# Patient Record
Sex: Female | Born: 1950 | Race: Black or African American | Hispanic: No | Marital: Married | Smoking: Never smoker
Health system: Southern US, Community
[De-identification: ages and names within clinical notes are randomized; demographics above are authoritative.]

## PROBLEM LIST (undated history)

## (undated) DIAGNOSIS — Z87442 Personal history of urinary calculi: Secondary | ICD-10-CM

## (undated) DIAGNOSIS — Z972 Presence of dental prosthetic device (complete) (partial): Secondary | ICD-10-CM

## (undated) DIAGNOSIS — K219 Gastro-esophageal reflux disease without esophagitis: Secondary | ICD-10-CM

## (undated) DIAGNOSIS — J189 Pneumonia, unspecified organism: Secondary | ICD-10-CM

## (undated) DIAGNOSIS — N2889 Other specified disorders of kidney and ureter: Secondary | ICD-10-CM

## (undated) DIAGNOSIS — E669 Obesity, unspecified: Secondary | ICD-10-CM

## (undated) DIAGNOSIS — E119 Type 2 diabetes mellitus without complications: Secondary | ICD-10-CM

## (undated) DIAGNOSIS — I10 Essential (primary) hypertension: Secondary | ICD-10-CM

## (undated) DIAGNOSIS — Z889 Allergy status to unspecified drugs, medicaments and biological substances status: Secondary | ICD-10-CM

## (undated) DIAGNOSIS — Z973 Presence of spectacles and contact lenses: Secondary | ICD-10-CM

## (undated) DIAGNOSIS — N201 Calculus of ureter: Secondary | ICD-10-CM

## (undated) DIAGNOSIS — C642 Malignant neoplasm of left kidney, except renal pelvis: Secondary | ICD-10-CM

## (undated) DIAGNOSIS — C801 Malignant (primary) neoplasm, unspecified: Secondary | ICD-10-CM

## (undated) DIAGNOSIS — M1990 Unspecified osteoarthritis, unspecified site: Secondary | ICD-10-CM

## (undated) DIAGNOSIS — D649 Anemia, unspecified: Secondary | ICD-10-CM

---

## 1979-06-24 HISTORY — PX: TOTAL ABDOMINAL HYSTERECTOMY W/ BILATERAL SALPINGOOPHORECTOMY: SHX83

## 1998-11-08 ENCOUNTER — Emergency Department (HOSPITAL_COMMUNITY): Admission: EM | Admit: 1998-11-08 | Discharge: 1998-11-08 | Payer: Self-pay | Admitting: Emergency Medicine

## 2001-12-04 ENCOUNTER — Encounter: Payer: Self-pay | Admitting: Internal Medicine

## 2001-12-04 ENCOUNTER — Encounter: Admission: RE | Admit: 2001-12-04 | Discharge: 2001-12-04 | Payer: Self-pay | Admitting: Internal Medicine

## 2001-12-25 ENCOUNTER — Other Ambulatory Visit: Admission: RE | Admit: 2001-12-25 | Discharge: 2001-12-25 | Payer: Self-pay | Admitting: Internal Medicine

## 2004-01-26 ENCOUNTER — Emergency Department (HOSPITAL_COMMUNITY): Admission: EM | Admit: 2004-01-26 | Discharge: 2004-01-26 | Payer: Self-pay | Admitting: Emergency Medicine

## 2004-04-15 ENCOUNTER — Observation Stay (HOSPITAL_COMMUNITY): Admission: AC | Admit: 2004-04-15 | Discharge: 2004-04-16 | Payer: Self-pay

## 2004-05-03 ENCOUNTER — Emergency Department (HOSPITAL_COMMUNITY): Admission: EM | Admit: 2004-05-03 | Discharge: 2004-05-04 | Payer: Self-pay | Admitting: Emergency Medicine

## 2005-11-06 ENCOUNTER — Emergency Department (HOSPITAL_COMMUNITY): Admission: EM | Admit: 2005-11-06 | Discharge: 2005-11-07 | Payer: Self-pay | Admitting: Emergency Medicine

## 2008-01-27 ENCOUNTER — Emergency Department (HOSPITAL_COMMUNITY): Admission: EM | Admit: 2008-01-27 | Discharge: 2008-01-27 | Payer: Self-pay | Admitting: Family Medicine

## 2008-11-22 ENCOUNTER — Emergency Department (HOSPITAL_COMMUNITY): Admission: EM | Admit: 2008-11-22 | Discharge: 2008-11-22 | Payer: Self-pay | Admitting: Emergency Medicine

## 2008-12-02 ENCOUNTER — Emergency Department (HOSPITAL_COMMUNITY): Admission: EM | Admit: 2008-12-02 | Discharge: 2008-12-02 | Payer: Self-pay | Admitting: Emergency Medicine

## 2009-02-08 ENCOUNTER — Encounter: Admission: RE | Admit: 2009-02-08 | Discharge: 2009-02-08 | Payer: Self-pay | Admitting: Internal Medicine

## 2009-10-20 ENCOUNTER — Encounter: Admission: RE | Admit: 2009-10-20 | Discharge: 2009-10-20 | Payer: Self-pay | Admitting: Internal Medicine

## 2010-02-28 ENCOUNTER — Emergency Department (HOSPITAL_COMMUNITY): Admission: EM | Admit: 2010-02-28 | Discharge: 2010-02-28 | Payer: Self-pay | Admitting: Emergency Medicine

## 2010-03-08 ENCOUNTER — Emergency Department (HOSPITAL_COMMUNITY): Admission: EM | Admit: 2010-03-08 | Discharge: 2010-03-08 | Payer: Self-pay | Admitting: Family Medicine

## 2010-11-11 ENCOUNTER — Emergency Department (HOSPITAL_COMMUNITY)
Admission: EM | Admit: 2010-11-11 | Discharge: 2010-11-11 | Payer: Self-pay | Source: Home / Self Care | Admitting: Emergency Medicine

## 2011-02-07 LAB — POCT I-STAT, CHEM 8
BUN: 23 mg/dL (ref 6–23)
Creatinine, Ser: 1 mg/dL (ref 0.4–1.2)
Glucose, Bld: 155 mg/dL — ABNORMAL HIGH (ref 70–99)
Potassium: 4.1 mEq/L (ref 3.5–5.1)
Sodium: 134 mEq/L — ABNORMAL LOW (ref 135–145)

## 2013-07-12 ENCOUNTER — Emergency Department (HOSPITAL_COMMUNITY): Payer: Self-pay

## 2013-07-12 ENCOUNTER — Emergency Department (HOSPITAL_COMMUNITY)
Admission: EM | Admit: 2013-07-12 | Discharge: 2013-07-12 | Disposition: A | Discharge status: Home / Self Care | Payer: Self-pay | Attending: Emergency Medicine | Admitting: Emergency Medicine

## 2013-07-12 ENCOUNTER — Encounter (HOSPITAL_COMMUNITY): Payer: Self-pay | Admitting: Emergency Medicine

## 2013-07-12 DIAGNOSIS — R112 Nausea with vomiting, unspecified: Secondary | ICD-10-CM | POA: Insufficient documentation

## 2013-07-12 DIAGNOSIS — N200 Calculus of kidney: Secondary | ICD-10-CM | POA: Insufficient documentation

## 2013-07-12 LAB — COMPREHENSIVE METABOLIC PANEL
Alkaline Phosphatase: 80 U/L (ref 39–117)
BUN: 23 mg/dL (ref 6–23)
Calcium: 9.4 mg/dL (ref 8.4–10.5)
Creatinine, Ser: 0.96 mg/dL (ref 0.50–1.10)
GFR calc Af Amer: 71 mL/min — ABNORMAL LOW (ref >90)
Glucose, Bld: 200 mg/dL — ABNORMAL HIGH (ref 70–99)
Total Protein: 8 g/dL (ref 6.0–8.3)

## 2013-07-12 LAB — CBC WITH DIFFERENTIAL/PLATELET
Basophils Relative: 0 % (ref 0–1)
Eosinophils Absolute: 0.1 10*3/uL (ref 0.0–0.7)
Eosinophils Relative: 0 % (ref 0–5)
Hemoglobin: 11.5 g/dL — ABNORMAL LOW (ref 12.0–15.0)
Lymphs Abs: 2 10*3/uL (ref 0.7–4.0)
MCH: 28.1 pg (ref 26.0–34.0)
MCHC: 32.6 g/dL (ref 30.0–36.0)
MCV: 86.3 fL (ref 78.0–100.0)
Monocytes Absolute: 0.4 10*3/uL (ref 0.1–1.0)
Monocytes Relative: 2 % — ABNORMAL LOW (ref 3–12)
RBC: 4.09 MIL/uL (ref 3.87–5.11)

## 2013-07-12 LAB — URINALYSIS, ROUTINE W REFLEX MICROSCOPIC
Bilirubin Urine: NEGATIVE
Leukocytes, UA: NEGATIVE
Nitrite: NEGATIVE
Specific Gravity, Urine: 1.028 (ref 1.005–1.030)
Urobilinogen, UA: 0.2 mg/dL (ref 0.0–1.0)

## 2013-07-12 LAB — LIPASE, BLOOD: Lipase: 49 U/L (ref 11–59)

## 2013-07-12 LAB — URINE MICROSCOPIC-ADD ON

## 2013-07-12 MED ORDER — NAPROXEN 500 MG PO TABS: 500.0000 mg | ORAL_TABLET | Freq: Two times a day (BID) | ORAL | Status: DC

## 2013-07-12 MED ORDER — KETOROLAC TROMETHAMINE 30 MG/ML IJ SOLN
INTRAMUSCULAR | Status: AC
  Filled 2013-07-12: qty 1

## 2013-07-12 MED ORDER — HYDROMORPHONE HCL PF 1 MG/ML IJ SOLN
1.0000 mg | Freq: Once | INTRAMUSCULAR | Status: AC
  Administered 2013-07-12: 1 mg via INTRAVENOUS
  Filled 2013-07-12: qty 1

## 2013-07-12 MED ORDER — MORPHINE SULFATE 4 MG/ML IJ SOLN: 4.0000 mg | Freq: Once | INTRAMUSCULAR | Status: DC

## 2013-07-12 MED ORDER — KETOROLAC TROMETHAMINE 30 MG/ML IJ SOLN
30.0000 mg | Freq: Once | INTRAMUSCULAR | Status: AC
  Administered 2013-07-12: 30 mg via INTRAVENOUS

## 2013-07-12 MED ORDER — ONDANSETRON 8 MG PO TBDP
8.0000 mg | ORAL_TABLET | Freq: Once | ORAL | Status: AC
  Administered 2013-07-12: 8 mg via ORAL
  Filled 2013-07-12: qty 1

## 2013-07-12 MED ORDER — ONDANSETRON HCL 4 MG/2ML IJ SOLN
4.0000 mg | Freq: Once | INTRAMUSCULAR | Status: AC
  Administered 2013-07-12: 4 mg via INTRAVENOUS

## 2013-07-12 MED ORDER — PROMETHAZINE HCL 25 MG PO TABS: 25.0000 mg | ORAL_TABLET | Freq: Four times a day (QID) | ORAL | Status: DC | PRN

## 2013-07-12 MED ORDER — MORPHINE SULFATE 4 MG/ML IJ SOLN
INTRAMUSCULAR | Status: AC
  Filled 2013-07-12: qty 1

## 2013-07-12 MED ORDER — SODIUM CHLORIDE 0.9 % IV BOLUS (SEPSIS)
1000.0000 mL | Freq: Once | INTRAVENOUS | Status: AC
  Administered 2013-07-12: 1000 mL via INTRAVENOUS

## 2013-07-12 MED ORDER — SIMETHICONE 40 MG/0.6ML PO SUSP (UNIT DOSE)
40.0000 mg | Freq: Once | ORAL | Status: AC
  Administered 2013-07-12: 40 mg via ORAL
  Filled 2013-07-12: qty 0.6

## 2013-07-12 MED ORDER — HYDROCODONE-ACETAMINOPHEN 5-325 MG PO TABS: 2.0000 | ORAL_TABLET | ORAL | Status: DC | PRN

## 2013-07-12 MED ORDER — ONDANSETRON HCL 4 MG/2ML IJ SOLN
INTRAMUSCULAR | Status: AC
  Filled 2013-07-12: qty 2

## 2013-07-12 MED ORDER — MORPHINE SULFATE 4 MG/ML IJ SOLN
4.0000 mg | Freq: Once | INTRAMUSCULAR | Status: AC
  Administered 2013-07-12: 4 mg via INTRAVENOUS
  Filled 2013-07-12: qty 1

## 2013-07-12 NOTE — ED Provider Notes (Signed)
Medical screening examination/treatment/procedure(s) were performed by non-physician practitioner and as supervising physician I was immediately available for consultation/collaboration.  Olivia Mackie, MD 07/12/13 [phone]

## 2013-07-12 NOTE — ED Notes (Signed)
Pt placed on 3L O2 and sat upright.  Pt saturation level at 100%

## 2013-07-12 NOTE — ED Notes (Signed)
Pt states that at 2200 she began vomiting and having bilateral LQ ABD pain. Pt believes it is gas. Pt actively vomiting during triage.

## 2013-07-12 NOTE — ED Provider Notes (Signed)
CSN: [id number]     Arrival date & time 07/12/13  0121 History   First MD Initiated Contact with Patient 07/12/13 0135     Chief Complaint  Patient presents with  . Emesis  . Abdominal Pain   (Consider location/radiation/quality/duration/timing/severity/associated sxs/prior Treatment) HPI Comments: Patient is a 62 year old female with a past medical history of diabetes and hypertension who presents with abdominal pain for the past few hours. The pain is located in her right flank and does not radiate. The pain is described as aching and severe. Patient states this feels like her gas pain but much worse. The pain started gradually and progressively worsened since the onset. No alleviating/aggravating factors. The patient has tried nothing for symptoms without relief. Associated symptoms include nausea and vomiting. Patient denies fever, headache, diarrhea, chest pain, SOB, dysuria, constipation, abnormal vaginal bleeding/discharge.      History reviewed. No pertinent past medical history. Past Surgical History  Procedure Laterality Date  . Abdominal hysterectomy    . Cesarean section     No family history on file. History  Substance Use Topics  . Smoking status: Never Smoker   . Smokeless tobacco: Never Used  . Alcohol Use: No   OB History   Grav Para Term Preterm Abortions TAB SAB Ect Mult Living                 Review of Systems  Gastrointestinal: Positive for nausea, vomiting and abdominal pain.  All other systems reviewed and are negative.    Allergies  Review of patient's allergies indicates no known allergies.  Home Medications  No current outpatient prescriptions on file. BP 196/74  Pulse 69  Temp(Src) 97.7 F (36.5 C) (Oral)  Resp 20  SpO2 95% Physical Exam  Nursing note and vitals reviewed. Constitutional: She is oriented to person, place, and time. She appears well-developed and well-nourished. No distress.  HENT:  Head: Normocephalic and atraumatic.   Eyes: Conjunctivae are normal. Pupils are equal, round, and reactive to light.  Neck: Normal range of motion.  Cardiovascular: Normal rate and regular rhythm.  Exam reveals no gallop and no friction rub.   No murmur heard. Pulmonary/Chest: Effort normal and breath sounds normal. She has no wheezes. She has no rales. She exhibits no tenderness.  Abdominal: Soft. She exhibits no distension. There is tenderness. There is no rebound and no guarding.  Right side abdominal tenderness to palpation. No focal tenderness to palpation or peritoneal signs.   Musculoskeletal: Normal range of motion.  Neurological: She is alert and oriented to person, place, and time. Coordination normal.  Speech is goal-oriented. Moves limbs without ataxia.   Skin: Skin is warm and dry.  Psychiatric: She has a normal mood and affect. Her behavior is normal.    ED Course  Procedures (including critical care time) Labs Review Labs Reviewed  CBC WITH DIFFERENTIAL - Abnormal; Notable for the following:    WBC 16.2 (*)    Hemoglobin 11.5 (*)    HCT 35.3 (*)    Neutrophils Relative % 85 (*)    Neutro Abs 13.8 (*)    Monocytes Relative 2 (*)    All other components within normal limits  COMPREHENSIVE METABOLIC PANEL - Abnormal; Notable for the following:    Sodium 132 (*)    Glucose, Bld 200 (*)    Total Bilirubin 0.2 (*)    GFR calc non Af Amer 62 (*)    GFR calc Af Amer 71 (*)  All other components within normal limits  URINALYSIS, ROUTINE W REFLEX MICROSCOPIC - Abnormal; Notable for the following:    APPearance CLOUDY (*)    Hgb urine dipstick LARGE (*)    All other components within normal limits  URINE MICROSCOPIC-ADD ON - Abnormal; Notable for the following:    Crystals URIC ACID CRYSTALS (*)    All other components within normal limits  URINE CULTURE  LIPASE, BLOOD   Imaging Review Ct Abdomen Pelvis Wo Contrast  07/12/2013   CLINICAL DATA:  Vomiting, lower abdominal pain.  EXAM: CT ABDOMEN AND  PELVIS WITHOUT CONTRAST  TECHNIQUE: Multidetector CT imaging of the abdomen and pelvis was performed following the standard protocol without intravenous contrast.  COMPARISON:  11/06/2005  FINDINGS: Coarse atelectasis or scarring posteriorly in the visualized lung bases. Unremarkable noncontrast assessment of liver, gallbladder, spleen, bilateral adrenal glands, pancreas, left kidney.  There is right hydronephrosis and ureterectasis down to the level of the ureteral orifice with no radiodense calculus or mass evident. The urinary bladder is incompletely distended without calculus. There are mild streaky inflammatory/edematous changes around the right kidney and renal collecting system and proximal ureter.  Stomach, small bowel, and colon are nondilated. No ascites. No free air. No adenopathy. Lumbar spine intact. Small umbilical hernia containing only mesenteric fat.  IMPRESSION: Right hydronephrosis and ureterectasis without calculus suggesting recent stone passage versus radiolucent obstructing calculus near the ureteral orifice. .   Electronically Signed   By: Oley Balm M.D.   On: 07/12/2013 05:19   Dg Abd Acute W/chest  07/12/2013   *RADIOLOGY REPORT*  Clinical Data: Evaluate for obstruction  ACUTE ABDOMEN SERIES (ABDOMEN 2 VIEW & CHEST 1 VIEW)  Comparison: Prior CT from 11/06/2005.  Findings: Cardiac and mediastinal silhouettes within normal limits.  Lungs are normally inflated.  Minimal patchy opacity overlying the left lung base may represent atelectasis or infiltrate.  No pulmonary edema or pleural effusion.  No pneumothorax.  Gas and stool are seen scattered within a few nondilated loops of bowel.  There is no bowel obstruction or ileus. No free intraperitoneal air.  No soft tissue mass or calcifications.  No acute osseous abnormality.  IMPRESSION: 1.  No radiographic evidence of bowel obstruction. 2.  Patchy left basilar opacity.  Atelectasis is favored.   Original Report Authenticated By:  Rise Mu, M.D.    MDM   1. Kidney stone on right side     1:36 AM Labs and urinalysis pending. Vitals stable and patient afebrile. Patient will have fluids, morphine and zofran. Acute abdominal series pending.   4:49 AM Labs show elevated WBC. Urinalysis shows large hemoglobin. I am suspicious for kidney stone at this point. Patient will have CT without contrast to rule out kidney stone. Patient continuing to have pain medication for pain control.   5:25 AM Patient has a kidney stone based on CT findings. Patient will have toradol for pain control. Patient will be discharged with pain and nausea medication with instructions to return with worsening or concerning symptoms. Vitals stable and patient afebrile.     Debbie Velasquez, [place] 07/12/13 [phone]

## 2013-07-13 LAB — URINE CULTURE: Colony Count: NO GROWTH

## 2014-01-11 ENCOUNTER — Emergency Department (HOSPITAL_COMMUNITY)
Admission: EM | Admit: 2014-01-11 | Discharge: 2014-01-12 | Disposition: A | Discharge status: Home / Self Care | Payer: Self-pay | Attending: Emergency Medicine | Admitting: Emergency Medicine

## 2014-01-11 ENCOUNTER — Encounter (HOSPITAL_COMMUNITY): Payer: Self-pay | Admitting: Emergency Medicine

## 2014-01-11 DIAGNOSIS — R209 Unspecified disturbances of skin sensation: Secondary | ICD-10-CM | POA: Insufficient documentation

## 2014-01-11 DIAGNOSIS — R609 Edema, unspecified: Secondary | ICD-10-CM | POA: Insufficient documentation

## 2014-01-11 DIAGNOSIS — Z79899 Other long term (current) drug therapy: Secondary | ICD-10-CM | POA: Insufficient documentation

## 2014-01-11 DIAGNOSIS — E119 Type 2 diabetes mellitus without complications: Secondary | ICD-10-CM | POA: Insufficient documentation

## 2014-01-11 DIAGNOSIS — I10 Essential (primary) hypertension: Secondary | ICD-10-CM | POA: Insufficient documentation

## 2014-01-11 HISTORY — DX: Essential (primary) hypertension: I10

## 2014-01-11 LAB — BASIC METABOLIC PANEL
BUN: 20 mg/dL (ref 6–23)
CHLORIDE: 102 meq/L (ref 96–112)
CO2: 29 meq/L (ref 19–32)
CREATININE: 1.11 mg/dL — AB (ref 0.50–1.10)
Calcium: 9.7 mg/dL (ref 8.4–10.5)
GFR calc Af Amer: 59 mL/min — ABNORMAL LOW (ref >90)
GFR calc non Af Amer: 51 mL/min — ABNORMAL LOW (ref >90)
Glucose, Bld: 145 mg/dL — ABNORMAL HIGH (ref 70–99)
Potassium: 3.9 mEq/L (ref 3.7–5.3)
Sodium: 143 mEq/L (ref 137–147)

## 2014-01-11 LAB — CBG MONITORING, ED: Glucose-Capillary: 138 mg/dL — ABNORMAL HIGH (ref 70–99)

## 2014-01-11 LAB — D-DIMER, QUANTITATIVE: D-Dimer, Quant: 0.36 ug/mL-FEU (ref 0.00–0.48)

## 2014-01-11 LAB — CBC
HEMATOCRIT: 33.8 % — AB (ref 36.0–46.0)
Hemoglobin: 10.7 g/dL — ABNORMAL LOW (ref 12.0–15.0)
MCH: 28 pg (ref 26.0–34.0)
MCHC: 31.7 g/dL (ref 30.0–36.0)
MCV: 88.5 fL (ref 78.0–100.0)
Platelets: 364 10*3/uL (ref 150–400)
RBC: 3.82 MIL/uL — ABNORMAL LOW (ref 3.87–5.11)
RDW: 13.1 % (ref 11.5–15.5)
WBC: 10.8 10*3/uL — ABNORMAL HIGH (ref 4.0–10.5)

## 2014-01-11 NOTE — ED Notes (Signed)
Pt ambulatory to exam room with steady gait.  

## 2014-01-11 NOTE — ED Notes (Signed)
Patient states she noticed slight swelling to her R leg and foot yesterday, states today it became more noticeable.

## 2014-01-11 NOTE — ED Notes (Signed)
MD at bedside. 

## 2014-01-11 NOTE — ED Notes (Signed)
Pharmacy at bedside

## 2014-01-12 NOTE — Discharge Instructions (Signed)

## 2014-01-12 NOTE — ED Provider Notes (Signed)
CSN: [id number]     Arrival date & time 01/11/14  2047 History   First MD Initiated Contact with Patient 01/11/14 2306     Chief Complaint  Patient presents with  . Leg Swelling    Right     (Consider location/radiation/quality/duration/timing/severity/associated sxs/prior Treatment) HPI Patient presents 1 day right lower lid and foot swelling. She states the extremity feels tight. She's had intermittent pins and needles sensation for the past few weeks. She denies any recent immobilization specifically recent surgeries or extended travel. She has been standing on her feet more than normal lately. She denies any chest pain or shortness of breath. She's had no fever or chills. She has no skin changes Past Medical History  Diagnosis Date  . Diabetes mellitus without complication   . Hypertension    Past Surgical History  Procedure Laterality Date  . Abdominal hysterectomy    . Cesarean section     No family history on file. History  Substance Use Topics  . Smoking status: Never Smoker   . Smokeless tobacco: Never Used  . Alcohol Use: No   OB History   Grav Para Term Preterm Abortions TAB SAB Ect Mult Living                 Review of Systems  Constitutional: Negative for fever and chills.  Respiratory: Negative for cough and shortness of breath.   Cardiovascular: Positive for leg swelling. Negative for chest pain.  Gastrointestinal: Negative for nausea, vomiting and abdominal pain.  Musculoskeletal: Positive for myalgias. Negative for back pain, neck pain and neck stiffness.  Skin: Negative for rash and wound.  Neurological: Negative for dizziness, weakness, light-headedness and numbness.  All other systems reviewed and are negative.      Allergies  Review of patient's allergies indicates no known allergies.  Home Medications   Current Outpatient Rx  Name  Route  Sig  Dispense  Refill  . acetaminophen (TYLENOL) 500 MG tablet   Oral   Take 1,000 mg by mouth every  6 (six) hours as needed for mild pain or headache.         . losartan (COZAAR) 25 MG tablet   Oral   Take 25 mg by mouth daily.         . metFORMIN (GLUCOPHAGE) 500 MG tablet   Oral   Take 500 mg by mouth 2 (two) times daily with a meal.          BP 160/71  Pulse 81  Temp(Src) 98.3 F (36.8 C) (Oral)  Resp 19  Ht 5\' 4"  (1.626 m)  Wt 240 lb (108.863 kg)  BMI 41.18 kg/m2  SpO2 97% Physical Exam  Nursing note and vitals reviewed. Constitutional: She is oriented to person, place, and time. She appears well-developed and well-nourished. No distress.  HENT:  Head: Normocephalic and atraumatic.  Mouth/Throat: Oropharynx is clear and moist.  Eyes: EOM are normal. Pupils are equal, round, and reactive to light.  Neck: Normal range of motion. Neck supple.  Cardiovascular: Normal rate and regular rhythm.   Pulmonary/Chest: Effort normal and breath sounds normal. No respiratory distress. She has no wheezes. She has no rales. She exhibits no tenderness.  Abdominal: Soft. Bowel sounds are normal. She exhibits no distension and no mass. There is no tenderness. There is no rebound and no guarding.  Musculoskeletal: Normal range of motion. She exhibits tenderness. She exhibits no edema.  Mild tenderness to palpation over the lateral tibialis muscles on the right.  Patient has no calf tenderness. 2+ dorsalis pedis pulses bilaterally. Patient has mild pitting edema to bilateral lower extremities.  Neurological: She is alert and oriented to person, place, and time.  Moves all extremities without deficit. Sensation is intact.  Skin: Skin is warm and dry. No rash noted. No erythema.  Psychiatric: She has a normal mood and affect. Her behavior is normal.    ED Course  Procedures (including critical care time) Labs Review Labs Reviewed  BASIC METABOLIC PANEL - Abnormal; Notable for the following:    Glucose, Bld 145 (*)    Creatinine, Ser 1.11 (*)    GFR calc non Af Amer 51 (*)    GFR  calc Af Amer 59 (*)    All other components within normal limits  CBC - Abnormal; Notable for the following:    WBC 10.8 (*)    RBC 3.82 (*)    Hemoglobin 10.7 (*)    HCT 33.8 (*)    All other components within normal limits  CBG MONITORING, ED - Abnormal; Notable for the following:    Glucose-Capillary 138 (*)    All other components within normal limits  D-DIMER, QUANTITATIVE   Imaging Review No results found.   EKG Interpretation None      MDM   Final diagnoses:  None    Patient has a normal d-dimer without significant respiratory for DVT. I suspect her symptoms are due to peripheral edema from increased standing. I have offered Doppler ultrasound all of the extremity but the patient is comfortable going home and treating with compression hose and elevation. Patient has been given thorough return cautions including instructions to return for increased pain or swelling, chest pain or shortness of breath or for any concerns    Julianne Rice, MD 01/12/14 [phone]

## 2014-05-12 ENCOUNTER — Encounter (HOSPITAL_BASED_OUTPATIENT_CLINIC_OR_DEPARTMENT_OTHER): Payer: Self-pay | Admitting: Emergency Medicine

## 2014-05-12 ENCOUNTER — Emergency Department (HOSPITAL_BASED_OUTPATIENT_CLINIC_OR_DEPARTMENT_OTHER)
Admission: EM | Admit: 2014-05-12 | Discharge: 2014-05-12 | Disposition: A | Discharge status: Home / Self Care | Payer: Self-pay | Attending: Emergency Medicine | Admitting: Emergency Medicine

## 2014-05-12 DIAGNOSIS — L509 Urticaria, unspecified: Secondary | ICD-10-CM | POA: Insufficient documentation

## 2014-05-12 DIAGNOSIS — Z79899 Other long term (current) drug therapy: Secondary | ICD-10-CM | POA: Insufficient documentation

## 2014-05-12 DIAGNOSIS — I10 Essential (primary) hypertension: Secondary | ICD-10-CM | POA: Insufficient documentation

## 2014-05-12 DIAGNOSIS — E119 Type 2 diabetes mellitus without complications: Secondary | ICD-10-CM | POA: Insufficient documentation

## 2014-05-12 MED ORDER — HYDROXYZINE HCL 25 MG PO TABS
25.0000 mg | ORAL_TABLET | Freq: Once | ORAL | Status: AC
  Administered 2014-05-12: 25 mg via ORAL
  Filled 2014-05-12: qty 1

## 2014-05-12 MED ORDER — HYDROXYZINE HCL 25 MG PO TABS: 25.0000 mg | ORAL_TABLET | Freq: Four times a day (QID) | ORAL | Status: DC | PRN

## 2014-05-12 MED ORDER — FAMOTIDINE 20 MG PO TABS
20.0000 mg | ORAL_TABLET | Freq: Once | ORAL | Status: AC
  Administered 2014-05-12: 20 mg via ORAL
  Filled 2014-05-12: qty 1

## 2014-05-12 NOTE — Discharge Instructions (Signed)
Hives Hives are itchy, red, swollen areas of the skin. They can vary in size and location on your body. Hives can come and go for hours or several days (acute hives) or for several weeks (chronic hives). Hives do not spread from person to person (noncontagious). They may get worse with scratching, exercise, and emotional stress. CAUSES   Allergic reaction to food, additives, or drugs.  Infections, including the common cold.  Illness, such as vasculitis, lupus, or thyroid disease.  Exposure to sunlight, heat, or cold.  Exercise.  Stress.  Contact with chemicals. SYMPTOMS   Red or white swollen patches on the skin. The patches may change size, shape, and location quickly and repeatedly.  Itching.  Swelling of the hands, feet, and face. This may occur if hives develop deeper in the skin. DIAGNOSIS  Your caregiver can usually tell what is wrong by performing a physical exam. Skin or blood tests may also be done to determine the cause of your hives. In some cases, the cause cannot be determined. TREATMENT  Mild cases usually get better with medicines such as antihistamines. Severe cases may require an emergency epinephrine injection. If the cause of your hives is known, treatment includes avoiding that trigger.  HOME CARE INSTRUCTIONS   Avoid causes that trigger your hives.  Take antihistamines as directed by your caregiver to reduce the severity of your hives. Non-sedating or low-sedating antihistamines are usually recommended. Do not drive while taking an antihistamine.  Take any other medicines prescribed for itching as directed by your caregiver.  Wear loose-fitting clothing.  Keep all follow-up appointments as directed by your caregiver. SEEK MEDICAL CARE IF:   You have persistent or severe itching that is not relieved with medicine.  You have painful or swollen joints. SEEK IMMEDIATE MEDICAL CARE IF:   You have a fever.  Your tongue or lips are swollen.  You have  trouble breathing or swallowing.  You feel tightness in the throat or chest.  You have abdominal pain. These problems may be the first sign of a life-threatening allergic reaction. Call your local emergency services (911 in [place] MAKE SURE YOU:   Understand these instructions.  Will watch your condition.  Will get help right away if you are not doing well or get worse. Document Released: 10/09/2005 Document Revised: 10/14/2013 Document Reviewed: 01/02/2012 Exit[employer] Patient Information 2015 [employer]. This information is not intended to replace advice given to you by your health care provider. Make sure you discuss any questions you have with your health care provider.  

## 2014-05-12 NOTE — ED Notes (Signed)
Hives since yesterday. No respiratory distress. Steroid injection and Benadryl injection yesterday at [place].

## 2014-05-12 NOTE — ED Provider Notes (Signed)
CSN: [id number]     Arrival date & time 05/12/14  2224 History  This chart was scribed for Wynetta Fines, MD by Delphia Grates, ED Scribe. This patient was seen in room MH12/MH12 and the patient's care was started at 11:34 PM.    Chief Complaint  Patient presents with  . Urticaria     The history is provided by the patient. No language interpreter was used.    HPI Comments: Debbie Velasquez is a 63 y.o. female who presents to the Emergency Department complaining of generalized urticaria onset yesterday. Patient states that she was out eating when she started itching. She states she did not eat anything unfamiliar. Patient was seen at [facility] and was given a steroid injection, a Benadryl injection and was advised to take Claritin BID. She worsened overnight and was seen again at [facility] today. She was prescribed triamcinolone 0.1% BID. She has used this without improvement. She denies fever, SOB, throat/tongue swelling, nausea, vomiting or diarrhea. She describes her itching as moderate to sever   Past Medical History  Diagnosis Date  . Diabetes mellitus without complication   . Hypertension    Past Surgical History  Procedure Laterality Date  . Abdominal hysterectomy    . Cesarean section     No family history on file. History  Substance Use Topics  . Smoking status: Never Smoker   . Smokeless tobacco: Never Used  . Alcohol Use: No   OB History   Grav Para Term Preterm Abortions TAB SAB Ect Mult Living                 Review of Systems A complete 10 system review of systems was obtained and all systems are negative except as noted in the HPI and PMH.     Allergies  Review of patient's allergies indicates no known allergies.  Home Medications   Prior to Admission medications   Medication Sig Start Date End Date Taking? Authorizing Provider  acetaminophen (TYLENOL) 500 MG tablet Take 1,000 mg by mouth every 6 (six) hours as needed for mild pain or headache.     Historical Provider, MD  hydrOXYzine (ATARAX/VISTARIL) 25 MG tablet Take 1-2 tablets (25-50 mg total) by mouth every 6 (six) hours as needed for itching (may cause drowsiness). 05/12/14   Karen Chafe Genevia Bouldin, MD  losartan (COZAAR) 25 MG tablet Take 25 mg by mouth daily.    Historical Provider, MD  metFORMIN (GLUCOPHAGE) 500 MG tablet Take 500 mg by mouth 2 (two) times daily with a meal.    Historical Provider, MD   BP 155/93  Pulse 98  Temp(Src) 98.2 F (36.8 C) (Oral)  Resp 20  Ht 5\' 4"  (1.626 m)  Wt 246 lb (111.585 kg)  BMI 42.21 kg/m2  SpO2 98%  Physical Exam  Nursing note and vitals reviewed.  General: Well-developed, well-nourished female in no acute distress; appearance consistent with age of record HENT: normocephalic; atraumatic. No oral edema. No dysphonia. Eyes: pupils equal, round and reactive to light; extraocular muscles intact. Arcus senilis bilaterally. Neck: supple Heart: regular rate and rhythm Lungs: clear to auscultation bilaterally Abdomen: soft; nondistended; nontender; bowel sounds present Extremities: No deformity; full range of motion; pulses normal; no edema Neurologic: Awake, alert and oriented; motor function intact in all extremities and symmetric; no facial droop Skin: Warm and dry. Generalized urticarial rash. Psychiatric: Normal mood and affect   ED Course  Procedures (including critical care time)  DIAGNOSTIC STUDIES: Oxygen Saturation is 98%  on room air, normal by my interpretation.    COORDINATION OF CARE: 11:40 PM- Pt advised of plan for treatment and pt agrees.  Patient advised that hydroxyzine can cause significant drowsiness and should not be taken if driving or performing other potentially hazardous tasks.   MDM   Final diagnoses:  Urticaria of entire body   I personally performed the services described in this documentation, which was scribed in my presence. The recorded information has been reviewed and is accurate.    Wynetta Fines, MD 05/12/14 2351

## 2014-12-01 DIAGNOSIS — Z1239 Encounter for other screening for malignant neoplasm of breast: Secondary | ICD-10-CM | POA: Diagnosis not present

## 2014-12-01 DIAGNOSIS — I10 Essential (primary) hypertension: Secondary | ICD-10-CM | POA: Diagnosis not present

## 2014-12-01 DIAGNOSIS — Z1211 Encounter for screening for malignant neoplasm of colon: Secondary | ICD-10-CM | POA: Diagnosis not present

## 2014-12-01 DIAGNOSIS — E119 Type 2 diabetes mellitus without complications: Secondary | ICD-10-CM | POA: Diagnosis not present

## 2014-12-01 DIAGNOSIS — Z13220 Encounter for screening for lipoid disorders: Secondary | ICD-10-CM | POA: Diagnosis not present

## 2014-12-02 ENCOUNTER — Other Ambulatory Visit: Payer: Self-pay | Admitting: Internal Medicine

## 2014-12-02 DIAGNOSIS — E280 Estrogen excess: Secondary | ICD-10-CM

## 2014-12-03 ENCOUNTER — Other Ambulatory Visit: Payer: Self-pay | Admitting: Internal Medicine

## 2014-12-03 ENCOUNTER — Other Ambulatory Visit: Payer: Self-pay

## 2014-12-03 DIAGNOSIS — E2839 Other primary ovarian failure: Secondary | ICD-10-CM

## 2014-12-03 DIAGNOSIS — Z1231 Encounter for screening mammogram for malignant neoplasm of breast: Secondary | ICD-10-CM

## 2014-12-10 ENCOUNTER — Ambulatory Visit
Admission: RE | Admit: 2014-12-10 | Discharge: 2014-12-10 | Disposition: A | Discharge status: Home / Self Care | Payer: Medicare Other | Source: Ambulatory Visit | Attending: Internal Medicine | Admitting: Internal Medicine

## 2014-12-10 ENCOUNTER — Ambulatory Visit
Admission: RE | Admit: 2014-12-10 | Discharge: 2014-12-10 | Disposition: A | Discharge status: Home / Self Care | Payer: Medicare Other | Source: Ambulatory Visit

## 2014-12-10 DIAGNOSIS — Z780 Asymptomatic menopausal state: Secondary | ICD-10-CM | POA: Diagnosis not present

## 2014-12-10 DIAGNOSIS — E2839 Other primary ovarian failure: Secondary | ICD-10-CM

## 2014-12-10 DIAGNOSIS — Z1231 Encounter for screening mammogram for malignant neoplasm of breast: Secondary | ICD-10-CM | POA: Diagnosis not present

## 2014-12-15 DIAGNOSIS — H40013 Open angle with borderline findings, low risk, bilateral: Secondary | ICD-10-CM | POA: Diagnosis not present

## 2014-12-15 DIAGNOSIS — H2513 Age-related nuclear cataract, bilateral: Secondary | ICD-10-CM | POA: Diagnosis not present

## 2014-12-15 DIAGNOSIS — E119 Type 2 diabetes mellitus without complications: Secondary | ICD-10-CM | POA: Diagnosis not present

## 2015-02-02 ENCOUNTER — Ambulatory Visit (AMBULATORY_SURGERY_CENTER): Payer: Self-pay | Admitting: *Deleted

## 2015-02-02 VITALS — Ht 64.0 in | Wt 254.2 lb

## 2015-02-02 DIAGNOSIS — Z1211 Encounter for screening for malignant neoplasm of colon: Secondary | ICD-10-CM

## 2015-02-02 NOTE — Progress Notes (Signed)
No egg or soy allergy  No anesthesia or intubation problems per pt  No diet medications taken  Registered in EMMI   

## 2015-02-16 ENCOUNTER — Encounter: Payer: Medicare Other | Admitting: Internal Medicine

## 2015-02-16 ENCOUNTER — Telehealth: Payer: Self-pay | Admitting: Internal Medicine

## 2015-02-16 NOTE — Telephone Encounter (Signed)
No charge. 

## 2015-02-22 ENCOUNTER — Telehealth: Payer: Self-pay | Admitting: Internal Medicine

## 2015-02-22 NOTE — Telephone Encounter (Signed)
Spoke with patient. She states ... Had to cancel colonoscopy on 02/16/15 due to dehydration, states unable to finish the 2nd half of Miralax/Gaterade this am because felt so sick, felt like I was "floating", called 911, EMS state BP low and she was dehydrated. She did not go to ER, she started drinking water and felt better. She states she did follow prep instructions and thought she was drinking enough liquids during the day to stay hydrated. Patient is rescheduled for colonoscopy on 03/10/15 at 9:30 am. Should patient do the Miralax/Dulcolax prep for this colonoscopy? Please advise, thank you. Pearlee Arvizu PV

## 2015-02-22 NOTE — Telephone Encounter (Signed)
She should see me in office given that hx. So cancel colon and schedule an OV please re: colon cancer screening and problems with prep Thanks

## 2015-02-23 NOTE — Telephone Encounter (Signed)
Called pt on her cell number and left message to call our office.

## 2015-02-24 ENCOUNTER — Encounter: Payer: Self-pay | Admitting: Internal Medicine

## 2015-03-02 DIAGNOSIS — M545 Low back pain: Secondary | ICD-10-CM | POA: Diagnosis not present

## 2015-03-02 DIAGNOSIS — E669 Obesity, unspecified: Secondary | ICD-10-CM | POA: Diagnosis not present

## 2015-03-02 DIAGNOSIS — E119 Type 2 diabetes mellitus without complications: Secondary | ICD-10-CM | POA: Diagnosis not present

## 2015-03-02 DIAGNOSIS — M25569 Pain in unspecified knee: Secondary | ICD-10-CM | POA: Diagnosis not present

## 2015-03-02 DIAGNOSIS — I10 Essential (primary) hypertension: Secondary | ICD-10-CM | POA: Diagnosis not present

## 2015-03-10 ENCOUNTER — Encounter: Payer: Medicare Other | Admitting: Internal Medicine

## 2015-04-28 ENCOUNTER — Ambulatory Visit (INDEPENDENT_AMBULATORY_CARE_PROVIDER_SITE_OTHER): Payer: Medicare Other | Admitting: Internal Medicine

## 2015-04-28 ENCOUNTER — Encounter: Payer: Self-pay | Admitting: Internal Medicine

## 2015-04-28 VITALS — BP 136/86 | HR 72 | Ht 64.0 in | Wt 250.0 lb

## 2015-04-28 DIAGNOSIS — Z1211 Encounter for screening for malignant neoplasm of colon: Secondary | ICD-10-CM | POA: Diagnosis not present

## 2015-04-28 DIAGNOSIS — E860 Dehydration: Secondary | ICD-10-CM | POA: Diagnosis not present

## 2015-04-28 NOTE — Progress Notes (Signed)
   Cc: colon cancer screening, prior problems with prep  HPI:  Had weakness and palpitations after first part of colonoscopy prep when prepping last month. Called EMS but did not go to ED. Case cancelled. Said drank prep ok but did not hydrate well before. Ready to rescehdule.  Medications, allergies, past medical history, past surgical history, family history and social history are reviewed and updated in the EMR.  PE: BP 146/90 mmHg  Pulse 72  Ht 5\' 4"  (1.626 m)  Wt 250 lb (113.399 kg)  BMI 42.89 kg/m2 NAD  A/P: Dehydration  Colon cancer screening   Reschedule colonoscopy, MiraLAx prep. Educated on hydrating well before the prep.  The risks and benefits as well as alternatives of endoscopic procedure(s) have been discussed and reviewed. All questions answered. The patient agrees to proceed.

## 2015-04-28 NOTE — Patient Instructions (Signed)
You have been scheduled for a colonoscopy. Please follow written instructions given to you at your visit today.  Please pick up your prep supplies at the pharmacy within the next 1-3 days. If you use inhalers (even only as needed), please bring them with you on the day of your procedure.   I appreciate the opportunity to care for you. Carl Gessner, MD, FACG 

## 2015-05-07 ENCOUNTER — Encounter (HOSPITAL_BASED_OUTPATIENT_CLINIC_OR_DEPARTMENT_OTHER): Payer: Self-pay | Admitting: *Deleted

## 2015-05-07 ENCOUNTER — Emergency Department (HOSPITAL_BASED_OUTPATIENT_CLINIC_OR_DEPARTMENT_OTHER): Payer: Medicare Other

## 2015-05-07 ENCOUNTER — Emergency Department (HOSPITAL_BASED_OUTPATIENT_CLINIC_OR_DEPARTMENT_OTHER)
Admission: EM | Admit: 2015-05-07 | Discharge: 2015-05-08 | Disposition: A | Discharge status: Home / Self Care | Payer: Medicare Other | Attending: Emergency Medicine | Admitting: Emergency Medicine

## 2015-05-07 DIAGNOSIS — I10 Essential (primary) hypertension: Secondary | ICD-10-CM | POA: Diagnosis not present

## 2015-05-07 DIAGNOSIS — E119 Type 2 diabetes mellitus without complications: Secondary | ICD-10-CM | POA: Insufficient documentation

## 2015-05-07 DIAGNOSIS — M25562 Pain in left knee: Secondary | ICD-10-CM | POA: Insufficient documentation

## 2015-05-07 DIAGNOSIS — Z79899 Other long term (current) drug therapy: Secondary | ICD-10-CM | POA: Diagnosis not present

## 2015-05-07 DIAGNOSIS — M1712 Unilateral primary osteoarthritis, left knee: Secondary | ICD-10-CM | POA: Diagnosis not present

## 2015-05-07 DIAGNOSIS — Z791 Long term (current) use of non-steroidal anti-inflammatories (NSAID): Secondary | ICD-10-CM | POA: Diagnosis not present

## 2015-05-07 DIAGNOSIS — Z87442 Personal history of urinary calculi: Secondary | ICD-10-CM | POA: Insufficient documentation

## 2015-05-07 DIAGNOSIS — M7122 Synovial cyst of popliteal space [Baker], left knee: Secondary | ICD-10-CM | POA: Diagnosis not present

## 2015-05-07 NOTE — ED Notes (Signed)
Pain behind her left knee. 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

## 2015-05-07 NOTE — ED Provider Notes (Signed)
CSN: [id number]     Arrival date & time 05/07/15  2148 History  This chart was scribed for Debbie Fuel, MD by Stephania Fragmin, ED Scribe. This patient was seen in room MH06/MH06 and the patient's care was started at 11:39 PM.    Chief Complaint  Patient presents with  . Knee Pain   The history is provided by the patient. No language interpreter was used.   HPI Comments: Debbie Velasquez is a 64 y.o. female who presents to the Emergency Department complaining of posterior left knee soreness that began 2 days ago and worsened today. She rates the soreness as 8/10 with weightbearing, and as 0/10 when lying down. Patient notes she has been doing a lot of walking recently while at summer camp. She denies a history of any prior problems with her left knee. She denies a history of smoking or drinking and states she is typically in good health. She denies any known swelling, chest pain, or SOB.  PCP: Philis Fendt, MD   Past Medical History  Diagnosis Date  . Diabetes mellitus without complication   . Hypertension   . Kidney stones     hx kidney stone 2015   Past Surgical History  Procedure Laterality Date  . Abdominal hysterectomy    . Cesarean section     Family History  Problem Relation Age of Onset  . Esophageal cancer Maternal Aunt   . Colon cancer Neg Hx   . Stomach cancer Neg Hx   . Rectal cancer Neg Hx    History  Substance Use Topics  . Smoking status: Never Smoker   . Smokeless tobacco: Never Used  . Alcohol Use: No   OB History    No data available     Review of Systems  Respiratory: Negative for shortness of breath.   Cardiovascular: Negative for chest pain and leg swelling.  Musculoskeletal: Positive for myalgias (posterior left leg pain).  All other systems reviewed and are negative.  Allergies  Review of patient's allergies indicates no known allergies.  Home Medications   Prior to Admission medications   Medication Sig Start Date End Date Taking?  Authorizing Provider  acetaminophen (TYLENOL) 500 MG tablet Take 1,000 mg by mouth every 6 (six) hours as needed for mild pain or headache.    Historical Provider, MD  losartan (COZAAR) 25 MG tablet Take 25 mg by mouth daily.    Historical Provider, MD  meloxicam (MOBIC) 7.5 MG tablet Take 7.5 mg by mouth daily.    Historical Provider, MD  metFORMIN (GLUCOPHAGE) 500 MG tablet Take 500 mg by mouth 2 (two) times daily with a meal.    Historical Provider, MD   BP 157/72 mmHg  Pulse 83  Temp(Src) 98.2 F (36.8 C) (Oral)  Resp 18  Ht 5\' 4"  (1.626 m)  Wt 250 lb (113.399 kg)  BMI 42.89 kg/m2  SpO2 100% Physical Exam  Constitutional: She is oriented to person, place, and time. She appears well-developed and well-nourished. No distress.  HENT:  Head: Normocephalic and atraumatic.  Eyes: EOM are normal. Pupils are equal, round, and reactive to light.  Neck: Normal range of motion. Neck supple. No JVD present.  Cardiovascular: Normal rate, regular rhythm and normal heart sounds.   No murmur heard. Pulmonary/Chest: Effort normal and breath sounds normal. She has no wheezes. She has no rales. She exhibits no tenderness.  Abdominal: Soft. Bowel sounds are normal. She exhibits no distension and no mass. There is no tenderness.  Musculoskeletal: Normal range of motion. She exhibits no edema.  Moderate tenderness to left popliteal area. No effusion. No instability. Lachman and McMurray tests negative. Left calf circumference 0.5 cm greater than right calf circumference. No cords palpable.  Lymphadenopathy:    She has no cervical adenopathy.  Neurological: She is alert and oriented to person, place, and time. No cranial nerve deficit. She exhibits normal muscle tone. Coordination normal.  Skin: Skin is warm and dry. No rash noted.  Psychiatric: She has a normal mood and affect. Her behavior is normal. Judgment and thought content normal.  Nursing note and vitals reviewed.   ED Course  Procedures  (including critical care time)  DIAGNOSTIC STUDIES: Oxygen Saturation is 100% on RA, normal by my interpretation.    COORDINATION OF CARE: 11:41 PM - Suspect Baker's cyst. Discussed treatment plan with pt at bedside which includes left knee XR and U/S to r/o DVT, knee compression, anti-inflammatory medication, and possible crutches. Pt verbalized understanding and agreed to plan.   Imaging Review US Venous Img Lower Unilateral Left  05/08/2015   CLINICAL DATA:  Posterior pain and swelling in the left knee for 3 days, worse with movement. Spider veins.  EXAM: Left LOWER EXTREMITY VENOUS DOPPLER ULTRASOUND  TECHNIQUE: Gray-scale sonography with graded compression, as well as color Doppler and duplex ultrasound were performed to evaluate the lower extremity deep venous systems from the level of the common femoral vein and including the common femoral, femoral, profunda femoral, popliteal and calf veins including the posterior tibial, peroneal and gastrocnemius veins when visible. The superficial great saphenous vein was also interrogated. Spectral Doppler was utilized to evaluate flow at rest and with distal augmentation maneuvers in the common femoral, femoral and popliteal veins.  COMPARISON:  None.  FINDINGS: Contralateral Common Femoral Vein: Respiratory phasicity is normal and symmetric with the symptomatic side. No evidence of thrombus. Normal compressibility.  Common Femoral Vein: No evidence of thrombus. Normal compressibility, respiratory phasicity and response to augmentation.  Saphenofemoral Junction: No evidence of thrombus. Normal compressibility and flow on color Doppler imaging.  Profunda Femoral Vein: No evidence of thrombus. Normal compressibility and flow on color Doppler imaging.  Femoral Vein: No evidence of thrombus. Normal compressibility, respiratory phasicity and response to augmentation.  Popliteal Vein: No evidence of thrombus. Normal compressibility, respiratory phasicity and  response to augmentation.  Calf Veins: No evidence of thrombus. Normal compressibility and flow on color Doppler imaging.  Superficial Great Saphenous Vein: No evidence of thrombus. Normal compressibility and flow on color Doppler imaging.  Venous Reflux:  None.  Other Findings:  Small popliteal cyst measuring 5.3 x 0.9 x 1.6 cm.  IMPRESSION: No evidence of deep venous thrombosis.  Small popliteal cyst.   Electronically Signed   By: Lucienne Capers M.D.   On: 05/08/2015 00:23   Dg Knee Complete 4 Views Left  05/08/2015   CLINICAL DATA:  64 year old female with pain in the left knee  EXAM: LEFT KNEE - COMPLETE 4+ VIEW  COMPARISON:  None.  FINDINGS: No acute fracture or dislocation. 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 Juxta-articular bone spurring noted.  IMPRESSION: No acute fracture or dislocation. Degenerative changes with narrowing of the medial compartment.   Electronically Signed   By: Anner Crete M.D.   On: 05/08/2015 00:44     MDM   Final diagnoses:  Pain in left knee    Left knee pain suspicious for Baker's cyst. She is sent for ultrasound to rule  out DVT which is negative. X-rays show degenerative changes. She is placed in a knee immobilizer for comfort and advised to use a cane or crutch as needed. She has both of those at home. Prescription is given for naproxen and tramadol and she is referred to orthopedics for follow-up.   I personally performed the services described in this documentation, which was scribed in my presence. The recorded information has been reviewed and is accurate.       Debbie Fuel, MD 25/85/27 [id number]

## 2015-05-07 NOTE — ED Notes (Signed)
C/o pain behind left knee x 4 days  Denies inj, no deformity noted,  Has been doing a lot of walking

## 2015-05-08 DIAGNOSIS — M1712 Unilateral primary osteoarthritis, left knee: Secondary | ICD-10-CM | POA: Diagnosis not present

## 2015-05-08 DIAGNOSIS — M7122 Synovial cyst of popliteal space [Baker], left knee: Secondary | ICD-10-CM | POA: Diagnosis not present

## 2015-05-08 MED ORDER — NAPROXEN 500 MG PO TABS: 500.0000 mg | ORAL_TABLET | Freq: Two times a day (BID) | ORAL | Status: DC

## 2015-05-08 MED ORDER — TRAMADOL HCL 50 MG PO TABS: 50.0000 mg | ORAL_TABLET | Freq: Four times a day (QID) | ORAL | Status: DC | PRN

## 2015-05-08 NOTE — Discharge Instructions (Signed)
Wear the knee immobilizer as needed. Use a cane or crutch as needed.  Arthritis, Nonspecific Arthritis is inflammation of a joint. This usually means pain, redness, warmth or swelling are present. One or more joints may be involved. There are a number of types of arthritis. Your caregiver may not be able to tell what type of arthritis you have right away. CAUSES  The most common cause of arthritis is the wear and tear on the joint (osteoarthritis). This causes damage to the cartilage, which can break down over time. The knees, hips, back and neck are most often affected by this type of arthritis. Other types of arthritis and common causes of joint pain include:  Sprains and other injuries near the joint. Sometimes minor sprains and injuries cause pain and swelling that develop hours later.  Rheumatoid arthritis. This affects hands, feet and knees. It usually affects both sides of your body at the same time. It is often associated with chronic ailments, fever, weight loss and general weakness.  Crystal arthritis. Gout and pseudo gout can cause occasional acute severe pain, redness and swelling in the foot, ankle, or knee.  Infectious arthritis. Bacteria can get into a joint through a break in overlying skin. This can cause infection of the joint. Bacteria and viruses can also spread through the blood and affect your joints.  Drug, infectious and allergy reactions. Sometimes joints can become mildly painful and slightly swollen with these types of illnesses. SYMPTOMS   Pain is the main symptom.  Your joint or joints can also be red, swollen and warm or hot to the touch.  You may have a fever with certain types of arthritis, or even feel overall ill.  The joint with arthritis will hurt with movement. Stiffness is present with some types of arthritis. DIAGNOSIS  Your caregiver will suspect arthritis based on your description of your symptoms and on your exam. Testing may be needed to find the  type of arthritis:  Blood and sometimes urine tests.  X-ray tests and sometimes CT or MRI scans.  Removal of fluid from the joint (arthrocentesis) is done to check for bacteria, crystals or other causes. Your caregiver (or a specialist) will numb the area over the joint with a local anesthetic, and use a needle to remove joint fluid for examination. This procedure is only minimally uncomfortable.  Even with these tests, your caregiver may not be able to tell what kind of arthritis you have. Consultation with a specialist (rheumatologist) may be helpful. TREATMENT  Your caregiver will discuss with you treatment specific to your type of arthritis. If the specific type cannot be determined, then the following general recommendations may apply. Treatment of severe joint pain includes:  Rest.  Elevation.  Anti-inflammatory medication (for example, ibuprofen) may be prescribed. Avoiding activities that cause increased pain.  Only take over-the-counter or prescription medicines for pain and discomfort as recommended by your caregiver.  Cold packs over an inflamed joint may be used for 10 to 15 minutes every hour. Hot packs sometimes feel better, but do not use overnight. Do not use hot packs if you are diabetic without your caregiver's permission.  A cortisone shot into arthritic joints may help reduce pain and swelling.  Any acute arthritis that gets worse over the next 1 to 2 days needs to be looked at to be sure there is no joint infection. Long-term arthritis treatment involves modifying activities and lifestyle to reduce joint stress jarring. This can include weight loss. Also, exercise is  needed to nourish the joint cartilage and remove waste. This helps keep the muscles around the joint strong. HOME CARE INSTRUCTIONS   Do not take aspirin to relieve pain if gout is suspected. This elevates uric acid levels.  Only take over-the-counter or prescription medicines for pain, discomfort or  fever as directed by your caregiver.  Rest the joint as much as possible.  If your joint is swollen, keep it elevated.  Use crutches if the painful joint is in your leg.  Drinking plenty of fluids may help for certain types of arthritis.  Follow your caregiver's dietary instructions.  Try low-impact exercise such as:  Swimming.  Water aerobics.  Biking.  Walking.  Morning stiffness is often relieved by a warm shower.  Put your joints through regular range-of-motion. SEEK MEDICAL CARE IF:   You do not feel better in 24 hours or are getting worse.  You have side effects to medications, or are not getting better with treatment. SEEK IMMEDIATE MEDICAL CARE IF:   You have a fever.  You develop severe joint pain, swelling or redness.  Many joints are involved and become painful and swollen.  There is severe back pain and/or leg weakness.  You have loss of bowel or bladder control. Document Released: 11/16/2004 Document Revised: 01/01/2012 Document Reviewed: 12/02/2008 [facility] Patient Information 2015 [place], [place]. This information is not intended to replace advice given to you by your health care provider. Make sure you discuss any questions you have with your health care provider.  Baker Cyst A Baker cyst is a sac-like structure that forms in the back of the knee. It is filled with the same fluid that is located in your knee. This fluid lubricates the bones and cartilage of the knee and allows them to move over each other more easily. CAUSES  When the knee becomes injured or inflamed, increased fluid forms in the knee. When this happens, the joint lining is pushed out behind the knee and forms the Baker cyst. This cyst may also be caused by inflammation from arthritic conditions and infections. SIGNS AND SYMPTOMS  A Baker cyst usually has no symptoms. When the cyst is substantially enlarged:  You may feel pressure behind the knee, stiffness in the knee, or a mass in  the area behind the knee.  You may develop pain, redness, and swelling in the calf. This can suggest a blood clot and requires evaluation by your health care provider. DIAGNOSIS  A Baker cyst is most often found during an ultrasound exam. This exam may have been performed for other reasons, and the cyst was found incidentally. Sometimes an MRI is used. This picks up other problems within a joint that an ultrasound exam may not. If the Baker cyst developed immediately after an injury, X-ray exams may be used to diagnose the cyst. TREATMENT  The treatment depends on the cause of the cyst. Anti-inflammatory medicines and rest often will be prescribed. If the cyst is caused by a bacterial infection, antibiotic medicines may be prescribed.  HOME CARE INSTRUCTIONS   If the cyst was caused by an injury, for the first 24 hours, keep the injured leg elevated on 2 pillows while lying down.  For the first 24 hours while you are awake, apply ice to the injured area:  Put ice in a plastic bag.  Place a towel between your skin and the bag.  Leave the ice on for 20 minutes, 2-3 times a day.  Only take over-the-counter or prescription medicines for  pain, discomfort, or fever as directed by your health care provider.  Only take antibiotic medicine as directed. Make sure to finish it even if you start to feel better. MAKE SURE YOU:   Understand these instructions.  Will watch your condition.  Will get help right away if you are not doing well or get worse. Document Released: 10/09/2005 Document Revised: 07/30/2013 Document Reviewed: 05/21/2013 [facility] Patient Information 2015 [place], [place]. This information is not intended to replace advice given to you by your health care provider. Make sure you discuss any questions you have with your health care provider.  Naproxen and naproxen sodium oral immediate-release tablets What is this medicine? NAPROXEN (na PROX en) is a non-steroidal  anti-inflammatory drug (NSAID). It is used to reduce swelling and to treat pain. This medicine may be used for dental pain, headache, or painful monthly periods. It is also used for painful joint and muscular problems such as arthritis, tendinitis, bursitis, and gout. This medicine may be used for other purposes; ask your health care provider or pharmacist if you have questions. COMMON BRAND NAME(S): Aflaxen, Aleve, Aleve Arthritis, All Day Relief, Anaprox, Anaprox DS, Naprosyn What should I tell my health care provider before I take this medicine? They need to know if you have any of these conditions: -asthma -cigarette smoker -drink more than 3 alcohol containing drinks a day -heart disease or circulation problems such as heart failure or leg edema (fluid retention) -high blood pressure -kidney disease -liver disease -stomach bleeding or ulcers -an unusual or allergic reaction to naproxen, aspirin, other NSAIDs, other medicines, foods, dyes, or preservatives -pregnant or trying to get pregnant -breast-feeding How should I use this medicine? Take this medicine by mouth with a glass of water. Follow the directions on the prescription label. Take it with food if your stomach gets upset. Try to not lie down for at least 10 minutes after you take it. Take your medicine at regular intervals. Do not take your medicine more often than directed. Long-term, continuous use may increase the risk of heart attack or stroke. A special MedGuide will be given to you by the pharmacist with each prescription and refill. Be sure to read this information carefully each time. Talk to your pediatrician regarding the use of this medicine in children. Special care may be needed. Overdosage: If you think you have taken too much of this medicine contact a poison control center or emergency room at once. NOTE: This medicine is only for you. Do not share this medicine with others. What if I miss a dose? If you miss a  dose, take it as soon as you can. If it is almost time for your next dose, take only that dose. Do not take double or extra doses. What may interact with this medicine? -alcohol -aspirin -cidofovir -diuretics -lithium -methotrexate -other drugs for inflammation like ketorolac or prednisone -pemetrexed -probenecid -warfarin This list may not describe all possible interactions. Give your health care provider a list of all the medicines, herbs, non-prescription drugs, or dietary supplements you use. Also tell them if you smoke, drink alcohol, or use illegal drugs. Some items may interact with your medicine. What should I watch for while using this medicine? Tell your doctor or health care professional if your pain does not get better. Talk to your doctor before taking another medicine for pain. Do not treat yourself. This medicine does not prevent heart attack or stroke. In fact, this medicine may increase the chance of a heart attack or  stroke. The chance may increase with longer use of this medicine and in people who have heart disease. If you take aspirin to prevent heart attack or stroke, talk with your doctor or health care professional. Do not take other medicines that contain aspirin, ibuprofen, or naproxen with this medicine. Side effects such as stomach upset, nausea, or ulcers may be more likely to occur. Many medicines available without a prescription should not be taken with this medicine. This medicine can cause ulcers and bleeding in the stomach and intestines at any time during treatment. Do not smoke cigarettes or drink alcohol. These increase irritation to your stomach and can make it more susceptible to damage from this medicine. Ulcers and bleeding can happen without warning symptoms and can cause death. You may get drowsy or dizzy. Do not drive, use machinery, or do anything that needs mental alertness until you know how this medicine affects you. Do not stand or sit up quickly,  especially if you are an older patient. This reduces the risk of dizzy or fainting spells. This medicine can cause you to bleed more easily. Try to avoid damage to your teeth and gums when you brush or floss your teeth. What side effects may I notice from receiving this medicine? Side effects that you should report to your doctor or health care professional as soon as possible: -black or bloody stools, blood in the urine or vomit -blurred vision -chest pain -difficulty breathing or wheezing -nausea or vomiting -severe stomach pain -skin rash, skin redness, blistering or peeling skin, hives, or itching -slurred speech or weakness on one side of the body -swelling of eyelids, throat, lips -unexplained weight gain or swelling -unusually weak or tired -yellowing of eyes or skin Side effects that usually do not require medical attention (report to your doctor or health care professional if they continue or are bothersome): -constipation -headache -heartburn This list may not describe all possible side effects. Call your doctor for medical advice about side effects. You may report side effects to FDA at 1-800-FDA-1088. Where should I keep my medicine? Keep out of the reach of children. Store at room temperature between 15 and 30 degrees C (59 and 86 degrees F). Keep container tightly closed. Throw away any unused medicine after the expiration date. NOTE: This sheet is a summary. It may not cover all possible information. If you have questions about this medicine, talk to your doctor, pharmacist, or health care provider.  2015, [employer]/Gold Standard. (2009-10-11 20:10:16)  Tramadol tablets What is this medicine? TRAMADOL (TRA ma dole) is a pain reliever. It is used to treat moderate to severe pain in adults. This medicine may be used for other purposes; ask your health care provider or pharmacist if you have questions. COMMON BRAND NAME(S): Ultram What should I tell my health care provider  before I take this medicine? They need to know if you have any of these conditions: -brain tumor -depression -drug abuse or addiction -head injury -if you frequently drink alcohol containing drinks -kidney disease or trouble passing urine -liver disease -lung disease, asthma, or breathing problems -seizures or epilepsy -suicidal thoughts, plans, or attempt; a previous suicide attempt by you or a family member -an unusual or allergic reaction to tramadol, codeine, other medicines, foods, dyes, or preservatives -pregnant or trying to get pregnant -breast-feeding How should I use this medicine? Take this medicine by mouth with a full glass of water. Follow the directions on the prescription label. If the medicine upsets your stomach, take  it with food or milk. Do not take more medicine than you are told to take. Talk to your pediatrician regarding the use of this medicine in children. Special care may be needed. Overdosage: If you think you have taken too much of this medicine contact a poison control center or emergency room at once. NOTE: This medicine is only for you. Do not share this medicine with others. What if I miss a dose? If you miss a dose, take it as soon as you can. If it is almost time for your next dose, take only that dose. Do not take double or extra doses. What may interact with this medicine? Do not take this medicine with any of the following medications: -MAOIs like Carbex, Eldepryl, Marplan, Nardil, and Parnate This medicine may also interact with the following medications: -alcohol or medicines that contain alcohol -antihistamines -benzodiazepines -bupropion -carbamazepine or oxcarbazepine -clozapine -cyclobenzaprine -digoxin -furazolidone -linezolid -medicines for depression, anxiety, or psychotic disturbances -medicines for migraine headache like almotriptan, eletriptan, frovatriptan, naratriptan, rizatriptan, sumatriptan, zolmitriptan -medicines for pain  like pentazocine, buprenorphine, butorphanol, meperidine, nalbuphine, and propoxyphene -medicines for sleep -muscle relaxants -naltrexone -phenobarbital -phenothiazines like perphenazine, thioridazine, chlorpromazine, mesoridazine, fluphenazine, prochlorperazine, promazine, and trifluoperazine -procarbazine -warfarin This list may not describe all possible interactions. Give your health care provider a list of all the medicines, herbs, non-prescription drugs, or dietary supplements you use. Also tell them if you smoke, drink alcohol, or use illegal drugs. Some items may interact with your medicine. What should I watch for while using this medicine? Tell your doctor or health care professional if your pain does not go away, if it gets worse, or if you have new or a different type of pain. You may develop tolerance to the medicine. Tolerance means that you will need a higher dose of the medicine for pain relief. Tolerance is normal and is expected if you take this medicine for a long time. Do not suddenly stop taking your medicine because you may develop a severe reaction. Your body becomes used to the medicine. This does NOT mean you are addicted. Addiction is a behavior related to getting and using a drug for a non-medical reason. If you have pain, you have a medical reason to take pain medicine. Your doctor will tell you how much medicine to take. If your doctor wants you to stop the medicine, the dose will be slowly lowered over time to avoid any side effects. You may get drowsy or dizzy. Do not drive, use machinery, or do anything that needs mental alertness until you know how this medicine affects you. Do not stand or sit up quickly, especially if you are an older patient. This reduces the risk of dizzy or fainting spells. Alcohol can increase or decrease the effects of this medicine. Avoid alcoholic drinks. You may have constipation. Try to have a bowel movement at least every 2 to 3 days. If you  do not have a bowel movement for 3 days, call your doctor or health care professional. Your mouth may get dry. Chewing sugarless gum or sucking hard candy, and drinking plenty of water may help. Contact your doctor if the problem does not go away or is severe. What side effects may I notice from receiving this medicine? Side effects that you should report to your doctor or health care professional as soon as possible: -allergic reactions like skin rash, itching or hives, swelling of the face, lips, or tongue -breathing difficulties, wheezing -confusion -itching -light headedness or fainting spells -  redness, blistering, peeling or loosening of the skin, including inside the mouth -seizures Side effects that usually do not require medical attention (report to your doctor or health care professional if they continue or are bothersome): -constipation -dizziness -drowsiness -headache -nausea, vomiting This list may not describe all possible side effects. Call your doctor for medical advice about side effects. You may report side effects to FDA at 1-800-FDA-1088. Where should I keep my medicine? Keep out of the reach of children. Store at room temperature between 15 and 30 degrees C (59 and 86 degrees F). Keep container tightly closed. Throw away any unused medicine after the expiration date. NOTE: This sheet is a summary. It may not cover all possible information. If you have questions about this medicine, talk to your doctor, pharmacist, or health care provider.  2015, [employer]vier/Gold Standard. (2010-06-22 11:55:44)

## 2015-05-10 ENCOUNTER — Telehealth: Payer: Self-pay | Admitting: Internal Medicine

## 2015-05-10 NOTE — Telephone Encounter (Signed)
No charge. 

## 2015-05-12 ENCOUNTER — Encounter: Payer: Medicare Other | Admitting: Internal Medicine

## 2015-06-01 DIAGNOSIS — I10 Essential (primary) hypertension: Secondary | ICD-10-CM | POA: Diagnosis not present

## 2015-06-01 DIAGNOSIS — E119 Type 2 diabetes mellitus without complications: Secondary | ICD-10-CM | POA: Diagnosis not present

## 2015-06-01 DIAGNOSIS — Z6841 Body Mass Index (BMI) 40.0 and over, adult: Secondary | ICD-10-CM | POA: Diagnosis not present

## 2015-06-01 DIAGNOSIS — M179 Osteoarthritis of knee, unspecified: Secondary | ICD-10-CM | POA: Diagnosis not present

## 2015-06-15 DIAGNOSIS — M1712 Unilateral primary osteoarthritis, left knee: Secondary | ICD-10-CM | POA: Diagnosis not present

## 2015-06-15 DIAGNOSIS — M25562 Pain in left knee: Secondary | ICD-10-CM | POA: Diagnosis not present

## 2015-06-15 DIAGNOSIS — M7540 Impingement syndrome of unspecified shoulder: Secondary | ICD-10-CM | POA: Diagnosis not present

## 2015-07-27 DIAGNOSIS — M7540 Impingement syndrome of unspecified shoulder: Secondary | ICD-10-CM | POA: Diagnosis not present

## 2015-07-27 DIAGNOSIS — M25562 Pain in left knee: Secondary | ICD-10-CM | POA: Diagnosis not present

## 2015-08-12 ENCOUNTER — Encounter (HOSPITAL_COMMUNITY): Payer: Self-pay | Admitting: *Deleted

## 2015-08-12 ENCOUNTER — Emergency Department (HOSPITAL_COMMUNITY)
Admission: EM | Admit: 2015-08-12 | Discharge: 2015-08-13 | Disposition: A | Discharge status: Home / Self Care | Payer: Medicare Other | Attending: Emergency Medicine | Admitting: Emergency Medicine

## 2015-08-12 DIAGNOSIS — N23 Unspecified renal colic: Secondary | ICD-10-CM | POA: Insufficient documentation

## 2015-08-12 DIAGNOSIS — N132 Hydronephrosis with renal and ureteral calculous obstruction: Secondary | ICD-10-CM | POA: Diagnosis not present

## 2015-08-12 DIAGNOSIS — Z791 Long term (current) use of non-steroidal anti-inflammatories (NSAID): Secondary | ICD-10-CM | POA: Insufficient documentation

## 2015-08-12 DIAGNOSIS — N3090 Cystitis, unspecified without hematuria: Secondary | ICD-10-CM | POA: Diagnosis not present

## 2015-08-12 DIAGNOSIS — Z79899 Other long term (current) drug therapy: Secondary | ICD-10-CM | POA: Diagnosis not present

## 2015-08-12 DIAGNOSIS — R109 Unspecified abdominal pain: Secondary | ICD-10-CM | POA: Diagnosis not present

## 2015-08-12 DIAGNOSIS — Z87442 Personal history of urinary calculi: Secondary | ICD-10-CM | POA: Insufficient documentation

## 2015-08-12 DIAGNOSIS — R350 Frequency of micturition: Secondary | ICD-10-CM | POA: Diagnosis not present

## 2015-08-12 DIAGNOSIS — E119 Type 2 diabetes mellitus without complications: Secondary | ICD-10-CM | POA: Diagnosis not present

## 2015-08-12 DIAGNOSIS — Z792 Long term (current) use of antibiotics: Secondary | ICD-10-CM | POA: Diagnosis not present

## 2015-08-12 DIAGNOSIS — I10 Essential (primary) hypertension: Secondary | ICD-10-CM | POA: Insufficient documentation

## 2015-08-12 DIAGNOSIS — Z7982 Long term (current) use of aspirin: Secondary | ICD-10-CM | POA: Diagnosis not present

## 2015-08-12 NOTE — ED Notes (Signed)
Pt states that last night she began to feel bloated and had achiness in her back; pt states that she thought it was gas pain and took some medicine for that but that it didn't improve; pt states that she was going to urinate frequently and only small amounts earlier today but that has cleared up some; pt states that she began to have more pressure than pain to her flank areas and lower abd pressure; pt states that she went to [place] and they told her that she might be passing a stone; pt states that she was told that she has blood in urine but no signs of infection; pt states that she received a prescription for Keflex; pt states that she is going out of town and wants to make sure she is OK; pt states that she is still only urinating small amounts and feels like she needs to push or strain to urinate

## 2015-08-13 ENCOUNTER — Emergency Department (HOSPITAL_COMMUNITY): Payer: Medicare Other

## 2015-08-13 DIAGNOSIS — N132 Hydronephrosis with renal and ureteral calculous obstruction: Secondary | ICD-10-CM | POA: Diagnosis not present

## 2015-08-13 LAB — COMPREHENSIVE METABOLIC PANEL
ALBUMIN: 3.6 g/dL (ref 3.5–5.0)
ALT: 17 U/L (ref 14–54)
ANION GAP: 7 (ref 5–15)
AST: 20 U/L (ref 15–41)
Alkaline Phosphatase: 63 U/L (ref 38–126)
BUN: 21 mg/dL — ABNORMAL HIGH (ref 6–20)
CALCIUM: 9 mg/dL (ref 8.9–10.3)
CHLORIDE: 105 mmol/L (ref 101–111)
CO2: 27 mmol/L (ref 22–32)
CREATININE: 0.99 mg/dL (ref 0.44–1.00)
GFR calc Af Amer: >60 mL/min (ref >60)
GFR calc non Af Amer: 59 mL/min — ABNORMAL LOW (ref >60)
Glucose, Bld: 114 mg/dL — ABNORMAL HIGH (ref 65–99)
Potassium: 3.6 mmol/L (ref 3.5–5.1)
SODIUM: 139 mmol/L (ref 135–145)
TOTAL PROTEIN: 7.3 g/dL (ref 6.5–8.1)
Total Bilirubin: 0.5 mg/dL (ref 0.3–1.2)

## 2015-08-13 LAB — CBC WITH DIFFERENTIAL/PLATELET
BASOS ABS: 0 10*3/uL (ref 0.0–0.1)
BASOS PCT: 0 %
EOS ABS: 0.3 10*3/uL (ref 0.0–0.7)
Eosinophils Relative: 3 %
HCT: 33.7 % — ABNORMAL LOW (ref 36.0–46.0)
Hemoglobin: 10.9 g/dL — ABNORMAL LOW (ref 12.0–15.0)
Lymphocytes Relative: 22 %
Lymphs Abs: 2.6 10*3/uL (ref 0.7–4.0)
MCH: 28.8 pg (ref 26.0–34.0)
MCHC: 32.3 g/dL (ref 30.0–36.0)
MCV: 89.2 fL (ref 78.0–100.0)
Monocytes Absolute: 0.5 10*3/uL (ref 0.1–1.0)
Monocytes Relative: 5 %
NEUTROS PCT: 70 %
Neutro Abs: 8.3 10*3/uL — ABNORMAL HIGH (ref 1.7–7.7)
PLATELETS: 363 10*3/uL (ref 150–400)
RBC: 3.78 MIL/uL — AB (ref 3.87–5.11)
RDW: 13.3 % (ref 11.5–15.5)
WBC: 11.8 10*3/uL — ABNORMAL HIGH (ref 4.0–10.5)

## 2015-08-13 LAB — URINE MICROSCOPIC-ADD ON

## 2015-08-13 LAB — URINALYSIS, ROUTINE W REFLEX MICROSCOPIC
Bilirubin Urine: NEGATIVE
Glucose, UA: NEGATIVE mg/dL
KETONES UR: NEGATIVE mg/dL
Nitrite: NEGATIVE
PROTEIN: NEGATIVE mg/dL
Specific Gravity, Urine: 1.015 (ref 1.005–1.030)
UROBILINOGEN UA: 0.2 mg/dL (ref 0.0–1.0)
pH: 5.5 (ref 5.0–8.0)

## 2015-08-13 MED ORDER — HYDROCODONE-ACETAMINOPHEN 5-325 MG PO TABS: 1.0000 | ORAL_TABLET | ORAL | Status: DC | PRN

## 2015-08-13 MED ORDER — MORPHINE SULFATE (PF) 4 MG/ML IV SOLN: 4.0000 mg | Freq: Once | INTRAVENOUS | Status: DC

## 2015-08-13 MED ORDER — ONDANSETRON HCL 4 MG/2ML IJ SOLN: 4.0000 mg | Freq: Once | INTRAMUSCULAR | Status: DC

## 2015-08-13 MED ORDER — ONDANSETRON HCL 4 MG PO TABS: 4.0000 mg | ORAL_TABLET | Freq: Three times a day (TID) | ORAL | Status: DC | PRN

## 2015-08-13 MED ORDER — TAMSULOSIN HCL 0.4 MG PO CAPS: 0.4000 mg | ORAL_CAPSULE | Freq: Every day | ORAL | Status: DC

## 2015-08-13 NOTE — ED Notes (Signed)
Per pt she was seen today at [facility] for abdominal "pressure" but not pain. Pt states she has had urinary frequency and they noted blood in her urine and prescribed her antibiotics. Pt states she still is experiencing pressure in her bladder and stated that her urine was concentrated and she has not been able to pass much today.

## 2015-08-13 NOTE — Discharge Instructions (Signed)
Kidney Stones °Kidney stones (urolithiasis) are deposits that form inside your kidneys. The intense pain is caused by the stone moving through the urinary tract. When the stone moves, the ureter goes into spasm around the stone. The stone is usually passed in the urine.  °CAUSES  °· A disorder that makes certain neck glands produce too much parathyroid hormone (primary hyperparathyroidism). °· A buildup of uric acid crystals, similar to gout in your joints. °· Narrowing (stricture) of the ureter. °· A kidney obstruction present at birth (congenital obstruction). °· Previous surgery on the kidney or ureters. °· Numerous kidney infections. °SYMPTOMS  °· Feeling sick to your stomach (nauseous). °· Throwing up (vomiting). °· Blood in the urine (hematuria). °· Pain that usually spreads (radiates) to the groin. °· Frequency or urgency of urination. °DIAGNOSIS  °· Taking a history and physical exam. °· Blood or urine tests. °· CT scan. °· Occasionally, an examination of the inside of the urinary bladder (cystoscopy) is performed. °TREATMENT  °· Observation. °· Increasing your fluid intake. °· Extracorporeal shock wave lithotripsy--This is a noninvasive procedure that uses shock waves to break up kidney stones. °· Surgery may be needed if you have severe pain or persistent obstruction. There are various surgical procedures. Most of the procedures are performed with the use of small instruments. Only small incisions are needed to accommodate these instruments, so recovery time is minimized. °The size, location, and chemical composition are all important variables that will determine the proper choice of action for you. Talk to your health care provider to better understand your situation so that you will minimize the risk of injury to yourself and your kidney.  °HOME CARE INSTRUCTIONS  °· Drink enough water and fluids to keep your urine clear or pale yellow. This will help you to pass the stone or stone fragments. °· Strain  all urine through the provided strainer. Keep all particulate matter and stones for your health care provider to see. The stone causing the pain may be as small as a grain of salt. It is very important to use the strainer each and every time you pass your urine. The collection of your stone will allow your health care provider to analyze it and verify that a stone has actually passed. The stone analysis will often identify what you can do to reduce the incidence of recurrences. °· Only take over-the-counter or prescription medicines for pain, discomfort, or fever as directed by your health care provider. °· Keep all follow-up visits as told by your health care provider. This is important. °· Get follow-up X-rays if required. The absence of pain does not always mean that the stone has passed. It may have only stopped moving. If the urine remains completely obstructed, it can cause loss of kidney function or even complete destruction of the kidney. It is your responsibility to make sure X-rays and follow-ups are completed. Ultrasounds of the kidney can show blockages and the status of the kidney. Ultrasounds are not associated with any radiation and can be performed easily in a matter of minutes. °· Make changes to your daily diet as told by your health care provider. You may be told to: °¨ Limit the amount of salt that you eat. °¨ Eat 5 or more servings of fruits and vegetables each day. °¨ Limit the amount of meat, poultry, fish, and eggs that you eat. °· Collect a 24-hour urine sample as told by your health care provider. You may need to collect another urine sample every 6-12   months. °SEEK MEDICAL CARE IF: °· You experience pain that is progressive and unresponsive to any pain medicine you have been prescribed. °SEEK IMMEDIATE MEDICAL CARE IF:  °· Pain cannot be controlled with the prescribed medicine. °· You have a fever or shaking chills. °· The severity or intensity of pain increases over 18 hours and is not  relieved by pain medicine. °· You develop a new onset of abdominal pain. °· You feel faint or pass out. °· You are unable to urinate. °  °This information is not intended to replace advice given to you by your health care provider. Make sure you discuss any questions you have with your health care provider. °  °Document Released: 10/09/2005 Document Revised: 06/30/2015 Document Reviewed: 03/12/2013 °Elsevier Interactive Patient Education ©2016 [employer] ° °

## 2015-08-13 NOTE — ED Provider Notes (Signed)
CSN: [id number]   Arrival date & time 08/12/15 2323  History  By signing my name below, I, Altamease Oiler, attest that this documentation has been prepared under the direction and in the presence of Julianne Rice, MD. Electronically Signed: Altamease Oiler, ED Scribe. 08/13/2015. 1:54 AM.  Chief Complaint  Patient presents with  . Flank Pain    HPI The history is provided by the patient. No language interpreter was used.   Debbie Velasquez is a 64 y.o. female with history of kidney stones who presents to the Emergency Department complaining of lower abdominal pain with sudden onset this morning at 5:30 upon waking. She describes the pain as bloating/pressure that radiates to the lower back. Initially pt thought that the pain was related to gas but had no relief from OTC gas medication. This pain is similar in quality but less severe than the pain she had last year with a kidney stone. Associated symptoms include mild nausea, increased frequency, and dark urine. She was seen at [facility] today where she had a small amount of blood in her urine but no definite infection. At that time she was started on Keflex and has had 2 doses so far. Pt denies fever, chills, and vomiting. Her last bowel movement was today and normal with no straining.   Past Medical History  Diagnosis Date  . Diabetes mellitus without complication ([place])   . Hypertension   . Kidney stones     hx kidney stone 2015    Past Surgical History  Procedure Laterality Date  . Abdominal hysterectomy    . Cesarean section      Family History  Problem Relation Age of Onset  . Esophageal cancer Maternal Aunt   . Colon cancer Neg Hx   . Stomach cancer Neg Hx   . Rectal cancer Neg Hx     Social History  Substance Use Topics  . Smoking status: Never Smoker   . Smokeless tobacco: Never Used  . Alcohol Use: No     Review of Systems  Constitutional: Negative for fever and chills.  Respiratory: Negative for shortness of  breath.   Cardiovascular: Negative for chest pain.  Gastrointestinal: Positive for nausea and abdominal pain. Negative for vomiting and diarrhea.  Genitourinary: Positive for frequency and hematuria. Negative for dysuria and pelvic pain.  Musculoskeletal: Positive for back pain. Negative for neck pain and neck stiffness.  Skin: Negative for rash and wound.  Neurological: Negative for dizziness, weakness, light-headedness, numbness and headaches.  All other systems reviewed and are negative.  Home Medications   Prior to Admission medications   Medication Sig Start Date End Date Taking? Authorizing Provider  acetaminophen (TYLENOL) 500 MG tablet Take 1,000 mg by mouth every 6 (six) hours as needed for mild pain or headache.   Yes Historical Provider, MD  alum hydroxide-mag trisilicate (GAVISCON) 34-74 MG CHEW chewable tablet Chew 2 tablets by mouth 2 (two) times daily as needed for indigestion or heartburn.   Yes Historical Provider, MD  aspirin EC 81 MG tablet Take 81 mg by mouth daily.   Yes Historical Provider, MD  cephALEXin (KEFLEX) 500 MG capsule Take 500 mg by mouth 4 (four) times daily.   Yes Historical Provider, MD  losartan (COZAAR) 25 MG tablet Take 25 mg by mouth daily.   Yes Historical Provider, MD  meloxicam (MOBIC) 7.5 MG tablet Take 7.5 mg by mouth daily.   Yes Historical Provider, MD  metFORMIN (GLUCOPHAGE) 500 MG tablet Take 500 mg by  mouth 2 (two) times daily with a meal.   Yes Historical Provider, MD  HYDROcodone-acetaminophen (NORCO/VICODIN) 5-325 MG tablet Take 1 tablet by mouth every 4 (four) hours as needed for severe pain. 08/13/15   Julianne Rice, MD  naproxen (NAPROSYN) 500 MG tablet Take 1 tablet (500 mg total) by mouth 2 (two) times daily. Patient not taking: Reported on 08/13/2015 9/62/83   Delora Fuel, MD  ondansetron (ZOFRAN) 4 MG tablet Take 1 tablet (4 mg total) by mouth every 8 (eight) hours as needed for nausea or vomiting. 08/13/15   Julianne Rice, MD   tamsulosin (FLOMAX) 0.4 MG CAPS capsule Take 1 capsule (0.4 mg total) by mouth daily. 08/13/15   Julianne Rice, MD  traMADol (ULTRAM) 50 MG tablet Take 1 tablet (50 mg total) by mouth every 6 (six) hours as needed. Patient not taking: Reported on 08/13/2015 6/62/94   Delora Fuel, MD    Allergies  Review of patient's allergies indicates no known allergies.  Triage Vitals: BP 165/80 mmHg  Pulse 77  Temp(Src) 98.1 F (36.7 C) (Oral)  Resp 20  SpO2 98%  Physical Exam  Constitutional: She is oriented to person, place, and time. She appears well-developed and well-nourished. No distress.  HENT:  Head: Normocephalic and atraumatic.  Mouth/Throat: Oropharynx is clear and moist.  Eyes: EOM are normal. Pupils are equal, round, and reactive to light.  Neck: Normal range of motion. Neck supple.  Cardiovascular: Normal rate and regular rhythm.   Pulmonary/Chest: Effort normal and breath sounds normal. No respiratory distress. She has no wheezes. She has no rales. She exhibits no tenderness.  Abdominal: Soft. Bowel sounds are normal. She exhibits no distension and no mass. There is no tenderness. There is no rebound and no guarding.  Musculoskeletal: Normal range of motion. She exhibits tenderness (mild left lower lumbar region tenderness to palpation. No definite CVA tenderness.). She exhibits no edema.  Neurological: She is alert and oriented to person, place, and time.  Moving all extremities without deficit. Sensation is grossly intact.  Skin: Skin is warm and dry. No rash noted. No erythema.  Psychiatric: She has a normal mood and affect. Her behavior is normal.  Nursing note and vitals reviewed.   ED Course  Procedures   DIAGNOSTIC STUDIES: Oxygen Saturation is 98% on RA, normal by my interpretation.    COORDINATION OF CARE: 1:51 AM Discussed treatment plan which includes lab work and CT A/P with pt at bedside and pt agreed to plan.  Labs Reviewed  COMPREHENSIVE METABOLIC  PANEL - Abnormal; Notable for the following:    Glucose, Bld 114 (*)    BUN 21 (*)    GFR calc non Af Amer 59 (*)    All other components within normal limits  CBC WITH DIFFERENTIAL/PLATELET - Abnormal; Notable for the following:    WBC 11.8 (*)    RBC 3.78 (*)    Hemoglobin 10.9 (*)    HCT 33.7 (*)    Neutro Abs 8.3 (*)    All other components within normal limits  URINALYSIS, ROUTINE W REFLEX MICROSCOPIC (NOT AT [facility]) - Abnormal; Notable for the following:    Hgb urine dipstick LARGE (*)    Leukocytes, UA TRACE (*)    All other components within normal limits  URINE MICROSCOPIC-ADD ON    Imaging Review Ct Abdomen Pelvis Wo Contrast  08/13/2015  CLINICAL DATA:  Acute onset of lower abdominal pain and lower back pain. Microhematuria. Initial encounter. EXAM: CT ABDOMEN AND PELVIS WITHOUT  CONTRAST TECHNIQUE: Multidetector CT imaging of the abdomen and pelvis was performed following the standard protocol without IV contrast. COMPARISON:  CT of the abdomen and pelvis from 07/12/2013 FINDINGS: The visualized lung bases are clear. The liver and spleen are unremarkable in appearance. The gallbladder is within normal limits. The pancreas and adrenal glands are unremarkable. Moderate right-sided hydronephrosis is noted, with right-sided perinephric stranding and fluid. There is diffuse prominence of the right ureter, to the level of an obstructing 4 x 3 mm stone at the right vesicoureteral junction. No nonobstructing renal stones are identified. Note is made of a poorly characterized 2.7 cm isodense mass arising at the posterior aspect of the left kidney, apparently new from 2014. Renal ultrasound or contrast-enhanced CT would be helpful for further evaluation. No free fluid is identified. The small bowel is unremarkable in appearance. The stomach is within normal limits. No acute vascular abnormalities are seen. The appendix is normal in caliber, without evidence of appendicitis. Minimal diverticulosis  is noted along the mid sigmoid colon, without evidence of diverticulitis. The bladder is mildly distended and grossly unremarkable. The patient is status post hysterectomy. No suspicious adnexal masses are seen. No inguinal lymphadenopathy is seen. No acute osseous abnormalities are identified. Facet disease is noted along the lumbar spine. IMPRESSION: 1. Moderate right-sided hydronephrosis, with diffuse prominence of the right ureter, to the level of an obstructing 4 x 3 mm stone distally at the right vesicoureteral junction. 2. Poorly characterized 2.7 cm isodense mass arising at the posterior aspect of the left kidney, apparently new from 2014. Renal ultrasound or contrast-enhanced CT would be helpful for further evaluation, when and as deemed clinically appropriate. 3. Minimal diverticulosis along the mid sigmoid colon, without evidence of diverticulitis. Electronically Signed   By: Garald Balding M.D.   On: 08/13/2015 03:11    I personally reviewed and evaluated these images and lab results as a part of my medical decision-making.    MDM   Final diagnoses:  Renal colic on right side     I, Rashun Grattan, personally performed the services described in this documentation. All medical record entries made by the scribe were at my direction and in my presence.  I have reviewed the chart and discharge instructions and agree that the record reflects my personal performance and is accurate and complete. Fernado Brigante.  08/14/2015. 5:46 AM.    Patient's symptoms are now resolved. Advised to follow-up with urology. Return precautions given.  Julianne Rice, MD 08/14/15 [phone]

## 2015-09-09 DIAGNOSIS — I10 Essential (primary) hypertension: Secondary | ICD-10-CM | POA: Diagnosis not present

## 2015-09-09 DIAGNOSIS — Z6841 Body Mass Index (BMI) 40.0 and over, adult: Secondary | ICD-10-CM | POA: Diagnosis not present

## 2015-09-09 DIAGNOSIS — E119 Type 2 diabetes mellitus without complications: Secondary | ICD-10-CM | POA: Diagnosis not present

## 2015-09-09 DIAGNOSIS — M179 Osteoarthritis of knee, unspecified: Secondary | ICD-10-CM | POA: Diagnosis not present

## 2015-09-26 ENCOUNTER — Emergency Department (HOSPITAL_COMMUNITY)
Admission: EM | Admit: 2015-09-26 | Discharge: 2015-09-26 | Disposition: A | Discharge status: Home / Self Care | Payer: Medicare Other | Attending: Emergency Medicine | Admitting: Emergency Medicine

## 2015-09-26 ENCOUNTER — Encounter (HOSPITAL_COMMUNITY): Payer: Self-pay | Admitting: Emergency Medicine

## 2015-09-26 DIAGNOSIS — Z7982 Long term (current) use of aspirin: Secondary | ICD-10-CM | POA: Insufficient documentation

## 2015-09-26 DIAGNOSIS — E119 Type 2 diabetes mellitus without complications: Secondary | ICD-10-CM | POA: Diagnosis not present

## 2015-09-26 DIAGNOSIS — Z7984 Long term (current) use of oral hypoglycemic drugs: Secondary | ICD-10-CM | POA: Diagnosis not present

## 2015-09-26 DIAGNOSIS — N23 Unspecified renal colic: Secondary | ICD-10-CM | POA: Diagnosis not present

## 2015-09-26 DIAGNOSIS — Z792 Long term (current) use of antibiotics: Secondary | ICD-10-CM | POA: Diagnosis not present

## 2015-09-26 DIAGNOSIS — Z79899 Other long term (current) drug therapy: Secondary | ICD-10-CM | POA: Insufficient documentation

## 2015-09-26 DIAGNOSIS — I10 Essential (primary) hypertension: Secondary | ICD-10-CM | POA: Insufficient documentation

## 2015-09-26 DIAGNOSIS — N2889 Other specified disorders of kidney and ureter: Secondary | ICD-10-CM

## 2015-09-26 DIAGNOSIS — R109 Unspecified abdominal pain: Secondary | ICD-10-CM | POA: Diagnosis present

## 2015-09-26 LAB — BASIC METABOLIC PANEL
ANION GAP: 8 (ref 5–15)
BUN: 21 mg/dL — AB (ref 6–20)
CHLORIDE: 100 mmol/L — AB (ref 101–111)
CO2: 27 mmol/L (ref 22–32)
Calcium: 8.9 mg/dL (ref 8.9–10.3)
Creatinine, Ser: 0.98 mg/dL (ref 0.44–1.00)
GFR calc Af Amer: >60 mL/min (ref >60)
GFR calc non Af Amer: 59 mL/min — ABNORMAL LOW (ref >60)
GLUCOSE: 135 mg/dL — AB (ref 65–99)
Potassium: 3.8 mmol/L (ref 3.5–5.1)
Sodium: 135 mmol/L (ref 135–145)

## 2015-09-26 LAB — URINALYSIS, ROUTINE W REFLEX MICROSCOPIC
Bilirubin Urine: NEGATIVE
GLUCOSE, UA: NEGATIVE mg/dL
Ketones, ur: NEGATIVE mg/dL
Nitrite: NEGATIVE
Protein, ur: NEGATIVE mg/dL
SPECIFIC GRAVITY, URINE: 1.018 (ref 1.005–1.030)
pH: 5 (ref 5.0–8.0)

## 2015-09-26 LAB — CBC WITH DIFFERENTIAL/PLATELET
Basophils Absolute: 0 10*3/uL (ref 0.0–0.1)
Basophils Relative: 0 %
EOS PCT: 2 %
Eosinophils Absolute: 0.2 10*3/uL (ref 0.0–0.7)
HCT: 36 % (ref 36.0–46.0)
Hemoglobin: 11.1 g/dL — ABNORMAL LOW (ref 12.0–15.0)
LYMPHS ABS: 2 10*3/uL (ref 0.7–4.0)
LYMPHS PCT: 19 %
MCH: 27.7 pg (ref 26.0–34.0)
MCHC: 30.8 g/dL (ref 30.0–36.0)
MCV: 89.8 fL (ref 78.0–100.0)
Monocytes Absolute: 0.5 10*3/uL (ref 0.1–1.0)
Monocytes Relative: 5 %
NEUTROS ABS: 7.8 10*3/uL — AB (ref 1.7–7.7)
Neutrophils Relative %: 74 %
Platelets: 364 10*3/uL (ref 150–400)
RBC: 4.01 MIL/uL (ref 3.87–5.11)
RDW: 13.7 % (ref 11.5–15.5)
WBC: 10.5 10*3/uL (ref 4.0–10.5)

## 2015-09-26 LAB — URINE MICROSCOPIC-ADD ON

## 2015-09-26 MED ORDER — HYDROCODONE-ACETAMINOPHEN 5-325 MG PO TABS: 1.0000 | ORAL_TABLET | Freq: Four times a day (QID) | ORAL | Status: DC | PRN

## 2015-09-26 MED ORDER — IBUPROFEN 800 MG PO TABS: 800.0000 mg | ORAL_TABLET | Freq: Three times a day (TID) | ORAL | Status: DC | PRN

## 2015-09-26 MED ORDER — TAMSULOSIN HCL 0.4 MG PO CAPS: 0.4000 mg | ORAL_CAPSULE | Freq: Every day | ORAL | Status: DC

## 2015-09-26 MED ORDER — ONDANSETRON HCL 4 MG/2ML IJ SOLN
4.0000 mg | Freq: Once | INTRAMUSCULAR | Status: AC
  Administered 2015-09-26: 4 mg via INTRAVENOUS
  Filled 2015-09-26: qty 2

## 2015-09-26 MED ORDER — MORPHINE SULFATE (PF) 4 MG/ML IV SOLN
4.0000 mg | Freq: Once | INTRAVENOUS | Status: AC
  Administered 2015-09-26: 4 mg via INTRAVENOUS
  Filled 2015-09-26: qty 1

## 2015-09-26 MED ORDER — SODIUM CHLORIDE 0.9 % IV BOLUS (SEPSIS)
1000.0000 mL | Freq: Once | INTRAVENOUS | Status: AC
  Administered 2015-09-26: 1000 mL via INTRAVENOUS

## 2015-09-26 MED ORDER — KETOROLAC TROMETHAMINE 30 MG/ML IJ SOLN
30.0000 mg | Freq: Once | INTRAMUSCULAR | Status: AC
  Administered 2015-09-26: 30 mg via INTRAVENOUS
  Filled 2015-09-26: qty 1

## 2015-09-26 NOTE — ED Notes (Addendum)
Pt from home c/o right flank pain. She reports feeling gassy. Denies dysuria or hematuria.  HX of kidney stones

## 2015-09-26 NOTE — ED Provider Notes (Signed)
CSN: [id number]     Arrival date & time 09/26/15  0530 History   First MD Initiated Contact with Patient 09/26/15 0557     Chief Complaint  Patient presents with  . Flank Pain    HPI   Debbie Velasquez is an 64 y.o. female with history of DM, HTN, and kidney stones who presents to the ED for evaluation of R flank pain. She states she woke up from sleep around 3AM this morning with right flank pain that radiates to her RUQ. She states that it feels like gas and she feels bloated. She took OTC gas medicine with no relief. She states that she has had two kidney stones before, once in 07/2015 and once last year, which both presented similarly. She currently denies any dysuria, urinary frequency/urgency, or gross hematuria. Endorses mild nausea but denies emesis. Endorses one episode of watery diarrhea this morning. Denies fever, chills, chest pain, SOB. She states she does not follow with urology and her pcp has monitored her kidney stones in the past.   Past Medical History  Diagnosis Date  . Diabetes mellitus without complication ([place])   . Hypertension   . Kidney stones     hx kidney stone 2015   Past Surgical History  Procedure Laterality Date  . Abdominal hysterectomy    . Cesarean section     Family History  Problem Relation Age of Onset  . Esophageal cancer Maternal Aunt   . Colon cancer Neg Hx   . Stomach cancer Neg Hx   . Rectal cancer Neg Hx    Social History  Substance Use Topics  . Smoking status: Never Smoker   . Smokeless tobacco: Never Used  . Alcohol Use: No   OB History    No data available     Review of Systems  All other systems reviewed and are negative.     Allergies  Review of patient's allergies indicates no known allergies.  Home Medications   Prior to Admission medications   Medication Sig Start Date End Date Taking? Authorizing Provider  acetaminophen (TYLENOL) 500 MG tablet Take 1,000 mg by mouth every 6 (six) hours as needed for mild pain or  headache.    Historical Provider, MD  alum hydroxide-mag trisilicate (GAVISCON) [id number] MG CHEW chewable tablet Chew 2 tablets by mouth 2 (two) times daily as needed for indigestion or heartburn.    Historical Provider, MD  aspirin EC 81 MG tablet Take 81 mg by mouth daily.    Historical Provider, MD  cephALEXin (KEFLEX) 500 MG capsule Take 500 mg by mouth 4 (four) times daily.    Historical Provider, MD  HYDROcodone-acetaminophen (NORCO/VICODIN) 5-325 MG tablet Take 1 tablet by mouth every 4 (four) hours as needed for severe pain. 08/13/15   Julianne Rice, MD  losartan (COZAAR) 25 MG tablet Take 25 mg by mouth daily.    Historical Provider, MD  meloxicam (MOBIC) 7.5 MG tablet Take 7.5 mg by mouth daily.    Historical Provider, MD  metFORMIN (GLUCOPHAGE) 500 MG tablet Take 500 mg by mouth 2 (two) times daily with a meal.    Historical Provider, MD  naproxen (NAPROSYN) 500 MG tablet Take 1 tablet (500 mg total) by mouth 2 (two) times daily. Patient not taking: Reported on 08/13/2015 [id number]   Delora Fuel, MD  ondansetron (ZOFRAN) 4 MG tablet Take 1 tablet (4 mg total) by mouth every 8 (eight) hours as needed for nausea or vomiting. 08/13/15   Julianne Rice, MD  tamsulosin (FLOMAX) 0.4 MG CAPS capsule Take 1 capsule (0.4 mg total) by mouth daily. 08/13/15   Julianne Rice, MD  traMADol (ULTRAM) 50 MG tablet Take 1 tablet (50 mg total) by mouth every 6 (six) hours as needed. Patient not taking: Reported on 08/13/2015 [id number]   Delora Fuel, MD   BP XX123456 mmHg  Pulse 69  Temp(Src) 97.7 F (36.5 C) (Oral)  Resp 20  Ht 5\' 4"  (1.626 m)  Wt 113.399 kg  BMI 42.89 kg/m2  SpO2 100% Physical Exam  Constitutional: She is oriented to person, place, and time. No distress.  HENT:  Right Ear: External ear normal.  Left Ear: External ear normal.  Nose: Nose normal.  Mouth/Throat: Oropharynx is clear and moist. No oropharyngeal exudate.  Eyes: Conjunctivae and EOM are normal. Pupils are equal, round,  and reactive to light.  Neck: Normal range of motion. Neck supple.  Cardiovascular: Normal rate, regular rhythm, normal heart sounds and intact distal pulses.   Pulmonary/Chest: Effort normal and breath sounds normal. No respiratory distress. She has no wheezes. She exhibits no tenderness.  Abdominal: Soft. Bowel sounds are normal. She exhibits no distension. There is no rigidity, no rebound and no guarding.  Tenderness along R lateral upper abdomen, but not really in RUQ. +R CVA tenderness.   Musculoskeletal: Normal range of motion. She exhibits no edema.  Neurological: She is alert and oriented to person, place, and time. She has normal strength. No cranial nerve deficit or sensory deficit.  Skin: Skin is warm and dry. She is not diaphoretic. There is pallor.  Psychiatric: She has a normal mood and affect.  Nursing note and vitals reviewed.   ED Course  Procedures (including critical care time) Labs Review Labs Reviewed  URINALYSIS, ROUTINE W REFLEX MICROSCOPIC (NOT AT [facility]) - Abnormal; Notable for the following:    APPearance CLOUDY (*)    Hgb urine dipstick LARGE (*)    Leukocytes, UA MODERATE (*)    All other components within normal limits  CBC WITH DIFFERENTIAL/PLATELET - Abnormal; Notable for the following:    Hemoglobin 11.1 (*)    Neutro Abs 7.8 (*)    All other components within normal limits  BASIC METABOLIC PANEL - Abnormal; Notable for the following:    Chloride 100 (*)    Glucose, Bld 135 (*)    BUN 21 (*)    GFR calc non Af Amer 59 (*)    All other components within normal limits  URINE MICROSCOPIC-ADD ON - Abnormal; Notable for the following:    Squamous Epithelial / LPF 0-5 (*)    Bacteria, UA MANY (*)    All other components within normal limits  URINE CULTURE    Imaging Review No results found. I have personally reviewed and evaluated these images and lab results as part of my medical decision-making.   EKG Interpretation None      MDM   Final  diagnoses:  Renal colic  Left kidney mass   UA with large hgb and moderate leuks. Bedside ultrasound shows evidence of R hydronephrosis suggesting stone as well as a possible mass on L kidney. Review CT from 07/2015 reveals left sided kidney mass as well. Pt apparently was not informed of this. PT instructed to follow up with urology as soon as possible for further evaluation. In the meantime will give rx for flomax, nsaids, and pain meds. ER return precautions given.  Anne Ng, PA-C 09/27/15 1531  Orpah Greek, MD 10/03/15 [phone]

## 2015-09-26 NOTE — ED Notes (Signed)
She is in no distress and tells [place] she is comfortable.  Dr. Darl Householder has performed bedside u/s.

## 2015-09-26 NOTE — Discharge Instructions (Signed)
You were evaluated in the ER today for right flank pain. Your bedside ultrasound shows evidence of kidney inflammation which usually is indicative of a kidney stone. I will give you a prescription for pain medicine as well as for flomax. Drink plenty of fluids. As we discussed, your CT scan from October also showed a small mass on your left kidney. Please follow-up with [facility] as soon as possible for further evaluation of this mass as well as to follow up on your kidney stone. Return to the ER for new or worsening symptoms.    Please obtain all of your results from medical records or have your doctors office obtain the results - share them with your doctor - you should be seen at your doctors office in the next 2 days. Call today to arrange your follow up. Take the medications as prescribed. Please review all of the medicines and only take them if you do not have an allergy to them. Please be aware that if you are taking birth control pills, taking other prescriptions, ESPECIALLY ANTIBIOTICS may make the birth control ineffective - if this is the case, either do not engage in sexual activity or use alternative methods of birth control such as condoms until you have finished the medicine and your family doctor says it is OK to restart them. If you are on a blood thinner such as COUMADIN, be aware that any other medicine that you take may cause the coumadin to either work too much, or not enough - you should have your coumadin level rechecked in next 7 days if this is the case.  ?  It is also a possibility that you have an allergic reaction to any of the medicines that you have been prescribed - Everybody reacts differently to medications and while MOST people have no trouble with most medicines, you may have a reaction such as nausea, vomiting, rash, swelling, shortness of breath. If this is the case, please stop taking the medicine immediately and contact your physician.  ?  You should return to the  ER if you develop severe or worsening symptoms.

## 2015-09-28 LAB — URINE CULTURE

## 2015-09-29 DIAGNOSIS — N201 Calculus of ureter: Secondary | ICD-10-CM | POA: Diagnosis not present

## 2015-09-29 DIAGNOSIS — D49512 Neoplasm of unspecified behavior of left kidney: Secondary | ICD-10-CM | POA: Diagnosis not present

## 2015-09-29 DIAGNOSIS — R3129 Other microscopic hematuria: Secondary | ICD-10-CM | POA: Diagnosis not present

## 2015-09-30 ENCOUNTER — Other Ambulatory Visit (HOSPITAL_COMMUNITY): Payer: Self-pay | Admitting: Urology

## 2015-09-30 DIAGNOSIS — Q639 Congenital malformation of kidney, unspecified: Secondary | ICD-10-CM

## 2015-10-06 DIAGNOSIS — M7541 Impingement syndrome of right shoulder: Secondary | ICD-10-CM | POA: Diagnosis not present

## 2015-10-06 DIAGNOSIS — M25562 Pain in left knee: Secondary | ICD-10-CM | POA: Diagnosis not present

## 2015-10-08 ENCOUNTER — Other Ambulatory Visit: Payer: Self-pay | Admitting: Physician Assistant

## 2015-10-08 DIAGNOSIS — I10 Essential (primary) hypertension: Secondary | ICD-10-CM | POA: Diagnosis not present

## 2015-10-08 DIAGNOSIS — N760 Acute vaginitis: Secondary | ICD-10-CM | POA: Diagnosis not present

## 2015-10-08 DIAGNOSIS — Z124 Encounter for screening for malignant neoplasm of cervix: Secondary | ICD-10-CM | POA: Diagnosis not present

## 2015-10-08 DIAGNOSIS — E119 Type 2 diabetes mellitus without complications: Secondary | ICD-10-CM | POA: Diagnosis not present

## 2015-10-08 DIAGNOSIS — M25511 Pain in right shoulder: Secondary | ICD-10-CM

## 2015-10-15 ENCOUNTER — Ambulatory Visit (HOSPITAL_COMMUNITY): Payer: Medicare Other

## 2015-10-15 ENCOUNTER — Ambulatory Visit (HOSPITAL_COMMUNITY)
Admission: RE | Admit: 2015-10-15 | Discharge: 2015-10-15 | Disposition: A | Discharge status: Home / Self Care | Payer: Medicare Other | Source: Ambulatory Visit | Attending: Urology | Admitting: Urology

## 2015-10-15 DIAGNOSIS — R935 Abnormal findings on diagnostic imaging of other abdominal regions, including retroperitoneum: Secondary | ICD-10-CM | POA: Diagnosis present

## 2015-10-15 DIAGNOSIS — Q639 Congenital malformation of kidney, unspecified: Secondary | ICD-10-CM

## 2015-10-15 DIAGNOSIS — D1803 Hemangioma of intra-abdominal structures: Secondary | ICD-10-CM | POA: Insufficient documentation

## 2015-10-15 DIAGNOSIS — K760 Fatty (change of) liver, not elsewhere classified: Secondary | ICD-10-CM | POA: Insufficient documentation

## 2015-10-15 DIAGNOSIS — N281 Cyst of kidney, acquired: Secondary | ICD-10-CM | POA: Diagnosis not present

## 2015-10-15 DIAGNOSIS — N2889 Other specified disorders of kidney and ureter: Secondary | ICD-10-CM | POA: Insufficient documentation

## 2015-10-15 MED ORDER — GADOBENATE DIMEGLUMINE 529 MG/ML IV SOLN
20.0000 mL | Freq: Once | INTRAVENOUS | Status: AC | PRN
  Administered 2015-10-15: 20 mL via INTRAVENOUS

## 2015-10-15 NOTE — ED Provider Notes (Signed)
Medical screening examination/treatment/procedure(s) were conducted as a shared visit with non-physician practitioner(s) and myself.  I personally evaluated the patient during the encounter.   EKG Interpretation None      Debbie Velasquez is a 64 y.o. female here with R flank pain. Was seen here several months ago for similar symptoms and had CT done that showed L renal mass. Here recurrent flank pain today. Slightly uncomfortable on exam. + R CVAT. Bedside US showed R hydro and L kidney solitary mass. Pain controlled with meds in the ED. UA + leuks. Afebrile, well appearing. I doubt infected stone. Will dc home with flomax, NSAIDs, pain meds. I emphasize to the patient that she needs follow up for the renal mass with urology.   EMERGENCY DEPARTMENT US RENAL EXAM  "Study: Limited Retroperitoneal Ultrasound of Kidneys"  INDICATIONS: Flank pain  Long and short axis of both kidneys were obtained.   PERFORMED BY: Myself  IMAGES ARCHIVED?: Yes  LIMITATIONS: Body habitus  VIEWS USED: Long axis and Short axis   INTERPRETATION: Right Hydronephrosis mild also L renal mass    CPT Code: [id number] (limited retroperitoneal)     Wandra Arthurs, MD 10/15/15 1301

## 2015-10-20 ENCOUNTER — Ambulatory Visit
Admission: RE | Admit: 2015-10-20 | Discharge: 2015-10-20 | Disposition: A | Discharge status: Home / Self Care | Payer: Medicare Other | Source: Ambulatory Visit | Attending: Physician Assistant | Admitting: Physician Assistant

## 2015-10-20 DIAGNOSIS — S46011A Strain of muscle(s) and tendon(s) of the rotator cuff of right shoulder, initial encounter: Secondary | ICD-10-CM | POA: Diagnosis not present

## 2015-10-20 DIAGNOSIS — M25511 Pain in right shoulder: Secondary | ICD-10-CM

## 2015-10-21 DIAGNOSIS — M7541 Impingement syndrome of right shoulder: Secondary | ICD-10-CM | POA: Diagnosis not present

## 2015-10-28 ENCOUNTER — Other Ambulatory Visit: Payer: Self-pay | Admitting: Urology

## 2015-11-16 ENCOUNTER — Encounter (HOSPITAL_BASED_OUTPATIENT_CLINIC_OR_DEPARTMENT_OTHER): Payer: Self-pay | Admitting: *Deleted

## 2015-11-16 NOTE — Progress Notes (Signed)
NPO AFTER MN. ARRIVE AT NO:[id number].  NEEDS ISTAT 8  AND EKG.

## 2015-11-19 ENCOUNTER — Other Ambulatory Visit: Payer: Self-pay | Admitting: Urology

## 2015-11-25 ENCOUNTER — Ambulatory Visit (HOSPITAL_BASED_OUTPATIENT_CLINIC_OR_DEPARTMENT_OTHER): Admission: RE | Admit: 2015-11-25 | Payer: Medicare Other | Source: Ambulatory Visit | Admitting: Urology

## 2015-11-25 HISTORY — DX: Presence of dental prosthetic device (complete) (partial): Z97.2

## 2015-11-25 HISTORY — DX: Calculus of ureter: N20.1

## 2015-11-25 HISTORY — DX: Presence of spectacles and contact lenses: Z97.3

## 2015-11-25 HISTORY — DX: Type 2 diabetes mellitus without complications: E11.9

## 2015-11-25 HISTORY — DX: Personal history of urinary calculi: Z87.442

## 2015-11-25 HISTORY — DX: Other specified disorders of kidney and ureter: N28.89

## 2015-11-25 HISTORY — DX: Unspecified osteoarthritis, unspecified site: M19.90

## 2015-11-25 SURGERY — CYSTOSCOPY, WITH CALCULUS REMOVAL USING BASKET
Anesthesia: General | Laterality: Bilateral

## 2015-12-14 ENCOUNTER — Encounter (HOSPITAL_COMMUNITY): Payer: Self-pay

## 2015-12-14 ENCOUNTER — Encounter (HOSPITAL_COMMUNITY)
Admission: RE | Admit: 2015-12-14 | Discharge: 2015-12-14 | Disposition: A | Discharge status: Home / Self Care | Payer: Medicare Other | Source: Ambulatory Visit | Attending: Urology | Admitting: Urology

## 2015-12-14 DIAGNOSIS — R9431 Abnormal electrocardiogram [ECG] [EKG]: Secondary | ICD-10-CM | POA: Insufficient documentation

## 2015-12-14 DIAGNOSIS — N289 Disorder of kidney and ureter, unspecified: Secondary | ICD-10-CM | POA: Diagnosis not present

## 2015-12-14 DIAGNOSIS — Z01818 Encounter for other preprocedural examination: Secondary | ICD-10-CM | POA: Insufficient documentation

## 2015-12-14 DIAGNOSIS — Z0183 Encounter for blood typing: Secondary | ICD-10-CM | POA: Diagnosis not present

## 2015-12-14 DIAGNOSIS — E119 Type 2 diabetes mellitus without complications: Secondary | ICD-10-CM | POA: Insufficient documentation

## 2015-12-14 DIAGNOSIS — Z01812 Encounter for preprocedural laboratory examination: Secondary | ICD-10-CM | POA: Diagnosis not present

## 2015-12-14 HISTORY — DX: Allergy status to unspecified drugs, medicaments and biological substances: Z88.9

## 2015-12-14 LAB — BASIC METABOLIC PANEL
Anion gap: 8 (ref 5–15)
BUN: 23 mg/dL — AB (ref 6–20)
CO2: 28 mmol/L (ref 22–32)
CREATININE: 1.11 mg/dL — AB (ref 0.44–1.00)
Calcium: 10 mg/dL (ref 8.9–10.3)
Chloride: 103 mmol/L (ref 101–111)
GFR calc Af Amer: 59 mL/min — ABNORMAL LOW (ref >60)
GFR, EST NON AFRICAN AMERICAN: 51 mL/min — AB (ref >60)
Glucose, Bld: 82 mg/dL (ref 65–99)
POTASSIUM: 4.7 mmol/L (ref 3.5–5.1)
SODIUM: 139 mmol/L (ref 135–145)

## 2015-12-14 LAB — CBC
HCT: 37.2 % (ref 36.0–46.0)
Hemoglobin: 11.4 g/dL — ABNORMAL LOW (ref 12.0–15.0)
MCH: 27.6 pg (ref 26.0–34.0)
MCHC: 30.6 g/dL (ref 30.0–36.0)
MCV: 90.1 fL (ref 78.0–100.0)
PLATELETS: 402 10*3/uL — AB (ref 150–400)
RBC: 4.13 MIL/uL (ref 3.87–5.11)
RDW: 13.3 % (ref 11.5–15.5)
WBC: 9.4 10*3/uL (ref 4.0–10.5)

## 2015-12-14 LAB — ABO/RH: ABO/RH(D): B POS

## 2015-12-14 NOTE — Patient Instructions (Signed)
Debbie Velasquez  12/14/2015   Your procedure is scheduled on: 12-22-15  Report to [facility] Main  Entrance take [facility]  elevators to 3rd floor to  [place] at  1030 AM.  Call this number if you have problems the morning of surgery [phone]   Remember: ONLY 1 PERSON MAY GO WITH YOU TO SHORT STAY TO GET  READY MORNING OF [place].  Do not eat food or drink liquids :After Midnight.     Take these medicines the morning of surgery with A SIP OF WATER: None. DO NOT TAKE ANY DIABETIC MEDICATIONS DAY OF YOUR SURGERY                               You may not have any metal on your body including hair pins and              piercings  Do not wear jewelry, make-up, lotions, powders or perfumes, deodorant             Do not wear nail polish.  Do not shave  48 hours prior to surgery.              Men may shave face and neck.   Do not bring valuables to the hospital. [place].  Contacts, dentures or bridgework may not be worn into surgery.  Leave suitcase in the car. After surgery it may be brought to your room.     Patients discharged the day of surgery will not be allowed to drive home.  Name and phone number of your driver: Debbie Velasquez -daughter [phone] cell  Special Instructions: N/A              Please read over the following fact sheets you were given: _____________________________________________________________________             [facility] - Preparing for Surgery Before surgery, you can play an important role.  Because skin is not sterile, your skin needs to be as free of germs as possible.  You can reduce the number of germs on your skin by washing with CHG (chlorahexidine gluconate) soap before surgery.  CHG is an antiseptic cleaner which kills germs and bonds with the skin to continue killing germs even after washing. Please DO NOT use if you have an allergy to CHG or antibacterial  soaps.  If your skin becomes reddened/irritated stop using the CHG and inform your nurse when you arrive at Short Stay. Do not shave (including legs and underarms) for at least 48 hours prior to the first CHG shower.  You may shave your face/neck. Please follow these instructions carefully:  1.  Shower with CHG Soap the night before surgery and the  morning of Surgery.  2.  If you choose to wash your hair, wash your hair first as usual with your  normal  shampoo.  3.  After you shampoo, rinse your hair and body thoroughly to remove the  shampoo.                           4.  Use CHG as you would any other liquid soap.  You can apply chg directly  to  the skin and wash                       Gently with a scrungie or clean washcloth.  5.  Apply the CHG Soap to your body ONLY FROM THE NECK DOWN.   Do not use on face/ open                           Wound or open sores. Avoid contact with eyes, ears mouth and genitals (private parts).                       Wash face,  Genitals (private parts) with your normal soap.             6.  Wash thoroughly, paying special attention to the area where your surgery  will be performed.  7.  Thoroughly rinse your body with warm water from the neck down.  8.  DO NOT shower/wash with your normal soap after using and rinsing off  the CHG Soap.                9.  Pat yourself dry with a clean towel.            10.  Wear clean pajamas.            11.  Place clean sheets on your bed the night of your first shower and do not  sleep with pets. Day of Surgery : Do not apply any lotions/deodorants the morning of surgery.  Please wear clean clothes to the hospital/surgery center.  FAILURE TO FOLLOW THESE INSTRUCTIONS MAY RESULT IN THE CANCELLATION OF YOUR SURGERY PATIENT SIGNATURE_________________________________  NURSE SIGNATURE__________________________________  ________________________________________________________________________

## 2015-12-15 LAB — HEMOGLOBIN A1C
HEMOGLOBIN A1C: 6 % — AB (ref 4.8–5.6)
Mean Plasma Glucose: 126 mg/dL

## 2015-12-16 NOTE — Pre-Procedure Instructions (Addendum)
12-16-15 1600 Dr. Delma Post reviewed EKG reading-"okay". 12-17-15 Received per fax EKG 12-01-14(Dr. Jeanie Cooks) placed with chart.

## 2015-12-18 NOTE — Anesthesia Preprocedure Evaluation (Addendum)
Anesthesia Evaluation  Patient identified by MRN, date of birth, ID band Patient awake    Reviewed: Allergy & Precautions, NPO status , Patient's Chart, lab work & pertinent test results  Airway Mallampati: II  TM Distance: >3 FB Neck ROM: Full    Dental  (+) Dental Advisory Given, Teeth Intact   Pulmonary neg pulmonary ROS,    Pulmonary exam normal        Cardiovascular hypertension, Pt. on medications Normal cardiovascular exam  EKG mild LVH, possible ant. Septal MI   Neuro/Psych negative neurological ROS  negative psych ROS   GI/Hepatic negative GI ROS, Neg liver ROS,   Endo/Other  diabetes, Type 2, Oral Hypoglycemic AgentsMorbid obesityBMI 42  Renal/GU negative Renal ROS  negative genitourinary   Musculoskeletal negative musculoskeletal ROS (+)   Abdominal (+) + obese,   Peds negative pediatric ROS (+)  Hematology negative hematology ROS (+) 11/37   Anesthesia Other Findings   Reproductive/Obstetrics negative OB ROS                           Anesthesia Physical Anesthesia Plan  ASA: III  Anesthesia Plan: General   Post-op Pain Management:    Induction: Intravenous  Airway Management Planned: Oral ETT and Video Laryngoscope Planned  Additional Equipment:   Intra-op Plan:   Post-operative Plan: Extubation in OR  Informed Consent: I have reviewed the patients History and Physical, chart, labs and discussed the procedure including the risks, benefits and alternatives for the proposed anesthesia with the patient or authorized representative who has indicated his/her understanding and acceptance.     Plan Discussed with:   Anesthesia Plan Comments: (Glide available, check that T & S valid)        Anesthesia Quick Evaluation

## 2015-12-22 ENCOUNTER — Encounter (HOSPITAL_COMMUNITY)
Admission: RE | Disposition: A | Discharge status: Home / Self Care | Payer: Self-pay | Source: Ambulatory Visit | Attending: Urology

## 2015-12-22 ENCOUNTER — Inpatient Hospital Stay (HOSPITAL_COMMUNITY): Payer: Medicare Other | Admitting: Anesthesiology

## 2015-12-22 ENCOUNTER — Encounter (HOSPITAL_COMMUNITY): Payer: Self-pay | Admitting: *Deleted

## 2015-12-22 ENCOUNTER — Inpatient Hospital Stay (HOSPITAL_COMMUNITY)
Admission: RE | Admit: 2015-12-22 | Discharge: 2015-12-24 | DRG: 657 | Disposition: A | Discharge status: Home / Self Care | Payer: Medicare Other | Source: Ambulatory Visit | Attending: Urology | Admitting: Urology

## 2015-12-22 DIAGNOSIS — Z01812 Encounter for preprocedural laboratory examination: Secondary | ICD-10-CM | POA: Diagnosis not present

## 2015-12-22 DIAGNOSIS — Z6841 Body Mass Index (BMI) 40.0 and over, adult: Secondary | ICD-10-CM | POA: Diagnosis not present

## 2015-12-22 DIAGNOSIS — I10 Essential (primary) hypertension: Secondary | ICD-10-CM | POA: Diagnosis present

## 2015-12-22 DIAGNOSIS — N2889 Other specified disorders of kidney and ureter: Secondary | ICD-10-CM | POA: Diagnosis present

## 2015-12-22 DIAGNOSIS — E119 Type 2 diabetes mellitus without complications: Secondary | ICD-10-CM | POA: Diagnosis present

## 2015-12-22 DIAGNOSIS — Z90710 Acquired absence of both cervix and uterus: Secondary | ICD-10-CM | POA: Diagnosis not present

## 2015-12-22 DIAGNOSIS — Z79899 Other long term (current) drug therapy: Secondary | ICD-10-CM

## 2015-12-22 DIAGNOSIS — C642 Malignant neoplasm of left kidney, except renal pelvis: Secondary | ICD-10-CM | POA: Diagnosis present

## 2015-12-22 HISTORY — PX: ROBOTIC ASSITED PARTIAL NEPHRECTOMY: SHX6087

## 2015-12-22 LAB — GLUCOSE, CAPILLARY
GLUCOSE-CAPILLARY: 114 mg/dL — AB (ref 65–99)
GLUCOSE-CAPILLARY: 156 mg/dL — AB (ref 65–99)
Glucose-Capillary: 86 mg/dL (ref 65–99)
Glucose-Capillary: 110 mg/dL — ABNORMAL HIGH (ref 65–99)

## 2015-12-22 LAB — HEMOGLOBIN AND HEMATOCRIT, BLOOD
HEMATOCRIT: 33.3 % — AB (ref 36.0–46.0)
HEMOGLOBIN: 10.2 g/dL — AB (ref 12.0–15.0)

## 2015-12-22 LAB — TYPE AND SCREEN
ABO/RH(D): B POS
Antibody Screen: NEGATIVE

## 2015-12-22 SURGERY — NEPHRECTOMY, PARTIAL, ROBOT-ASSISTED
Anesthesia: General | Site: Abdomen | Laterality: Left

## 2015-12-22 MED ORDER — PROPOFOL 10 MG/ML IV BOLUS
INTRAVENOUS | Status: DC | PRN
  Administered 2015-12-22: 150 mg via INTRAVENOUS

## 2015-12-22 MED ORDER — FENTANYL CITRATE (PF) 100 MCG/2ML IJ SOLN
INTRAMUSCULAR | Status: DC | PRN
  Administered 2015-12-22: 50 ug via INTRAVENOUS
  Administered 2015-12-22: 100 ug via INTRAVENOUS
  Administered 2015-12-22 (×2): 50 ug via INTRAVENOUS

## 2015-12-22 MED ORDER — PROMETHAZINE HCL 25 MG/ML IJ SOLN: 6.2500 mg | INTRAMUSCULAR | Status: DC | PRN

## 2015-12-22 MED ORDER — HYDROMORPHONE HCL 1 MG/ML IJ SOLN
INTRAMUSCULAR | Status: AC
  Filled 2015-12-22: qty 1

## 2015-12-22 MED ORDER — ONDANSETRON HCL 4 MG/2ML IJ SOLN: 4.0000 mg | INTRAMUSCULAR | Status: DC | PRN

## 2015-12-22 MED ORDER — LACTATED RINGERS IV SOLN
INTRAVENOUS | Status: DC | PRN
  Administered 2015-12-22 (×3): via INTRAVENOUS

## 2015-12-22 MED ORDER — MANNITOL 25 % IV SOLN
25.0000 g | Freq: Once | INTRAVENOUS | Status: AC
  Administered 2015-12-22 (×2): 12.5 g via INTRAVENOUS
  Filled 2015-12-22: qty 100

## 2015-12-22 MED ORDER — HYDROMORPHONE HCL 1 MG/ML IJ SOLN
0.2500 mg | INTRAMUSCULAR | Status: DC | PRN
  Administered 2015-12-22 (×2): 0.25 mg via INTRAVENOUS
  Administered 2015-12-22 (×2): 0.5 mg via INTRAVENOUS
  Administered 2015-12-22: 0.25 mg via INTRAVENOUS

## 2015-12-22 MED ORDER — ONDANSETRON HCL 4 MG/2ML IJ SOLN
INTRAMUSCULAR | Status: DC | PRN
  Administered 2015-12-22: 4 mg via INTRAVENOUS

## 2015-12-22 MED ORDER — LIDOCAINE HCL (CARDIAC) 20 MG/ML IV SOLN
INTRAVENOUS | Status: AC
  Filled 2015-12-22: qty 5

## 2015-12-22 MED ORDER — MEPERIDINE HCL 50 MG/ML IJ SOLN: 6.2500 mg | INTRAMUSCULAR | Status: DC | PRN

## 2015-12-22 MED ORDER — INSULIN ASPART 100 UNIT/ML ~~LOC~~ SOLN: 0.0000 [IU] | Freq: Three times a day (TID) | SUBCUTANEOUS | Status: DC

## 2015-12-22 MED ORDER — SUGAMMADEX SODIUM 500 MG/5ML IV SOLN
INTRAVENOUS | Status: AC
  Filled 2015-12-22: qty 5

## 2015-12-22 MED ORDER — HYDROMORPHONE HCL 1 MG/ML IJ SOLN: 0.5000 mg | INTRAMUSCULAR | Status: DC | PRN

## 2015-12-22 MED ORDER — MIDAZOLAM HCL 5 MG/5ML IJ SOLN
INTRAMUSCULAR | Status: DC | PRN
  Administered 2015-12-22: 2 mg via INTRAVENOUS

## 2015-12-22 MED ORDER — DIPHENHYDRAMINE HCL 12.5 MG/5ML PO ELIX: 12.5000 mg | ORAL_SOLUTION | Freq: Four times a day (QID) | ORAL | Status: DC | PRN

## 2015-12-22 MED ORDER — DEXAMETHASONE SODIUM PHOSPHATE 10 MG/ML IJ SOLN
INTRAMUSCULAR | Status: DC | PRN
  Administered 2015-12-22 (×2): 10 mg via INTRAVENOUS

## 2015-12-22 MED ORDER — LACTATED RINGERS IR SOLN
Status: DC | PRN
  Administered 2015-12-22: 1000 mL

## 2015-12-22 MED ORDER — ONDANSETRON HCL 4 MG/2ML IJ SOLN
INTRAMUSCULAR | Status: AC
  Filled 2015-12-22: qty 2

## 2015-12-22 MED ORDER — LOSARTAN POTASSIUM 50 MG PO TABS
100.0000 mg | ORAL_TABLET | Freq: Every day | ORAL | Status: DC
  Administered 2015-12-23 – 2015-12-24 (×2): 100 mg via ORAL
  Filled 2015-12-22 (×2): qty 2

## 2015-12-22 MED ORDER — SUCCINYLCHOLINE CHLORIDE 20 MG/ML IJ SOLN
INTRAMUSCULAR | Status: DC | PRN
  Administered 2015-12-22: 180 mg via INTRAVENOUS

## 2015-12-22 MED ORDER — CEFAZOLIN SODIUM-DEXTROSE 2-3 GM-% IV SOLR: 2.0000 g | INTRAVENOUS | Status: DC

## 2015-12-22 MED ORDER — LACTATED RINGERS IV SOLN: INTRAVENOUS | Status: DC

## 2015-12-22 MED ORDER — ROCURONIUM BROMIDE 100 MG/10ML IV SOLN
INTRAVENOUS | Status: DC | PRN
  Administered 2015-12-22: 10 mg via INTRAVENOUS
  Administered 2015-12-22: 50 mg via INTRAVENOUS

## 2015-12-22 MED ORDER — SUGAMMADEX SODIUM 500 MG/5ML IV SOLN
INTRAVENOUS | Status: DC | PRN
  Administered 2015-12-22: 250 mg via INTRAVENOUS

## 2015-12-22 MED ORDER — ROCURONIUM BROMIDE 100 MG/10ML IV SOLN
INTRAVENOUS | Status: AC
  Filled 2015-12-22: qty 1

## 2015-12-22 MED ORDER — SODIUM CHLORIDE 0.45 % IV SOLN
INTRAVENOUS | Status: DC
  Administered 2015-12-22: 1000 mL via INTRAVENOUS
  Administered 2015-12-23: 01:00:00 via INTRAVENOUS

## 2015-12-22 MED ORDER — FENTANYL CITRATE (PF) 250 MCG/5ML IJ SOLN
INTRAMUSCULAR | Status: AC
  Filled 2015-12-22: qty 5

## 2015-12-22 MED ORDER — OXYCODONE HCL 5 MG PO TABS
5.0000 mg | ORAL_TABLET | ORAL | Status: DC | PRN
  Administered 2015-12-22 – 2015-12-23 (×5): 5 mg via ORAL
  Filled 2015-12-22 (×6): qty 1

## 2015-12-22 MED ORDER — STERILE WATER FOR IRRIGATION IR SOLN
Status: DC | PRN
  Administered 2015-12-22: 1000 mL

## 2015-12-22 MED ORDER — MIDAZOLAM HCL 2 MG/2ML IJ SOLN
INTRAMUSCULAR | Status: AC
  Filled 2015-12-22: qty 2

## 2015-12-22 MED ORDER — LOSARTAN POTASSIUM-HCTZ 100-12.5 MG PO TABS: 1.0000 | ORAL_TABLET | Freq: Every morning | ORAL | Status: DC

## 2015-12-22 MED ORDER — DIPHENHYDRAMINE HCL 50 MG/ML IJ SOLN: 12.5000 mg | Freq: Four times a day (QID) | INTRAMUSCULAR | Status: DC | PRN

## 2015-12-22 MED ORDER — PROPOFOL 10 MG/ML IV BOLUS
INTRAVENOUS | Status: AC
  Filled 2015-12-22: qty 20

## 2015-12-22 MED ORDER — HYDROCHLOROTHIAZIDE 12.5 MG PO CAPS
12.5000 mg | ORAL_CAPSULE | Freq: Every day | ORAL | Status: DC
Start: 2015-12-23 — End: 2015-12-24
  Administered 2015-12-23 – 2015-12-24 (×2): 12.5 mg via ORAL
  Filled 2015-12-22 (×2): qty 1

## 2015-12-22 MED ORDER — FENTANYL CITRATE (PF) 100 MCG/2ML IJ SOLN: 25.0000 ug | INTRAMUSCULAR | Status: DC | PRN

## 2015-12-22 MED ORDER — CEFAZOLIN SODIUM-DEXTROSE 2-3 GM-% IV SOLR
INTRAVENOUS | Status: AC
  Filled 2015-12-22: qty 50

## 2015-12-22 MED ORDER — METFORMIN HCL 500 MG PO TABS
500.0000 mg | ORAL_TABLET | Freq: Two times a day (BID) | ORAL | Status: DC
  Administered 2015-12-23 – 2015-12-24 (×3): 500 mg via ORAL
  Filled 2015-12-22 (×3): qty 1

## 2015-12-22 MED ORDER — BUPIVACAINE LIPOSOME 1.3 % IJ SUSP
20.0000 mL | Freq: Once | INTRAMUSCULAR | Status: AC
  Administered 2015-12-22: 20 mL
  Filled 2015-12-22: qty 20

## 2015-12-22 MED ORDER — LIDOCAINE HCL (CARDIAC) 20 MG/ML IV SOLN
INTRAVENOUS | Status: DC | PRN
  Administered 2015-12-22: 50 mg via INTRAVENOUS

## 2015-12-22 MED ORDER — CEFAZOLIN SODIUM-DEXTROSE 2-3 GM-% IV SOLR
2.0000 g | INTRAVENOUS | Status: AC
  Administered 2015-12-22: 2 g via INTRAVENOUS

## 2015-12-22 MED ORDER — ACETAMINOPHEN 500 MG PO TABS
1000.0000 mg | ORAL_TABLET | Freq: Four times a day (QID) | ORAL | Status: AC
  Administered 2015-12-22 – 2015-12-23 (×3): 1000 mg via ORAL
  Filled 2015-12-22 (×4): qty 2

## 2015-12-22 MED ORDER — HYDROCODONE-ACETAMINOPHEN 5-325 MG PO TABS: 1.0000 | ORAL_TABLET | Freq: Four times a day (QID) | ORAL | Status: DC | PRN

## 2015-12-22 MED ORDER — ACETAMINOPHEN 325 MG PO TABS
650.0000 mg | ORAL_TABLET | ORAL | Status: DC | PRN
  Administered 2015-12-24: 650 mg via ORAL
  Filled 2015-12-22: qty 2

## 2015-12-22 MED ORDER — SODIUM CHLORIDE 0.9 % IJ SOLN
INTRAMUSCULAR | Status: AC
  Filled 2015-12-22: qty 20

## 2015-12-22 SURGICAL SUPPLY — 64 items
APL ESCP 34 STRL LF DISP (HEMOSTASIS) ×1
APPLICATOR SURGIFLO ENDO (HEMOSTASIS) ×3 IMPLANT
BAG SPEC RTRVL LRG 6X4 10 (ENDOMECHANICALS) ×1
CHLORAPREP W/TINT 26ML (MISCELLANEOUS) ×3 IMPLANT
CLIP LIGATING HEM O LOK PURPLE (MISCELLANEOUS) ×7 IMPLANT
CLIP LIGATING HEMO LOK XL GOLD (MISCELLANEOUS) IMPLANT
CLIP LIGATING HEMO O LOK GREEN (MISCELLANEOUS) ×6 IMPLANT
CLIP SUT LAPRA TY ABSORB (SUTURE) ×3 IMPLANT
COVER TIP SHEARS 8 DVNC (MISCELLANEOUS) ×1 IMPLANT
COVER TIP SHEARS 8MM DA VINCI (MISCELLANEOUS) ×2
DECANTER SPIKE VIAL GLASS SM (MISCELLANEOUS) ×3 IMPLANT
DRAIN CHANNEL 15F RND FF 3/16 (WOUND CARE) ×3 IMPLANT
DRAPE COLUMN DVNC XI (DISPOSABLE) ×1 IMPLANT
DRAPE DA VINCI XI COLUMN (DISPOSABLE) ×2
DRAPE INCISE IOBAN 66X45 STRL (DRAPES) ×3 IMPLANT
DRAPE LAPAROSCOPIC ABDOMINAL (DRAPES) ×3 IMPLANT
DRAPE SHEET LG 3/4 BI-LAMINATE (DRAPES) ×3 IMPLANT
ELECT PENCIL ROCKER SW 15FT (MISCELLANEOUS) ×3 IMPLANT
ELECT REM PT RETURN 9FT ADLT (ELECTROSURGICAL) ×3
ELECTRODE REM PT RTRN 9FT ADLT (ELECTROSURGICAL) ×1 IMPLANT
EVACUATOR SILICONE 100CC (DRAIN) ×3 IMPLANT
GLOVE BIO SURGEON STRL SZ 6.5 (GLOVE) ×2 IMPLANT
GLOVE BIO SURGEONS STRL SZ 6.5 (GLOVE) ×1
GLOVE BIOGEL M STRL SZ7.5 (GLOVE) ×6 IMPLANT
GOWN STRL REUS W/TWL LRG LVL3 (GOWN DISPOSABLE) ×6 IMPLANT
HEMOSTAT SURGICEL 4X8 (HEMOSTASIS) ×3 IMPLANT
KIT BASIN OR (CUSTOM PROCEDURE TRAY) ×3 IMPLANT
LIQUID BAND (GAUZE/BANDAGES/DRESSINGS) ×3 IMPLANT
LOOP VESSEL MAXI BLUE (MISCELLANEOUS) ×3 IMPLANT
MARKER SKIN DUAL TIP RULER LAB (MISCELLANEOUS) ×3 IMPLANT
NDL INSUFFLATION 14GA 120MM (NEEDLE) ×1 IMPLANT
NEEDLE INSUFFLATION 14GA 120MM (NEEDLE) ×3 IMPLANT
NS IRRIG 1000ML POUR BTL (IV SOLUTION) ×3 IMPLANT
POSITIONER SURGICAL ARM (MISCELLANEOUS) ×6 IMPLANT
POUCH SPECIMEN RETRIEVAL 10MM (ENDOMECHANICALS) ×3 IMPLANT
RELOAD STAPLE 60 2.6 WHT THN (STAPLE) IMPLANT
RELOAD STAPLER WHITE 60MM (STAPLE) IMPLANT
SET TRI-LUMEN FLTR TB AIRSEAL (TUBING) ×2 IMPLANT
SET TUBE IRRIG SUCTION NO TIP (IRRIGATION / IRRIGATOR) ×2 IMPLANT
SOLUTION ELECTROLUBE (MISCELLANEOUS) ×3 IMPLANT
SPONGE LAP 4X18 X RAY DECT (DISPOSABLE) ×1 IMPLANT
STAPLE ECHEON FLEX 60 POW ENDO (STAPLE) IMPLANT
STAPLER RELOAD WHITE 60MM (STAPLE)
SURGIFLO W/THROMBIN 8M KIT (HEMOSTASIS) ×3 IMPLANT
SUT ETHILON 3 0 PS 1 (SUTURE) ×3 IMPLANT
SUT MNCRL AB 4-0 PS2 18 (SUTURE) ×6 IMPLANT
SUT PDS AB 1 CT1 27 (SUTURE) ×6 IMPLANT
SUT V-LOC BARB 180 2/0GR6 GS22 (SUTURE)
SUT VIC AB 0 CT1 27 (SUTURE) ×12
SUT VIC AB 0 CT1 27XBRD ANTBC (SUTURE) ×4 IMPLANT
SUT VIC AB 2-0 SH 27 (SUTURE) ×6
SUT VIC AB 2-0 SH 27X BRD (SUTURE) ×2 IMPLANT
SUT VLOC BARB 180 ABS3/0GR12 (SUTURE) ×3
SUTURE V-LC BRB 180 2/0GR6GS22 (SUTURE) IMPLANT
SUTURE VLOC BRB 180 ABS3/0GR12 (SUTURE) ×1 IMPLANT
TAPE STRIPS DRAPE STRL (GAUZE/BANDAGES/DRESSINGS) ×3 IMPLANT
TOWEL OR 17X26 10 PK STRL BLUE (TOWEL DISPOSABLE) ×6 IMPLANT
TOWEL OR NON WOVEN STRL DISP B (DISPOSABLE) ×3 IMPLANT
TRAY FOLEY W/METER SILVER 14FR (SET/KITS/TRAYS/PACK) ×3 IMPLANT
TRAY FOLEY W/METER SILVER 16FR (SET/KITS/TRAYS/PACK) ×3 IMPLANT
TRAY LAPAROSCOPIC (CUSTOM PROCEDURE TRAY) ×3 IMPLANT
TROCAR BLADELESS OPT 5 100 (ENDOMECHANICALS) IMPLANT
TROCAR XCEL 12X100 BLDLESS (ENDOMECHANICALS) ×3 IMPLANT
WATER STERILE IRR 1500ML POUR (IV SOLUTION) ×6 IMPLANT

## 2015-12-22 NOTE — Discharge Instructions (Signed)
1.  Activity:  You are encouraged to ambulate frequently (about every hour during waking hours) to help prevent blood clots from forming in your legs or lungs.  However, you should not engage in any heavy lifting (> 10-15 lbs), strenuous activity, or straining. °2. Diet: You should advance your diet as instructed by your physician.  It will be normal to have some bloating, nausea, and abdominal discomfort intermittently. °3. Prescriptions:  You will be provided a prescription for pain medication to take as needed.  If your pain is not severe enough to require the prescription pain medication, you may take extra strength Tylenol instead which will have less side effects.  You should also take a prescribed stool softener to avoid straining with bowel movements as the prescription pain medication may constipate you. °4. Incisions: You may remove your dressing bandages 48 hours after surgery if not removed in the hospital.  You will either have some small staples or special tissue glue at each of the incision sites. Once the bandages are removed (if present), the incisions may stay open to air.  You may start showering (but not soaking or bathing in water) the 2nd day after surgery and the incisions simply need to be patted dry after the shower.  No additional care is needed. °5. What to call us about: You should call the office ([phone]) if you develop fever > 101 or develop persistent vomiting. ° ° °You may resume aspirin, advil, aleve, vitamins, supplements, and mobic 7 days after surgery. °

## 2015-12-22 NOTE — H&P (Signed)
Debbie Velasquez is an 65 y.o. female.    Chief Complaint: Pre-OP Left partial nephrectomy  HPI:   1 - Left renal mass - 3cm left mid posterior mass incidental on  ER CT 07/2015 on eval right ureteral stone. Dedicated renal MRI 09/2015 confirms enhancing solid mass with restricted difusion c/w renal cancer, clinically localized.   PMH sig for morbid obesity, DM2 (no neuropathy), benign hyst. Her PCP is Vira Agar MD  Today "[name] " is seen to proceed with left robotic partial nephrectomy  Past Medical History  Diagnosis Date  . Hypertension   . Type 2 diabetes mellitus ([place])   . Right ureteral stone   . Left renal mass   . Wears glasses   . Wears partial dentures     upper  . Arthritis   . Hx of seasonal allergies     rare flare ups  . History of kidney stones     x3 -remains with one on right.     Past Surgical History  Procedure Laterality Date  . Cesarean section  yrs ago  . Total abdominal hysterectomy w/ bilateral salpingoophorectomy  1980's    Family History  Problem Relation Age of Onset  . Esophageal cancer Maternal Aunt   . Colon cancer Neg Hx   . Stomach cancer Neg Hx   . Rectal cancer Neg Hx    Social History:  reports that she has never smoked. She has never used smokeless tobacco. She reports that she does not drink alcohol or use illicit drugs.  Allergies: No Known Allergies  No prescriptions prior to admission    No results found for this or any previous visit (from the past 48 hour(s)). No results found.  Review of Systems  Constitutional: Negative.   HENT: Negative.   Eyes: Negative.   Respiratory: Negative.   Cardiovascular: Negative.   Gastrointestinal: Negative.   Genitourinary: Negative.   Musculoskeletal: Negative.   Skin: Negative.   Neurological: Negative.   Endo/Heme/Allergies: Negative.   Psychiatric/Behavioral: Negative.     There were no vitals taken for this visit. Physical Exam  Constitutional: She appears  well-developed.  HENT:  Head: Normocephalic.  Eyes: Pupils are equal, round, and reactive to light.  Neck: Normal range of motion.  Cardiovascular: Normal rate.   Respiratory: Effort normal.  GI:  Significant truncal obesity with panus  Genitourinary:  NO CVAT  Musculoskeletal: Normal range of motion.  Neurological: She is alert.  Skin: Skin is warm.  Psychiatric: She has a normal mood and affect. Her behavior is normal. Judgment and thought content normal.     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 Size near 3cm would make surveillance potentially risky. She wants to proceed as planned with partial nephrectomy.  We rediscussed the role of partial nephrectomy with the overall goals being a balance of trying to achieve complete surgical excision (negative margins) while minimizing loss of normally functioning kidney. We then rediscussed surgical approaches including robotic and open techniques with robotic associated with a shorter convalescence. I showed the patient on their abdomen the approximately 4-6 incision (trocar) sites as well as presumed extraction sites with robotic approach as well as possible open incision sites. We specifically readdressed that there may be need to alter operative plans according to intraopertive findings including conversion to open procedure or conversion to radical nephrectomy as well as need for adjunctive procedures such as ureteral stenting to promote correct renal healing. We rediscussed  specific peri-operative risks including bleeding, infection, deep vein thrombosis, pulmonary embolism, compartment syndrome, neuropathy / neuropraxia, heart attack, stroke, death, as well as long-term risks such as non-cure / need for additional therapy and need for imaging and lab based post-op surveillance protocols. We rediscussed typical hospital course of approximately 2 day hospitalization, need for peri-operative drains /  catheters, and typical post-hospital course with return to most non-strenuous activities by 2 weeks and ability to return to most jobs and more strenuous activity such as exercise by 6 weeks.   Also reiterated that her significant obesity and diabetes place her at at least 2-5X increase risk of ALL perioperative complications.   Alexis Frock, MD 12/22/2015, 7:10 AM

## 2015-12-22 NOTE — Progress Notes (Signed)
Floor notified pt will be in 1435 in 15 minutes

## 2015-12-22 NOTE — Transfer of Care (Signed)
Immediate Anesthesia Transfer of Care Note  Patient: Debbie Velasquez  Procedure(s) Performed: Procedure(s): XI ROBOTIC ASSITED PARTIAL NEPHRECTOMY (Left)  Patient Location: PACU  Anesthesia Type:General  Level of Consciousness: awake, alert  and oriented  Airway & Oxygen Therapy: Patient Spontanous Breathing and Patient connected to face mask oxygen  Post-op Assessment: Report given to RN and Post -op Vital signs reviewed and stable  Post vital signs: Reviewed and stable  Last Vitals:  Filed Vitals:   12/22/15 1118  BP: 156/83  Pulse: 88  Temp: 36.8 C  Resp: 18    Complications: No apparent anesthesia complications

## 2015-12-22 NOTE — Anesthesia Postprocedure Evaluation (Signed)
Anesthesia Post Note  Patient: Debbie Velasquez  Procedure(s) Performed: Procedure(s) (LRB): XI ROBOTIC ASSITED PARTIAL NEPHRECTOMY (Left)  Patient location during evaluation: PACU Anesthesia Type: General Level of consciousness: awake and alert, oriented and patient cooperative Pain management: pain level controlled Vital Signs Assessment: post-procedure vital signs reviewed and stable Respiratory status: spontaneous breathing, nonlabored ventilation, respiratory function stable and patient connected to nasal cannula oxygen Cardiovascular status: blood pressure returned to baseline and stable Postop Assessment: no signs of nausea or vomiting Anesthetic complications: no    Last Vitals:  Filed Vitals:   12/22/15 1745 12/22/15 1812  BP: 139/78 148/66  Pulse: 61 63  Temp: 36.3 C 36.3 C  Resp: 11 17    Last Pain:  Filed Vitals:   12/22/15 1838  PainSc: 2                  Pius Byrom,E. Eudelia Hiltunen

## 2015-12-22 NOTE — Brief Op Note (Signed)
12/22/2015  4:21 PM  PATIENT:  Debbie Velasquez  65 y.o. female  PRE-OPERATIVE DIAGNOSIS:  LEFT RENAL MASS  POST-OPERATIVE DIAGNOSIS:  left renal mass  PROCEDURE:  Procedure(s): XI ROBOTIC ASSITED PARTIAL NEPHRECTOMY (Left)  SURGEON:  Surgeon(s) and Role:    * Alexis Frock, MD - Primary  PHYSICIAN ASSISTANT:   ASSISTANTS: Debbrah Alar, PA   ANESTHESIA:   local and general  EBL:  Total I/O In: 2000 [I.V.:2000] Out: 350 [Urine:200; Blood:150]  BLOOD ADMINISTERED:none  DRAINS: foley to gravity, JP to bulb   LOCAL MEDICATIONS USED:  MARCAINE     SPECIMEN:  Source of Specimen:  Left renal mass, deep margin left renal mass  DISPOSITION OF SPECIMEN:  PATHOLOGY  COUNTS:  YES  TOURNIQUET:  * No tourniquets in log *  DICTATION: .Other Dictation: Dictation Number [id number]  PLAN OF CARE: Admit to inpatient   PATIENT DISPOSITION:  PACU - hemodynamically stable.   Delay start of Pharmacological VTE agent (>24hrs) due to surgical blood loss or risk of bleeding: yes

## 2015-12-22 NOTE — Anesthesia Procedure Notes (Signed)

## 2015-12-23 ENCOUNTER — Encounter (HOSPITAL_COMMUNITY): Payer: Self-pay | Admitting: Urology

## 2015-12-23 LAB — BASIC METABOLIC PANEL
Anion gap: 9 (ref 5–15)
BUN: 19 mg/dL (ref 6–20)
CHLORIDE: 102 mmol/L (ref 101–111)
CO2: 23 mmol/L (ref 22–32)
CREATININE: 0.96 mg/dL (ref 0.44–1.00)
Calcium: 8.4 mg/dL — ABNORMAL LOW (ref 8.9–10.3)
GFR calc Af Amer: >60 mL/min (ref >60)
Glucose, Bld: 154 mg/dL — ABNORMAL HIGH (ref 65–99)
Potassium: 4 mmol/L (ref 3.5–5.1)
SODIUM: 134 mmol/L — AB (ref 135–145)

## 2015-12-23 LAB — HEMOGLOBIN AND HEMATOCRIT, BLOOD
HCT: 31.8 % — ABNORMAL LOW (ref 36.0–46.0)
HEMOGLOBIN: 9.8 g/dL — AB (ref 12.0–15.0)

## 2015-12-23 LAB — GLUCOSE, CAPILLARY
GLUCOSE-CAPILLARY: 91 mg/dL (ref 65–99)
Glucose-Capillary: 124 mg/dL — ABNORMAL HIGH (ref 65–99)
Glucose-Capillary: 102 mg/dL — ABNORMAL HIGH (ref 65–99)
Glucose-Capillary: 93 mg/dL (ref 65–99)

## 2015-12-23 NOTE — Progress Notes (Signed)
1 Day Post-Op  Subjective:  1 - Left renal mass - s/p left robotic partial nephrectomy 12/22/15 for 3cm left mid posterior mass. Path pending. JP and foley removed 3/2 as JP output scant.   Today "Debbie Velasquez " is progressing. Hgb and Cr acceptable. Tollerating PO intake, pain controlled.   Objective: Vital signs in last 24 hours: Temp:  [97.3 F (36.3 C)-97.8 F (36.6 C)] 97.5 F (36.4 C) (03/02 0557) Pulse Rate:  [58-82] 71 (03/02 0849) Resp:  [11-18] 18 (03/02 0557) BP: (114-160)/(54-83) 114/58 mmHg (03/02 0849) SpO2:  [96 %-100 %] 99 % (03/02 0557) Last BM Date: 12/22/15  Intake/Output from previous day: 03/01 0701 - 03/02 0700 In: 4277.1 [I.V.:4277.1] Out: 1205 [Urine:1025; Drains:30; Blood:150] Intake/Output this shift: Total I/O In: -  Out: 350 [Urine:350]  General appearance: alert, cooperative and appears stated age Eyes: negative Nose: Nares normal. Septum midline. Mucosa normal. No drainage or sinus tenderness. Throat: lips, mucosa, and tongue normal; teeth and gums normal Neck: supple, symmetrical, trachea midline Back: symmetric, no curvature. ROM normal. No CVA tenderness. Resp: non-labored on minimal NCO2 Cardio: Nl rate GI: soft, non-tender; bowel sounds normal; no masses,  no organomegaly Female genitalia: normal Extremities: extremities normal, atraumatic, no cyanosis or edema Pulses: 2+ and symmetric Skin: Skin color, texture, turgor normal. No rashes or lesions Lymph nodes: Cervical, supraclavicular, and axillary nodes normal. Neurologic: Grossly normal Incision/Wound: Recent port sites c/d/i. JP with scant serosanguinous output, removed and dry dressing applied. Inspected and intact.   Lab Results:   Recent Labs  12/22/15 1654 12/23/15 0454  HGB 10.2* 9.8*  HCT 33.3* 31.8*   BMET  Recent Labs  12/23/15 0454  NA 134*  K 4.0  CL 102  CO2 23  GLUCOSE 154*  BUN 19  CREATININE 0.96  CALCIUM 8.4*   PT/INR No results for input(s): LABPROT, INR in  the last 72 hours. ABG No results for input(s): PHART, HCO3 in the last 72 hours.  Invalid input(s): PCO2, PO2  Studies/Results: No results found.  Anti-infectives: Anti-infectives    Start     Dose/Rate Route Frequency Ordered Stop   12/22/15 1052  ceFAZolin (ANCEF) IVPB 2 g/50 mL premix  Status:  Discontinued     2 g 100 mL/hr over 30 Minutes Intravenous 30 min pre-op 12/22/15 1052 12/22/15 1055   12/22/15 1052  ceFAZolin (ANCEF) IVPB 2 g/50 mL premix     2 g 100 mL/hr over 30 Minutes Intravenous 30 min pre-op 12/22/15 1052 12/22/15 1400      Assessment/Plan:  1 - Left renal mass - doing well POD 1. Discussed DC criteria and will plan for DC tomorrow AM as long as progressing. Encouraged ambulation.   [facility] [place], Carlyann Placide 12/23/2015

## 2015-12-23 NOTE — Op Note (Signed)
NAMEMAVIE, HAFLER NO.:  [id number]  MEDICAL RECORD NO.:  [id number]  LOCATION:  25                         FACILITY:  [facility]  PHYSICIAN:  Alexis Frock, MD     DATE OF BIRTH:  30-May-1951  DATE OF PROCEDURE: 12/22/2015                               OPERATIVE REPORT  PREOPERATIVE DIAGNOSIS:  Solid enhancing left renal mass.  PROCEDURE:  Robotic-assisted laparoscopic left partial nephrectomy.  ESTIMATED BLOOD LOSS:  150 mL.  COMPLICATIONS:  None.  SPECIMEN: 1. Left partial nephrectomy. 2. Base of left deep margin.  ASSISTANT:  Debbrah Alar, PA.  DRAINS: 1. Jackson-Pratt drain bulb suction. 2. Foley catheter to straight drain.  FINDINGS: 1. Single artery, single vein, left renovascular anatomy as     anticipated. 2. Approximately 70% exophytic left solid renal mass.  INDICATIONS:  Ms. Kaman is a pleasant 65 year old lady who was found to have a left solid enhancing renal mass.  Options were discussed for management including ablative therapies versus surveillance protocol versus surgical extirpation with and without nephron sparing and with and without minimally invasive assistance.  She adamantly wished to proceed with a nephron sparing surgery and was referred for this. Reviewed the imaging, clinical history, and agreed this was reasonable means of management and she wished to proceed.  Informed consent was obtained and placed in medical record.  PROCEDURE IN DETAIL:  The patient was being Lollie Sails, verified.  Procedure being left partial nephrectomy was confirmed. Procedure carried out.  Time-out was performed.  Intravenous antibiotics were administered.  General endotracheal anesthesia introduced.  The patient placed into a left side up, full flank position, applying 15 degrees of table flexion, superior arm elevator, axillary roll, sequential compression devices, bottom leg bent, top leg straight.  She was further fashioned  on the operative table using 3-inch tape over foam padding across her supraxiphoid chest and her pelvis.  Sterile field was then created by prepping and draping the patient's entire left flank and abdomen using chlorhexidine gluconate.  Next, a high-flow low-pressure pneumoperitoneum was obtained using Veress technique in the left lower quadrant having passed the aspiration and drop test.  Next, an 8-mm robotic camera port was placed in position approximately 1-1/2 handbreadth superolateral to the umbilicus.  Laparoscopic examination of the peritoneal cavity revealed copious intra-abdominal fat but no significant adhesions and no visceral injury.  Additional ports were then placed as follows:  A left subcostal 8-mm robotic port and left far lateral 11-mm robotic port approximately 4 fingerbreadths superomedial to the anterosuperior iliac spine and a left paramedian inferior robotic port approximately 1-1/2 handbreadth superior to the edge of pubic ramus and two 12 mm assistant ports placed in paramedian location, one 3 fingerbreadths superior to the camera port and one 3 fingerbreadths inferior to the camera port.  The inferior one was an AirSeal type port. Robot was docked and passed through electronic checks.  Initial attention was directed at the development of left retroperitoneum. Incision was made lateral to the descending colon from the splenic flexure towards the area of the internal ring.  The colon was carefully swept medially.  The lateral splenic attachments were taken down allowing the spleen to rotate medially and superiorly away from the  anterior surface of Gerota's.  The tail of the pancreas was identified and also carefully swept away from the anterior surface of Gerota's fascia.  Lower pole of kidney was identified and placed in gentle lateral traction.  Dissection proceeded medial to this.  The gonadal vein and girdle were encountered, placed on gentle lateral  traction. Dissection proceeded within this triangle towards the renal hilum. Renal hilum consisted of single artery, single vein, left renovascular anatomy as anticipated.  Both were circumferentially mobilized and the artery was marked with a vessel loop.  Dissection then proceeded directly onto the anterolateral surface of the kidney.  The mass in question was easily identified with robotic vision as it was approximately 70% exophytic and solid.  Fat around this was carefully swept away allowing at least 1-2 cm rim of fat free parenchyma around the lateral aspect of the mass.  The area of presumed partial nephrectomy was scored.  A purposeful small bucket-handle of fat was left on top of the specimen and warm ischemia was started after mannitol was given ablating 2 laparoscopic bulldog clamps on the artery.  Partial nephrectomy was then performed using cold scissors keeping what appeared to be a rim of normal parenchyma with the partial nephrectomy specimen. There was no obvious collecting system entrance, and the renal sinus fat was not seen.  There were no obvious grossly positive margins.  A separate base specimen was set aside for permanent pathology, labeled as deep margin of left renal mass.  First layer renorrhaphy was then performed using a single layer running V-Loc suture Over-sewing several small venous sinuses.  Bulldog clamps were then released for total warm ischemia time of 11 minutes.  Next, a second running V-Loc suture was used 3-0 in type, over-sewing 2 of the small venous sinuses that were unmasked by unclamping, resulted in excellent hemostatic control of the partial nephrectomy bed.  A bolster was applied to this as were 3 parenchymal apposition sutures of the 0 Vicryl sandwiched between Hemolocks and Lapra-Ty's which resulted in excellent parenchymal apposition against the bolster.  Following these maneuvers, all sponge and needle counts were correct. Hemostasis  appeared excellent.  The retroperitoneum was re-established by a short segment of the running Vicryl bringing in Gerota's fascia back into lateral apposition over the parenchyma in partial nephrectomy area.  The partial nephrectomy specimen was placed in EndoCatch bag.  A closed suction drain was brought through the previous left lateral most robotic port site near the peritoneal cavity.  Robot was then undocked and the superior most 12-mm assistant port site was closed with fascia using laparoscopic vision and a Carter-Thomason suture passer.  Specimen was retrieved by extending the inferior most assistant port site and removing the partial nephrectomy specimen and setting it aside for pathology.  This site was closed with fascia using figure-of-eight PDS followed by reapproximation of Scarpa's with running Vicryl.  All incision sites were infiltrated with dilute Lipolized Marcaine and closed at the level of the skin using subcuticular Monocryl followed by Dermabond.  Procedure was then terminated.  The patient tolerated the procedure well.  There were no immediate periprocedural complications.  The patient was taken to postanesthesia care unit in stable condition.          ______________________________ Alexis Frock, MD     TM/MEDQ  D:  12/22/2015  T:  12/22/2015  Job:  [id number]

## 2015-12-24 MED ORDER — SENNOSIDES-DOCUSATE SODIUM 8.6-50 MG PO TABS: 1.0000 | ORAL_TABLET | Freq: Two times a day (BID) | ORAL | Status: DC

## 2015-12-24 NOTE — Discharge Summary (Signed)
Physician Discharge Summary  Patient ID: Debbie Velasquez MRN: [id number] DOB/AGE: 1951-07-31 65 y.o.  Admit date: 12/22/2015 Discharge date: 12/24/2015  Admission Diagnoses: Left Renal Mass  Discharge Diagnoses:  Active Problems:   Renal mass   Discharged Condition: good  Hospital Course:   1 - Left renal mass - s/p left robotic partial nephrectomy 12/22/15 for 3cm left mid posterior mass. Path pending. JP and foley removed 3/2 as JP output scant. By 3/3, the day of discharge, she is ambulatory, tollerating regualr diet, pain controlled on PO meds, and felt to be adequate for discharge.    Consults: None  Significant Diagnostic Studies: labs: Hgb >10, Cr <1.5, surgical pathology pending at discarge  Treatments: surgery: as per above  Discharge Exam: Blood pressure 133/63, pulse 66, temperature 98 F (36.7 C), temperature source Oral, resp. rate 20, height 5\' 4"  (1.626 m), weight 113.116 kg (249 lb 6 oz), SpO2 98 %. General appearance: alert, cooperative, appears stated age and at baseline Head: Normocephalic, without obvious abnormality, atraumatic Eyes: negative Nose: Nares normal. Septum midline. Mucosa normal. No drainage or sinus tenderness. Throat: lips, mucosa, and tongue normal; teeth and gums normal Neck: supple, symmetrical, trachea midline Back: symmetric, no curvature. ROM normal. No CVA tenderness. Resp: non-labored on room air Cardio: Nl rate GI: soft, non-tender; bowel sounds normal; no masses,  no organomegaly Extremities: extremities normal, atraumatic, no cyanosis or edema Pulses: 2+ and symmetric Skin: Skin color, texture, turgor normal. No rashes or lesions Lymph nodes: Cervical, supraclavicular, and axillary nodes normal. Neurologic: Grossly normal Incision/Wound: recent port sites / extraction sites c/d/i. Prior JP site c/d/i with dry dressing.   Disposition: 01-Home or Self Care     Medication List    STOP taking these medications        aspirin EC 81 MG tablet     ibuprofen 800 MG tablet  Commonly known as:  ADVIL,MOTRIN     meloxicam 7.5 MG tablet  Commonly known as:  MOBIC     tamsulosin 0.4 MG Caps capsule  Commonly known as:  FLOMAX      TAKE these medications        acetaminophen 500 MG tablet  Commonly known as:  TYLENOL  Take 1,000 mg by mouth every 6 (six) hours as needed for mild pain or headache.     alum hydroxide-mag trisilicate [id number] MG Chew chewable tablet  Commonly known as:  GAVISCON  Chew 2 tablets by mouth 2 (two) times daily as needed for indigestion or heartburn.     HYDROcodone-acetaminophen 5-325 MG tablet  Commonly known as:  NORCO/VICODIN  Take 1-2 tablets by mouth every 6 (six) hours as needed.     losartan-hydrochlorothiazide 100-12.5 MG tablet  Commonly known as:  HYZAAR  Take 1 tablet by mouth every morning.     metFORMIN 500 MG tablet  Commonly known as:  GLUCOPHAGE  Take 500 mg by mouth 2 (two) times daily with a meal.     senna-docusate 8.6-50 MG tablet  Commonly known as:  Senokot-S  Take 1 tablet by mouth 2 (two) times daily. While taking pain meds to prevent constipation           Follow-up Information    Follow up with Alexis Frock, MD On 01/06/2016.   Specialty:  Urology   Why:  at 11:30 AM for MD visit   Contact information:   [place] [address] [phone]       Signed: Alexis Frock 12/24/2015,  7:25 AM

## 2016-03-17 ENCOUNTER — Emergency Department (HOSPITAL_COMMUNITY): Payer: Medicare Other

## 2016-03-17 ENCOUNTER — Emergency Department (HOSPITAL_COMMUNITY)
Admission: EM | Admit: 2016-03-17 | Discharge: 2016-03-17 | Disposition: A | Discharge status: Home / Self Care | Payer: Medicare Other | Attending: Emergency Medicine | Admitting: Emergency Medicine

## 2016-03-17 ENCOUNTER — Encounter (HOSPITAL_COMMUNITY): Payer: Self-pay | Admitting: Emergency Medicine

## 2016-03-17 DIAGNOSIS — R109 Unspecified abdominal pain: Secondary | ICD-10-CM | POA: Diagnosis present

## 2016-03-17 DIAGNOSIS — I10 Essential (primary) hypertension: Secondary | ICD-10-CM | POA: Diagnosis not present

## 2016-03-17 DIAGNOSIS — Z7982 Long term (current) use of aspirin: Secondary | ICD-10-CM | POA: Diagnosis not present

## 2016-03-17 DIAGNOSIS — Z7984 Long term (current) use of oral hypoglycemic drugs: Secondary | ICD-10-CM | POA: Insufficient documentation

## 2016-03-17 DIAGNOSIS — E119 Type 2 diabetes mellitus without complications: Secondary | ICD-10-CM | POA: Diagnosis not present

## 2016-03-17 DIAGNOSIS — Z79899 Other long term (current) drug therapy: Secondary | ICD-10-CM | POA: Insufficient documentation

## 2016-03-17 DIAGNOSIS — N200 Calculus of kidney: Secondary | ICD-10-CM | POA: Insufficient documentation

## 2016-03-17 LAB — COMPREHENSIVE METABOLIC PANEL
ALBUMIN: 4.2 g/dL (ref 3.5–5.0)
ALT: 10 U/L — ABNORMAL LOW (ref 14–54)
ANION GAP: 6 (ref 5–15)
AST: 16 U/L (ref 15–41)
Alkaline Phosphatase: 65 U/L (ref 38–126)
BUN: 23 mg/dL — ABNORMAL HIGH (ref 6–20)
CHLORIDE: 108 mmol/L (ref 101–111)
CO2: 26 mmol/L (ref 22–32)
Calcium: 9 mg/dL (ref 8.9–10.3)
Creatinine, Ser: 1.05 mg/dL — ABNORMAL HIGH (ref 0.44–1.00)
GFR calc non Af Amer: 54 mL/min — ABNORMAL LOW (ref >60)
GLUCOSE: 107 mg/dL — AB (ref 65–99)
POTASSIUM: 3.5 mmol/L (ref 3.5–5.1)
SODIUM: 140 mmol/L (ref 135–145)
Total Bilirubin: 0.6 mg/dL (ref 0.3–1.2)
Total Protein: 7.7 g/dL (ref 6.5–8.1)

## 2016-03-17 LAB — LIPASE, BLOOD: LIPASE: 48 U/L (ref 11–51)

## 2016-03-17 LAB — URINALYSIS, ROUTINE W REFLEX MICROSCOPIC
Glucose, UA: NEGATIVE mg/dL
Ketones, ur: 15 mg/dL — AB
NITRITE: NEGATIVE
PROTEIN: 100 mg/dL — AB
SPECIFIC GRAVITY, URINE: 1.025 (ref 1.005–1.030)
pH: 5 (ref 5.0–8.0)

## 2016-03-17 LAB — CBC
HEMATOCRIT: 33.6 % — AB (ref 36.0–46.0)
HEMOGLOBIN: 10.5 g/dL — AB (ref 12.0–15.0)
MCH: 27.3 pg (ref 26.0–34.0)
MCHC: 31.3 g/dL (ref 30.0–36.0)
MCV: 87.5 fL (ref 78.0–100.0)
Platelets: 389 10*3/uL (ref 150–400)
RBC: 3.84 MIL/uL — AB (ref 3.87–5.11)
RDW: 13.7 % (ref 11.5–15.5)
WBC: 11.6 10*3/uL — ABNORMAL HIGH (ref 4.0–10.5)

## 2016-03-17 LAB — URINE MICROSCOPIC-ADD ON

## 2016-03-17 MED ORDER — KETOROLAC TROMETHAMINE 15 MG/ML IJ SOLN
7.5000 mg | Freq: Once | INTRAMUSCULAR | Status: AC
  Administered 2016-03-17: 7.5 mg via INTRAVENOUS
  Filled 2016-03-17: qty 1

## 2016-03-17 MED ORDER — DEXTROSE 5 % IV SOLN
1.0000 g | Freq: Once | INTRAVENOUS | Status: AC
  Administered 2016-03-17: 1 g via INTRAVENOUS
  Filled 2016-03-17: qty 10

## 2016-03-17 MED ORDER — HYDROCODONE-ACETAMINOPHEN 5-325 MG PO TABS: 1.0000 | ORAL_TABLET | Freq: Four times a day (QID) | ORAL | Status: DC | PRN

## 2016-03-17 NOTE — Discharge Instructions (Signed)
Kidney Stones °Kidney stones (urolithiasis) are deposits that form inside your kidneys. The intense pain is caused by the stone moving through the urinary tract. When the stone moves, the ureter goes into spasm around the stone. The stone is usually passed in the urine.  °CAUSES  °· A disorder that makes certain neck glands produce too much parathyroid hormone (primary hyperparathyroidism). °· A buildup of uric acid crystals, similar to gout in your joints. °· Narrowing (stricture) of the ureter. °· A kidney obstruction present at birth (congenital obstruction). °· Previous surgery on the kidney or ureters. °· Numerous kidney infections. °SYMPTOMS  °· Feeling sick to your stomach (nauseous). °· Throwing up (vomiting). °· Blood in the urine (hematuria). °· Pain that usually spreads (radiates) to the groin. °· Frequency or urgency of urination. °DIAGNOSIS  °· Taking a history and physical exam. °· Blood or urine tests. °· CT scan. °· Occasionally, an examination of the inside of the urinary bladder (cystoscopy) is performed. °TREATMENT  °· Observation. °· Increasing your fluid intake. °· Extracorporeal shock wave lithotripsy--This is a noninvasive procedure that uses shock waves to break up kidney stones. °· Surgery may be needed if you have severe pain or persistent obstruction. There are various surgical procedures. Most of the procedures are performed with the use of small instruments. Only small incisions are needed to accommodate these instruments, so recovery time is minimized. °The size, location, and chemical composition are all important variables that will determine the proper choice of action for you. Talk to your health care provider to better understand your situation so that you will minimize the risk of injury to yourself and your kidney.  °HOME CARE INSTRUCTIONS  °· Drink enough water and fluids to keep your urine clear or pale yellow. This will help you to pass the stone or stone fragments. °· Strain  all urine through the provided strainer. Keep all particulate matter and stones for your health care provider to see. The stone causing the pain may be as small as a grain of salt. It is very important to use the strainer each and every time you pass your urine. The collection of your stone will allow your health care provider to analyze it and verify that a stone has actually passed. The stone analysis will often identify what you can do to reduce the incidence of recurrences. °· Only take over-the-counter or prescription medicines for pain, discomfort, or fever as directed by your health care provider. °· Keep all follow-up visits as told by your health care provider. This is important. °· Get follow-up X-rays if required. The absence of pain does not always mean that the stone has passed. It may have only stopped moving. If the urine remains completely obstructed, it can cause loss of kidney function or even complete destruction of the kidney. It is your responsibility to make sure X-rays and follow-ups are completed. Ultrasounds of the kidney can show blockages and the status of the kidney. Ultrasounds are not associated with any radiation and can be performed easily in a matter of minutes. °· Make changes to your daily diet as told by your health care provider. You may be told to: °¨ Limit the amount of salt that you eat. °¨ Eat 5 or more servings of fruits and vegetables each day. °¨ Limit the amount of meat, poultry, fish, and eggs that you eat. °· Collect a 24-hour urine sample as told by your health care provider. You may need to collect another urine sample every 6-12   months. °SEEK MEDICAL CARE IF: °· You experience pain that is progressive and unresponsive to any pain medicine you have been prescribed. °SEEK IMMEDIATE MEDICAL CARE IF:  °· Pain cannot be controlled with the prescribed medicine. °· You have a fever or shaking chills. °· The severity or intensity of pain increases over 18 hours and is not  relieved by pain medicine. °· You develop a new onset of abdominal pain. °· You feel faint or pass out. °· You are unable to urinate. °  °This information is not intended to replace advice given to you by your health care provider. Make sure you discuss any questions you have with your health care provider. °  °Document Released: 10/09/2005 Document Revised: 06/30/2015 Document Reviewed: 03/12/2013 °Elsevier Interactive Patient Education ©2016 [employer] ° °

## 2016-03-17 NOTE — ED Provider Notes (Signed)
CSN: [id number]     Arrival date & time 03/17/16  1344 History   First MD Initiated Contact with Patient 03/17/16 1535     Chief Complaint  Patient presents with  . Flank Pain      HPI Patient presents with right-sided flank pain and nausea with one episode of vomiting.  Feels better after the vomiting.  Has had some dysuria and urgency.  No fever or chills.  Has history of kidney stones in the past. Past Medical History  Diagnosis Date  . Hypertension   . Type 2 diabetes mellitus ([place])   . Right ureteral stone   . Left renal mass   . Wears glasses   . Wears partial dentures     upper  . Arthritis   . Hx of seasonal allergies     rare flare ups  . History of kidney stones     x3 -remains with one on right.    Past Surgical History  Procedure Laterality Date  . Cesarean section  yrs ago  . Total abdominal hysterectomy w/ bilateral salpingoophorectomy  1980's  . Robotic assited partial nephrectomy Left 12/22/2015    Procedure: XI ROBOTIC ASSITED PARTIAL NEPHRECTOMY;  Surgeon: Alexis Frock, MD;  Location: [facility];  Service: Urology;  Laterality: Left;   Family History  Problem Relation Age of Onset  . Esophageal cancer Maternal Aunt   . Colon cancer Neg Hx   . Stomach cancer Neg Hx   . Rectal cancer Neg Hx    Social History  Substance Use Topics  . Smoking status: Never Smoker   . Smokeless tobacco: Never Used  . Alcohol Use: No   OB History    No data available     Review of Systems  Constitutional: Negative for fever and chills.  All other systems reviewed and are negative.     Allergies  Review of patient's allergies indicates no known allergies.  Home Medications   Prior to Admission medications   Medication Sig Start Date End Date Taking? Authorizing Provider  acetaminophen (TYLENOL) 500 MG tablet Take 1,000 mg by mouth every 6 (six) hours as needed for mild pain or headache.   Yes Historical Provider, MD  alum hydroxide-mag trisilicate (GAVISCON)  [id number] MG CHEW chewable tablet Chew 2 tablets by mouth 2 (two) times daily as needed for indigestion or heartburn.   Yes Historical Provider, MD  aspirin 81 MG chewable tablet Chew 81 mg by mouth daily.   Yes Historical Provider, MD  losartan-hydrochlorothiazide (HYZAAR) 100-12.5 MG tablet Take 1 tablet by mouth every morning.  08/23/15  Yes Historical Provider, MD  meloxicam (MOBIC) 15 MG tablet Take 15 mg by mouth daily. 02/24/16  Yes Historical Provider, MD  metFORMIN (GLUCOPHAGE) 500 MG tablet Take 500 mg by mouth 2 (two) times daily with a meal.   Yes Historical Provider, MD  HYDROcodone-acetaminophen (NORCO/VICODIN) 5-325 MG tablet Take 1-2 tablets by mouth every 6 (six) hours as needed. 03/17/16   Leonard Schwartz, MD  senna-docusate (SENOKOT-S) 8.6-50 MG tablet Take 1 tablet by mouth 2 (two) times daily. While taking pain meds to prevent constipation Patient not taking: Reported on 03/17/2016 12/24/15   Alexis Frock, MD   BP 159/75 mmHg  Pulse 76  Temp(Src) 98.1 F (36.7 C) (Oral)  Resp 16  SpO2 98% Physical Exam  Constitutional: She is oriented to person, place, and time. She appears well-developed and well-nourished. No distress.  HENT:  Head: Normocephalic and atraumatic.  Eyes: Pupils are equal,  round, and reactive to light.  Neck: Normal range of motion.  Cardiovascular: Normal rate and intact distal pulses.   Pulmonary/Chest: No respiratory distress.  Abdominal: Soft. Normal appearance. She exhibits no distension. There is no tenderness. There is no rebound.  Musculoskeletal: Normal range of motion.  Neurological: She is alert and oriented to person, place, and time. No cranial nerve deficit.  Skin: Skin is warm and dry. No rash noted.  Psychiatric: She has a normal mood and affect. Her behavior is normal.  Nursing note and vitals reviewed.   ED Course  Procedures (including critical care time) Medications  ketorolac (TORADOL) 15 MG/ML injection 7.5 mg (not administered)   cefTRIAXone (ROCEPHIN) 1 g in dextrose 5 % 50 mL IVPB (not administered)     Labs Review Labs Reviewed  COMPREHENSIVE METABOLIC PANEL - Abnormal; Notable for the following:    Glucose, Bld 107 (*)    BUN 23 (*)    Creatinine, Ser 1.05 (*)    ALT 10 (*)    GFR calc non Af Amer 54 (*)    All other components within normal limits  CBC - Abnormal; Notable for the following:    WBC 11.6 (*)    RBC 3.84 (*)    Hemoglobin 10.5 (*)    HCT 33.6 (*)    All other components within normal limits  URINALYSIS, ROUTINE W REFLEX MICROSCOPIC (NOT AT [facility]) - Abnormal; Notable for the following:    Color, Urine RED (*)    APPearance TURBID (*)    Hgb urine dipstick LARGE (*)    Bilirubin Urine SMALL (*)    Ketones, ur 15 (*)    Protein, ur 100 (*)    Leukocytes, UA SMALL (*)    All other components within normal limits  URINE MICROSCOPIC-ADD ON - Abnormal; Notable for the following:    Squamous Epithelial / LPF 0-5 (*)    Bacteria, UA RARE (*)    Crystals URIC ACID CRYSTALS (*)    All other components within normal limits  URINE CULTURE  LIPASE, BLOOD    Imaging Review Ct Renal Stone Study  03/17/2016  CLINICAL DATA:  Diagnosed with right-sided kidney stone. Woke this morning with right flank pain, nausea and feeling of abdominal bloating. EXAM: CT ABDOMEN AND PELVIS WITHOUT CONTRAST TECHNIQUE: Multidetector CT imaging of the abdomen and pelvis was performed following the standard protocol without IV contrast. COMPARISON:  None. FINDINGS: Lower chest: Bibasilar bronchiectasis, right greater than left. Mild atelectasis at the left lung base. Hepatobiliary: No mass visualized within the liver on this un-enhanced exam. Gallbladder is mildly distended but otherwise unremarkable. Pancreas: No mass or inflammatory process identified on this un-enhanced exam. Spleen: Within normal limits in size. Adrenals/Urinary Tract: 4 mm stone at the right UVJ causing moderate right-sided hydronephrosis.  Additional punctate stone within the lower pole of the right renal pelvis. Interval development of a mixed density irregular focus along the posterior cortical margin of the left kidney, difficult to definitively characterize without IV contrast, but concordant with patient's additional surgical history of recent partial nephrectomy in March of 2017. No left-sided renal stone or hydronephrosis. No left-sided ureteral stone. Bladder is decompressed. Stomach/Bowel: Bowel is normal in caliber. Scattered mild diverticulosis within the sigmoid colon but no inflammatory change to suggest acute diverticulitis. Appendix is not seen but there are no inflammatory changes about the cecum to suggest acute appendicitis. Stomach appears normal. Vascular/Lymphatic: No pathologically enlarged lymph nodes. No evidence of abdominal aortic aneurysm. Reproductive: Status  posthysterectomy. No mass or abnormal fluid seen within the midline pelvis or adjacent adnexal regions. Other: No free fluid or abscess collection. No free intraperitoneal air. 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 Additional milder degenerative changes noted within the thoracolumbar spine. No acute or suspicious osseous lesion. There is central diastases recti and a small periumbilical abdominal wall hernia which contains mesenteric fat only. Superficial soft tissues are otherwise unremarkable. IMPRESSION: 1. Obstructing 4 mm stone at the right UVJ causing moderate right-sided hydronephrosis. 2. Expected postsurgical changes along the posterior cortex of the left kidney, concordant with the surgical history of partial nephrectomy on 12/22/2015 3. Punctate right renal stone. 4. Mild colonic diverticulosis without evidence of acute diverticulitis. 5. 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 If this is not known, would consider nonemergent orthopedic consultation for further workup considerations. Electronically Signed   By: Franki Cabot M.D.   On: 03/17/2016 17:13   I have personally reviewed and evaluated these images and lab results as part of my medical decision-making.  Patient informed of CT findings.  She will follow-up with her primary care doctor concerning her hips.  She will follow with your urologist concerning the kidney stone.  MDM   Final diagnoses:  Kidney stone        Leonard Schwartz, MD 03/17/16 1730

## 2016-03-17 NOTE — ED Notes (Addendum)
Pt reports she has been diagnosed with a R side kidney stone. Woke up this am with R flank pain, nausea and feeling of abd bloating.

## 2016-03-19 LAB — URINE CULTURE
CULTURE: NO GROWTH
SPECIAL REQUESTS: NORMAL

## 2016-06-13 ENCOUNTER — Emergency Department (HOSPITAL_COMMUNITY)
Admission: EM | Admit: 2016-06-13 | Discharge: 2016-06-13 | Disposition: A | Discharge status: Home / Self Care | Payer: Medicare Other | Attending: Emergency Medicine | Admitting: Emergency Medicine

## 2016-06-13 ENCOUNTER — Encounter (HOSPITAL_COMMUNITY): Payer: Self-pay | Admitting: Emergency Medicine

## 2016-06-13 DIAGNOSIS — E119 Type 2 diabetes mellitus without complications: Secondary | ICD-10-CM | POA: Insufficient documentation

## 2016-06-13 DIAGNOSIS — R42 Dizziness and giddiness: Secondary | ICD-10-CM | POA: Diagnosis not present

## 2016-06-13 DIAGNOSIS — Z7984 Long term (current) use of oral hypoglycemic drugs: Secondary | ICD-10-CM | POA: Diagnosis not present

## 2016-06-13 DIAGNOSIS — Z7982 Long term (current) use of aspirin: Secondary | ICD-10-CM | POA: Diagnosis not present

## 2016-06-13 DIAGNOSIS — I10 Essential (primary) hypertension: Secondary | ICD-10-CM | POA: Diagnosis not present

## 2016-06-13 HISTORY — DX: Obesity, unspecified: E66.9

## 2016-06-13 LAB — CBC WITH DIFFERENTIAL/PLATELET
BASOS ABS: 0 10*3/uL (ref 0.0–0.1)
Basophils Relative: 0 %
EOS PCT: 4 %
Eosinophils Absolute: 0.3 10*3/uL (ref 0.0–0.7)
HEMATOCRIT: 34.7 % — AB (ref 36.0–46.0)
Hemoglobin: 10.5 g/dL — ABNORMAL LOW (ref 12.0–15.0)
LYMPHS ABS: 2.4 10*3/uL (ref 0.7–4.0)
LYMPHS PCT: 30 %
MCH: 27.2 pg (ref 26.0–34.0)
MCHC: 30.3 g/dL (ref 30.0–36.0)
MCV: 89.9 fL (ref 78.0–100.0)
MONO ABS: 0.3 10*3/uL (ref 0.1–1.0)
MONOS PCT: 4 %
NEUTROS ABS: 5.1 10*3/uL (ref 1.7–7.7)
Neutrophils Relative %: 62 %
PLATELETS: 362 10*3/uL (ref 150–400)
RBC: 3.86 MIL/uL — ABNORMAL LOW (ref 3.87–5.11)
RDW: 13.8 % (ref 11.5–15.5)
WBC: 8.2 10*3/uL (ref 4.0–10.5)

## 2016-06-13 LAB — BASIC METABOLIC PANEL
ANION GAP: 8 (ref 5–15)
BUN: 18 mg/dL (ref 6–20)
CHLORIDE: 104 mmol/L (ref 101–111)
CO2: 25 mmol/L (ref 22–32)
Calcium: 9.3 mg/dL (ref 8.9–10.3)
Creatinine, Ser: 0.98 mg/dL (ref 0.44–1.00)
GFR calc Af Amer: >60 mL/min (ref >60)
GFR, EST NON AFRICAN AMERICAN: 59 mL/min — AB (ref >60)
GLUCOSE: 134 mg/dL — AB (ref 65–99)
POTASSIUM: 4.1 mmol/L (ref 3.5–5.1)
Sodium: 137 mmol/L (ref 135–145)

## 2016-06-13 LAB — CBG MONITORING, ED: GLUCOSE-CAPILLARY: 146 mg/dL — AB (ref 65–99)

## 2016-06-13 NOTE — ED Notes (Signed)
Pt was sitting in the waiting room with her daughter when she felt dizzy and lightheaded. Pt had a snack and reports feeling better. While pt was on the way home, she felt another episode of dizziness, which has since resolved. Has hx of a panic attack many years ago and reports this feels similar. Pt denies hx of stroke, no neuro symptoms on assessment.

## 2016-06-13 NOTE — ED Provider Notes (Signed)
[facility] DEPT Provider Note   CSN: [id number] Arrival date & time: 06/13/16  0430     History   Chief Complaint Chief Complaint  Patient presents with  . Dizziness    HPI Debbie Velasquez is a 65 y.o. female.  HPI Patient was accompanying her daughter to the emergency department for her daughter to be seen. While in the waiting room the patient had an episode of feeling "jittery". In conjunction with that she felt a little lightheaded. She thought maybe her sugar had gotten low so  her daughter got her something to drink. Her blood sugar was checked after that and found to be within normal limits. Symptoms seem to resolve. She later was going to her car to leave and was standing her car she got the same feeling again. It resolved again. Each one lasted less than a minute. There were no other associated symptoms. Past Medical History:  Diagnosis Date  . Arthritis   . History of kidney stones    x3 -remains with one on right.   Marland Kitchen Hx of seasonal allergies    rare flare ups  . Hypertension   . Left renal mass   . Obesity   . Right ureteral stone   . Type 2 diabetes mellitus ([place])   . Wears glasses   . Wears partial dentures    upper    Patient Active Problem List   Diagnosis Date Noted  . Renal mass 12/22/2015    Past Surgical History:  Procedure Laterality Date  . CESAREAN SECTION  yrs ago  . ROBOTIC ASSITED PARTIAL NEPHRECTOMY Left 12/22/2015   Procedure: XI ROBOTIC ASSITED PARTIAL NEPHRECTOMY;  Surgeon: Alexis Frock, MD;  Location: [facility];  Service: Urology;  Laterality: Left;  . TOTAL ABDOMINAL HYSTERECTOMY W/ BILATERAL SALPINGOOPHORECTOMY  1980's    OB History    No data available       Home Medications    Prior to Admission medications   Medication Sig Start Date End Date Taking? Authorizing Provider  acetaminophen (TYLENOL) 500 MG tablet Take 1,000 mg by mouth every 6 (six) hours as needed for mild pain or headache.   Yes Historical Provider,  MD  alum hydroxide-mag trisilicate (GAVISCON) [id number] MG CHEW chewable tablet Chew 2 tablets by mouth 2 (two) times daily as needed for indigestion or heartburn.   Yes Historical Provider, MD  aspirin 81 MG chewable tablet Chew 81 mg by mouth daily.   Yes Historical Provider, MD  losartan-hydrochlorothiazide (HYZAAR) 100-12.5 MG tablet Take 1 tablet by mouth every morning.  08/23/15  Yes Historical Provider, MD  meloxicam (MOBIC) 15 MG tablet Take 15 mg by mouth daily. 02/24/16  Yes Historical Provider, MD  metFORMIN (GLUCOPHAGE) 500 MG tablet Take 500 mg by mouth 2 (two) times daily with a meal.   Yes Historical Provider, MD    Family History Family History  Problem Relation Age of Onset  . Esophageal cancer Maternal Aunt   . Colon cancer Neg Hx   . Stomach cancer Neg Hx   . Rectal cancer Neg Hx     Social History Social History  Substance Use Topics  . Smoking status: Never Smoker  . Smokeless tobacco: Never Used  . Alcohol use No     Allergies   Review of patient's allergies indicates no known allergies.   Review of Systems Review of Systems 10 Systems reviewed and are negative for acute change except as noted in the HPI.   Physical Exam Updated Vital  Signs BP 154/77 (BP Location: Right Arm)   Pulse 64   Temp 98.5 F (36.9 C) (Oral)   Resp 15   Ht 5\' 4"  (1.626 m)   Wt 248 lb (112.5 kg)   SpO2 100%   BMI 42.57 kg/m   Physical Exam  Constitutional: She is oriented to person, place, and time. She appears well-developed and well-nourished. No distress.  HENT:  Head: Normocephalic and atraumatic.  Nose: Nose normal.  Mouth/Throat: Oropharynx is clear and moist.  Eyes: Conjunctivae and EOM are normal. Pupils are equal, round, and reactive to light.  Neck: Neck supple.  Cardiovascular: Normal rate and regular rhythm.   No murmur heard. Pulmonary/Chest: Effort normal and breath sounds normal. No respiratory distress.  Abdominal: Soft. There is no tenderness.    Musculoskeletal: She exhibits no edema.  Neurological: She is alert and oriented to person, place, and time. No cranial nerve deficit. She exhibits normal muscle tone. Coordination normal.  Skin: Skin is warm and dry.  Psychiatric: She has a normal mood and affect.  Nursing note and vitals reviewed.    ED Treatments / Results  Labs (all labs ordered are listed, but only abnormal results are displayed) Labs Reviewed  BASIC METABOLIC PANEL - Abnormal; Notable for the following:       Result Value   Glucose, Bld 134 (*)    GFR calc non Af Amer 59 (*)    All other components within normal limits  CBC WITH DIFFERENTIAL/PLATELET - Abnormal; Notable for the following:    RBC 3.86 (*)    Hemoglobin 10.5 (*)    HCT 34.7 (*)    All other components within normal limits  CBG MONITORING, ED - Abnormal; Notable for the following:    Glucose-Capillary 146 (*)    All other components within normal limits  URINALYSIS, ROUTINE W REFLEX MICROSCOPIC (NOT AT [facility])    EKG  EKG Interpretation  Date/Time:  Tuesday June 13 2016 04:50:00 EDT Ventricular Rate:  78 PR Interval:  186 QRS Duration: 80 QT Interval:  366 QTC Calculation: 417 R Axis:   35 Text Interpretation:  Normal sinus rhythm Normal ECG No significant change since last tracing Confirmed by WARD,  DO, KRISTEN [id number]) on 06/13/2016 6:27:06 AM       Radiology No results found.  Procedures Procedures (including critical care time)  Medications Ordered in ED Medications - No data to display   Initial Impression / Assessment and Plan / ED Course  I have reviewed the triage vital signs and the nursing notes.  Pertinent labs & imaging results that were available during my care of the patient were reviewed by me and considered in my medical decision making (see chart for details).  Clinical Course     Final Clinical Impressions(s) / ED Diagnoses   Final diagnoses:  Dizziness  Vital signs stable. Labs within normal  limits from baseline. Patient has no positive findings of physical exam. At this time, brief lightheadedness with jitteriness does not appear to be secondary to acute emergent condition. Patient's counseled on signs and symptoms for a return and follow-up with her PCP.  New Prescriptions New Prescriptions   No medications on file     Charlesetta Shanks, MD 06/13/16 [phone]

## 2016-06-13 NOTE — ED Notes (Signed)
Pt denied any feelings of weakness, dizziness, and lightheadedness while standing

## 2016-06-13 NOTE — ED Triage Notes (Signed)
Pt. reports dizziness / lightheaded while sitting at waiting area this morning , alert and oriented , speech clear / no facial asymmetry , equal grips / ambulatory .

## 2016-06-26 ENCOUNTER — Encounter (HOSPITAL_COMMUNITY): Payer: Self-pay | Admitting: Emergency Medicine

## 2016-06-26 ENCOUNTER — Emergency Department (HOSPITAL_COMMUNITY)
Admission: EM | Admit: 2016-06-26 | Discharge: 2016-06-26 | Disposition: A | Discharge status: Home / Self Care | Payer: Medicare Other | Attending: Emergency Medicine | Admitting: Emergency Medicine

## 2016-06-26 DIAGNOSIS — I10 Essential (primary) hypertension: Secondary | ICD-10-CM | POA: Insufficient documentation

## 2016-06-26 DIAGNOSIS — Z7984 Long term (current) use of oral hypoglycemic drugs: Secondary | ICD-10-CM | POA: Insufficient documentation

## 2016-06-26 DIAGNOSIS — M25561 Pain in right knee: Secondary | ICD-10-CM | POA: Diagnosis not present

## 2016-06-26 DIAGNOSIS — E119 Type 2 diabetes mellitus without complications: Secondary | ICD-10-CM | POA: Insufficient documentation

## 2016-06-26 DIAGNOSIS — Z7982 Long term (current) use of aspirin: Secondary | ICD-10-CM | POA: Diagnosis not present

## 2016-06-26 DIAGNOSIS — Z79899 Other long term (current) drug therapy: Secondary | ICD-10-CM | POA: Insufficient documentation

## 2016-06-26 DIAGNOSIS — M25569 Pain in unspecified knee: Secondary | ICD-10-CM

## 2016-06-26 DIAGNOSIS — M25562 Pain in left knee: Secondary | ICD-10-CM | POA: Diagnosis not present

## 2016-06-26 MED ORDER — KETOROLAC TROMETHAMINE 60 MG/2ML IM SOLN
60.0000 mg | Freq: Once | INTRAMUSCULAR | Status: AC
  Administered 2016-06-26: 60 mg via INTRAMUSCULAR
  Filled 2016-06-26: qty 2

## 2016-06-26 MED ORDER — PREDNISONE 20 MG PO TABS
60.0000 mg | ORAL_TABLET | Freq: Once | ORAL | Status: AC
  Administered 2016-06-26: 60 mg via ORAL
  Filled 2016-06-26: qty 3

## 2016-06-26 MED ORDER — DICLOFENAC SODIUM ER 100 MG PO TB24: 100.0000 mg | ORAL_TABLET | Freq: Every day | ORAL | 0 refills | Status: DC

## 2016-06-26 NOTE — ED Provider Notes (Signed)
[facility] DEPT Provider Note   CSN: [id number] Arrival date & time: 06/26/16  0135     History   Chief Complaint Chief Complaint  Patient presents with  . Knee Pain    HPI Debbie Velasquez is a 65 y.o. female.  The history is provided by the patient.  Knee Pain   This is a chronic problem. The current episode started more than 1 week ago. The problem occurs constantly. The problem has been gradually worsening. The pain is present in the left knee and right knee. The quality of the pain is described as aching. The pain is moderate. Pertinent negatives include no numbness and full range of motion. The symptoms are aggravated by activity. She has tried nothing for the symptoms. The treatment provided no relief. There has been no history of extremity trauma. Family history is significant for no rheumatoid arthritis.  Was told she needs B knee replacement and was getting injections by Dr. Ninfa Linden but has been too busy to go back.  Came in tonight because pain was worse.    Past Medical History:  Diagnosis Date  . Arthritis   . History of kidney stones    x3 -remains with one on right.   Marland Kitchen Hx of seasonal allergies    rare flare ups  . Hypertension   . Left renal mass   . Obesity   . Right ureteral stone   . Type 2 diabetes mellitus ([place])   . Wears glasses   . Wears partial dentures    upper    Patient Active Problem List   Diagnosis Date Noted  . Renal mass 12/22/2015    Past Surgical History:  Procedure Laterality Date  . CESAREAN SECTION  yrs ago  . ROBOTIC ASSITED PARTIAL NEPHRECTOMY Left 12/22/2015   Procedure: XI ROBOTIC ASSITED PARTIAL NEPHRECTOMY;  Surgeon: Alexis Frock, MD;  Location: [facility];  Service: Urology;  Laterality: Left;  . TOTAL ABDOMINAL HYSTERECTOMY W/ BILATERAL SALPINGOOPHORECTOMY  1980's    OB History    No data available       Home Medications    Prior to Admission medications   Medication Sig Start Date End Date Taking?  Authorizing Provider  acetaminophen (TYLENOL) 500 MG tablet Take 1,000 mg by mouth every 6 (six) hours as needed for mild pain or headache.    Historical Provider, MD  alum hydroxide-mag trisilicate (GAVISCON) [id number] MG CHEW chewable tablet Chew 2 tablets by mouth 2 (two) times daily as needed for indigestion or heartburn.    Historical Provider, MD  aspirin 81 MG chewable tablet Chew 81 mg by mouth daily.    Historical Provider, MD  losartan-hydrochlorothiazide (HYZAAR) 100-12.5 MG tablet Take 1 tablet by mouth every morning.  08/23/15   Historical Provider, MD  meloxicam (MOBIC) 15 MG tablet Take 15 mg by mouth daily. 02/24/16   Historical Provider, MD  metFORMIN (GLUCOPHAGE) 500 MG tablet Take 500 mg by mouth 2 (two) times daily with a meal.    Historical Provider, MD    Family History Family History  Problem Relation Age of Onset  . Esophageal cancer Maternal Aunt   . Colon cancer Neg Hx   . Stomach cancer Neg Hx   . Rectal cancer Neg Hx     Social History Social History  Substance Use Topics  . Smoking status: Never Smoker  . Smokeless tobacco: Never Used  . Alcohol use No     Allergies   Review of patient's allergies indicates no known  allergies.   Review of Systems Review of Systems  Musculoskeletal: Positive for arthralgias.  Neurological: Negative for numbness.  All other systems reviewed and are negative.    Physical Exam Updated Vital Signs BP 165/89 (BP Location: Left Arm)   Pulse 71   Temp 97.7 F (36.5 C) (Oral)   Resp 18   SpO2 99%   Physical Exam  Constitutional: She is oriented to person, place, and time. She appears well-developed and well-nourished. No distress.  HENT:  Head: Normocephalic and atraumatic.  Eyes: EOM are normal. Pupils are equal, round, and reactive to light.  Neck: Normal range of motion. Neck supple.  Cardiovascular: Normal rate, regular rhythm and intact distal pulses.   Pulmonary/Chest: Effort normal and breath sounds normal.  She has no wheezes.  Abdominal: Soft. Bowel sounds are normal. She exhibits no mass. There is no tenderness. There is no guarding.  Musculoskeletal: Normal range of motion.       Right knee: Normal. She exhibits normal range of motion, no swelling, no effusion, no ecchymosis, no deformity, no laceration, no erythema, normal alignment, no LCL laxity, normal patellar mobility, no bony tenderness, normal meniscus and no MCL laxity. No tenderness found. No medial joint line, no lateral joint line, no MCL, no LCL and no patellar tendon tenderness noted.       Left knee: Normal. She exhibits normal range of motion, no swelling, no effusion, no ecchymosis, no deformity, no laceration, no erythema, normal alignment, no LCL laxity, normal patellar mobility, no bony tenderness, normal meniscus and no MCL laxity. No tenderness found. No medial joint line, no lateral joint line, no MCL, no LCL and no patellar tendon tenderness noted.  Neurological: She is alert and oriented to person, place, and time. She has normal reflexes.  Skin: Skin is warm and dry. Capillary refill takes less than 2 seconds.     ED Treatments / Results   Vitals:   06/26/16 0140  BP: 165/89  Pulse: 71  Resp: 18  Temp: 97.7 F (36.5 C)    EKG  EKG Interpretation None       Radiology No results found.  Procedures Procedures (including critical care time)  Medications Ordered in ED Medications  ketorolac (TORADOL) injection 60 mg (not administered)  predniSONE (DELTASONE) tablet 60 mg (not administered)     Initial Impression / Assessment and Plan / ED Course  I have reviewed the triage vital signs and the nursing notes.  Pertinent labs & imaging results that were available during my care of the patient were reviewed by me and considered in my medical decision making (see chart for details).  Clinical Course   Vitals:   06/26/16 0140  BP: 165/89  Pulse: 71  Resp: 18  Temp: 97.7 F (36.5 C)    I do not  believe that patient requires films tonight as these issues are chronic and patient has had not new trauma.  Will treat pain and have patient follow up with Dr. Ninfa Linden for ongoing care.    Medications  ketorolac (TORADOL) injection 60 mg (60 mg Intramuscular Given 06/26/16 0158)  predniSONE (DELTASONE) tablet 60 mg (60 mg Oral Given 06/26/16 0157)    All questions answered to patient's satisfaction. Based on history and exam patient has been appropriately medically screened and emergency conditions excluded. Patient is stable for discharge at this time. Follow up with your PMD for recheck in 2 days and strict return precautions given.    Final Clinical Impressions(s) / ED Diagnoses  Final diagnoses:  None    New Prescriptions New Prescriptions   No medications on file     Annamae Shivley, MD 06/26/16 0202

## 2016-06-26 NOTE — ED Triage Notes (Signed)
Pt states that she knows she needs knee replacement surgery but both of her knees have been bothering her so bad tonight that she couldn't sleep. Alert and oriented.

## 2016-07-06 ENCOUNTER — Ambulatory Visit (HOSPITAL_COMMUNITY)
Admission: RE | Admit: 2016-07-06 | Discharge: 2016-07-06 | Disposition: A | Discharge status: Home / Self Care | Payer: Medicare Other | Source: Ambulatory Visit | Attending: Urology | Admitting: Urology

## 2016-07-06 ENCOUNTER — Other Ambulatory Visit: Payer: Self-pay | Admitting: Urology

## 2016-07-06 DIAGNOSIS — C642 Malignant neoplasm of left kidney, except renal pelvis: Secondary | ICD-10-CM

## 2016-07-13 ENCOUNTER — Other Ambulatory Visit: Payer: Self-pay | Admitting: Urology

## 2016-07-13 DIAGNOSIS — C642 Malignant neoplasm of left kidney, except renal pelvis: Secondary | ICD-10-CM

## 2016-07-13 DIAGNOSIS — C641 Malignant neoplasm of right kidney, except renal pelvis: Secondary | ICD-10-CM

## 2016-09-08 ENCOUNTER — Ambulatory Visit (HOSPITAL_COMMUNITY)
Admission: RE | Admit: 2016-09-08 | Discharge: 2016-09-08 | Disposition: A | Discharge status: Home / Self Care | Payer: Medicare Other | Source: Ambulatory Visit | Attending: Urology | Admitting: Urology

## 2016-09-08 DIAGNOSIS — D1803 Hemangioma of intra-abdominal structures: Secondary | ICD-10-CM | POA: Insufficient documentation

## 2016-09-08 DIAGNOSIS — C642 Malignant neoplasm of left kidney, except renal pelvis: Secondary | ICD-10-CM | POA: Diagnosis present

## 2016-09-08 DIAGNOSIS — C641 Malignant neoplasm of right kidney, except renal pelvis: Secondary | ICD-10-CM | POA: Diagnosis present

## 2016-09-08 LAB — POCT I-STAT CREATININE: CREATININE: 1 mg/dL (ref 0.44–1.00)

## 2016-09-08 MED ORDER — GADOBENATE DIMEGLUMINE 529 MG/ML IV SOLN
20.0000 mL | Freq: Once | INTRAVENOUS | Status: AC | PRN
  Administered 2016-09-08: 20 mL via INTRAVENOUS

## 2016-12-10 ENCOUNTER — Encounter (HOSPITAL_BASED_OUTPATIENT_CLINIC_OR_DEPARTMENT_OTHER): Payer: Self-pay | Admitting: Emergency Medicine

## 2016-12-10 ENCOUNTER — Emergency Department (HOSPITAL_BASED_OUTPATIENT_CLINIC_OR_DEPARTMENT_OTHER)
Admission: EM | Admit: 2016-12-10 | Discharge: 2016-12-11 | Disposition: A | Discharge status: Home / Self Care | Payer: Medicare Other | Attending: Emergency Medicine | Admitting: Emergency Medicine

## 2016-12-10 DIAGNOSIS — Z79899 Other long term (current) drug therapy: Secondary | ICD-10-CM | POA: Diagnosis not present

## 2016-12-10 DIAGNOSIS — Y999 Unspecified external cause status: Secondary | ICD-10-CM | POA: Insufficient documentation

## 2016-12-10 DIAGNOSIS — S46912A Strain of unspecified muscle, fascia and tendon at shoulder and upper arm level, left arm, initial encounter: Secondary | ICD-10-CM | POA: Insufficient documentation

## 2016-12-10 DIAGNOSIS — Z7984 Long term (current) use of oral hypoglycemic drugs: Secondary | ICD-10-CM | POA: Insufficient documentation

## 2016-12-10 DIAGNOSIS — T148XXA Other injury of unspecified body region, initial encounter: Secondary | ICD-10-CM

## 2016-12-10 DIAGNOSIS — Y939 Activity, unspecified: Secondary | ICD-10-CM | POA: Insufficient documentation

## 2016-12-10 DIAGNOSIS — I10 Essential (primary) hypertension: Secondary | ICD-10-CM | POA: Diagnosis not present

## 2016-12-10 DIAGNOSIS — E119 Type 2 diabetes mellitus without complications: Secondary | ICD-10-CM | POA: Insufficient documentation

## 2016-12-10 DIAGNOSIS — S4992XA Unspecified injury of left shoulder and upper arm, initial encounter: Secondary | ICD-10-CM | POA: Diagnosis present

## 2016-12-10 DIAGNOSIS — X509XXA Other and unspecified overexertion or strenuous movements or postures, initial encounter: Secondary | ICD-10-CM | POA: Diagnosis not present

## 2016-12-10 DIAGNOSIS — Y929 Unspecified place or not applicable: Secondary | ICD-10-CM | POA: Diagnosis not present

## 2016-12-10 NOTE — ED Triage Notes (Addendum)
Patient states that she is having left upper arm to shoulder pain started today when she was pulling herself up off the cough  - denies any chest pain or SOB - patient reports that the pain is worsening with movement of her left arm, however reports that she has some noted dizziness and lightheaded feelings with the pain. Reports that the patient is sharp shooting spasm type pain

## 2016-12-10 NOTE — ED Provider Notes (Signed)
[facility] DEPT [facility] Provider Note   CSN: [id number] Arrival date & time: 12/10/16  2227  By signing my name below, I, Reola Mosher, attest that this documentation has been prepared under the direction and in the presence of Gali Spinney, [place].  Electronically Signed: Reola Mosher, ED Scribe. 12/10/16. 11:20 PM.  History   Chief Complaint Chief Complaint  Patient presents with  . Arm Pain   The history is provided by the patient. No language interpreter was used.    HPI Comments: Debbie Velasquez is a 66 y.o. female with a h/o DM, arthritis, HTN, and obesity, who presents to the Emergency Department complaining of unchanged, intermittent left upper arm pain beginning this afternoon. She rates her current pain as 4/10. Pt reports that she was getting up from her couch this afternoon, bearing her weight into the arm, and her pain has been present since. Pt describes her pain as sore-like. Pt has been applying heat over the area with some improvement of her pain. Her pain is alleviated with rest of the arm and exacerbated with movement of the arm. She denies fever, chills, chest pain, shortness of breath, weakness, numbness, joint swelling, redness, or any other associated symptoms.   Past Medical History:  Diagnosis Date  . Arthritis   . History of kidney stones    x3 -remains with one on right.   Marland Kitchen Hx of seasonal allergies    rare flare ups  . Hypertension   . Left renal mass   . Obesity   . Right ureteral stone   . Type 2 diabetes mellitus ([place])   . Wears glasses   . Wears partial dentures    upper   Patient Active Problem List   Diagnosis Date Noted  . Renal mass 12/22/2015   Past Surgical History:  Procedure Laterality Date  . CESAREAN SECTION  yrs ago  . ROBOTIC ASSITED PARTIAL NEPHRECTOMY Left 12/22/2015   Procedure: XI ROBOTIC ASSITED PARTIAL NEPHRECTOMY;  Surgeon: Alexis Frock, MD;  Location: [facility];  Service: Urology;  Laterality: Left;    . TOTAL ABDOMINAL HYSTERECTOMY W/ BILATERAL SALPINGOOPHORECTOMY  1980's   OB History    No data available     Home Medications    Prior to Admission medications   Medication Sig Start Date End Date Taking? Authorizing Provider  acetaminophen (TYLENOL) 500 MG tablet Take 1,000 mg by mouth every 6 (six) hours as needed for mild pain or headache.    Historical Provider, MD  alum hydroxide-mag trisilicate (GAVISCON) [id number] MG CHEW chewable tablet Chew 2 tablets by mouth 2 (two) times daily as needed for indigestion or heartburn.    Historical Provider, MD  aspirin 81 MG chewable tablet Chew 81 mg by mouth daily.    Historical Provider, MD  Diclofenac Sodium CR (VOLTAREN-XR) 100 MG 24 hr tablet Take 1 tablet (100 mg total) by mouth daily. 06/26/16   April Palumbo, MD  losartan-hydrochlorothiazide (HYZAAR) 100-12.5 MG tablet Take 1 tablet by mouth every morning.  08/23/15   Historical Provider, MD  meloxicam (MOBIC) 15 MG tablet Take 15 mg by mouth daily. 02/24/16   Historical Provider, MD  metFORMIN (GLUCOPHAGE) 500 MG tablet Take 500 mg by mouth 2 (two) times daily with a meal.    Historical Provider, MD   Family History Family History  Problem Relation Age of Onset  . Esophageal cancer Maternal Aunt   . Colon cancer Neg Hx   . Stomach cancer Neg Hx   .  Rectal cancer Neg Hx    Social History Social History  Substance Use Topics  . Smoking status: Never Smoker  . Smokeless tobacco: Never Used  . Alcohol use No   Allergies   Patient has no known allergies.  Review of Systems Review of Systems  Constitutional: Negative for chills and fever.  Respiratory: Negative for shortness of breath.   Cardiovascular: Negative for chest pain.  Musculoskeletal: Positive for myalgias. Negative for joint swelling.  Skin: Negative for color change.   Physical Exam Updated Vital Signs BP 165/84   Pulse 73   Temp 98.1 F (36.7 C) (Oral)   Resp 16   Ht 5\' 4"  (1.626 m)   Wt 113.4 kg   SpO2 100%    BMI 42.91 kg/m   Physical Exam  Constitutional: She appears well-developed and well-nourished. No distress.  HENT:  Head: Normocephalic and atraumatic.  Eyes: Conjunctivae are normal.  Neck: Normal range of motion.  Cardiovascular: Normal rate and intact distal pulses.   Pulmonary/Chest: Effort normal.  Abdominal: She exhibits no distension.  Musculoskeletal: Normal range of motion.  Pt with full active ROM of the left arm. There is some muscular tenderness to the insertion point of the medial deltoid. No erythema, edema, or swelling over the left upper arm. Negative Neer's test. Negative Hawkins-Kennedy test. Negative empty can test. Negative crossover test.   Neurological: She is alert.  Strength is 5/5 to bilateral upper extremities. Normal sensation throughout.   Skin: No pallor.  Psychiatric: She has a normal mood and affect. Her behavior is normal.  Nursing note and vitals reviewed.  ED Treatments / Results  DIAGNOSTIC STUDIES: Oxygen Saturation is 100% on RA, normal by my interpretation.   COORDINATION OF CARE: 11:19 PM-Discussed next steps with pt. Pt verbalized understanding and is agreeable with the plan.   Labs (all labs ordered are listed, but only abnormal results are displayed) Labs Reviewed  CBC WITH DIFFERENTIAL/PLATELET - Abnormal; Notable for the following:       Result Value   RBC 3.72 (*)    Hemoglobin 10.3 (*)    HCT 33.4 (*)    All other components within normal limits  BASIC METABOLIC PANEL - Abnormal; Notable for the following:    Glucose, Bld 121 (*)    BUN 28 (*)    Creatinine, Ser 1.22 (*)    GFR calc non Af Amer 45 (*)    GFR calc Af Amer 52 (*)    All other components within normal limits  TROPONIN I    EKG  EKG Interpretation  Date/Time:  Sunday December 10 2016 22:38:53 EST Ventricular Rate:  80 PR Interval:  196 QRS Duration: 76 QT Interval:  372 QTC Calculation: 429 R Axis:   32 Text Interpretation:  Normal sinus rhythm  Cannot rule out Anterior infarct , age undetermined Abnormal ECG No significant change was found Confirmed by Wyvonnia Dusky  MD, STEPHEN [phone]) on 12/10/2016 10:59:52 PM      Radiology No results found.  Procedures Procedures   Medications Ordered in ED Medications - No data to display  Initial Impression / Assessment and Plan / ED Course  I have reviewed the triage vital signs and the nursing notes.  Pertinent labs & imaging results that were available during my care of the patient were reviewed by me and considered in my medical decision making (see chart for details).    Patient with likely muscle skeletal pain. On exam patient is afebrile, no apparent distress vital  signs are stable. Heart and lung sounds are clear. She has pain to her left upper arm near insertion of medial deltoid. It is tender to palpation. There is no visible swelling, redness, deformity, wound present. Patient has FROM of the left shoulder. Sensation is intact upper extremities bilaterally. Muscle strength 5/5 to upper extremities bilaterally. Patient has no chest pain or shortness of breath. Her presentation is that likely of cardiac or pulmonary etiology d/t presentation, VSS, no tracheal deviation, no JVD or new murmur, RRR, breath sounds equal bilaterally, EKG without acute abnormalities, negative troponin, and negative CXR. Heart Score 3.  At shift change care was transferred to Dr. Wyvonnia Dusky who will follow pending studies, re-evaulate and determine disposition.     Final Clinical Impressions(s) / ED Diagnoses   Final diagnoses:  None   New Prescriptions New Prescriptions   No medications on file   I personally performed the services described in this documentation, which was scribed in my presence. The recorded information has been reviewed and is accurate.    [place], [place] 12/11/16 0100    Ezequiel Essex, MD 12/11/16 [phone]

## 2016-12-10 NOTE — ED Provider Notes (Signed)
By signing my name below, I, Soijett Blue, attest that this documentation has been prepared under the direction and in the presence of Ezequiel Essex, MD. Electronically Signed: Soijett Blue, ED Scribe. 12/10/16. 11:43 PM.  HPI: Debbie Velasquez is a 66 y.o. female with a PMHx of DM, HTN, who presents to the Emergency Department complaining of left arm pain onset this afternoon. She hasn't tried any medications for the relief of her symptoms. Pt states that she typically takes meloxicam PRN, but didn't take any today. Pt notes that she was attempting to get off her couch, by pushing herself up when the left arm pain occurred. She denies CP, SOB, numbness, weakness, neck pain, back pain, left shoulder pain, and any other symptoms. Denies hx of cardiac issues.    PE: Tenderness to left tricep. FROM of left shoulder and elbow. Intact radial pulses and cardinal hand movements.  Arm pain appears musculoskeletal. Neurovascularly intact.  Doubt cardiac etiology.  EKG nsr.  Will check screening labs.   I personally performed the services described in this documentation, which was scribed in my presence. The recorded information has been reviewed and is accurate.     Ezequiel Essex, MD 12/11/16 [phone]

## 2016-12-11 ENCOUNTER — Ambulatory Visit (INDEPENDENT_AMBULATORY_CARE_PROVIDER_SITE_OTHER): Payer: Medicare Other | Admitting: Orthopaedic Surgery

## 2016-12-11 ENCOUNTER — Ambulatory Visit (INDEPENDENT_AMBULATORY_CARE_PROVIDER_SITE_OTHER): Payer: Medicare Other

## 2016-12-11 VITALS — Ht 64.0 in | Wt 250.0 lb

## 2016-12-11 DIAGNOSIS — G8929 Other chronic pain: Secondary | ICD-10-CM

## 2016-12-11 DIAGNOSIS — M25551 Pain in right hip: Secondary | ICD-10-CM

## 2016-12-11 DIAGNOSIS — M25562 Pain in left knee: Secondary | ICD-10-CM

## 2016-12-11 DIAGNOSIS — M1712 Unilateral primary osteoarthritis, left knee: Secondary | ICD-10-CM

## 2016-12-11 DIAGNOSIS — M1711 Unilateral primary osteoarthritis, right knee: Secondary | ICD-10-CM | POA: Diagnosis not present

## 2016-12-11 DIAGNOSIS — M25561 Pain in right knee: Secondary | ICD-10-CM

## 2016-12-11 LAB — BASIC METABOLIC PANEL
Anion gap: 8 (ref 5–15)
BUN: 28 mg/dL — ABNORMAL HIGH (ref 6–20)
CHLORIDE: 103 mmol/L (ref 101–111)
CO2: 25 mmol/L (ref 22–32)
CREATININE: 1.22 mg/dL — AB (ref 0.44–1.00)
Calcium: 8.9 mg/dL (ref 8.9–10.3)
GFR calc Af Amer: 52 mL/min — ABNORMAL LOW (ref >60)
GFR, EST NON AFRICAN AMERICAN: 45 mL/min — AB (ref >60)
Glucose, Bld: 121 mg/dL — ABNORMAL HIGH (ref 65–99)
POTASSIUM: 4 mmol/L (ref 3.5–5.1)
SODIUM: 136 mmol/L (ref 135–145)

## 2016-12-11 LAB — CBC WITH DIFFERENTIAL/PLATELET
BASOS PCT: 0 %
Basophils Absolute: 0 10*3/uL (ref 0.0–0.1)
EOS ABS: 0.3 10*3/uL (ref 0.0–0.7)
Eosinophils Relative: 4 %
HCT: 33.4 % — ABNORMAL LOW (ref 36.0–46.0)
HEMOGLOBIN: 10.3 g/dL — AB (ref 12.0–15.0)
Lymphocytes Relative: 27 %
Lymphs Abs: 2.4 10*3/uL (ref 0.7–4.0)
MCH: 27.7 pg (ref 26.0–34.0)
MCHC: 30.8 g/dL (ref 30.0–36.0)
MCV: 89.8 fL (ref 78.0–100.0)
Monocytes Absolute: 0.6 10*3/uL (ref 0.1–1.0)
Monocytes Relative: 7 %
NEUTROS PCT: 62 %
Neutro Abs: 5.7 10*3/uL (ref 1.7–7.7)
PLATELETS: 351 10*3/uL (ref 150–400)
RBC: 3.72 MIL/uL — AB (ref 3.87–5.11)
RDW: 13.5 % (ref 11.5–15.5)
WBC: 9.1 10*3/uL (ref 4.0–10.5)

## 2016-12-11 LAB — TROPONIN I: Troponin I: <0.03 ng/mL (ref <0.03)

## 2016-12-11 NOTE — Discharge Instructions (Signed)
Use your mobic or tylenol as needed for pain. Followup with your doctor. Return to the ED if you develop new or worsening symptoms.

## 2016-12-11 NOTE — Progress Notes (Signed)
Office Visit Note   Patient: Debbie Velasquez           Date of Birth: 10/30/1950           MRN: [id number] Visit Date: 12/11/2016              Requested by: Nolene Ebbs, MD [address] [place], [place] [address] PCP: Philis Fendt, MD   Assessment & Plan: Visit Diagnoses:  1. Pain in right hip   2. Chronic pain of left knee   3. Chronic pain of right knee   4. Unilateral primary osteoarthritis, right knee   5. Unilateral primary osteoarthritis, left knee     Plan: As far as her hip pain goes this seems like it is more of a trochanteric bursitis on the right hip. I think it's how she is change walking due to her severity of her left knee and right knee pains in the arthritis. At some point she may benefit from an injection in her hip. Right now her holding her knee injections because of her diabetes. Also due to the fact that we will place a steroid injection her right knee at the time of surgery her left knee. We are recommending knee replacement surgery. Her x-rays correspond with severe trunk compartment arthritic changes. She has tried and failed all forms of conservative treatment. Her pain is daily and is detrimentally affected her activity is daily life, her mobility, and her quality of life in general. I showed her a knee model and explained in detail with the surgery involves. Overall of her x-rays and thorough discussion of the risk and benefits of surgery as well as her intraoperative and postoperative course. All questions were encouraged and answered. She does wish proceed with left total knee replacement. The same setting we'll place a steroid injection her right knee. We would see her back in 2 weeks postoperative but no x-rays of be needed.  Follow-Up Instructions: Return for 2 weeks post-op.   Orders:  Orders Placed This Encounter  Procedures  . XR HIP UNILAT W OR W/O PELVIS 1V RIGHT  . XR Knee 1-2 Views Left  . XR Knee 1-2 Views Right   No orders of  the defined types were placed in this encounter.     Procedures: No procedures performed   Clinical Data: No additional findings.   Subjective: No chief complaint on file. The patient comes in today with chief complaint of bilateral knee pain left worse than right as well as left shoulder pain and right hip pain. We've seen her for her knees before and diagnosed her with severe tricompartmental arthritis in her knees. Her left knee is much worse in the right knee and is gotten to where its locking catching daily. Her pain is with and without activities. His detrimentally affecting her mobility and her quality of life as well as her activity is daily living. It is 10 out of 10. She is already had injections in the past. She is a diabetic but are good control. Her shoulder hurts with overhead activities. Her right hip hurts more on the side and not in the groin. Always been slowly worsening over the last several years.  HPI  Review of Systems He denies any chest pain, shortness of breath, fever, chills, nausea, vomiting  Objective: Vital Signs: There were no vitals taken for this visit.  Physical Exam She is alert and oriented 3 and in no acute distress Ortho Exam Examination of her left shoulder  does show signs of impingement shoulder with good range of motion. There is obvious bursitis and tendinosis of the rotator cuff itself was intact. The shoulders well located. Examination of her right hip shows fluid range of motion her right hip with internal rotation rotation she hurts over the trochanteric area and her exam is consistent with trochanteric bursitis. Both knees have a varus deformity. There is patellofemoral crepitation and medial and lateral joint line tenderness. Both knees have good range of motion and are ligamentously stable but significantly painful. Specialty Comments:  No specialty comments available.  Imaging: Xr Hip Unilat W Or W/o Pelvis 1v Right  Result Date:  12/11/2016 An AP pelvis lateral right hip show minimal arthritic changes. The hip joint is well located. There is slight deepening of the socket. There is small para-articular osteophytes. There is no cortical irregularities around the trochanteric area  Xr Knee 1-2 Views Left  Result Date: 12/11/2016 AP and lateral of her left knee shows severe tricompartmental arthritic changes. There is a mild varus deformity. There is complete loss of medial joint space. There is periarticular osteophytes in all 3 compartments there is severe.  Xr Knee 1-2 Views Right  Result Date: 12/11/2016 An AP and lateral of right knee show severe arthritic tricompartmental changes mainly involving the medial joint line and patellofemoral joint. There is mild varus deformity. There is bone spurs throughout the knee.    PMFS History: Patient Active Problem List   Diagnosis Date Noted  . Renal mass 12/22/2015   Past Medical History:  Diagnosis Date  . Arthritis   . History of kidney stones    x3 -remains with one on right.   Marland Kitchen Hx of seasonal allergies    rare flare ups  . Hypertension   . Left renal mass   . Obesity   . Right ureteral stone   . Type 2 diabetes mellitus ([place])   . Wears glasses   . Wears partial dentures    upper    Family History  Problem Relation Age of Onset  . Esophageal cancer Maternal Aunt   . Colon cancer Neg Hx   . Stomach cancer Neg Hx   . Rectal cancer Neg Hx     Past Surgical History:  Procedure Laterality Date  . CESAREAN SECTION  yrs ago  . ROBOTIC ASSITED PARTIAL NEPHRECTOMY Left 12/22/2015   Procedure: XI ROBOTIC ASSITED PARTIAL NEPHRECTOMY;  Surgeon: Alexis Frock, MD;  Location: [facility];  Service: Urology;  Laterality: Left;  . TOTAL ABDOMINAL HYSTERECTOMY W/ BILATERAL SALPINGOOPHORECTOMY  1980's   Social History   Occupational History  . Not on file.   Social History Main Topics  . Smoking status: Never Smoker  . Smokeless tobacco: Never Used  . Alcohol use  No  . Drug use: No  . Sexual activity: Not on file

## 2016-12-11 NOTE — ED Notes (Signed)
Pt given d/c instructions as per chart. Verbalizes understanding. No questions. 

## 2016-12-22 NOTE — Progress Notes (Signed)
Need orders for 3-23 surgery in epic

## 2016-12-29 NOTE — Progress Notes (Signed)
Preop on 01/03/17.  Surgery on 01/12/17.  Need orders in epic.,

## 2017-01-01 ENCOUNTER — Other Ambulatory Visit (INDEPENDENT_AMBULATORY_CARE_PROVIDER_SITE_OTHER): Payer: Self-pay | Admitting: Physician Assistant

## 2017-01-02 ENCOUNTER — Other Ambulatory Visit (INDEPENDENT_AMBULATORY_CARE_PROVIDER_SITE_OTHER): Payer: Self-pay | Admitting: Physician Assistant

## 2017-01-02 ENCOUNTER — Other Ambulatory Visit (HOSPITAL_COMMUNITY): Payer: Self-pay | Admitting: Emergency Medicine

## 2017-01-02 NOTE — Patient Instructions (Signed)
Debbie Velasquez  01/02/2017   Your procedure is scheduled on: 01-12-17  Report to [facility]  Entrance take [facility]  elevators to 3rd floor to  [place] at [phone].  Call this number if you have problems the morning of surgery [phone]   Remember: ONLY 1 PERSON MAY GO WITH YOU TO SHORT STAY TO GET  READY MORNING OF [place].  Do not eat food or drink liquids :After Midnight.     Take these medicines the morning of surgery with A SIP OF WATER: tylenol as needed DO NOT TAKE ANY DIABETIC MEDICATIONS DAY OF YOUR SURGERY                               You may not have any metal on your body including hair pins and              piercings  Do not wear jewelry, make-up, lotions, powders or perfumes, deodorant             Do not wear nail polish.  Do not shave  48 hours prior to surgery.              Men may shave face and neck.   Do not bring valuables to the hospital. [place].  Contacts, dentures or bridgework may not be worn into surgery.  Leave suitcase in the car. After surgery it may be brought to your room.              Please read over the following fact sheets you were given: _____________________________________________________________________             How to Manage Your Diabetes Before and After Surgery  Why is it important to control my blood sugar before and after surgery? . Improving blood sugar levels before and after surgery helps healing and can limit problems. . A way of improving blood sugar control is eating a healthy diet by: o  Eating less sugar and carbohydrates o  Increasing activity/exercise o  Talking with your doctor about reaching your blood sugar goals . High blood sugars (greater than 180 mg/dL) can raise your risk of infections and slow your recovery, so you will need to focus on controlling your diabetes during the weeks before surgery. . Make sure that  the doctor who takes care of your diabetes knows about your planned surgery including the date and location.  How do I manage my blood sugar before surgery? . Check your blood sugar at least 4 times a day, starting 2 days before surgery, to make sure that the level is not too high or low. o Check your blood sugar the morning of your surgery when you wake up and every 2 hours until you get to the Short Stay unit. . If your blood sugar is less than 70 mg/dL, you will need to treat for low blood sugar: o Do not take insulin. o Treat a low blood sugar (less than 70 mg/dL) with  cup of clear juice (cranberry or apple), 4 glucose tablets, OR glucose gel. o Recheck blood sugar in 15 minutes after treatment (to make sure it is greater than 70 mg/dL). If your blood sugar is not greater than 70  mg/dL on recheck, call [phone] for further instructions. . Report your blood sugar to the short stay nurse when you get to Short Stay.  . If you are admitted to the hospital after surgery: o Your blood sugar will be checked by the staff and you will probably be given insulin after surgery (instead of oral diabetes medicines) to make sure you have good blood sugar levels. o The goal for blood sugar control after surgery is 80-180 mg/dL.   WHAT DO I DO ABOUT MY DIABETES MEDICATION?  Marland Kitchen Do not take oral diabetes medicines (pills) the morning of surgery.  . THE DAY BEFORE SURGERY, take  Your Metformin as usual       . THE MORNING OF SURGERY, DO NOT TAKE METFORMIN   Reviewed and Endorsed by [facility] Patient Education Committee, August 2015   [facility] - Preparing for Surgery Before surgery, you can play an important role.  Because skin is not sterile, your skin needs to be as free of germs as possible.  You can reduce the number of germs on your skin by washing with CHG (chlorahexidine gluconate) soap before surgery.  CHG is an antiseptic cleaner which kills germs and bonds with the skin to continue  killing germs even after washing. Please DO NOT use if you have an allergy to CHG or antibacterial soaps.  If your skin becomes reddened/irritated stop using the CHG and inform your nurse when you arrive at Short Stay. Do not shave (including legs and underarms) for at least 48 hours prior to the first CHG shower.  You may shave your face/neck. Please follow these instructions carefully:  1.  Shower with CHG Soap the night before surgery and the  morning of Surgery.  2.  If you choose to wash your hair, wash your hair first as usual with your  normal  shampoo.  3.  After you shampoo, rinse your hair and body thoroughly to remove the  shampoo.                           4.  Use CHG as you would any other liquid soap.  You can apply chg directly  to the skin and wash                       Gently with a scrungie or clean washcloth.  5.  Apply the CHG Soap to your body ONLY FROM THE NECK DOWN.   Do not use on face/ open                           Wound or open sores. Avoid contact with eyes, ears mouth and genitals (private parts).                       Wash face,  Genitals (private parts) with your normal soap.             6.  Wash thoroughly, paying special attention to the area where your surgery  will be performed.  7.  Thoroughly rinse your body with warm water from the neck down.  8.  DO NOT shower/wash with your normal soap after using and rinsing off  the CHG Soap.                9.  Pat yourself dry with a clean towel.  10.  Wear clean pajamas.            11.  Place clean sheets on your bed the night of your first shower and do not  sleep with pets. Day of Surgery : Do not apply any lotions/deodorants the morning of surgery.  Please wear clean clothes to the hospital/surgery center.  FAILURE TO FOLLOW THESE INSTRUCTIONS MAY RESULT IN THE CANCELLATION OF YOUR SURGERY PATIENT SIGNATURE_________________________________  NURSE  SIGNATURE__________________________________  ________________________________________________________________________

## 2017-01-02 NOTE — Progress Notes (Signed)
CXR 07-06-16 epic EKG 12-10-16 epic

## 2017-01-03 ENCOUNTER — Inpatient Hospital Stay (HOSPITAL_COMMUNITY)
Admission: RE | Admit: 2017-01-03 | Discharge: 2017-01-03 | Disposition: A | Discharge status: Home / Self Care | Payer: Medicare Other | Source: Ambulatory Visit

## 2017-01-03 NOTE — Patient Instructions (Signed)
Debbie Velasquez  01/03/2017   Your procedure is scheduled on: 01/12/17  Report to [facility] Main  Entrance take [place]  elevators to 3rd floor to  [place] at    [place] AM.  Call this number if you have problems the morning of surgery [phone]   Remember: ONLY 1 PERSON MAY GO WITH YOU TO SHORT STAY TO GET  READY MORNING OF YOUR SURGERY.  Do not eat food or drink liquids :After Midnight.     Take these medicines the morning of surgery with A SIP OF WATER: NONE DO NOT TAKE ANY DIABETIC MEDICATIONS DAY OF YOUR SURGERY                               You may not have any metal on your body including hair pins and              piercings  Do not wear jewelry, make-up, lotions, powders or perfumes, deodorant             Do not wear nail polish.  Do not shave  48 hours prior to surgery.                Do not bring valuables to the hospital. [place].  Contacts, dentures or bridgework may not be worn into surgery.  Leave suitcase in the car. After surgery it may be brought to your room.                  Please read over the following fact sheets you were given: _____________________________________________________________________             [facility] - Preparing for Surgery Before surgery, you can play an important role.  Because skin is not sterile, your skin needs to be as free of germs as possible.  You can reduce the number of germs on your skin by washing with CHG (chlorahexidine gluconate) soap before surgery.  CHG is an antiseptic cleaner which kills germs and bonds with the skin to continue killing germs even after washing. Please DO NOT use if you have an allergy to CHG or antibacterial soaps.  If your skin becomes reddened/irritated stop using the CHG and inform your nurse when you arrive at Short Stay. Do not shave (including legs and underarms) for at least 48 hours prior to the first CHG  shower.  You may shave your face/neck. Please follow these instructions carefully:  1.  Shower with CHG Soap the night before surgery and the  morning of Surgery.  2.  If you choose to wash your hair, wash your hair first as usual with your  normal  shampoo.  3.  After you shampoo, rinse your hair and body thoroughly to remove the  shampoo.                           4.  Use CHG as you would any other liquid soap.  You can apply chg directly  to the skin and wash                       Gently with a scrungie or clean washcloth.  5.  Apply the CHG Soap to your body ONLY FROM THE NECK DOWN.   Do not use on face/ open                           Wound or open sores. Avoid contact with eyes, ears mouth and genitals (private parts).                       Wash face,  Genitals (private parts) with your normal soap.             6.  Wash thoroughly, paying special attention to the area where your surgery  will be performed.  7.  Thoroughly rinse your body with warm water from the neck down.  8.  DO NOT shower/wash with your normal soap after using and rinsing off  the CHG Soap.                9.  Pat yourself dry with a clean towel.            10.  Wear clean pajamas.            11.  Place clean sheets on your bed the night of your first shower and do not  sleep with pets. Day of Surgery : Do not apply any lotions/deodorants the morning of surgery.  Please wear clean clothes to the hospital/surgery center.  FAILURE TO FOLLOW THESE INSTRUCTIONS MAY RESULT IN THE CANCELLATION OF YOUR SURGERY PATIENT SIGNATURE_________________________________  NURSE SIGNATURE__________________________________  ________________________________________________________________________   Adam Phenix  An incentive spirometer is a tool that can help keep your lungs clear and active. This tool measures how well you are filling your lungs with each breath. Taking long deep breaths may help reverse or decrease the chance  of developing breathing (pulmonary) problems (especially infection) following:  A long period of time when you are unable to move or be active. BEFORE THE PROCEDURE   If the spirometer includes an indicator to show your best effort, your nurse or respiratory therapist will set it to a desired goal.  If possible, sit up straight or lean slightly forward. Try not to slouch.  Hold the incentive spirometer in an upright position. INSTRUCTIONS FOR USE  1. Sit on the edge of your bed if possible, or sit up as far as you can in bed or on a chair. 2. Hold the incentive spirometer in an upright position. 3. Breathe out normally. 4. Place the mouthpiece in your mouth and seal your lips tightly around it. 5. Breathe in slowly and as deeply as possible, raising the piston or the ball toward the top of the column. 6. Hold your breath for 3-5 seconds or for as long as possible. Allow the piston or ball to fall to the bottom of the column. 7. Remove the mouthpiece from your mouth and breathe out normally. 8. Rest for a few seconds and repeat Steps 1 through 7 at least 10 times every 1-2 hours when you are awake. Take your time and take a few normal breaths between deep breaths. 9. The spirometer may include an indicator to show your best effort. Use the indicator as a goal to work toward during each repetition. 10. After each set of 10 deep breaths, practice coughing to be sure your lungs are clear. If you have an incision (the cut made at the time of surgery), support your incision when coughing by placing a  pillow or rolled up towels firmly against it. Once you are able to get out of bed, walk around indoors and cough well. You may stop using the incentive spirometer when instructed by your caregiver.  RISKS AND COMPLICATIONS  Take your time so you do not get dizzy or light-headed.  If you are in pain, you may need to take or ask for pain medication before doing incentive spirometry. It is harder to  take a deep breath if you are having pain. AFTER USE  Rest and breathe slowly and easily.  It can be helpful to keep track of a log of your progress. Your caregiver can provide you with a simple table to help with this. If you are using the spirometer at home, follow these instructions: [place] IF:   You are having difficultly using the spirometer.  You have trouble using the spirometer as often as instructed.  Your pain medication is not giving enough relief while using the spirometer.  You develop fever of 100.5 F (38.1 C) or higher. SEEK IMMEDIATE MEDICAL CARE IF:   You cough up bloody sputum that had not been present before.  You develop fever of 102 F (38.9 C) or greater.  You develop worsening pain at or near the incision site. MAKE SURE YOU:   Understand these instructions.  Will watch your condition.  Will get help right away if you are not doing well or get worse. Document Released: 02/19/2007 Document Revised: 01/01/2012 Document Reviewed: 04/22/2007 [facility] Patient Information 2014 [place], [place].   ________________________________________________________________________

## 2017-01-04 ENCOUNTER — Encounter (HOSPITAL_COMMUNITY): Payer: Self-pay

## 2017-01-04 ENCOUNTER — Encounter (HOSPITAL_COMMUNITY)
Admission: RE | Admit: 2017-01-04 | Discharge: 2017-01-04 | Disposition: A | Discharge status: Home / Self Care | Payer: Medicare Other | Source: Ambulatory Visit | Attending: Orthopaedic Surgery | Admitting: Orthopaedic Surgery

## 2017-01-04 DIAGNOSIS — I10 Essential (primary) hypertension: Secondary | ICD-10-CM | POA: Insufficient documentation

## 2017-01-04 DIAGNOSIS — Z01812 Encounter for preprocedural laboratory examination: Secondary | ICD-10-CM | POA: Diagnosis not present

## 2017-01-04 DIAGNOSIS — M170 Bilateral primary osteoarthritis of knee: Secondary | ICD-10-CM | POA: Diagnosis not present

## 2017-01-04 DIAGNOSIS — E119 Type 2 diabetes mellitus without complications: Secondary | ICD-10-CM | POA: Insufficient documentation

## 2017-01-04 HISTORY — DX: Pneumonia, unspecified organism: J18.9

## 2017-01-04 HISTORY — DX: Malignant (primary) neoplasm, unspecified: C80.1

## 2017-01-04 LAB — BASIC METABOLIC PANEL
ANION GAP: 7 (ref 5–15)
BUN: 22 mg/dL — ABNORMAL HIGH (ref 6–20)
CALCIUM: 9.7 mg/dL (ref 8.9–10.3)
CHLORIDE: 104 mmol/L (ref 101–111)
CO2: 28 mmol/L (ref 22–32)
Creatinine, Ser: 1.12 mg/dL — ABNORMAL HIGH (ref 0.44–1.00)
GFR calc non Af Amer: 50 mL/min — ABNORMAL LOW (ref >60)
GFR, EST AFRICAN AMERICAN: 58 mL/min — AB (ref >60)
Glucose, Bld: 103 mg/dL — ABNORMAL HIGH (ref 65–99)
POTASSIUM: 4.5 mmol/L (ref 3.5–5.1)
Sodium: 139 mmol/L (ref 135–145)

## 2017-01-04 LAB — CBC
HEMATOCRIT: 33.6 % — AB (ref 36.0–46.0)
HEMOGLOBIN: 10.5 g/dL — AB (ref 12.0–15.0)
MCH: 27.1 pg (ref 26.0–34.0)
MCHC: 31.3 g/dL (ref 30.0–36.0)
MCV: 86.8 fL (ref 78.0–100.0)
Platelets: 378 10*3/uL (ref 150–400)
RBC: 3.87 MIL/uL (ref 3.87–5.11)
RDW: 13.4 % (ref 11.5–15.5)
WBC: 8.4 10*3/uL (ref 4.0–10.5)

## 2017-01-04 LAB — SURGICAL PCR SCREEN
MRSA, PCR: NEGATIVE
Staphylococcus aureus: NEGATIVE

## 2017-01-04 LAB — GLUCOSE, CAPILLARY: Glucose-Capillary: 101 mg/dL — ABNORMAL HIGH (ref 65–99)

## 2017-01-04 NOTE — Progress Notes (Signed)
EKG 12/12/16 epic cxr 07/06/16 epic

## 2017-01-04 NOTE — Progress Notes (Signed)
CBC done 01/04/17 routed to Dr. Ninfa Linden via epic

## 2017-01-05 LAB — HEMOGLOBIN A1C
Hgb A1c MFr Bld: 6.2 % — ABNORMAL HIGH (ref 4.8–5.6)
Mean Plasma Glucose: 131 mg/dL

## 2017-01-11 MED ORDER — DEXTROSE 5 % IV SOLN
3.0000 g | INTRAVENOUS | Status: AC
  Administered 2017-01-12: 3 g via INTRAVENOUS
  Filled 2017-01-11 (×3): qty 3000

## 2017-01-12 ENCOUNTER — Inpatient Hospital Stay (HOSPITAL_COMMUNITY)
Admission: AD | Admit: 2017-01-12 | Discharge: 2017-01-15 | DRG: 470 | Disposition: A | Discharge status: Skilled Nursing Facility | Payer: Medicare Other | Source: Ambulatory Visit | Attending: Orthopaedic Surgery | Admitting: Orthopaedic Surgery

## 2017-01-12 ENCOUNTER — Encounter (HOSPITAL_COMMUNITY)
Admission: AD | Disposition: A | Discharge status: Skilled Nursing Facility | Payer: Self-pay | Source: Ambulatory Visit | Attending: Orthopaedic Surgery

## 2017-01-12 ENCOUNTER — Encounter (HOSPITAL_COMMUNITY): Payer: Self-pay | Admitting: Anesthesiology

## 2017-01-12 ENCOUNTER — Inpatient Hospital Stay (HOSPITAL_COMMUNITY): Payer: Medicare Other

## 2017-01-12 ENCOUNTER — Ambulatory Visit (HOSPITAL_COMMUNITY): Payer: Medicare Other | Admitting: Anesthesiology

## 2017-01-12 DIAGNOSIS — D62 Acute posthemorrhagic anemia: Secondary | ICD-10-CM | POA: Diagnosis not present

## 2017-01-12 DIAGNOSIS — Z96652 Presence of left artificial knee joint: Secondary | ICD-10-CM

## 2017-01-12 DIAGNOSIS — Z8553 Personal history of malignant neoplasm of renal pelvis: Secondary | ICD-10-CM | POA: Diagnosis not present

## 2017-01-12 DIAGNOSIS — Z87442 Personal history of urinary calculi: Secondary | ICD-10-CM

## 2017-01-12 DIAGNOSIS — Z7984 Long term (current) use of oral hypoglycemic drugs: Secondary | ICD-10-CM

## 2017-01-12 DIAGNOSIS — Z6841 Body Mass Index (BMI) 40.0 and over, adult: Secondary | ICD-10-CM | POA: Diagnosis not present

## 2017-01-12 DIAGNOSIS — I10 Essential (primary) hypertension: Secondary | ICD-10-CM | POA: Diagnosis present

## 2017-01-12 DIAGNOSIS — M1712 Unilateral primary osteoarthritis, left knee: Principal | ICD-10-CM

## 2017-01-12 DIAGNOSIS — E669 Obesity, unspecified: Secondary | ICD-10-CM | POA: Diagnosis present

## 2017-01-12 DIAGNOSIS — Z79899 Other long term (current) drug therapy: Secondary | ICD-10-CM

## 2017-01-12 DIAGNOSIS — E119 Type 2 diabetes mellitus without complications: Secondary | ICD-10-CM | POA: Diagnosis present

## 2017-01-12 DIAGNOSIS — M170 Bilateral primary osteoarthritis of knee: Secondary | ICD-10-CM | POA: Diagnosis present

## 2017-01-12 DIAGNOSIS — Z7982 Long term (current) use of aspirin: Secondary | ICD-10-CM | POA: Diagnosis not present

## 2017-01-12 HISTORY — PX: TOTAL KNEE ARTHROPLASTY: SHX125

## 2017-01-12 LAB — GLUCOSE, CAPILLARY
GLUCOSE-CAPILLARY: 143 mg/dL — AB (ref 65–99)
Glucose-Capillary: 119 mg/dL — ABNORMAL HIGH (ref 65–99)
Glucose-Capillary: 124 mg/dL — ABNORMAL HIGH (ref 65–99)
Glucose-Capillary: 185 mg/dL — ABNORMAL HIGH (ref 65–99)

## 2017-01-12 SURGERY — ARTHROPLASTY, KNEE, TOTAL
Anesthesia: Spinal | Site: Knee | Laterality: Left

## 2017-01-12 MED ORDER — TRANEXAMIC ACID 1000 MG/10ML IV SOLN
1000.0000 mg | INTRAVENOUS | Status: AC
  Administered 2017-01-12: 1000 mg via INTRAVENOUS
  Filled 2017-01-12: qty 1100

## 2017-01-12 MED ORDER — MEPERIDINE HCL 50 MG/ML IJ SOLN: 6.2500 mg | INTRAMUSCULAR | Status: DC | PRN

## 2017-01-12 MED ORDER — DOCUSATE SODIUM 100 MG PO CAPS
100.0000 mg | ORAL_CAPSULE | Freq: Two times a day (BID) | ORAL | Status: DC
  Administered 2017-01-12 – 2017-01-15 (×6): 100 mg via ORAL
  Filled 2017-01-12 (×6): qty 1

## 2017-01-12 MED ORDER — METHOCARBAMOL 1000 MG/10ML IJ SOLN
500.0000 mg | Freq: Four times a day (QID) | INTRAVENOUS | Status: DC | PRN
  Filled 2017-01-12: qty 5

## 2017-01-12 MED ORDER — LOSARTAN POTASSIUM 50 MG PO TABS
100.0000 mg | ORAL_TABLET | Freq: Every day | ORAL | Status: DC
  Administered 2017-01-12 – 2017-01-15 (×4): 100 mg via ORAL
  Filled 2017-01-12 (×4): qty 2

## 2017-01-12 MED ORDER — HYDROMORPHONE HCL 1 MG/ML IJ SOLN: 1.0000 mg | INTRAMUSCULAR | Status: DC | PRN

## 2017-01-12 MED ORDER — ACETAMINOPHEN 650 MG RE SUPP: 650.0000 mg | Freq: Four times a day (QID) | RECTAL | Status: DC | PRN

## 2017-01-12 MED ORDER — FENTANYL CITRATE (PF) 100 MCG/2ML IJ SOLN
50.0000 ug | INTRAMUSCULAR | Status: DC | PRN
  Administered 2017-01-12: 100 ug via INTRAVENOUS

## 2017-01-12 MED ORDER — LIDOCAINE HCL 1 % IJ SOLN
INTRAMUSCULAR | Status: AC
  Filled 2017-01-12: qty 20

## 2017-01-12 MED ORDER — EPHEDRINE 5 MG/ML INJ
INTRAVENOUS | Status: AC
  Filled 2017-01-12: qty 10

## 2017-01-12 MED ORDER — PHENYLEPHRINE 40 MCG/ML (10ML) SYRINGE FOR IV PUSH (FOR BLOOD PRESSURE SUPPORT)
PREFILLED_SYRINGE | INTRAVENOUS | Status: AC
  Filled 2017-01-12: qty 10

## 2017-01-12 MED ORDER — LACTATED RINGERS IV SOLN: INTRAVENOUS | Status: DC

## 2017-01-12 MED ORDER — HYDROCHLOROTHIAZIDE 12.5 MG PO CAPS
12.5000 mg | ORAL_CAPSULE | Freq: Every day | ORAL | Status: DC
  Administered 2017-01-12 – 2017-01-15 (×4): 12.5 mg via ORAL
  Filled 2017-01-12 (×4): qty 1

## 2017-01-12 MED ORDER — DEXAMETHASONE SODIUM PHOSPHATE 10 MG/ML IJ SOLN
INTRAMUSCULAR | Status: AC
  Filled 2017-01-12: qty 1

## 2017-01-12 MED ORDER — ONDANSETRON HCL 4 MG/2ML IJ SOLN: 4.0000 mg | Freq: Four times a day (QID) | INTRAMUSCULAR | Status: DC | PRN

## 2017-01-12 MED ORDER — DEXAMETHASONE SODIUM PHOSPHATE 10 MG/ML IJ SOLN
INTRAMUSCULAR | Status: DC | PRN
  Administered 2017-01-12: 10 mg via INTRAVENOUS

## 2017-01-12 MED ORDER — ASPIRIN 81 MG PO CHEW
81.0000 mg | CHEWABLE_TABLET | Freq: Two times a day (BID) | ORAL | Status: DC
  Administered 2017-01-12 – 2017-01-15 (×6): 81 mg via ORAL
  Filled 2017-01-12 (×6): qty 1

## 2017-01-12 MED ORDER — DIPHENHYDRAMINE HCL 12.5 MG/5ML PO ELIX: 12.5000 mg | ORAL_SOLUTION | ORAL | Status: DC | PRN

## 2017-01-12 MED ORDER — INSULIN ASPART 100 UNIT/ML ~~LOC~~ SOLN: 0.0000 [IU] | Freq: Every day | SUBCUTANEOUS | Status: DC

## 2017-01-12 MED ORDER — PHENYLEPHRINE 40 MCG/ML (10ML) SYRINGE FOR IV PUSH (FOR BLOOD PRESSURE SUPPORT)
PREFILLED_SYRINGE | INTRAVENOUS | Status: DC | PRN
  Administered 2017-01-12 (×8): 80 ug via INTRAVENOUS

## 2017-01-12 MED ORDER — PHENOL 1.4 % MT LIQD
1.0000 | OROMUCOSAL | Status: DC | PRN
  Filled 2017-01-12: qty 177

## 2017-01-12 MED ORDER — METHYLPREDNISOLONE SODIUM SUCC 125 MG IJ SOLR
INTRAMUSCULAR | Status: AC
  Filled 2017-01-12: qty 2

## 2017-01-12 MED ORDER — LACTATED RINGERS IV SOLN
INTRAVENOUS | Status: DC
  Administered 2017-01-12 (×4): via INTRAVENOUS

## 2017-01-12 MED ORDER — ALUM & MAG HYDROXIDE-SIMETH 200-200-20 MG/5ML PO SUSP: 30.0000 mL | ORAL | Status: DC | PRN

## 2017-01-12 MED ORDER — METHOCARBAMOL 500 MG PO TABS
500.0000 mg | ORAL_TABLET | Freq: Four times a day (QID) | ORAL | Status: DC | PRN
  Administered 2017-01-13 – 2017-01-14 (×5): 500 mg via ORAL
  Filled 2017-01-12 (×8): qty 1

## 2017-01-12 MED ORDER — CHLORHEXIDINE GLUCONATE 4 % EX LIQD: 60.0000 mL | Freq: Once | CUTANEOUS | Status: DC

## 2017-01-12 MED ORDER — LIDOCAINE HCL 1 % IJ SOLN
INTRAMUSCULAR | Status: DC | PRN
  Administered 2017-01-12: 3 mL

## 2017-01-12 MED ORDER — INSULIN ASPART 100 UNIT/ML ~~LOC~~ SOLN
0.0000 [IU] | Freq: Three times a day (TID) | SUBCUTANEOUS | Status: DC
  Administered 2017-01-12: 18:00:00 3 [IU] via SUBCUTANEOUS

## 2017-01-12 MED ORDER — CEFAZOLIN IN D5W 1 GM/50ML IV SOLN
1.0000 g | Freq: Four times a day (QID) | INTRAVENOUS | Status: AC
  Administered 2017-01-12 (×2): 1 g via INTRAVENOUS
  Filled 2017-01-12 (×2): qty 50

## 2017-01-12 MED ORDER — ONDANSETRON HCL 4 MG/2ML IJ SOLN
INTRAMUSCULAR | Status: AC
  Filled 2017-01-12: qty 2

## 2017-01-12 MED ORDER — HYDROMORPHONE HCL 1 MG/ML IJ SOLN: 0.2500 mg | INTRAMUSCULAR | Status: DC | PRN

## 2017-01-12 MED ORDER — PROPOFOL 500 MG/50ML IV EMUL
INTRAVENOUS | Status: DC | PRN
Start: 2017-01-12 — End: 2017-01-12
  Administered 2017-01-12: 65 ug/kg/min via INTRAVENOUS

## 2017-01-12 MED ORDER — MIDAZOLAM HCL 2 MG/2ML IJ SOLN
1.0000 mg | INTRAMUSCULAR | Status: DC | PRN
  Administered 2017-01-12: 2 mg via INTRAVENOUS
  Filled 2017-01-12: qty 2

## 2017-01-12 MED ORDER — ONDANSETRON HCL 4 MG PO TABS: 4.0000 mg | ORAL_TABLET | Freq: Four times a day (QID) | ORAL | Status: DC | PRN

## 2017-01-12 MED ORDER — BUPIVACAINE IN DEXTROSE 0.75-8.25 % IT SOLN
INTRATHECAL | Status: DC | PRN
  Administered 2017-01-12: 2 mL via INTRATHECAL

## 2017-01-12 MED ORDER — POLYETHYLENE GLYCOL 3350 17 G PO PACK: 17.0000 g | PACK | Freq: Every day | ORAL | Status: DC | PRN

## 2017-01-12 MED ORDER — PROPOFOL 10 MG/ML IV BOLUS
INTRAVENOUS | Status: AC
  Filled 2017-01-12: qty 60

## 2017-01-12 MED ORDER — ALUM HYDROXIDE-MAG TRISILICATE 80-20 MG PO CHEW
2.0000 | CHEWABLE_TABLET | Freq: Two times a day (BID) | ORAL | Status: DC | PRN
  Filled 2017-01-12: qty 2

## 2017-01-12 MED ORDER — ONDANSETRON HCL 4 MG/2ML IJ SOLN
INTRAMUSCULAR | Status: DC | PRN
  Administered 2017-01-12: 4 mg via INTRAVENOUS

## 2017-01-12 MED ORDER — MIDAZOLAM HCL 2 MG/2ML IJ SOLN
INTRAMUSCULAR | Status: AC
  Administered 2017-01-12: 2 mg via INTRAVENOUS
  Filled 2017-01-12: qty 2

## 2017-01-12 MED ORDER — SODIUM CHLORIDE 0.9 % IR SOLN
Status: DC | PRN
Start: 2017-01-12 — End: 2017-01-12
  Administered 2017-01-12: 1000 mL

## 2017-01-12 MED ORDER — PROMETHAZINE HCL 25 MG/ML IJ SOLN: 6.2500 mg | INTRAMUSCULAR | Status: DC | PRN

## 2017-01-12 MED ORDER — MENTHOL 3 MG MT LOZG: 1.0000 | LOZENGE | OROMUCOSAL | Status: DC | PRN

## 2017-01-12 MED ORDER — METHYLPREDNISOLONE ACETATE 40 MG/ML IJ SUSP
INTRAMUSCULAR | Status: AC
  Filled 2017-01-12: qty 1

## 2017-01-12 MED ORDER — ALUM HYDROXIDE-MAG TRISILICATE 80-14.2 MG PO CHEW
2.0000 | CHEWABLE_TABLET | Freq: Two times a day (BID) | ORAL | Status: DC | PRN
  Filled 2017-01-12: qty 1

## 2017-01-12 MED ORDER — METOCLOPRAMIDE HCL 5 MG/ML IJ SOLN: 5.0000 mg | Freq: Three times a day (TID) | INTRAMUSCULAR | Status: DC | PRN

## 2017-01-12 MED ORDER — ACETAMINOPHEN 325 MG PO TABS: 650.0000 mg | ORAL_TABLET | Freq: Four times a day (QID) | ORAL | Status: DC | PRN

## 2017-01-12 MED ORDER — METHYLPREDNISOLONE ACETATE 40 MG/ML IJ SUSP
INTRAMUSCULAR | Status: DC | PRN
  Administered 2017-01-12: 40 mg via INTRA_ARTICULAR

## 2017-01-12 MED ORDER — METFORMIN HCL 500 MG PO TABS
500.0000 mg | ORAL_TABLET | Freq: Two times a day (BID) | ORAL | Status: DC
  Administered 2017-01-12 – 2017-01-14 (×4): 500 mg via ORAL
  Filled 2017-01-12 (×6): qty 1

## 2017-01-12 MED ORDER — OXYCODONE HCL 5 MG PO TABS
5.0000 mg | ORAL_TABLET | ORAL | Status: DC | PRN
  Administered 2017-01-12 – 2017-01-14 (×10): 10 mg via ORAL
  Administered 2017-01-14: 5 mg via ORAL
  Administered 2017-01-14 (×2): 10 mg via ORAL
  Administered 2017-01-15: 12:00:00 5 mg via ORAL
  Filled 2017-01-12: qty 1
  Filled 2017-01-12 (×14): qty 2

## 2017-01-12 MED ORDER — LOSARTAN POTASSIUM-HCTZ 100-12.5 MG PO TABS: 1.0000 | ORAL_TABLET | Freq: Every day | ORAL | Status: DC

## 2017-01-12 MED ORDER — METOCLOPRAMIDE HCL 5 MG PO TABS: 5.0000 mg | ORAL_TABLET | Freq: Three times a day (TID) | ORAL | Status: DC | PRN

## 2017-01-12 MED ORDER — FENTANYL CITRATE (PF) 100 MCG/2ML IJ SOLN
INTRAMUSCULAR | Status: AC
  Administered 2017-01-12: 100 ug via INTRAVENOUS
  Filled 2017-01-12: qty 2

## 2017-01-12 MED ORDER — SODIUM CHLORIDE 0.9 % IV SOLN
INTRAVENOUS | Status: DC
  Administered 2017-01-12: 18:00:00 via INTRAVENOUS

## 2017-01-12 MED ORDER — PROPOFOL 10 MG/ML IV BOLUS
INTRAVENOUS | Status: AC
  Filled 2017-01-12: qty 20

## 2017-01-12 MED ORDER — EPHEDRINE SULFATE 50 MG/ML IJ SOLN
INTRAMUSCULAR | Status: DC | PRN
Start: 2017-01-12 — End: 2017-01-12
  Administered 2017-01-12 (×7): 5 mg via INTRAVENOUS

## 2017-01-12 SURGICAL SUPPLY — 51 items
APL SKNCLS STERI-STRIP NONHPOA (GAUZE/BANDAGES/DRESSINGS) ×1
BAG SPEC THK2 15X12 ZIP CLS (MISCELLANEOUS)
BAG ZIPLOCK 12X15 (MISCELLANEOUS) IMPLANT
BANDAGE ACE 6X5 VEL STRL LF (GAUZE/BANDAGES/DRESSINGS) ×3 IMPLANT
BANDAGE ELASTIC 6 VELCRO ST LF (GAUZE/BANDAGES/DRESSINGS) ×2 IMPLANT
BENZOIN TINCTURE PRP APPL 2/3 (GAUZE/BANDAGES/DRESSINGS) ×2 IMPLANT
BLADE SAG 18X100X1.27 (BLADE) IMPLANT
BOWL SMART MIX CTS (DISPOSABLE) ×3 IMPLANT
CAPT KNEE TOTAL 3 ×2 IMPLANT
CEMENT BONE SIMPLEX SPEEDSET (Cement) ×6 IMPLANT
CLOSURE WOUND 1/2 X4 (GAUZE/BANDAGES/DRESSINGS) ×1
CUFF TOURN SGL QUICK 34 (TOURNIQUET CUFF) ×3
CUFF TRNQT CYL 34X4X40X1 (TOURNIQUET CUFF) ×1 IMPLANT
DRAPE U-SHAPE 47X51 STRL (DRAPES) ×3 IMPLANT
DRSG AQUACEL AG ADV 3.5X10 (GAUZE/BANDAGES/DRESSINGS) ×3 IMPLANT
DRSG PAD ABDOMINAL 8X10 ST (GAUZE/BANDAGES/DRESSINGS) ×3 IMPLANT
DURAPREP 26ML APPLICATOR (WOUND CARE) ×3 IMPLANT
ELECT REM PT RETURN 15FT ADLT (MISCELLANEOUS) ×3 IMPLANT
GAUZE SPONGE 4X4 12PLY STRL (GAUZE/BANDAGES/DRESSINGS) ×3 IMPLANT
GAUZE XEROFORM 1X8 LF (GAUZE/BANDAGES/DRESSINGS) IMPLANT
GLOVE BIO SURGEON STRL SZ7.5 (GLOVE) ×3 IMPLANT
GLOVE BIOGEL PI IND STRL 8 (GLOVE) ×2 IMPLANT
GLOVE BIOGEL PI INDICATOR 8 (GLOVE) ×4
GLOVE ECLIPSE 8.0 STRL XLNG CF (GLOVE) ×3 IMPLANT
GOWN STRL REUS W/TWL XL LVL3 (GOWN DISPOSABLE) ×6 IMPLANT
HANDPIECE INTERPULSE COAX TIP (DISPOSABLE) ×3
IMMOBILIZER KNEE 20 (SOFTGOODS) ×3
IMMOBILIZER KNEE 20 THIGH 36 (SOFTGOODS) ×1 IMPLANT
NS IRRIG 1000ML POUR BTL (IV SOLUTION) ×5 IMPLANT
PACK TOTAL KNEE CUSTOM (KITS) ×3 IMPLANT
PAD ABD 8X10 STRL (GAUZE/BANDAGES/DRESSINGS) ×2 IMPLANT
PADDING CAST COTTON 6X4 STRL (CAST SUPPLIES) ×4 IMPLANT
POSITIONER SURGICAL ARM (MISCELLANEOUS) ×3 IMPLANT
SET HNDPC FAN SPRY TIP SCT (DISPOSABLE) ×1 IMPLANT
SET PAD KNEE POSITIONER (MISCELLANEOUS) ×3 IMPLANT
STAPLER VISISTAT 35W (STAPLE) IMPLANT
STOCKINETTE 8 INCH (MISCELLANEOUS) ×2 IMPLANT
STRIP CLOSURE SKIN 1/2X4 (GAUZE/BANDAGES/DRESSINGS) ×1 IMPLANT
SUT MNCRL AB 4-0 PS2 18 (SUTURE) IMPLANT
SUT VIC AB 0 CT1 27 (SUTURE) ×3
SUT VIC AB 0 CT1 27XBRD ANTBC (SUTURE) ×1 IMPLANT
SUT VIC AB 1 CT1 27 (SUTURE) ×6
SUT VIC AB 1 CT1 27XBRD ANTBC (SUTURE) ×2 IMPLANT
SUT VIC AB 2-0 CT1 27 (SUTURE) ×6
SUT VIC AB 2-0 CT1 TAPERPNT 27 (SUTURE) ×2 IMPLANT
TAPE CLOTH 2X10 TAN LF (GAUZE/BANDAGES/DRESSINGS) ×2 IMPLANT
TRAY FOLEY W/METER SILVER 16FR (SET/KITS/TRAYS/PACK) ×3 IMPLANT
WATER STERILE IRR 1000ML POUR (IV SOLUTION) ×4 IMPLANT
WATER STERILE IRR 1500ML POUR (IV SOLUTION) ×1 IMPLANT
WRAP KNEE MAXI GEL POST OP (GAUZE/BANDAGES/DRESSINGS) ×3 IMPLANT
YANKAUER SUCT BULB TIP 10FT TU (MISCELLANEOUS) ×3 IMPLANT

## 2017-01-12 NOTE — Anesthesia Procedure Notes (Signed)
Anesthesia Regional Block: Adductor canal block   Pre-Anesthetic Checklist: ,, timeout performed, Correct Patient, Correct Site, Correct Laterality, Correct Procedure, Correct Position, site marked, Risks and benefits discussed,  Surgical consent,  Pre-op evaluation,  At surgeon's request and post-op pain management  Laterality: Left  Prep: chloraprep       Needles:  Injection technique: Single-shot  Needle Type: Echogenic Needle     Needle Length: 9cm  Needle Gauge: 21     Additional Needles:   Procedures: ultrasound guided,,,,,,,,  Narrative:  Start time: 01/12/2017 9:10 AM End time: 01/12/2017 9:15 AM Injection made incrementally with aspirations every 5 mL.  Performed by: Personally  Anesthesiologist: Suella Broad D  Additional Notes: Tolerated well.

## 2017-01-12 NOTE — Anesthesia Preprocedure Evaluation (Signed)
Anesthesia Evaluation  Patient identified by MRN, date of birth, ID band Patient awake    Reviewed: Allergy & Precautions, NPO status , Patient's Chart, lab work & pertinent test results  Airway Mallampati: II  TM Distance: >3 FB Neck ROM: Full    Dental  (+) Teeth Intact, Dental Advisory Given   Pulmonary pneumonia,    breath sounds clear to auscultation       Cardiovascular hypertension, Pt. on medications  Rhythm:Regular Rate:Normal     Neuro/Psych negative neurological ROS  negative psych ROS   GI/Hepatic negative GI ROS, Neg liver ROS,   Endo/Other  diabetes, Type 2, Oral Hypoglycemic Agents  Renal/GU negative Renal ROS  negative genitourinary   Musculoskeletal  (+) Arthritis , Osteoarthritis,    Abdominal   Peds negative pediatric ROS (+)  Hematology negative hematology ROS (+)   Anesthesia Other Findings   Reproductive/Obstetrics negative OB ROS                             Lab Results  Component Value Date   WBC 8.4 01/04/2017   HGB 10.5 (L) 01/04/2017   HCT 33.6 (L) 01/04/2017   MCV 86.8 01/04/2017   PLT 378 01/04/2017    Anesthesia Physical Anesthesia Plan  ASA: III  Anesthesia Plan: Spinal   Post-op Pain Management:  Regional for Post-op pain   Induction: Intravenous  Airway Management Planned: Natural Airway  Additional Equipment:   Intra-op Plan:   Post-operative Plan:   Informed Consent: I have reviewed the patients History and Physical, chart, labs and discussed the procedure including the risks, benefits and alternatives for the proposed anesthesia with the patient or authorized representative who has indicated his/her understanding and acceptance.   Dental advisory given  Plan Discussed with: CRNA  Anesthesia Plan Comments:         Anesthesia Quick Evaluation

## 2017-01-12 NOTE — Anesthesia Postprocedure Evaluation (Signed)
Anesthesia Post Note  Patient: Debbie Velasquez  Procedure(s) Performed: Procedure(s) (LRB): LEFT TOTAL KNEE ARTHROPLASTY and Right knee steroid injection (Left)  Patient location during evaluation: PACU Anesthesia Type: Spinal Level of consciousness: oriented and awake and alert Pain management: pain level controlled Vital Signs Assessment: post-procedure vital signs reviewed and stable Respiratory status: spontaneous breathing, respiratory function stable and patient connected to nasal cannula oxygen Cardiovascular status: blood pressure returned to baseline and stable Postop Assessment: no headache, no backache and spinal receding Anesthetic complications: no       Last Vitals:  Vitals:   01/12/17 1230 01/12/17 1245  BP: 138/88 (!) 122/58  Pulse: 69 67  Resp: 12 15  Temp: 36.3 C 36.3 C    Last Pain:  Vitals:   01/12/17 1215  TempSrc:   PainSc: 0-No pain                 Effie Berkshire

## 2017-01-12 NOTE — Transfer of Care (Signed)
Immediate Anesthesia Transfer of Care Note  Patient: Debbie Velasquez  Procedure(s) Performed: Procedure(s): LEFT TOTAL KNEE ARTHROPLASTY and Right knee steroid injection (Left)  Patient Location: PACU  Anesthesia Type:MAC combined with regional for post-op pain  Level of Consciousness:  sedated, patient cooperative and responds to stimulation  Airway & Oxygen Therapy:Patient Spontanous Breathing and Patient connected to face mask oxgen  Post-op Assessment:  Report given to PACU RN and Post -op Vital signs reviewed and stable  Post vital signs:  Reviewed and stable  Last Vitals:  Vitals:   01/12/17 0916 01/12/17 0918  BP:  (!) 135/54  Pulse: 71   Resp:    Temp:      Complications: No apparent anesthesia complications

## 2017-01-12 NOTE — Brief Op Note (Signed)
01/12/2017  11:03 AM  PATIENT:  Debbie Velasquez  66 y.o. female  PRE-OPERATIVE DIAGNOSIS:  bilateral knee osteoarthritis  POST-OPERATIVE DIAGNOSIS:  bilateral knee osteoarthritis  PROCEDURE:  Procedure(s): LEFT TOTAL KNEE ARTHROPLASTY and Right knee steroid injection (Left)  SURGEON:  Surgeon(s) and Role:    * Mcarthur Rossetti, MD - Primary  PHYSICIAN ASSISTANT: Benita Stabile, PA-C  ANESTHESIA:   regional and spinal  EBL:  Total I/O In: 1800 [I.V.:1800] Out: 300 [Urine:250; Blood:50]  COUNTS:  YES  TOURNIQUET:   Total Tourniquet Time Documented: Thigh (Left) - 45 minutes Total: Thigh (Left) - 45 minutes   DICTATION: .Other Dictation: Dictation Number [id number]  PLAN OF CARE: Admit to inpatient   PATIENT DISPOSITION:  PACU - hemodynamically stable.   Delay start of Pharmacological VTE agent (>24hrs) due to surgical blood loss or risk of bleeding: no

## 2017-01-12 NOTE — Progress Notes (Signed)
Assisted Dr. Hollis with left, ultrasound guided, adductor canal block. Side rails up, monitors on throughout procedure. See vital signs in flow sheet. Tolerated Procedure well.  

## 2017-01-12 NOTE — Op Note (Signed)
NAME:  Debbie Velasquez, Debbie Velasquez NO.:  MEDICAL RECORD NO.:  [id number]  LOCATION:                                 FACILITY:  PHYSICIAN:  Lind Guest. Ninfa Linden, M.D.DATE OF BIRTH:  1951-02-22  DATE OF PROCEDURE:  01/12/2017 DATE OF DISCHARGE:                              OPERATIVE REPORT   PREOPERATIVE DIAGNOSIS:  Primary osteoarthritis and degenerative joint disease, left knee.  POSTOPERATIVE DIAGNOSIS:  Primary osteoarthritis and degenerative joint disease, left knee.  PROCEDURE:  Left total knee arthroplasty.  IMPLANTS:  Stryker Triathlon knee with size 3 femur, size 4 tibial tray, 13 mm fix-bearing polyethylene insert, size 35 patella button.  SURGEON:  Lind Guest. Ninfa Linden, M.D.  ASSISTANT:  Erskine Emery, PA-C.  ANESTHESIA: 1. Left lower extremity adductor canal block. 2. Spinal.  TOURNIQUET TIME:  Less than 1 hour.  BLOOD LOSS:  Less than 100 mL.  COMPLICATIONS:  None.  ANTIBIOTICS:  IV Ancef 3 g.  INDICATIONS:  Debbie Velasquez is a very pleasant 66 year old, with debilitating arthritis involving her left and right knees.  She is presenting for a left total knee arthroplasty given the failure of conservative treatment.  Of note, we did have her on her consent for placing a steroid injection in her right knee.  The risks and benefits of surgery were explained to her in detail including risk of acute blood loss anemia, nerve and vessel injury, fracture, infection, DVT.  She understands our goals are decreased pain, improved mobility, and overall improved quality of life.  PROCEDURE DESCRIPTION:  After informed consent was obtained, appropriate left knee was marked for knee replacement and right knee was marked for steroid injection.  She was brought to the operating room, and Anesthesia obtained an adductor canal block on the left lower extremity. Spinal anesthesia was obtained as well.  A Foley catheter was placed. She was laid supine on  the operating table.  Time-out was called first for the steroid injection of right knee superior lateral aspect.  Her knee was prepped and draped with Betadine and alcohol on the right side, and then we placed an injection of 3 mL of lidocaine mixed with 1 mL of Depo-Medrol which she tolerated well.  We placed a Band-Aid on that side.  We then placed a nonsterile tourniquet around her right upper thigh.  Her right leg was then prepped and draped from the thigh down to the toes with DuraPrep and sterile drapes and a sterile stockinette. Time-out was called and she was identified as correct patient and correct left knee for total knee replacement.  With the bed raised, we were able to use an Esmarch to wrap out the leg and tourniquet was inflated to 300 mm of pressure.  We then made a direct midline incision over the patella and carried this proximally and distally.  We dissected down the knee joint and carried out a medial parapatellar arthrotomy. We found a large joint effusion and significant periarticular osteophytes and worn cartilage throughout the knee.  With the knee in a flexed position, we removed remnants of the ACL, PCL, medial and lateral meniscus.  We used the extramedullary cutting guide on the tibia setting our tibial cut  for taking 9 mm off the high side correcting for varus and valgus and neutral slope.  We made this cut without difficulty.  We then went to the femur.  We made our femoral cut based off the epicondylar axis and placing intramedullary guide setting an 8 mm distal femoral cut off guide set at 5 degrees externally rotated for the left knee.  We made our distal femoral cut without difficulty.  We brought the knee back down to full extension with a 9-mm extension block.  She actually even hyperextended a little bit.  We then went back to the femur with the knee in a flexed position, used our epicondylar axis for femoral sizing guide and chose a size 3 femur.  We  made our 4-in-1 cutting block for a size 3 femur, made our anterior and posterior cuts followed by our chamfer cuts.  We then made our femoral box cut.  We went back to the tibia and chose a size 4 tibial tray.  We made our drill cut off this for universal base plate and then made our keel cut. With a size 4 tibial trial followed by the 3 femur, we trialed up to a 13 mm polyethylene insert.  We were pleased with the 13 mm insert.  We then made our patellar cut and drilled 3 holes for a size 35 patellar button.  We then removed all trial components and irrigated the knee with normal saline solution.  We mixed our cement and cemented the real Stryker Triathlon universal base plate for the tibial tray size 4 followed by the real size 3 femur.  We placed a 13 mm fix-bearing polyethylene insert and cemented our patellar button.  Once the cement had hardened, hemostasis was obtained with electrocautery.  Once we let the tourniquet down, we irrigated the knee with normal saline solution using pulsatile lavage.  We then closed the arthrotomy with interrupted #1 Vicryl suture followed by 0 Vicryl in the deep tissue, 2-0 Vicryl in the subcutaneous tissue, 4-0 Monocryl subcuticular stitch and Steri- Strips on the skin.  Well-padded sterile dressing was applied.  She was taken to the recovery room in stable condition.  All final counts were correct.  There were no complications noted.  Of note, Erskine Emery, PA- C assisted in the entire case.  His assistance was crucial for facilitating all aspects of this case.     Lind Guest. Ninfa Linden, M.D.     CYB/MEDQ  D:  01/12/2017  T:  01/12/2017  Job:  [id number]

## 2017-01-12 NOTE — H&P (Signed)
TOTAL KNEE ADMISSION H&P  Patient is being admitted for left total knee arthroplasty.  Subjective:  Chief Complaint:left knee pain.  HPI: Debbie Velasquez, 66 y.o. female, has a history of pain and functional disability in the left knee due to arthritis and has failed non-surgical conservative treatments for greater than 12 weeks to includeNSAID's and/or analgesics, corticosteriod injections, use of assistive devices, weight reduction as appropriate and activity modification.  Onset of symptoms was gradual, starting 3 years ago with gradually worsening course since that time. The patient noted no past surgery on the left knee(s).  Patient currently rates pain in the left knee(s) at 10 out of 10 with activity. Patient has night pain, worsening of pain with activity and weight bearing, pain that interferes with activities of daily living, pain with passive range of motion, crepitus and joint swelling.  Patient has evidence of subchondral sclerosis, periarticular osteophytes and joint space narrowing by imaging studies. There is no active infection.  Patient Active Problem List   Diagnosis Date Noted  . Unilateral primary osteoarthritis, left knee 01/12/2017  . Renal mass 12/22/2015   Past Medical History:  Diagnosis Date  . Arthritis   . Cancer ([place])    Left renal mass  . History of kidney stones    x3 -remains with one on right.   Marland Kitchen Hx of seasonal allergies    rare flare ups  . Hypertension   . Left renal mass   . Obesity   . Pneumonia    hx of 20 years ago  . Right ureteral stone   . Type 2 diabetes mellitus ([facility])    Type 2  . Wears glasses   . Wears partial dentures    upper    Past Surgical History:  Procedure Laterality Date  . CESAREAN SECTION  yrs ago  . ROBOTIC ASSITED PARTIAL NEPHRECTOMY Left 12/22/2015   Procedure: XI ROBOTIC ASSITED PARTIAL NEPHRECTOMY;  Surgeon: Alexis Frock, MD;  Location: [facility];  Service: Urology;  Laterality: Left;  . TOTAL ABDOMINAL  HYSTERECTOMY W/ BILATERAL SALPINGOOPHORECTOMY  1980's    Prescriptions Prior to Admission  Medication Sig Dispense Refill Last Dose  . acetaminophen (TYLENOL) 500 MG tablet Take 1,000 mg by mouth every 6 (six) hours as needed for mild pain or headache.   unk  . alum hydroxide-mag trisilicate (GAVISCON) 57-84 MG CHEW chewable tablet Chew 2 tablets by mouth 2 (two) times daily as needed for indigestion or heartburn.   unk  . aspirin EC 81 MG tablet Take 81 mg by mouth daily.     Marland Kitchen losartan-hydrochlorothiazide (HYZAAR) 100-12.5 MG tablet Take 1 tablet by mouth daily.   5 06/11/2016  . meloxicam (MOBIC) 7.5 MG tablet Take 7.5 mg by mouth daily as needed for pain.     . metFORMIN (GLUCOPHAGE) 500 MG tablet Take 500 mg by mouth 2 (two) times daily.    06/11/2016   No Known Allergies  Social History  Substance Use Topics  . Smoking status: Never Smoker  . Smokeless tobacco: Never Used  . Alcohol use No    Family History  Problem Relation Age of Onset  . Esophageal cancer Maternal Aunt   . Colon cancer Neg Hx   . Stomach cancer Neg Hx   . Rectal cancer Neg Hx      Review of Systems  Musculoskeletal: Positive for joint pain.  All other systems reviewed and are negative.   Objective:  Physical Exam  Constitutional: She is oriented to person, place,  and time. She appears well-developed and well-nourished.  HENT:  Head: Normocephalic and atraumatic.  Eyes: EOM are normal. Pupils are equal, round, and reactive to light.  Neck: Normal range of motion. Neck supple.  Cardiovascular: Normal rate and regular rhythm.   Respiratory: Effort normal and breath sounds normal.  GI: Soft. Bowel sounds are normal.  Musculoskeletal:       Left knee: She exhibits decreased range of motion, swelling and effusion. She exhibits normal alignment. Tenderness found. Medial joint line and lateral joint line tenderness noted.  Neurological: She is alert and oriented to person, place, and time.  Skin: Skin is  warm and dry.  Psychiatric: She has a normal mood and affect.    Vital signs in last 24 hours: Temp:  [98.1 F (36.7 C)] 98.1 F (36.7 C) (03/23 0725) Pulse Rate:  [84] 84 (03/23 0725) Resp:  [16] 16 (03/23 0725) BP: (170)/(85) 170/85 (03/23 0725) SpO2:  [100 %] 100 % (03/23 0725) Weight:  [253 lb (114.8 kg)] 253 lb (114.8 kg) (03/23 0725)  Labs:   Estimated body mass index is 43.43 kg/m as calculated from the following:   Height as of this encounter: 5\' 4"  (1.626 m).   Weight as of this encounter: 253 lb (114.8 kg).   Imaging Review Plain radiographs demonstrate severe degenerative joint disease of the left knee(s). The overall alignment ismild varus. The bone quality appears to be good for age and reported activity level.  Assessment/Plan:  End stage arthritis, left knee   The patient history, physical examination, clinical judgment of the provider and imaging studies are consistent with end stage degenerative joint disease of the left knee(s) and total knee arthroplasty is deemed medically necessary. The treatment options including medical management, injection therapy arthroscopy and arthroplasty were discussed at length. The risks and benefits of total knee arthroplasty were presented and reviewed. The risks due to aseptic loosening, infection, stiffness, patella tracking problems, thromboembolic complications and other imponderables were discussed. The patient acknowledged the explanation, agreed to proceed with the plan and consent was signed. Patient is being admitted for inpatient treatment for surgery, pain control, PT, OT, prophylactic antibiotics, VTE prophylaxis, progressive ambulation and ADL's and discharge planning. The patient is planning to be discharged home with home health services

## 2017-01-12 NOTE — Anesthesia Procedure Notes (Signed)
Spinal  Patient location during procedure: OR Start time: 01/12/2017 9:28 AM End time: 01/12/2017 9:33 AM Staffing Anesthesiologist: Suella Broad D Performed: anesthesiologist  Preanesthetic Checklist Completed: patient identified, site marked, surgical consent, pre-op evaluation, timeout performed, IV checked, risks and benefits discussed and monitors and equipment checked Spinal Block Patient position: sitting Prep: Betadine Patient monitoring: heart rate, continuous pulse ox, blood pressure and cardiac monitor Approach: midline Location: L4-5 Injection technique: single-shot Needle Needle type: Whitacre and Introducer  Needle gauge: 24 G Needle length: 9 cm Additional Notes Negative paresthesia. Negative blood return. Positive free-flowing CSF. Expiration date of kit checked and confirmed. Patient tolerated procedure well, without complications.

## 2017-01-13 LAB — CBC
HEMATOCRIT: 29.2 % — AB (ref 36.0–46.0)
HEMOGLOBIN: 9.3 g/dL — AB (ref 12.0–15.0)
MCH: 28 pg (ref 26.0–34.0)
MCHC: 31.8 g/dL (ref 30.0–36.0)
MCV: 88 fL (ref 78.0–100.0)
Platelets: 310 10*3/uL (ref 150–400)
RBC: 3.32 MIL/uL — ABNORMAL LOW (ref 3.87–5.11)
RDW: 13.4 % (ref 11.5–15.5)
WBC: 14.3 10*3/uL — ABNORMAL HIGH (ref 4.0–10.5)

## 2017-01-13 LAB — GLUCOSE, CAPILLARY
GLUCOSE-CAPILLARY: 98 mg/dL (ref 65–99)
GLUCOSE-CAPILLARY: 86 mg/dL (ref 65–99)
GLUCOSE-CAPILLARY: 87 mg/dL (ref 65–99)
GLUCOSE-CAPILLARY: 91 mg/dL (ref 65–99)

## 2017-01-13 NOTE — Evaluation (Signed)
Occupational Therapy Evaluation Patient Details Name: Debbie Velasquez MRN: [id number] DOB: May 29, 1951 Today's Date: 01/13/2017    History of Present Illness Pt s/p L TKR and R knee injection.   Clinical Impression   Pt admitted with above diagnosis and demonstrates the below listed deficits.  She requires mod - max A for LB ADLs and min A for ADLs.  She has limited support at discharge - recommend SNF level rehab to allow to to return home at mod I level.  All further OT needs can be addressed at SNF - acute OT will sign off at this time.     Follow Up Recommendations  SNF    Equipment Recommendations  None recommended by OT    Recommendations for Other Services       Precautions / Restrictions Precautions Precautions: Fall;Knee Required Braces or Orthoses: Knee Immobilizer - Left Knee Immobilizer - Left: Discontinue once straight leg raise with < 10 degree lag Restrictions Weight Bearing Restrictions: No Other Position/Activity Restrictions: WBAT      Mobility Bed Mobility Overal bed mobility: Needs Assistance Bed Mobility: Supine to Sit           General bed mobility comments: Pt sitting up in recliner   Transfers Overall transfer level: Needs assistance Equipment used: Rolling walker (2 wheeled) Transfers: Sit to/from Stand Sit to Stand: Min assist;From elevated surface         General transfer comment: assist to lift from chair and to steady     Balance                                           ADL either performed or assessed with clinical judgement   ADL Overall ADL's : Needs assistance/impaired Eating/Feeding: Independent;Sitting   Grooming: Wash/dry hands;Oral care;Wash/dry face;Brushing hair;Set up;Sitting   Upper Body Bathing: Set up;Sitting   Lower Body Bathing: Sit to/from stand;Moderate assistance   Upper Body Dressing : Set up;Sitting   Lower Body Dressing: Maximal assistance;Sit to/from stand   Toilet  Transfer: Minimal assistance;Ambulation;Comfort height toilet;BSC;Grab bars;RW   Toileting- Clothing Manipulation and Hygiene: Moderate assistance;Sit to/from stand       Functional mobility during ADLs: Minimal assistance;Rolling walker       Vision         Perception     Praxis      Pertinent Vitals/Pain Pain Assessment: 0-10 Pain Score: 5  Pain Location: L knee Pain Descriptors / Indicators: Aching;Sore Pain Intervention(s): Limited activity within patient's tolerance;Repositioned     Hand Dominance Right   Extremity/Trunk Assessment Upper Extremity Assessment Upper Extremity Assessment: Overall WFL for tasks assessed   Lower Extremity Assessment Lower Extremity Assessment: Defer to PT evaluation   Cervical / Trunk Assessment Cervical / Trunk Assessment: Normal   Communication Communication Communication: No difficulties   Cognition Arousal/Alertness: Awake/alert Behavior During Therapy: WFL for tasks assessed/performed Overall Cognitive Status: Within Functional Limits for tasks assessed                                     General Comments                Home Living Family/patient expects to be discharged to:: Skilled nursing facility Living Arrangements: Spouse/significant other;Children  Prior Functioning/Environment Level of Independence: Independent with assistive device(s)        Comments: used cane as needed        OT Problem List: Decreased activity tolerance;Decreased knowledge of use of DME or AE;Decreased knowledge of precautions;Obesity;Pain      OT Treatment/Interventions:      OT Goals(Current goals can be found in the care plan section) Acute Rehab OT Goals Patient Stated Goal: to regain independence  OT Goal Formulation: All assessment and education complete, DC therapy  OT Frequency:     Barriers to D/C:            Co-evaluation               End of Session Equipment Utilized During Treatment: Gait belt;Rolling walker;Left knee immobilizer CPM Left Knee CPM Left Knee: Off Nurse Communication: Mobility status  Activity Tolerance: Patient limited by pain Patient left: in chair;with call bell/phone within reach;with chair alarm set  OT Visit Diagnosis: Pain Pain - Right/Left: Left Pain - part of body: Knee                Time: 5183-3582 OT Time Calculation (min): 12 min Charges:  OT General Charges $OT Visit: 1 Procedure OT Evaluation $OT Eval Low Complexity: 1 Procedure G-Codes:     Lucille Passy, OTR/L [phone]   Debbie Velasquez, Debbie Velasquez 01/13/2017, 1:01 PM

## 2017-01-13 NOTE — NC FL2 (Signed)
[place] LEVEL OF CARE SCREENING TOOL     IDENTIFICATION  Patient Name: Debbie Velasquez Birthdate: 1951/04/08 Sex: female Admission Date (Current Location): 01/12/2017  [facility] and [place] Number:  Herbalist and Address:  [facility],  [address], [place]      Provider Number: [id number]  Attending Physician Name and Address:  Mcarthur Rossetti, *  Relative Name and Phone Number:       Current Level of Care: Hospital Recommended Level of Care: [place] Prior Approval Number:    Date Approved/Denied:   PASRR Number: [id number]  Discharge Plan: SNF    Current Diagnoses: Patient Active Problem List   Diagnosis Date Noted  . Unilateral primary osteoarthritis, left knee 01/12/2017  . 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/12/2017  . Renal mass 12/22/2015    Orientation RESPIRATION BLADDER Height & Weight     Self, Time, Situation, Place  O2 (2 L) Indwelling catheter Weight: 250 lb (113.4 kg) Height:  5\' 4"  (162.6 cm)  BEHAVIORAL SYMPTOMS/MOOD NEUROLOGICAL BOWEL NUTRITION STATUS      Continent Diet (Carb Modified)  AMBULATORY STATUS COMMUNICATION OF NEEDS Skin   Limited Assist Verbally Surgical wounds (Incision on right knee)                       Personal Care Assistance Level of Assistance  Feeding, Dressing, Bathing Bathing Assistance: Maximum assistance Feeding assistance: Independent Dressing Assistance: Limited assistance     Functional Limitations Info  Sight, Hearing, Speech Sight Info: Adequate Hearing Info: Adequate Speech Info: Adequate    SPECIAL CARE FACTORS FREQUENCY  PT (By licensed PT), OT (By licensed OT)                    Contractures Contractures Info: Not present    Additional Factors Info  Code Status, Allergies, Insulin Sliding Scale Code Status Info: FULL Allergies Info: NKDA           Current Medications (01/13/2017):  This is  the current hospital active medication list Current Facility-Administered Medications  Medication Dose Route Frequency Provider Last Rate Last Dose  . 0.9 %  sodium chloride infusion   Intravenous Continuous Mcarthur Rossetti, MD 75 mL/hr at 01/12/17 1807    . acetaminophen (TYLENOL) tablet 650 mg  650 mg Oral Q6H PRN Mcarthur Rossetti, MD       Or  . acetaminophen (TYLENOL) suppository 650 mg  650 mg Rectal Q6H PRN Mcarthur Rossetti, MD      . alum & mag hydroxide-simeth (MAALOX/MYLANTA) 200-200-20 MG/5ML suspension 30 mL  30 mL Oral Q4H PRN Mcarthur Rossetti, MD      . Alum Hydroxide-Mag Trisilicate 70-26.3 MG CHEW 2 tablet  2 tablet Oral BID PRN Mcarthur Rossetti, MD      . aspirin chewable tablet 81 mg  81 mg Oral BID Mcarthur Rossetti, MD   81 mg at 01/12/17 2138  . diphenhydrAMINE (BENADRYL) 12.5 MG/5ML elixir 12.5-25 mg  12.5-25 mg Oral Q4H PRN Mcarthur Rossetti, MD      . docusate sodium (COLACE) capsule 100 mg  100 mg Oral BID Mcarthur Rossetti, MD   100 mg at 01/12/17 2138  . losartan (COZAAR) tablet 100 mg  100 mg Oral Daily Mcarthur Rossetti, MD   100 mg at 01/12/17 1651   And  . hydrochlorothiazide (MICROZIDE) capsule 12.5 mg  12.5 mg Oral  Daily Mcarthur Rossetti, MD   12.5 mg at 01/12/17 1651  . HYDROmorphone (DILAUDID) injection 1 mg  1 mg Intravenous Q2H PRN Mcarthur Rossetti, MD      . insulin aspart (novoLOG) injection 0-15 Units  0-15 Units Subcutaneous TID WC Mcarthur Rossetti, MD   3 Units at 01/12/17 1802  . insulin aspart (novoLOG) injection 0-5 Units  0-5 Units Subcutaneous QHS Mcarthur Rossetti, MD      . menthol-cetylpyridinium (CEPACOL) lozenge 3 mg  1 lozenge Oral PRN Mcarthur Rossetti, MD       Or  . phenol (CHLORASEPTIC) mouth spray 1 spray  1 spray Mouth/Throat PRN Mcarthur Rossetti, MD      . metFORMIN (GLUCOPHAGE) tablet 500 mg  500 mg Oral BID WC Mcarthur Rossetti, MD   500 mg at  01/13/17 0856  . methocarbamol (ROBAXIN) tablet 500 mg  500 mg Oral Q6H PRN Mcarthur Rossetti, MD       Or  . methocarbamol (ROBAXIN) 500 mg in dextrose 5 % 50 mL IVPB  500 mg Intravenous Q6H PRN Mcarthur Rossetti, MD      . metoCLOPramide (REGLAN) tablet 5-10 mg  5-10 mg Oral Q8H PRN Mcarthur Rossetti, MD       Or  . metoCLOPramide (REGLAN) injection 5-10 mg  5-10 mg Intravenous Q8H PRN Mcarthur Rossetti, MD      . ondansetron [facility]) tablet 4 mg  4 mg Oral Q6H PRN Mcarthur Rossetti, MD       Or  . ondansetron [facility]) injection 4 mg  4 mg Intravenous Q6H PRN Mcarthur Rossetti, MD      . oxyCODONE (Oxy IR/ROXICODONE) immediate release tablet 5-10 mg  5-10 mg Oral Q3H PRN Mcarthur Rossetti, MD   10 mg at 01/13/17 0656  . polyethylene glycol (MIRALAX / GLYCOLAX) packet 17 g  17 g Oral Daily PRN Mcarthur Rossetti, MD         Discharge Medications: Please see discharge summary for a list of discharge medications.  Relevant Imaging Results:  Relevant Lab Results:   Additional Information    Boone Master, LCSW

## 2017-01-13 NOTE — Progress Notes (Signed)
qPhysical Therapy Treatment Patient Details Name: Debbie Velasquez MRN: [id number] DOB: [birth year] Today's Date: 01/13/2017    History of Present Illness Pt s/p L TKR and R knee injection.    PT Comments    Pt progressing well and eager for move to rehab setting.   Follow Up Recommendations  SNF     Equipment Recommendations  Rolling walker with 5" wheels    Recommendations for Other Services OT consult     Precautions / Restrictions Precautions Precautions: Fall;Knee Required Braces or Orthoses: Knee Immobilizer - Left Knee Immobilizer - Left: Discontinue once straight leg raise with < 10 degree lag Restrictions Weight Bearing Restrictions: No Other Position/Activity Restrictions: WBAT    Mobility  Bed Mobility Overal bed mobility: Needs Assistance Bed Mobility: Sit to Supine       Sit to supine: Min assist;Mod assist   General bed mobility comments: cues for sequence and use of R LE to self assist  Transfers Overall transfer level: Needs assistance Equipment used: Rolling walker (2 wheeled) Transfers: Sit to/from Stand Sit to Stand: Min assist;From elevated surface         General transfer comment: assist to lift from chair and to steady   Ambulation/Gait Ambulation/Gait assistance: Min assist Ambulation Distance (Feet): 60 Feet (60' twice and 15' from bathroom) Assistive device: Rolling walker (2 wheeled) Gait Pattern/deviations: Step-to pattern;Decreased step length - right;Decreased step length - left;Shuffle;Trunk flexed Gait velocity: decr Gait velocity interpretation: Below normal speed for age/gender General Gait Details: Increased time and cues for sequence, posture, position from [employer]            Wheelchair Mobility    Modified Rankin (Stroke Patients Only)       Balance                                            Cognition Arousal/Alertness: Awake/alert Behavior During Therapy: WFL for tasks  assessed/performed Overall Cognitive Status: Within Functional Limits for tasks assessed                                        Exercises Total Joint Exercises Ankle Circles/Pumps: AROM;Both;15 reps;Supine Quad Sets: AROM;Both;10 reps;Supine Heel Slides: AAROM;15 reps;Supine;Left Straight Leg Raises: AAROM;Left;10 reps;Supine    General Comments        Pertinent Vitals/Pain Pain Assessment: 0-10 Pain Score: 4  Pain Location: L knee Pain Descriptors / Indicators: Aching;Sore Pain Intervention(s): Limited activity within patient's tolerance;Monitored during session;Premedicated before session;Ice applied    Home Living Family/patient expects to be discharged to:: Skilled nursing facility Living Arrangements: Spouse/significant other;Children                  Prior Function Level of Independence: Independent with assistive device(s)      Comments: used cane as needed   PT Goals (current goals can now be found in the care plan section) Acute Rehab PT Goals Patient Stated Goal: to regain independence  PT Goal Formulation: With patient Time For Goal Achievement: 01/18/17 Potential to Achieve Goals: Good Progress towards PT goals: Progressing toward goals    Frequency    7X/week      PT Plan Current plan remains appropriate    Co-evaluation  End of Session Equipment Utilized During Treatment: Gait belt;Left knee immobilizer Activity Tolerance: Patient tolerated treatment well Patient left: in bed;with call bell/phone within reach   PT Visit Diagnosis: Unsteadiness on feet (R26.81)     Time: 1950-9326 PT Time Calculation (min) (ACUTE ONLY): 28 min  Charges:  $Gait Training: 23-37 mins $Therapeutic Exercise: 8-22 mins                    G Codes:       [id number]   Emery Dupuy 01/13/2017, 3:35 PM

## 2017-01-13 NOTE — Progress Notes (Signed)
qPhysical Therapy Treatment Patient Details Name: Debbie Velasquez MRN: [id number] DOB: 1951-09-24 Today's Date: 01/13/2017    History of Present Illness Pt s/p L TKR and R knee injection.    PT Comments    Pt motivated and making steady progress with mobility.   Follow Up Recommendations  SNF     Equipment Recommendations  Rolling walker with 5" wheels    Recommendations for Other Services OT consult     Precautions / Restrictions Precautions Precautions: Fall;Knee Required Braces or Orthoses: Knee Immobilizer - Left Knee Immobilizer - Left: Discontinue once straight leg raise with < 10 degree lag Restrictions Weight Bearing Restrictions: No Other Position/Activity Restrictions: WBAT    Mobility  Bed Mobility Overal bed mobility: Needs Assistance Bed Mobility: Supine to Sit           General bed mobility comments: Increased time with cues to self assist with R LE  Transfers Overall transfer level: Needs assistance Equipment used: Rolling walker (2 wheeled) Transfers: Sit to/from Stand Sit to Stand: Min assist;From elevated surface         General transfer comment: cues for LE management and use of UEs to self assist  Ambulation/Gait Ambulation/Gait assistance: Min assist Ambulation Distance (Feet): 59 Feet Assistive device: Rolling walker (2 wheeled) Gait Pattern/deviations: Step-to pattern;Decreased step length - right;Decreased step length - left;Shuffle;Trunk flexed Gait velocity: decr Gait velocity interpretation: Below normal speed for age/gender General Gait Details: Increased time and cues for sequence, posture, position from [employer]            Wheelchair Mobility    Modified Rankin (Stroke Patients Only)       Balance                                            Cognition Arousal/Alertness: Awake/alert Behavior During Therapy: WFL for tasks assessed/performed Overall Cognitive Status: Within  Functional Limits for tasks assessed                                        Exercises Total Joint Exercises Ankle Circles/Pumps: AROM;Both;15 reps;Supine Quad Sets: AROM;Both;10 reps;Supine Heel Slides: AAROM;15 reps;Supine;Left Straight Leg Raises: AAROM;Left;10 reps;Supine    General Comments        Pertinent Vitals/Pain Pain Assessment: 0-10 Pain Score: 5  Pain Location: L knee Pain Descriptors / Indicators: Aching;Sore Pain Intervention(s): Limited activity within patient's tolerance;Monitored during session;Premedicated before session;Ice applied    Home Living                      Prior Function            PT Goals (current goals can now be found in the care plan section) Acute Rehab PT Goals Patient Stated Goal: Regain IND PT Goal Formulation: With patient Time For Goal Achievement: 01/18/17 Potential to Achieve Goals: Good Progress towards PT goals: Progressing toward goals    Frequency    7X/week      PT Plan Current plan remains appropriate    Co-evaluation             End of Session Equipment Utilized During Treatment: Gait belt;Left knee immobilizer Activity Tolerance: Patient tolerated treatment well;Patient limited by fatigue Patient left: in chair;with call bell/phone within reach;with chair  alarm set   PT Visit Diagnosis: Unsteadiness on feet (R26.81)     Time: 9914-4458 PT Time Calculation (min) (ACUTE ONLY): 29 min  Charges:  $Gait Training: 8-22 mins $Therapeutic Exercise: 8-22 mins                    G Codes:       [id number]   Usher Hedberg 01/13/2017, 12:13 PM

## 2017-01-13 NOTE — Care Management Note (Signed)
Case Management Note  Patient Details  Name: Debbie Velasquez MRN: [id number] Date of Birth: July 07, 1951  Subjective/Objective:   Left TKA                 Action/Plan: Discharge Planning: Chart reviewed. Spoke to pt and plan is SNF rehab. CSW following for SNF placement. Pt will dc to [facility] once stable.   PCP Nolene Ebbs MD   Expected Discharge Date:                  Expected Discharge Plan:  [place]  In-House Referral:  Clinical Social Work  Discharge planning Services  CM Consult  Post Acute Care Choice:  NA Choice offered to:  NA  DME Arranged:  N/A DME Agency:  NA  HH Arranged:  NA HH Agency:  NA  Status of Service:  Completed, signed off  If discussed at [place] of Stay Meetings, dates discussed:    Additional Comments:  Erenest Rasher, RN 01/13/2017, 11:27 AM

## 2017-01-13 NOTE — Progress Notes (Signed)
Subjective: 1 Day Post-Op Procedure(s) (LRB): LEFT TOTAL KNEE ARTHROPLASTY and Right knee steroid injection (Left) Patient reports pain as moderate.  Acute blood loos anemia from surgery, but tolerating well thus far.  Objective: Vital signs in last 24 hours: Temp:  [97.3 F (36.3 C)-98.3 F (36.8 C)] 97.8 F (36.6 C) (03/24 0555) Pulse Rate:  [67-80] 74 (03/24 0555) Resp:  [12-20] 16 (03/24 0555) BP: (122-145)/(45-88) 136/63 (03/24 0555) SpO2:  [96 %-100 %] 100 % (03/24 0555) Weight:  [250 lb (113.4 kg)] 250 lb (113.4 kg) (03/23 1245)  Intake/Output from previous day: 03/23 0701 - 03/24 0700 In: 4462.5 [P.O.:1020; I.V.:3392.5; IV Piggyback:50] Out: 2045 [Urine:1995; Blood:50] Intake/Output this shift: Total I/O In: -  Out: 100 [Urine:100]   Recent Labs  01/13/17 0411  HGB 9.3*    Recent Labs  01/13/17 0411  WBC 14.3*  RBC 3.32*  HCT 29.2*  PLT 310   No results for input(s): NA, K, CL, CO2, BUN, CREATININE, GLUCOSE, CALCIUM in the last 72 hours. No results for input(s): LABPT, INR in the last 72 hours.  Sensation intact distally Intact pulses distally Dorsiflexion/Plantar flexion intact Incision: dressing C/D/I Compartment soft  Assessment/Plan: 1 Day Post-Op Procedure(s) (LRB): LEFT TOTAL KNEE ARTHROPLASTY and Right knee steroid injection (Left) Up with therapy Discharge to [facility] Monday.  Mcarthur Rossetti 01/13/2017, 9:41 AM

## 2017-01-13 NOTE — Clinical Social Work Note (Signed)
Clinical Social Work Assessment  Patient Details  Name: Debbie Velasquez MRN: [id number] Date of Birth: 09/11/1951  Date of referral:  01/13/17               Reason for consult:  Facility Placement                Permission sought to share information with:  Family Supports Permission granted to share information::  Yes, Verbal Permission Granted  Name::     Larry  Agency::     Relationship::  spouse  Contact Information:     Housing/Transportation Living arrangements for the past 2 months:  Single Family Home Source of Information:  Patient Patient Interpreter Needed:  None Criminal Activity/Legal Involvement Pertinent to Current Situation/Hospitalization:  No - Comment as needed Significant Relationships:  Spouse Lives with:  Spouse Do you feel safe going back to the place where you live?  Yes Need for family participation in patient care:  No (Coment)  Care giving concerns:  PT recommends SNF placement.    Social Worker assessment / plan:  MD placed consult for SNF placement. Per chart review, PT recommends SNF placement.  CSW reviewed chart and met with pt at bedside. Pt reports that surgery was planned and she already signed admission paperwork at [facility].  CSW completed FL2 and faxed information to SNF. CSW left a message with [facility] admissions reporting planned DC for Monday and requested a return call to ensure they could accept patient.  Employment status:  Retired Insurance information:  Managed Medicare PT Recommendations:  Skilled Nursing Facility Information / Referral to community resources:  Skilled Nursing Facility  Patient/Family's Response to care:  Pt feels prepared for DC.  Patient/Family's Understanding of and Emotional Response to Diagnosis, Current Treatment, and Prognosis:  Pt states she feels well and prepared for transfer to [facility] once she is medically stable.  Emotional Assessment Appearance:  Appears stated age,  Well-Groomed Attitude/Demeanor/Rapport:  Other (Appropriate) Affect (typically observed):  Accepting Orientation:  Oriented to Self, Oriented to Place, Oriented to  Time, Oriented to Situation Alcohol / Substance use:  Never Used Psych involvement (Current and /or in the community):  No (Comment)  Discharge Needs  Concerns to be addressed:  No discharge needs identified Readmission within the last 30 days:  No Current discharge risk:  None Barriers to Discharge:  No Barriers Identified   Gerber, Holly Marie, LCSW 01/13/2017, 9:58 AM Weekend Coverage  

## 2017-01-13 NOTE — Clinical Social Work Placement (Signed)
   CLINICAL SOCIAL WORK PLACEMENT  NOTE  Date:  01/13/2017  Patient Details  Name: Debbie Velasquez MRN: [id number] Date of Birth: 1950/12/10  Clinical Social Work is seeking post-discharge placement for this patient at the [place] level of care (*CSW will initial, date and re-position this form in  chart as items are completed):  Yes   Patient/family provided with [place] Work Department's list of facilities offering this level of care within the geographic area requested by the patient (or if unable, by the patient's family).  Yes   Patient/family informed of their freedom to choose among providers that offer the needed level of care, that participate in Medicare, Medicaid or managed care program needed by the patient, have an available bed and are willing to accept the patient.  Yes   Patient/family informed of [place]'s ownership interest in [facility] and [facility], as well as of the fact that they are under no obligation to receive care at these facilities.  PASRR submitted to EDS on 01/13/17     PASRR number received on 01/13/17     Existing PASRR number confirmed on       FL2 transmitted to all facilities in geographic area requested by pt/family on 01/13/17     FL2 transmitted to all facilities within larger geographic area on       Patient informed that his/her managed care company has contracts with or will negotiate with certain facilities, including the following:            Patient/family informed of bed offers received.  Patient [facility] bed at [facility]     Physician recommends and patient chooses bed at      Patient to be transferred to   on  .  Patient to be transferred to facility by       Patient family notified on   of transfer.  Name of family member notified:        PHYSICIAN       Additional Comment:    _______________________________________________ Boone Master,  [place] 01/13/2017, 10:00 AM

## 2017-01-14 LAB — BASIC METABOLIC PANEL
Anion gap: 7 (ref 5–15)
BUN: 21 mg/dL — AB (ref 6–20)
CO2: 28 mmol/L (ref 22–32)
Calcium: 9 mg/dL (ref 8.9–10.3)
Chloride: 102 mmol/L (ref 101–111)
Creatinine, Ser: 0.94 mg/dL (ref 0.44–1.00)
Glucose, Bld: 129 mg/dL — ABNORMAL HIGH (ref 65–99)
POTASSIUM: 3.9 mmol/L (ref 3.5–5.1)
SODIUM: 137 mmol/L (ref 135–145)

## 2017-01-14 LAB — CBC
HCT: 28.3 % — ABNORMAL LOW (ref 36.0–46.0)
Hemoglobin: 9 g/dL — ABNORMAL LOW (ref 12.0–15.0)
MCH: 28 pg (ref 26.0–34.0)
MCHC: 31.8 g/dL (ref 30.0–36.0)
MCV: 87.9 fL (ref 78.0–100.0)
PLATELETS: 356 10*3/uL (ref 150–400)
RBC: 3.22 MIL/uL — AB (ref 3.87–5.11)
RDW: 13.5 % (ref 11.5–15.5)
WBC: 13.8 10*3/uL — AB (ref 4.0–10.5)

## 2017-01-14 LAB — GLUCOSE, CAPILLARY
GLUCOSE-CAPILLARY: 106 mg/dL — AB (ref 65–99)
GLUCOSE-CAPILLARY: 86 mg/dL (ref 65–99)
GLUCOSE-CAPILLARY: 81 mg/dL (ref 65–99)
GLUCOSE-CAPILLARY: 80 mg/dL (ref 65–99)
Glucose-Capillary: 69 mg/dL (ref 65–99)

## 2017-01-14 NOTE — Progress Notes (Signed)
Dr Durward Fortes returned page & notified of above. Acknowledged info & no new orders. Sharna Gabrys, [employer]

## 2017-01-14 NOTE — Progress Notes (Signed)
qPhysical Therapy Treatment Patient Details Name: Debbie Velasquez MRN: [id number] DOB: [birth year] Today's Date: 01/14/2017    History of Present Illness Pt s/p L TKR and R knee injection.    PT Comments    Pt progressing well, continue to recommend SNF therapies post acute  Follow Up Recommendations  SNF     Equipment Recommendations  Rolling walker with 5" wheels    Recommendations for Other Services       Precautions / Restrictions Precautions Precautions: Fall;Knee Required Braces or Orthoses: Knee Immobilizer - Left Knee Immobilizer - Left: Discontinue once straight leg raise with < 10 degree lag Restrictions Weight Bearing Restrictions: No Other Position/Activity Restrictions: WBAT    Mobility  Bed Mobility Overal bed mobility: Needs Assistance Bed Mobility: Supine to Sit     Supine to sit: Min assist;HOB elevated     General bed mobility comments: cues for sequence and use of R LE to self assist  Transfers Overall transfer level: Needs assistance Equipment used: Rolling walker (2 wheeled) Transfers: Sit to/from Stand Sit to Stand: Min assist;From elevated surface         General transfer comment: cues for LE management and use of UEs to self assist  Ambulation/Gait Ambulation/Gait assistance: Min guard Ambulation Distance (Feet): 60 Feet Assistive device: Rolling walker (2 wheeled) Gait Pattern/deviations: Step-to pattern;Decreased step length - right;Decreased step length - left;Shuffle;Trunk flexed     General Gait Details: Increased time and cues for sequence, posture, position from [employer]            Wheelchair Mobility    Modified Rankin (Stroke Patients Only)       Balance                                            Cognition Arousal/Alertness: Awake/alert Behavior During Therapy: WFL for tasks assessed/performed Overall Cognitive Status: Within Functional Limits for tasks assessed                                        Exercises Total Joint Exercises Ankle Circles/Pumps: AROM;Both;15 reps;Supine Quad Sets: AROM;Both;10 reps;Supine Heel Slides: AAROM;Left;10 reps Hip ABduction/ADduction: AAROM;Left;10 reps Straight Leg Raises: AAROM;Left;10 reps;Supine    General Comments        Pertinent Vitals/Pain Pain Assessment: 0-10 Pain Score: 4  Pain Location: L knee Pain Descriptors / Indicators: Aching;Sore Pain Intervention(s): Limited activity within patient's tolerance;Monitored during session;Premedicated before session;Ice applied    Home Living                      Prior Function            PT Goals (current goals can now be found in the care plan section) Acute Rehab PT Goals Patient Stated Goal: to regain independence  PT Goal Formulation: With patient Time For Goal Achievement: 01/18/17 Potential to Achieve Goals: Good Progress towards PT goals: Progressing toward goals    Frequency    7X/week      PT Plan Current plan remains appropriate    Co-evaluation             End of Session Equipment Utilized During Treatment: Gait belt;Left knee immobilizer Activity Tolerance: Patient tolerated treatment well Patient left: in chair;with call bell/phone within reach;with  chair alarm set Nurse Communication: Mobility status PT Visit Diagnosis: Unsteadiness on feet (R26.81)     Time: 1040-1101 PT Time Calculation (min) (ACUTE ONLY): 21 min  Charges:  $Gait Training: 8-22 mins                    G Codes:          Estephany Perot 01/30/17, 1:03 PM

## 2017-01-14 NOTE — Progress Notes (Signed)
Subjective: 2 Days Post-Op Procedure(s) (LRB): LEFT TOTAL KNEE ARTHROPLASTY and Right knee steroid injection (Left) Patient reports pain as mild and moderate.    Objective: Vital signs in last 24 hours: Temp:  [97.5 F (36.4 C)-98.4 F (36.9 C)] 98.4 F (36.9 C) (03/25 0529) Pulse Rate:  [78-83] 82 (03/25 0529) Resp:  [16] 16 (03/25 0529) BP: (128-147)/(56-67) 128/56 (03/25 0529) SpO2:  [99 %-100 %] 99 % (03/25 0529)  Intake/Output from previous day: 03/24 0701 - 03/25 0700 In: 1451.3 [P.O.:1260; I.V.:191.3] Out: 1140 [Urine:1140] Intake/Output this shift: Total I/O In: 240 [P.O.:240] Out: -    Recent Labs  01/13/17 0411 01/14/17 0421  HGB 9.3* 9.0*    Recent Labs  01/13/17 0411 01/14/17 0421  WBC 14.3* 13.8*  RBC 3.32* 3.22*  HCT 29.2* 28.3*  PLT 310 356    Recent Labs  01/14/17 0421  NA 137  K 3.9  CL 102  CO2 28  BUN 21*  CREATININE 0.94  GLUCOSE 129*  CALCIUM 9.0   No results for input(s): LABPT, INR in the last 72 hours.  Neurovascular intact Sensation intact distally Dorsiflexion/Plantar flexion intact Incision: dressing C/D/I and no drainage Mepilex placed  Assessment/Plan: 2 Days Post-Op Procedure(s) (LRB): LEFT TOTAL KNEE ARTHROPLASTY and Right knee steroid injection (Left)  Dressing changed. Up with therapy Discharge to SNF  When bed available Biagio Borg 01/14/2017, 10:28 AM

## 2017-01-14 NOTE — Progress Notes (Signed)
Pt reports she routinely takes only one metformin per day. Skii Cleland, [employer]

## 2017-01-14 NOTE — Progress Notes (Signed)
Pt had hypoglycemic event with cbg = 69. Food tray had just been delivered & orange juice added. Subsequent cbg = 81. On call paged for notification. Shaynah Hund, [employer]

## 2017-01-15 LAB — GLUCOSE, CAPILLARY
GLUCOSE-CAPILLARY: 107 mg/dL — AB (ref 65–99)
Glucose-Capillary: 114 mg/dL — ABNORMAL HIGH (ref 65–99)

## 2017-01-15 MED ORDER — OXYCODONE-ACETAMINOPHEN 5-325 MG PO TABS: 1.0000 | ORAL_TABLET | ORAL | 0 refills | Status: DC | PRN

## 2017-01-15 MED ORDER — ASPIRIN 81 MG PO CHEW: 81.0000 mg | CHEWABLE_TABLET | Freq: Two times a day (BID) | ORAL | 0 refills | Status: DC

## 2017-01-15 MED ORDER — METHOCARBAMOL 500 MG PO TABS: 500.0000 mg | ORAL_TABLET | Freq: Four times a day (QID) | ORAL | 0 refills | Status: DC | PRN

## 2017-01-15 NOTE — Progress Notes (Signed)
qPhysical Therapy Treatment Patient Details Name: Debbie Velasquez MRN: [id number] DOB: 03/02/1951 Today's Date: 01/15/2017    History of Present Illness Pt s/p L TKR and R knee injection.    PT Comments    POD # 3 am session Applied KI and instructed on use.  Assisted with amb a limited distance then returned to room to perform some TKR TE's followed by ICE.   Follow Up Recommendations  SNF     Equipment Recommendations       Recommendations for Other Services       Precautions / Restrictions Precautions Precautions: Fall;Knee Precaution Comments: instructed on KI use for amb Required Braces or Orthoses: Knee Immobilizer - Left Knee Immobilizer - Left: Discontinue once straight leg raise with < 10 degree lag Restrictions Weight Bearing Restrictions: No Other Position/Activity Restrictions: WBAT    Mobility  Bed Mobility               General bed mobility comments: OOB in recliner  Transfers Overall transfer level: Needs assistance Equipment used: Rolling walker (2 wheeled) Transfers: Sit to/from Stand Sit to Stand: Min assist;From elevated surface         General transfer comment: cues for LE management and use of UEs to self assist  Ambulation/Gait Ambulation/Gait assistance: Min guard Ambulation Distance (Feet): 54 Feet Assistive device: Rolling walker (2 wheeled) Gait Pattern/deviations: Step-to pattern;Decreased step length - right;Decreased step length - left;Shuffle;Trunk flexed Gait velocity: decr   General Gait Details: Increased time and cues for sequence, posture, position from [employer]            Wheelchair Mobility    Modified Rankin (Stroke Patients Only)       Balance                                            Cognition Arousal/Alertness: Awake/alert Behavior During Therapy: WFL for tasks assessed/performed Overall Cognitive Status: Within Functional Limits for tasks assessed                                         Exercises   Total Knee Replacement TE's  10 reps knee presses 10 reps heel slides  10 reps SLR's 10 reps ABD Followed by ICE     General Comments        Pertinent Vitals/Pain Pain Assessment: 0-10 Pain Score: 2  Pain Location: L knee Pain Descriptors / Indicators: Aching;Sore Pain Intervention(s): Monitored during session;Ice applied;Repositioned    Home Living                      Prior Function            PT Goals (current goals can now be found in the care plan section) Progress towards PT goals: Progressing toward goals    Frequency    7X/week      PT Plan Current plan remains appropriate    Co-evaluation             End of Session Equipment Utilized During Treatment: Gait belt;Left knee immobilizer Activity Tolerance: Patient tolerated treatment well Patient left: in chair;with call bell/phone within reach;with chair alarm set Nurse Communication: Mobility status PT Visit Diagnosis: Unsteadiness on feet (R26.81)     Time: 8469-6295  PT Time Calculation (min) (ACUTE ONLY): 26 min  Charges:  $Gait Training: 8-22 mins $Therapeutic Exercise: 8-22 mins                    G Codes:         Rica Koyanagi  PTA [facility]  Acute  Rehab Pager      [phone]

## 2017-01-15 NOTE — Progress Notes (Signed)
Patient ID: Debbie Velasquez, female   DOB: 03/06/1951, 66 y.o.   MRN: [id number] Doing well overall.  Can be discharged to skilled nursing today.

## 2017-01-15 NOTE — Progress Notes (Signed)
AVS reviewed with patient.  RN called report to PK at [facility]. All questions answered.   Packet (including prescriptions) sent with patient.

## 2017-01-15 NOTE — Clinical Social Work Placement (Signed)
   CLINICAL SOCIAL WORK PLACEMENT  NOTE  Date:  01/15/2017  Patient Details  Name: CHRISTIN MOLINE MRN: [id number] Date of Birth: 06-Mar-1951  Clinical Social Work is seeking post-discharge placement for this patient at the [employer] level of care (*CSW will initial, date and re-position this form in  chart as items are completed):  Yes   Patient/family provided with [facility] Work Department's list of facilities offering this level of care within the geographic area requested by the patient (or if unable, by the patient's family).  Yes   Patient/family informed of their freedom to choose among providers that offer the needed level of care, that participate in Medicare, Medicaid or managed care program needed by the patient, have an available bed and are willing to accept the patient.  Yes   Patient/family informed of [facility]'s ownership interest in [facility] and [facility], as well as of the fact that they are under no obligation to receive care at these facilities.  PASRR submitted to EDS on 01/13/17     PASRR number received on 01/13/17     Existing PASRR number confirmed on       FL2 transmitted to all facilities in geographic area requested by pt/family on 01/13/17     FL2 transmitted to all facilities within larger geographic area on       Patient informed that his/her managed care company has contracts with or will negotiate with certain facilities, including the following:            Patient/family informed of bed offers received.  Patient chooses bed at [facility]     Physician recommends and patient chooses bed at      Patient to be transferred to [facility] on 01/15/17.  Patient to be transferred to facility by [facility]     Patient family notified on 01/15/17 of transfer.  Name of family member notified:  Pt contacted family directly.     PHYSICIAN       Additional Comment: Pt is in agreement with d/c to  [facility] today. [facility] transport required. Medical necessity form completed. Pt is aware out of pocket costs may be associated with [facility] transport. D/C Summary sent to SNF for review. Scripts included in d/c packet. # for report provided to nsg.   _______________________________________________ Luretha Rued, [place] 01/15/2017, 3:40 PM

## 2017-01-15 NOTE — Discharge Instructions (Signed)

## 2017-01-15 NOTE — Discharge Summary (Signed)
Patient ID: Debbie Velasquez MRN: [id number] DOB/AGE: 04-09-1951 66 y.o.  Admit date: 01/12/2017 Discharge date: 01/15/2017  Admission Diagnoses:  Principal Problem:   Unilateral primary osteoarthritis, left knee Active Problems:   Status post total left knee replacement   Discharge Diagnoses:  Same  Past Medical History:  Diagnosis Date  . Arthritis   . Cancer ([place])    Left renal mass  . History of kidney stones    x3 -remains with one on right.   Marland Kitchen Hx of seasonal allergies    rare flare ups  . Hypertension   . Left renal mass   . Obesity   . Pneumonia    hx of 20 years ago  . Right ureteral stone   . Type 2 diabetes mellitus (HCC)    Type 2  . Wears glasses   . Wears partial dentures    upper    Surgeries: Procedure(s): LEFT TOTAL KNEE ARTHROPLASTY and Right knee steroid injection on 01/12/2017   Consultants:   Discharged Condition: Improved  Hospital Course: Debbie Velasquez is an 66 y.o. female who was admitted 01/12/2017 for operative treatment ofUnilateral primary osteoarthritis, left knee. Patient has severe unremitting pain that affects sleep, daily activities, and work/hobbies. After pre-op clearance the patient was taken to the operating room on 01/12/2017 and underwent  Procedure(s): LEFT TOTAL KNEE ARTHROPLASTY and Right knee steroid injection.    Patient was given perioperative antibiotics: Anti-infectives    Start     Dose/Rate Route Frequency Ordered Stop   01/12/17 1600  ceFAZolin (ANCEF) IVPB 1 g/50 mL premix     1 g 100 mL/hr over 30 Minutes Intravenous Every 6 hours 01/12/17 1258 01/12/17 2208   01/12/17 0600  ceFAZolin (ANCEF) 3 g in dextrose 5 % 50 mL IVPB     3 g 130 mL/hr over 30 Minutes Intravenous On call to O.R. 01/11/17 1315 01/12/17 0926       Patient was given sequential compression devices, early ambulation, and chemoprophylaxis to prevent DVT.  Patient benefited maximally from hospital stay and there were no  complications.    Recent vital signs: Patient Vitals for the past 24 hrs:  BP Temp Temp src Pulse Resp SpO2  01/15/17 0520 140/61 98.2 F (36.8 C) Oral 83 20 98 %  01/14/17 2132 (!) 156/66 99.7 F (37.6 C) Oral 95 16 98 %  01/14/17 1354 (!) 125/57 - - 93 16 97 %     Recent laboratory studies:  Recent Labs  01/13/17 0411 01/14/17 0421  WBC 14.3* 13.8*  HGB 9.3* 9.0*  HCT 29.2* 28.3*  PLT 310 356  NA  --  137  K  --  3.9  CL  --  102  CO2  --  28  BUN  --  21*  CREATININE  --  0.94  GLUCOSE  --  129*  CALCIUM  --  9.0     Discharge Medications:   Allergies as of 01/15/2017   No Known Allergies     Medication List    STOP taking these medications   aspirin EC 81 MG tablet Replaced by:  aspirin 81 MG chewable tablet     TAKE these medications   acetaminophen 500 MG tablet Commonly known as:  TYLENOL Take 1,000 mg by mouth every 6 (six) hours as needed for mild pain or headache.   alum hydroxide-mag trisilicate 99-24 MG Chew chewable tablet Commonly known as:  GAVISCON Chew 2 tablets by mouth 2 (two) times daily  as needed for indigestion or heartburn.   aspirin 81 MG chewable tablet Chew 1 tablet (81 mg total) by mouth 2 (two) times daily. Replaces:  aspirin EC 81 MG tablet   losartan-hydrochlorothiazide 100-12.5 MG tablet Commonly known as:  HYZAAR Take 1 tablet by mouth daily.   meloxicam 7.5 MG tablet Commonly known as:  MOBIC Take 7.5 mg by mouth daily as needed for pain.   metFORMIN 500 MG tablet Commonly known as:  GLUCOPHAGE Take 500 mg by mouth 2 (two) times daily.   methocarbamol 500 MG tablet Commonly known as:  ROBAXIN Take 1 tablet (500 mg total) by mouth every 6 (six) hours as needed for muscle spasms.   oxyCODONE-acetaminophen 5-325 MG tablet Commonly known as:  ROXICET Take 1-2 tablets by mouth every 4 (four) hours as needed.            Durable Medical Equipment        Start     Ordered   01/12/17 1259  DME Walker rolling   Once    Question:  Patient needs a walker to treat with the following condition  Answer:  Status post total left knee replacement   01/12/17 1258   01/12/17 1259  DME 3 n 1  Once     01/12/17 1258      Diagnostic Studies: Dg Knee Left Port  Result Date: 01/12/2017 CLINICAL DATA:  Status post op left side total knee replacement. Hx of joint disease. Imaging done in [facility] PACU. EXAM: PORTABLE LEFT KNEE - 1-2 VIEW COMPARISON:  05/08/2015 FINDINGS: Components of left knee arthroplasty project in expected location. No evidence of fracture or dislocation. IMPRESSION: 1. Expected appearance post knee arthroplasty. Critical Value/emergent results were called by telephone at the time of interpretation on 01/12/2017 at 12:01 pm to [employer] in the [place], who verbally acknowledged these results. Electronically Signed   By: Lucrezia Europe M.D.   On: 01/12/2017 12:02    Disposition: to skilled nursing facility  Discharge Instructions    Discharge patient    Complete by:  As directed    Discharge disposition:  03-Skilled [place]   Discharge patient date:  01/15/2017      Follow-up Information    Mcarthur Rossetti, MD Follow up in 2 week(s).   Specialty:  Orthopedic Surgery Contact information: [place] [address] [phone]            Signed: Mcarthur Rossetti 01/15/2017, 7:33 AM

## 2017-01-16 ENCOUNTER — Telehealth (INDEPENDENT_AMBULATORY_CARE_PROVIDER_SITE_OTHER): Payer: Self-pay | Admitting: Radiology

## 2017-01-16 NOTE — Telephone Encounter (Signed)
Jamillah from [facility] is calling to get orders for CMP machine and the procotol settings that Dr. Ninfa Linden wants them to use for this patient.  The order can be faxed to [phone] or you can call her back at [phone]

## 2017-01-16 NOTE — Telephone Encounter (Signed)
Written and faxed to provided number

## 2017-01-16 NOTE — Telephone Encounter (Signed)
0 to 90 degrees for 2 hours daily

## 2017-01-16 NOTE — Telephone Encounter (Signed)
Please advise 

## 2017-01-18 ENCOUNTER — Telehealth (INDEPENDENT_AMBULATORY_CARE_PROVIDER_SITE_OTHER): Payer: Self-pay

## 2017-01-18 NOTE — Telephone Encounter (Signed)
Debbie Velasquez needs order for CPM machine to be refaxed to her. Fax number below.  Fax [phone]

## 2017-01-23 NOTE — Telephone Encounter (Signed)
Re-faxed.

## 2017-01-26 ENCOUNTER — Ambulatory Visit (INDEPENDENT_AMBULATORY_CARE_PROVIDER_SITE_OTHER): Payer: Medicare Other | Admitting: Physician Assistant

## 2017-01-26 DIAGNOSIS — Z96652 Presence of left artificial knee joint: Secondary | ICD-10-CM

## 2017-01-26 NOTE — Progress Notes (Signed)
   Post-Op Visit Note   Patient: Debbie Velasquez           Date of Birth: 12-13-1950           MRN: [id number] Visit Date: 01/26/2017 PCP: Philis Fendt, MD   Assessment & Plan: Plan:Start outpatient physical therapy prescription is given for [facility] therapy. Scar tissue mobilization encouraged. Follow-up in one month New Steri-Strips applied Chief Complaint: No chief complaint on file.  Visit Diagnoses:  1. Status post total left knee replacement      HPI: 2 weeks status post left total knee arthroplasty. She's skilled facility at this point time due to be discharged home. She reports the physical therapy stated that she did not need home therapy. Therefore will send her to outpatient therapy. No shortness breath fevers chills Pain.  Physical exam:  Calf supple nontender she has full extension of the knee. Flexion to 90 easily. No instability. Surgical incisions healing well with a running subcutaneous stitch no signs of infection  Follow-Up Instructions: Return in about 4 weeks (around 02/23/2017).   Orders:  No orders of the defined types were placed in this encounter.  No orders of the defined types were placed in this encounter.   Imaging: No results found.  PMFS History: Patient Active Problem List   Diagnosis Date Noted  . Unilateral primary osteoarthritis, left knee 01/12/2017  . 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/12/2017  . Renal mass 12/22/2015   Past Medical History:  Diagnosis Date  . Arthritis   . Cancer ([place])    Left renal mass  . History of kidney stones    x3 -remains with one on right.   Marland Kitchen Hx of seasonal allergies    rare flare ups  . Hypertension   . Left renal mass   . Obesity   . Pneumonia    hx of 20 years ago  . Right ureteral stone   . Type 2 diabetes mellitus (HCC)    Type 2  . Wears glasses   . Wears partial dentures    upper    Family History  Problem Relation Age of Onset  . Esophageal cancer Maternal Aunt     . Colon cancer Neg Hx   . Stomach cancer Neg Hx   . Rectal cancer Neg Hx     Past Surgical History:  Procedure Laterality Date  . CESAREAN SECTION  yrs ago  . ROBOTIC ASSITED PARTIAL NEPHRECTOMY Left 12/22/2015   Procedure: XI ROBOTIC ASSITED PARTIAL NEPHRECTOMY;  Surgeon: Alexis Frock, MD;  Location: [facility];  Service: Urology;  Laterality: Left;  . TOTAL ABDOMINAL HYSTERECTOMY W/ BILATERAL SALPINGOOPHORECTOMY  1980's  . TOTAL KNEE ARTHROPLASTY Left 01/12/2017   Procedure: LEFT TOTAL KNEE ARTHROPLASTY and Right knee steroid injection;  Surgeon: Mcarthur Rossetti, MD;  Location: [facility];  Service: Orthopedics;  Laterality: Left;   Social History   Occupational History  . Not on file.   Social History Main Topics  . Smoking status: Never Smoker  . Smokeless tobacco: Never Used  . Alcohol use No  . Drug use: No  . Sexual activity: Not Currently

## 2017-01-29 ENCOUNTER — Telehealth (INDEPENDENT_AMBULATORY_CARE_PROVIDER_SITE_OTHER): Payer: Self-pay | Admitting: Orthopaedic Surgery

## 2017-01-29 MED ORDER — OXYCODONE-ACETAMINOPHEN 5-325 MG PO TABS: 1.0000 | ORAL_TABLET | Freq: Three times a day (TID) | ORAL | 0 refills | Status: DC | PRN

## 2017-01-29 NOTE — Telephone Encounter (Signed)
Can come and pick up script 

## 2017-01-29 NOTE — Telephone Encounter (Signed)
Please advise 

## 2017-01-29 NOTE — Telephone Encounter (Signed)
Patient called needing Oxycodone for her therapy. Also wanted to let you know that she won't be starting therapy until next week. CB # [phone]

## 2017-01-29 NOTE — Telephone Encounter (Signed)
Patient aware this is at the front desk

## 2017-01-30 ENCOUNTER — Telehealth (INDEPENDENT_AMBULATORY_CARE_PROVIDER_SITE_OTHER): Payer: Self-pay | Admitting: *Deleted

## 2017-01-30 NOTE — Telephone Encounter (Signed)
Does she need this asap? Or is next week fine?

## 2017-01-30 NOTE — Telephone Encounter (Signed)
Patient called in this afternoon in regards to her referral o [facility]. She was not able to get an appointment until next week and she is needing to be seen as soon as possible and she didn't know if we could possibly do anything for her to be seen sooner ? Her CB # [phone]. Thank you

## 2017-01-31 ENCOUNTER — Telehealth (INDEPENDENT_AMBULATORY_CARE_PROVIDER_SITE_OTHER): Payer: Self-pay | Admitting: Orthopaedic Surgery

## 2017-01-31 NOTE — Telephone Encounter (Signed)
[place] with [employer] called advised the patient was discharged from rehab 01/27/17 and have not been set up for (PT)  She asked that the patient be contacted. The number to contact patient is [phone]. The number to contact Edwena Felty is [phone]

## 2017-02-01 NOTE — Telephone Encounter (Signed)
Sent message to [employer]

## 2017-02-02 NOTE — Telephone Encounter (Signed)
Ok next week just do exercises as taught by PT so far

## 2017-02-02 NOTE — Telephone Encounter (Signed)
PATIENT WENT TO PT THIS MORNING

## 2017-02-21 ENCOUNTER — Other Ambulatory Visit: Payer: Self-pay | Admitting: Internal Medicine

## 2017-02-21 DIAGNOSIS — Z1231 Encounter for screening mammogram for malignant neoplasm of breast: Secondary | ICD-10-CM

## 2017-02-21 DIAGNOSIS — E2839 Other primary ovarian failure: Secondary | ICD-10-CM

## 2017-02-26 ENCOUNTER — Encounter (INDEPENDENT_AMBULATORY_CARE_PROVIDER_SITE_OTHER): Payer: Self-pay | Admitting: Physician Assistant

## 2017-02-26 ENCOUNTER — Ambulatory Visit (INDEPENDENT_AMBULATORY_CARE_PROVIDER_SITE_OTHER): Payer: Medicare Other | Admitting: Physician Assistant

## 2017-02-26 DIAGNOSIS — Z96652 Presence of left artificial knee joint: Secondary | ICD-10-CM

## 2017-02-26 NOTE — Progress Notes (Signed)
Mrs. Debbie Velasquez returns today status post left total knee arthroplasty 45 days. She's overall trend towards improvement. She is now going to outpatient physical therapy. She does ambulate with a cane. She has no pain in the knee but that some soreness only. She's having no chest pain shortness breath fevers chills  Physical exam left knee she has full extension flexion to approximately 110. Surgical incisions healing well no signs of infection. Calf supple nontender.  Plan :She'll continue to work on range of motion strengthening. She's on 81 mg aspirin and she'll continue to take this as she is on a preop. Follow up with [place] in 1 month. Scar tissue mobilization encouraged

## 2017-03-02 ENCOUNTER — Other Ambulatory Visit: Payer: Medicare Other

## 2017-03-08 ENCOUNTER — Ambulatory Visit
Admission: RE | Admit: 2017-03-08 | Discharge: 2017-03-08 | Disposition: A | Discharge status: Home / Self Care | Payer: Medicare Other | Source: Ambulatory Visit | Attending: Internal Medicine | Admitting: Internal Medicine

## 2017-03-08 DIAGNOSIS — E2839 Other primary ovarian failure: Secondary | ICD-10-CM

## 2017-03-08 DIAGNOSIS — Z1231 Encounter for screening mammogram for malignant neoplasm of breast: Secondary | ICD-10-CM

## 2017-03-23 NOTE — Anesthesia Postprocedure Evaluation (Signed)
Anesthesia Post Note  Patient: Debbie Velasquez  Procedure(s) Performed: Procedure(s) (LRB): LEFT TOTAL KNEE ARTHROPLASTY and Right knee steroid injection (Left)     Anesthesia Post Evaluation  Last Vitals:  Vitals:   01/15/17 0520 01/15/17 1335  BP: 140/61 (!) 167/68  Pulse: 83 89  Resp: 20 15  Temp: 36.8 C 36.7 C    Last Pain:  Vitals:   01/15/17 1335  TempSrc: Oral  PainSc:                  Effie Berkshire

## 2017-03-23 NOTE — Addendum Note (Signed)
Addendum  created 03/23/17 1122 by Effie Berkshire, MD   Sign clinical note

## 2017-03-26 ENCOUNTER — Encounter (INDEPENDENT_AMBULATORY_CARE_PROVIDER_SITE_OTHER): Payer: Self-pay | Admitting: Physician Assistant

## 2017-03-26 ENCOUNTER — Ambulatory Visit (INDEPENDENT_AMBULATORY_CARE_PROVIDER_SITE_OTHER): Payer: Medicare Other | Admitting: Physician Assistant

## 2017-03-26 DIAGNOSIS — M1711 Unilateral primary osteoarthritis, right knee: Secondary | ICD-10-CM | POA: Diagnosis not present

## 2017-03-26 DIAGNOSIS — Z96652 Presence of left artificial knee joint: Secondary | ICD-10-CM

## 2017-03-26 MED ORDER — LIDOCAINE HCL 1 % IJ SOLN
3.0000 mL | INTRAMUSCULAR | Status: AC | PRN
  Administered 2017-03-26: 3 mL

## 2017-03-26 MED ORDER — METHYLPREDNISOLONE ACETATE 40 MG/ML IJ SUSP
40.0000 mg | INTRAMUSCULAR | Status: AC | PRN
  Administered 2017-03-26: 40 mg via INTRA_ARTICULAR

## 2017-03-26 NOTE — Progress Notes (Signed)
Office Visit Note   Patient: Debbie Velasquez           Date of Birth: 31-May-1951           MRN: [id number] Visit Date: 03/26/2017              Requested by: Nolene Ebbs, MD [address] [place], [place] [address] PCP: Nolene Ebbs, MD   Assessment & Plan: Visit Diagnoses:  1. Primary osteoarthritis of right knee   2. Status post total left knee replacement     Plan: She'll follow up with [place] for a supplemental injection of her right. Continue to work on quad strengthening both knees. Discussed scar tissue mobilization left knee surgical scar.  Follow-Up Instructions: Return for Supplemental injection.   Orders:  Orders Placed This Encounter  Procedures  . Large Joint Injection/Arthrocentesis   No orders of the defined types were placed in this encounter.     Procedures: Large Joint Inj Date/Time: 03/26/2017 9:12 AM Performed by: Pete Pelt Authorized by: Pete Pelt   Consent Given by:  Patient Indications:  Pain Location:  Knee Site:  R knee Needle Size:  22 G Approach:  Anterolateral Ultrasound Guidance: No   Fluoroscopic Guidance: No   Medications:  40 mg methylPREDNISolone acetate 40 MG/ML; 3 mL lidocaine 1 % Aspiration Attempted: No   Patient tolerance:  Patient tolerated the procedure well with no immediate complications     Clinical Data: No additional findings.   Subjective: Chief Complaint  Patient presents with  . Left Knee - Follow-up, Routine Post Op    HPI Debbie Velasquez returns today follow-up left total knee arthroplasty 01/12/2017. She is overall doing well. She is complaining of increased right knee pain which she has known tricompartmental arthritis of. She is requesting injection in the right knee. Review of Systems No fevers chills shortness breath calf pain or chest pain  Objective: Vital Signs: There were no vitals taken for this visit.  Physical Exam  Constitutional: She is oriented to person, place,  and time. She appears well-developed and well-nourished. No distress.  Neurological: She is alert and oriented to person, place, and time.  Psychiatric: She has a normal mood and affect. Her behavior is normal.    Ortho Exam Left knee she has full extension flexion beyond 110. No instability with valgus varus stressing. Surgical incisions healing well she has formed a bit of a keloid though. Right knee good range of motion. She has tenderness along medial joint line. No instability valgus varus stressing. No effusion or abnormal warmth. Right calf supple Specialty Comments:  No specialty comments available.  Imaging: No results found.   PMFS History: Patient Active Problem List   Diagnosis Date Noted  . Primary osteoarthritis of right knee 03/26/2017  . Presence of left artificial knee joint 02/26/2017  . Unilateral primary osteoarthritis, left knee 01/12/2017  . 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/12/2017  . Renal mass 12/22/2015   Past Medical History:  Diagnosis Date  . Arthritis   . Cancer ([place])    Left renal mass  . History of kidney stones    x3 -remains with one on right.   Marland Kitchen Hx of seasonal allergies    rare flare ups  . Hypertension   . Left renal mass   . Obesity   . Pneumonia    hx of 20 years ago  . Right ureteral stone   . Type 2 diabetes mellitus (HCC)    Type  2  . Wears glasses   . Wears partial dentures    upper    Family History  Problem Relation Age of Onset  . Esophageal cancer Maternal Aunt   . Colon cancer Neg Hx   . Stomach cancer Neg Hx   . Rectal cancer Neg Hx     Past Surgical History:  Procedure Laterality Date  . CESAREAN SECTION  yrs ago  . ROBOTIC ASSITED PARTIAL NEPHRECTOMY Left 12/22/2015   Procedure: XI ROBOTIC ASSITED PARTIAL NEPHRECTOMY;  Surgeon: Alexis Frock, MD;  Location: [facility];  Service: Urology;  Laterality: Left;  . TOTAL ABDOMINAL HYSTERECTOMY W/ BILATERAL SALPINGOOPHORECTOMY  1980's  . TOTAL KNEE ARTHROPLASTY  Left 01/12/2017   Procedure: LEFT TOTAL KNEE ARTHROPLASTY and Right knee steroid injection;  Surgeon: Mcarthur Rossetti, MD;  Location: [facility];  Service: Orthopedics;  Laterality: Left;   Social History   Occupational History  . Not on file.   Social History Main Topics  . Smoking status: Never Smoker  . Smokeless tobacco: Never Used  . Alcohol use No  . Drug use: No  . Sexual activity: Not Currently

## 2017-03-30 ENCOUNTER — Telehealth (INDEPENDENT_AMBULATORY_CARE_PROVIDER_SITE_OTHER): Payer: Self-pay

## 2017-03-30 NOTE — Telephone Encounter (Signed)
Can we call her and make an appt for her to have her Monovisc injection  thanks

## 2017-04-02 ENCOUNTER — Telehealth (INDEPENDENT_AMBULATORY_CARE_PROVIDER_SITE_OTHER): Payer: Self-pay | Admitting: Orthopaedic Surgery

## 2017-04-02 NOTE — Telephone Encounter (Signed)
Called patient left message on voicemail to return call. [phone]   [phone]

## 2017-04-02 NOTE — Telephone Encounter (Signed)
Patient scheduled 04/16/17  At 9:15 am for monovisc injection.

## 2017-04-04 ENCOUNTER — Encounter (HOSPITAL_COMMUNITY): Payer: Self-pay | Admitting: Emergency Medicine

## 2017-04-04 DIAGNOSIS — Z8553 Personal history of malignant neoplasm of renal pelvis: Secondary | ICD-10-CM | POA: Diagnosis not present

## 2017-04-04 DIAGNOSIS — Z96652 Presence of left artificial knee joint: Secondary | ICD-10-CM | POA: Insufficient documentation

## 2017-04-04 DIAGNOSIS — E119 Type 2 diabetes mellitus without complications: Secondary | ICD-10-CM | POA: Insufficient documentation

## 2017-04-04 DIAGNOSIS — N390 Urinary tract infection, site not specified: Secondary | ICD-10-CM | POA: Insufficient documentation

## 2017-04-04 DIAGNOSIS — Z79899 Other long term (current) drug therapy: Secondary | ICD-10-CM | POA: Insufficient documentation

## 2017-04-04 DIAGNOSIS — R350 Frequency of micturition: Secondary | ICD-10-CM | POA: Diagnosis present

## 2017-04-04 DIAGNOSIS — Z7984 Long term (current) use of oral hypoglycemic drugs: Secondary | ICD-10-CM | POA: Diagnosis not present

## 2017-04-04 DIAGNOSIS — I10 Essential (primary) hypertension: Secondary | ICD-10-CM | POA: Insufficient documentation

## 2017-04-04 LAB — URINALYSIS, ROUTINE W REFLEX MICROSCOPIC
BILIRUBIN URINE: NEGATIVE
Glucose, UA: NEGATIVE mg/dL
Ketones, ur: NEGATIVE mg/dL
Nitrite: NEGATIVE
PH: 5 (ref 5.0–8.0)
Protein, ur: NEGATIVE mg/dL
SPECIFIC GRAVITY, URINE: 1.018 (ref 1.005–1.030)

## 2017-04-04 NOTE — ED Triage Notes (Signed)
Pt states she may have a UTI or is trying to pass a kidney stone  Pt states she is having lower back pain, vaginal pressure, and urinary frequency  Pt states her sxs started earlier today   Pt states she voided earlier today and it looked like it had some small gravel in it

## 2017-04-05 ENCOUNTER — Emergency Department (HOSPITAL_COMMUNITY)
Admission: EM | Admit: 2017-04-05 | Discharge: 2017-04-05 | Disposition: A | Discharge status: Home / Self Care | Payer: Medicare Other | Attending: Emergency Medicine | Admitting: Emergency Medicine

## 2017-04-05 ENCOUNTER — Emergency Department (HOSPITAL_COMMUNITY): Payer: Medicare Other

## 2017-04-05 DIAGNOSIS — N390 Urinary tract infection, site not specified: Secondary | ICD-10-CM

## 2017-04-05 MED ORDER — CEPHALEXIN 500 MG PO CAPS: 500.0000 mg | ORAL_CAPSULE | Freq: Two times a day (BID) | ORAL | 0 refills | Status: DC

## 2017-04-05 MED ORDER — CEPHALEXIN 500 MG PO CAPS
1000.0000 mg | ORAL_CAPSULE | Freq: Once | ORAL | Status: AC
  Administered 2017-04-05: 1000 mg via ORAL
  Filled 2017-04-05: qty 2

## 2017-04-05 NOTE — ED Provider Notes (Signed)
[facility] DEPT Provider Note: Georgena Spurling, MD, FACEP  CSN: [id number] MRN: [id number] ARRIVAL: 04/04/17 at [place]: [place]  Urinary Frequency   HISTORY OF PRESENT ILLNESS  Debbie Velasquez is a 66 y.o. female with a history of nephrolithiasis as well as partial nephrectomy of the left kidney for renal cell carcinoma. 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 She is here with a one-day history of increased urinary frequency but without dysuria. She states she passed some sandy colored granules in her urine earlier today. She is having generalized back pain which she rates as a 2 out of 10. She denies fever or chills. She recently had a left knee replacement and is having pain in that knee which she attributes to ongoing physical therapy.   Past Medical History:  Diagnosis Date  . Arthritis   . Cancer ([place])    Left renal mass  . History of kidney stones    x3 -remains with one on right.   Marland Kitchen Hx of seasonal allergies    rare flare ups  . Hypertension   . Left renal mass   . Obesity   . Pneumonia    hx of 20 years ago  . Right ureteral stone   . Type 2 diabetes mellitus (HCC)    Type 2  . Wears glasses   . Wears partial dentures    upper    Past Surgical History:  Procedure Laterality Date  . CESAREAN SECTION  yrs ago  . ROBOTIC ASSITED PARTIAL NEPHRECTOMY Left 12/22/2015   Procedure: XI ROBOTIC ASSITED PARTIAL NEPHRECTOMY;  Surgeon: Alexis Frock, MD;  Location: [facility];  Service: Urology;  Laterality: Left;  . TOTAL ABDOMINAL HYSTERECTOMY W/ BILATERAL SALPINGOOPHORECTOMY  1980's  . TOTAL KNEE ARTHROPLASTY Left 01/12/2017   Procedure: LEFT TOTAL KNEE ARTHROPLASTY and Right knee steroid injection;  Surgeon: Mcarthur Rossetti, MD;  Location: [facility];  Service: Orthopedics;  Laterality: Left;    Family History  Problem Relation Age of Onset  . Esophageal cancer Maternal Aunt   .  Colon cancer Neg Hx   . Stomach cancer Neg Hx   . Rectal cancer Neg Hx     Social History  Substance Use Topics  . Smoking status: Never Smoker  . Smokeless tobacco: Never Used  . Alcohol use No    Prior to Admission medications   Medication Sig Start Date End Date Taking? Authorizing Provider  acetaminophen (TYLENOL) 500 MG tablet Take 1,000 mg by mouth every 6 (six) hours as needed for mild pain or headache.   Yes [provider]  aspirin 81 MG chewable tablet Chew 1 tablet (81 mg total) by mouth 2 (two) times daily. 01/15/17  Yes Mcarthur Rossetti, MD  atorvastatin (LIPITOR) 10 MG tablet Take 1 tablet by mouth daily. 02/13/17  Yes [provider]  losartan-hydrochlorothiazide (HYZAAR) 100-12.5 MG tablet Take 1 tablet by mouth daily.  08/23/15  Yes [provider]  metFORMIN (GLUCOPHAGE) 500 MG tablet Take 500 mg by mouth daily with breakfast.    Yes [provider]  cephALEXin (KEFLEX) 500 MG capsule Take 1 capsule (500 mg total) by mouth 2 (two) times daily. 04/05/17   Ritter Helsley, Jenny Reichmann, MD    Allergies Patient has no known allergies.   REVIEW OF SYSTEMS  Negative except as noted here or in the History of Present Illness.   PHYSICAL EXAMINATION  Initial Vital Signs  Blood pressure (!) 182/79, pulse 72, temperature 98.2 F (36.8 C), temperature source Oral, resp. rate 18, height 5\' 2"  (1.575 m), weight 115.7 kg (255 lb), SpO2 100 %.  Examination General: Well-developed, well-nourished female in no acute distress; appearance consistent with age of record HENT: normocephalic; atraumatic Eyes: pupils equal, round and reactive to light; extraocular muscles intact Neck: supple Heart: regular rate and rhythm Lungs: clear to auscultation bilaterally Abdomen: soft; nondistended; nontender; bowel sounds present GU: No CVA tenderness Extremities: No deformity; well healing surgical incision of left knee consistent with recent TKA; pulses  normal Neurologic: Awake, alert and oriented; motor function intact in all extremities and symmetric; no facial droop Skin: Warm and dry Psychiatric: Normal mood and affect   RESULTS  Summary of this visit's results, reviewed by myself:   EKG Interpretation  Date/Time:    Ventricular Rate:    PR Interval:    QRS Duration:   QT Interval:    QTC Calculation:   R Axis:     Text Interpretation:        Laboratory Studies: Results for orders placed or performed during the hospital encounter of 04/05/17 (from the past 24 hour(s))  Urinalysis, Routine w reflex microscopic- may I&O cath if menses     Status: Abnormal   Collection Time: 04/04/17 11:15 PM  Result Value Ref Range   Color, Urine STRAW (A) YELLOW   APPearance HAZY (A) CLEAR   Specific Gravity, Urine 1.018 1.005 - 1.030   pH 5.0 5.0 - 8.0   Glucose, UA NEGATIVE NEGATIVE mg/dL   Hgb urine dipstick MODERATE (A) NEGATIVE   Bilirubin Urine NEGATIVE NEGATIVE   Ketones, ur NEGATIVE NEGATIVE mg/dL   Protein, ur NEGATIVE NEGATIVE mg/dL   Nitrite NEGATIVE NEGATIVE   Leukocytes, UA LARGE (A) NEGATIVE   RBC / HPF 6-30 0 - 5 RBC/hpf   WBC, UA 6-30 0 - 5 WBC/hpf   Bacteria, UA RARE (A) NONE SEEN   Squamous Epithelial / LPF 0-5 (A) NONE SEEN   Mucous PRESENT    Uric Acid Crys, UA PRESENT    Imaging Studies: Ct Renal Stone Study  Result Date: 04/05/2017 CLINICAL DATA:  Acute onset of lower back pain and increased urinary frequency. Initial encounter. EXAM: CT ABDOMEN AND PELVIS WITHOUT CONTRAST TECHNIQUE: Multidetector CT imaging of the abdomen and pelvis was performed following the standard protocol without IV contrast. COMPARISON:  CT of the abdomen and pelvis from 07/06/2016, and MRI of abdomen performed 09/08/2016 FINDINGS: Lower chest: The visualized lung bases are grossly clear. The visualized portions of the mediastinum are unremarkable. Hepatobiliary: The hemangioma at the left hepatic lobe has increased in size, now  measuring 2.4 cm. The liver is otherwise unremarkable in appearance. The common bile duct remains normal caliber. The gallbladder is unremarkable in appearance. Pancreas: The pancreas is within normal limits. Spleen: The spleen is unremarkable in appearance. Adrenals/Urinary Tract: The adrenal glands are unremarkable in appearance. The known medial right renal mass is not well characterized without contrast. Right renal stones measure up to 1.3 cm in size. Mild perinephric stranding is noted bilaterally. Postoperative change is noted at the left kidney. There is no evidence of hydronephrosis. No obstructing ureteral stones are identified. Stomach/Bowel: The stomach is unremarkable in appearance. The small bowel is within normal limits. The appendix is normal in caliber, without evidence of appendicitis. Minimal diverticulosis is noted at the mid sigmoid colon, without evidence of diverticulitis. Vascular/Lymphatic: The abdominal aorta is unremarkable in appearance. The inferior vena  cava is grossly unremarkable. No retroperitoneal lymphadenopathy is seen. No pelvic sidewall lymphadenopathy is identified. Reproductive: The bladder is decompressed and not well assessed. The patient is status post hysterectomy. No suspicious adnexal masses are seen. Other: A small to moderate anterior abdominal wall hernia is noted at the left lower quadrant, containing only fat. Musculoskeletal: No acute osseous abnormalities are identified. Facet disease is noted along the lumbar spine. The visualized musculature is unremarkable in appearance. IMPRESSION: 1. No acute abnormality seen within the abdomen or pelvis. 2. Known medial right renal mass is not well characterized without contrast, though it remains suspicious for renal cell carcinoma as noted on MRI of the abdomen performed 09/08/2016. 3. Right renal stones measure up to 1.3 cm in size. No evidence of hydronephrosis. 4. 2.4 cm benign hemangioma at the left hepatic lobe has  increased in size. 5. Minimal diverticulosis at the mid sigmoid colon, without evidence of diverticulitis. 6. Small to moderate anterior abdominal wall hernia at the left lower quadrant, containing only fat. Electronically Signed   By: Garald Balding M.D.   On: 04/05/2017 02:32    ED COURSE  Nursing notes and initial vitals signs, including pulse oximetry, reviewed.  Vitals:   04/04/17 2247 04/05/17 0108  BP: (!) 182/79 (!) 167/89  Pulse: 72 66  Resp: 18 14  Temp: 98.2 F (36.8 C)   TempSrc: Oral   SpO2: 100% 100%  Weight: 115.7 kg (255 lb)   Height: 5\' 2"  (1.575 m)    2:56 AM Patient advised of reassuring CT scan. We will treat for urinary tract infection.  PROCEDURES    ED DIAGNOSES     ICD-10-CM   1. Lower urinary tract infectious disease N39.0        Bertha Earwood, MD 04/05/17 [id number]

## 2017-04-05 NOTE — ED Notes (Signed)
MD at bedside. 

## 2017-04-06 ENCOUNTER — Ambulatory Visit (INDEPENDENT_AMBULATORY_CARE_PROVIDER_SITE_OTHER): Payer: Medicare Other | Admitting: Physician Assistant

## 2017-04-06 LAB — URINE CULTURE

## 2017-04-16 ENCOUNTER — Ambulatory Visit (INDEPENDENT_AMBULATORY_CARE_PROVIDER_SITE_OTHER): Payer: Medicare Other | Admitting: Physician Assistant

## 2017-04-16 DIAGNOSIS — M1711 Unilateral primary osteoarthritis, right knee: Secondary | ICD-10-CM | POA: Diagnosis not present

## 2017-04-16 MED ORDER — HYALURONAN 88 MG/4ML IX SOSY
88.0000 mg | PREFILLED_SYRINGE | INTRA_ARTICULAR | Status: AC | PRN
  Administered 2017-04-16: 88 mg via INTRA_ARTICULAR

## 2017-04-16 NOTE — Progress Notes (Signed)
   Procedure Note  Patient: Debbie Velasquez             Date of Birth: 1951-07-20           MRN: [id number]             Visit Date: 04/16/2017  Procedures: Visit Diagnoses: Primary osteoarthritis of right knee  Large Joint Inj Date/Time: 04/16/2017 10:02 AM Performed by: Pete Pelt Authorized by: Pete Pelt   Consent Given by:  Patient Indications:  Pain Location:  Knee Site:  R knee Needle Size:  22 G Approach:  Anterolateral Ultrasound Guidance: No   Fluoroscopic Guidance: No   Medications:  88 mg Hyaluronan 88 MG/4ML Aspiration Attempted: No   Patient tolerance:  Patient tolerated the procedure well with no immediate complications   Plan: See her back in 8 weeks' check progress lack of. Questions were encouraged and answered.

## 2017-05-26 ENCOUNTER — Encounter (HOSPITAL_COMMUNITY): Payer: Self-pay

## 2017-05-26 ENCOUNTER — Emergency Department (HOSPITAL_COMMUNITY): Payer: Medicare Other

## 2017-05-26 ENCOUNTER — Emergency Department (HOSPITAL_COMMUNITY)
Admission: EM | Admit: 2017-05-26 | Discharge: 2017-05-26 | Disposition: A | Discharge status: Home / Self Care | Payer: Medicare Other | Attending: Emergency Medicine | Admitting: Emergency Medicine

## 2017-05-26 DIAGNOSIS — N200 Calculus of kidney: Secondary | ICD-10-CM

## 2017-05-26 DIAGNOSIS — Z96652 Presence of left artificial knee joint: Secondary | ICD-10-CM | POA: Insufficient documentation

## 2017-05-26 DIAGNOSIS — R109 Unspecified abdominal pain: Secondary | ICD-10-CM | POA: Diagnosis present

## 2017-05-26 DIAGNOSIS — Z79899 Other long term (current) drug therapy: Secondary | ICD-10-CM | POA: Diagnosis not present

## 2017-05-26 DIAGNOSIS — Z7982 Long term (current) use of aspirin: Secondary | ICD-10-CM | POA: Diagnosis not present

## 2017-05-26 DIAGNOSIS — E119 Type 2 diabetes mellitus without complications: Secondary | ICD-10-CM | POA: Insufficient documentation

## 2017-05-26 DIAGNOSIS — I10 Essential (primary) hypertension: Secondary | ICD-10-CM | POA: Insufficient documentation

## 2017-05-26 LAB — COMPREHENSIVE METABOLIC PANEL
ALBUMIN: 4.1 g/dL (ref 3.5–5.0)
ALT: 13 U/L — ABNORMAL LOW (ref 14–54)
AST: 24 U/L (ref 15–41)
Alkaline Phosphatase: 68 U/L (ref 38–126)
Anion gap: 10 (ref 5–15)
BUN: 30 mg/dL — AB (ref 6–20)
CHLORIDE: 101 mmol/L (ref 101–111)
CO2: 25 mmol/L (ref 22–32)
Calcium: 9.5 mg/dL (ref 8.9–10.3)
Creatinine, Ser: 1.13 mg/dL — ABNORMAL HIGH (ref 0.44–1.00)
GFR calc Af Amer: 57 mL/min — ABNORMAL LOW (ref >60)
GFR, EST NON AFRICAN AMERICAN: 49 mL/min — AB (ref >60)
Glucose, Bld: 163 mg/dL — ABNORMAL HIGH (ref 65–99)
POTASSIUM: 4 mmol/L (ref 3.5–5.1)
Sodium: 136 mmol/L (ref 135–145)
TOTAL PROTEIN: 8.3 g/dL — AB (ref 6.5–8.1)
Total Bilirubin: 0.4 mg/dL (ref 0.3–1.2)

## 2017-05-26 LAB — CBC
HCT: 33.6 % — ABNORMAL LOW (ref 36.0–46.0)
Hemoglobin: 10.7 g/dL — ABNORMAL LOW (ref 12.0–15.0)
MCH: 27.1 pg (ref 26.0–34.0)
MCHC: 31.8 g/dL (ref 30.0–36.0)
MCV: 85.1 fL (ref 78.0–100.0)
PLATELETS: 386 10*3/uL (ref 150–400)
RBC: 3.95 MIL/uL (ref 3.87–5.11)
RDW: 13.8 % (ref 11.5–15.5)
WBC: 14.1 10*3/uL — AB (ref 4.0–10.5)

## 2017-05-26 LAB — URINALYSIS, ROUTINE W REFLEX MICROSCOPIC
BILIRUBIN URINE: NEGATIVE
Bacteria, UA: NONE SEEN
Glucose, UA: NEGATIVE mg/dL
Ketones, ur: 5 mg/dL — AB
NITRITE: NEGATIVE
PH: 5 (ref 5.0–8.0)
Protein, ur: NEGATIVE mg/dL
SPECIFIC GRAVITY, URINE: 1.02 (ref 1.005–1.030)
WBC, UA: NONE SEEN WBC/hpf (ref 0–5)

## 2017-05-26 LAB — LIPASE, BLOOD: LIPASE: 53 U/L — AB (ref 11–51)

## 2017-05-26 MED ORDER — MORPHINE SULFATE (PF) 4 MG/ML IV SOLN: 4.0000 mg | Freq: Once | INTRAVENOUS | Status: DC

## 2017-05-26 MED ORDER — ONDANSETRON HCL 4 MG/2ML IJ SOLN: 4.0000 mg | Freq: Once | INTRAMUSCULAR | Status: DC

## 2017-05-26 MED ORDER — KETOROLAC TROMETHAMINE 30 MG/ML IJ SOLN
30.0000 mg | Freq: Once | INTRAMUSCULAR | Status: DC
Start: 2017-05-26 — End: 2017-05-26

## 2017-05-26 NOTE — ED Triage Notes (Signed)
Pt with abdominal pain and back pain starting today.  Nausea no vomiting.  No change in urination.

## 2017-05-26 NOTE — ED Notes (Signed)
Patient transported to CT 

## 2017-05-26 NOTE — ED Provider Notes (Signed)
[facility] DEPT Provider Note   CSN: [id number] Arrival date & time: 05/26/17  7628     History   Chief Complaint Chief Complaint  Patient presents with  . Abdominal Pain  . Back Pain    HPI Debbie Velasquez is a 66 y.o. female.  Patient is a 66 year old female with past mental history of renal calculi, hypertension, renal mass, type 2 diabetes. She presents today with complaints of right-sided flank pain that started this morning. It is associated with nausea and vomiting. She denies any bowel or bladder complaints. This does feel similar to prior renal calculi.   The history is provided by the patient.  Abdominal Pain   This is a recurrent problem. The current episode started 3 to 5 hours ago. The problem occurs constantly. The problem has been rapidly worsening. The pain is associated with an unknown factor. Pain location: Right flank. The quality of the pain is colicky and cramping. The pain is severe. Pertinent negatives include melena, vomiting and constipation. Nothing aggravates the symptoms. Nothing relieves the symptoms.    Past Medical History:  Diagnosis Date  . Arthritis   . Cancer ([place])    Left renal mass  . History of kidney stones    x3 -remains with one on right.   Marland Kitchen Hx of seasonal allergies    rare flare ups  . Hypertension   . Left renal mass   . Obesity   . Pneumonia    hx of 20 years ago  . Right ureteral stone   . Type 2 diabetes mellitus (HCC)    Type 2  . Wears glasses   . Wears partial dentures    upper    Patient Active Problem List   Diagnosis Date Noted  . Primary osteoarthritis of right knee 03/26/2017  . Presence of left artificial knee joint 02/26/2017  . Unilateral primary osteoarthritis, left knee 01/12/2017  . 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/12/2017  . Renal mass 12/22/2015    Past Surgical History:  Procedure Laterality Date  . CESAREAN SECTION  yrs ago  . ROBOTIC ASSITED PARTIAL NEPHRECTOMY Left  12/22/2015   Procedure: XI ROBOTIC ASSITED PARTIAL NEPHRECTOMY;  Surgeon: Alexis Frock, MD;  Location: [facility];  Service: Urology;  Laterality: Left;  . TOTAL ABDOMINAL HYSTERECTOMY W/ BILATERAL SALPINGOOPHORECTOMY  1980's  . TOTAL KNEE ARTHROPLASTY Left 01/12/2017   Procedure: LEFT TOTAL KNEE ARTHROPLASTY and Right knee steroid injection;  Surgeon: Mcarthur Rossetti, MD;  Location: [facility];  Service: Orthopedics;  Laterality: Left;    OB History    No data available       Home Medications    Prior to Admission medications   Medication Sig Start Date End Date Taking? Authorizing Provider  acetaminophen (TYLENOL) 500 MG tablet Take 1,000 mg by mouth every 6 (six) hours as needed for mild pain or headache.    [provider]  aspirin 81 MG chewable tablet Chew 1 tablet (81 mg total) by mouth 2 (two) times daily. 01/15/17   Mcarthur Rossetti, MD  atorvastatin (LIPITOR) 10 MG tablet Take 1 tablet by mouth daily. 02/13/17   [provider]  cephALEXin (KEFLEX) 500 MG capsule Take 1 capsule (500 mg total) by mouth 2 (two) times daily. 04/05/17   Molpus, John, MD  losartan-hydrochlorothiazide (HYZAAR) 100-12.5 MG tablet Take 1 tablet by mouth daily.  08/23/15   [provider]  metFORMIN (GLUCOPHAGE) 500 MG tablet Take 500 mg by mouth daily with breakfast.  [provider]    Family History Family History  Problem Relation Age of Onset  . Esophageal cancer Maternal Aunt   . Colon cancer Neg Hx   . Stomach cancer Neg Hx   . Rectal cancer Neg Hx     Social History Social History  Substance Use Topics  . Smoking status: Never Smoker  . Smokeless tobacco: Never Used  . Alcohol use No     Allergies   Patient has no known allergies.   Review of Systems Review of Systems  Gastrointestinal: Negative for constipation, melena and vomiting.  All other systems reviewed and are negative.    Physical Exam Updated Vital Signs BP (!) 154/70  (BP Location: Right Arm)   Pulse 67   Temp 98.2 F (36.8 C) (Oral)   Resp 18   Ht 5\' 4"  (1.626 m)   Wt 113.4 kg (250 lb)   SpO2 100%   BMI 42.91 kg/m   Physical Exam  Constitutional: She is oriented to person, place, and time. She appears well-developed and well-nourished. No distress.  Patient appears somewhat pale.  HENT:  Head: Normocephalic and atraumatic.  Neck: Normal range of motion. Neck supple.  Cardiovascular: Normal rate and regular rhythm.  Exam reveals no gallop and no friction rub.   No murmur heard. Pulmonary/Chest: Effort normal and breath sounds normal. No respiratory distress. She has no wheezes.  Abdominal: Soft. Bowel sounds are normal. She exhibits no distension. There is tenderness. There is no rebound and no guarding.  There is ttp in the right flank. No abdominal ttp.  Musculoskeletal: Normal range of motion.  Neurological: She is alert and oriented to person, place, and time.  Skin: Skin is warm and dry. She is not diaphoretic.  Nursing note and vitals reviewed.    ED Treatments / Results  Labs (all labs ordered are listed, but only abnormal results are displayed) Labs Reviewed  CBC - Abnormal; Notable for the following:       Result Value   WBC 14.1 (*)    Hemoglobin 10.7 (*)    HCT 33.6 (*)    All other components within normal limits  LIPASE, BLOOD  COMPREHENSIVE METABOLIC PANEL  URINALYSIS, ROUTINE W REFLEX MICROSCOPIC    EKG  EKG Interpretation None       Radiology No results found.  Procedures Procedures (including critical care time)  Medications Ordered in ED Medications  ondansetron (ZOFRAN) injection 4 mg (not administered)  morphine 4 MG/ML injection 4 mg (not administered)  ketorolac (TORADOL) 30 MG/ML injection 30 mg (not administered)     Initial Impression / Assessment and Plan / ED Course  I have reviewed the triage vital signs and the nursing notes.  Pertinent labs & imaging results that were available  during my care of the patient were reviewed by me and considered in my medical decision making (see chart for details).  CT scan shows what appears to be a recently passed stone in the urinary bladder measuring 5 mm. Her pain is now completely gone. She will be discharged, to follow-up as needed for any problems.  Final Clinical Impressions(s) / ED Diagnoses   Final diagnoses:  None    New Prescriptions New Prescriptions   No medications on file     Veryl Speak, MD 05/26/17 1245

## 2017-05-26 NOTE — Discharge Instructions (Signed)
Continue medications as before.  Return to the emergency department if you develop high fever, recurrent pain, or other new and concerning symptoms.

## 2017-06-18 ENCOUNTER — Ambulatory Visit (INDEPENDENT_AMBULATORY_CARE_PROVIDER_SITE_OTHER): Payer: Medicare Other

## 2017-06-18 ENCOUNTER — Ambulatory Visit (INDEPENDENT_AMBULATORY_CARE_PROVIDER_SITE_OTHER): Payer: Medicare Other | Admitting: Physician Assistant

## 2017-06-18 ENCOUNTER — Encounter (INDEPENDENT_AMBULATORY_CARE_PROVIDER_SITE_OTHER): Payer: Self-pay | Admitting: Physician Assistant

## 2017-06-18 VITALS — Ht 64.0 in | Wt 250.0 lb

## 2017-06-18 DIAGNOSIS — M1711 Unilateral primary osteoarthritis, right knee: Secondary | ICD-10-CM | POA: Diagnosis not present

## 2017-06-18 DIAGNOSIS — M545 Low back pain, unspecified: Secondary | ICD-10-CM | POA: Insufficient documentation

## 2017-06-18 DIAGNOSIS — Z96652 Presence of left artificial knee joint: Secondary | ICD-10-CM

## 2017-06-18 DIAGNOSIS — G8929 Other chronic pain: Secondary | ICD-10-CM

## 2017-06-18 MED ORDER — METHOCARBAMOL 500 MG PO TABS: 500.0000 mg | ORAL_TABLET | Freq: Every day | ORAL | 1 refills | Status: DC | PRN

## 2017-06-18 MED ORDER — MELOXICAM 7.5 MG PO TABS: 7.5000 mg | ORAL_TABLET | Freq: Every day | ORAL | 1 refills | Status: DC | PRN

## 2017-06-18 MED ORDER — DICLOFENAC SODIUM 2 % TD SOLN: 2.0000 | Freq: Two times a day (BID) | TRANSDERMAL | 1 refills | Status: DC

## 2017-06-18 NOTE — Progress Notes (Signed)
Office Visit Note   Patient: Debbie Velasquez           Date of Birth: 03-08-1951           MRN: [id number] Visit Date: 06/18/2017              Requested by: Nolene Ebbs, MD [address] [place], [place] [address] PCP: Nolene Ebbs, MD   Assessment & Plan: Visit Diagnoses:  1. Chronic low back pain without sciatica, unspecified back pain laterality   2. Primary osteoarthritis of right knee   3. Status post total left knee replacement     Plan: We will send her to physical therapy both for her back and both knees. She'll continue her Robaxin and mobility which was given refills on today. Her back pain becomes worse or does not improve with physical therapy would recommend MRI to evaluate for an HNP. Questions encouraged and answered.  Follow-Up Instructions: Return in about 4 weeks (around 07/16/2017).   Orders:  Orders Placed This Encounter  Procedures  . XR Lumbar Spine 2-3 Views   Meds ordered this encounter  Medications  . Diclofenac Sodium 2 % SOLN    Sig: Place 2 Squirts onto the skin 2 (two) times daily.    Dispense:  1 Bottle    Refill:  1  . methocarbamol (ROBAXIN) 500 MG tablet    Sig: Take 1 tablet (500 mg total) by mouth daily as needed.    Dispense:  30 tablet    Refill:  1  . meloxicam (MOBIC) 7.5 MG tablet    Sig: Take 1 tablet (7.5 mg total) by mouth daily as needed.    Dispense:  30 tablet    Refill:  1      Procedures: No procedures performed   Clinical Data: No additional findings.   Subjective: Chief Complaint  Patient presents with  . Right Knee - Follow-up    HPI Debbie Velasquez returns today 2 months status post monitor disc injection right knee. She states the knee pain is improved in the right knee. No mechanical symptoms. She is also 5 months status post left total knee arthroplasty is still having some aching in the knee. She is using a cane but states this is because of her back pain. She only has back pain when  ambulating or standing for prolonged period time. She's had no known injury to her back. She states that her back pain is ongoing for her left knee surgery and that she felt it was coming from her knee. She's having no radicular symptoms down either leg. No bowel bladder dysfunction. Review of Systems No bowel bladder dysfunction, waking pain or radicular symptoms. Please see history of present illness otherwise negative.  Objective: Vital Signs: Ht 5\' 4"  (1.626 m)   Wt 250 lb (113.4 kg)   BMI 42.91 kg/m   Physical Exam  Constitutional: She is oriented to person, place, and time. She appears well-developed and well-nourished. No distress.  Cardiovascular: Intact distal pulses.   Pulmonary/Chest: Effort normal.  Neurological: She is alert and oriented to person, place, and time.  Skin: She is not diaphoretic.  Psychiatric: She has a normal mood and affect. Her behavior is normal.    Ortho Exam Bilateral knee she has good range of motion of both knees without pain. No instability valgus varus stressing. There is no effusion abnormal warmth or erythema of either knee. Well-healed surgical incision left knee. Bilateral calf supple nontender. Out of 5 strengths throughout  the lower extremities against resistance. Negative straight leg raise bilaterally. She has full forward flexion lumbar spine without pain. Limited extension with pain. She has tenderness over the lower lumbar spinal column. No tenderness in the paraspinous region. Deep tendon reflexes are 2+ at the knees and 1+ at the ankles and equal and symmetric. Specialty Comments:  No specialty comments available.  Imaging: Xr Lumbar Spine 2-3 Views  Result Date: 06/18/2017 Two-view lumbar spine: No acute fractures. Grade 1 L4 on 5. Otherwise disc space well-maintained. No bony abnormalities otherwise.    PMFS History: Patient Active Problem List   Diagnosis Date Noted  . Chronic low back pain without sciatica 06/18/2017  .  Primary osteoarthritis of right knee 03/26/2017  . Presence of left artificial knee joint 02/26/2017  . Unilateral primary osteoarthritis, left knee 01/12/2017  . 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/12/2017  . Renal mass 12/22/2015   Past Medical History:  Diagnosis Date  . Arthritis   . Cancer ([place])    Left renal mass  . History of kidney stones    x3 -remains with one on right.   Marland Kitchen Hx of seasonal allergies    rare flare ups  . Hypertension   . Left renal mass   . Obesity   . Pneumonia    hx of 20 years ago  . Right ureteral stone   . Type 2 diabetes mellitus (HCC)    Type 2  . Wears glasses   . Wears partial dentures    upper    Family History  Problem Relation Age of Onset  . Esophageal cancer Maternal Aunt   . Colon cancer Neg Hx   . Stomach cancer Neg Hx   . Rectal cancer Neg Hx     Past Surgical History:  Procedure Laterality Date  . CESAREAN SECTION  yrs ago  . ROBOTIC ASSITED PARTIAL NEPHRECTOMY Left 12/22/2015   Procedure: XI ROBOTIC ASSITED PARTIAL NEPHRECTOMY;  Surgeon: Alexis Frock, MD;  Location: [facility];  Service: Urology;  Laterality: Left;  . TOTAL ABDOMINAL HYSTERECTOMY W/ BILATERAL SALPINGOOPHORECTOMY  1980's  . TOTAL KNEE ARTHROPLASTY Left 01/12/2017   Procedure: LEFT TOTAL KNEE ARTHROPLASTY and Right knee steroid injection;  Surgeon: Mcarthur Rossetti, MD;  Location: [facility];  Service: Orthopedics;  Laterality: Left;   Social History   Occupational History  . Not on file.   Social History Main Topics  . Smoking status: Never Smoker  . Smokeless tobacco: Never Used  . Alcohol use No  . Drug use: No  . Sexual activity: Not Currently                                                                   ----------

## 2017-07-16 ENCOUNTER — Ambulatory Visit (INDEPENDENT_AMBULATORY_CARE_PROVIDER_SITE_OTHER): Payer: Medicare Other | Admitting: Physician Assistant

## 2017-07-16 ENCOUNTER — Encounter (INDEPENDENT_AMBULATORY_CARE_PROVIDER_SITE_OTHER): Payer: Self-pay | Admitting: Physician Assistant

## 2017-07-16 DIAGNOSIS — M545 Low back pain: Secondary | ICD-10-CM

## 2017-07-16 DIAGNOSIS — M1711 Unilateral primary osteoarthritis, right knee: Secondary | ICD-10-CM | POA: Diagnosis not present

## 2017-07-16 DIAGNOSIS — G8929 Other chronic pain: Secondary | ICD-10-CM

## 2017-07-16 MED ORDER — DICLOFENAC SODIUM 2 % TD SOLN: 2.0000 | Freq: Two times a day (BID) | TRANSDERMAL | 1 refills | Status: DC

## 2017-07-16 NOTE — Progress Notes (Signed)
Office Visit Note   Patient: Debbie Velasquez           Date of Birth: 08/05/1951           MRN: [id number] Visit Date: 07/16/2017              Requested by: Nolene Ebbs, MD [address] [place], [place] [address] PCP: Nolene Ebbs, MD   Assessment & Plan: Visit Diagnoses:  1. Primary osteoarthritis of right knee   2. Chronic right-sided low back pain without sciatica     Plan: She'll continue her physical therapy for back transitioning to a home exercise program. She'll follow up with [place] in regards to the back if her pain becomes worse or if she starts having radicular symptoms down either leg. Regards to her right knee arthritis she did like to have an mom of disc injection in December she can call early December for [place] to get approval for this given late December when it has been total of 6 months since the last injection. She may benefit from a interim cortisone injection in the knee if her pain becomes worse. Her Voltaren gel was ordered but she did not receive this will check into this and see if we can obtain Voltaren gel for her right knee arthritis.  Follow-Up Instructions: No Follow-up on file.   Orders:  No orders of the defined types were placed in this encounter.  Meds ordered this encounter  Medications  . Diclofenac Sodium 2 % SOLN    Sig: Place 2 Squirts onto the skin 2 (two) times daily.    Dispense:  1 Bottle    Refill:  1      Procedures: No procedures performed   Clinical Data: No additional findings.   Subjective: Chief Complaint  Patient presents with  . Lower Back - Follow-up  . Right Knee - Follow-up  . Left Knee - Follow-up    HPI Debbie Velasquez returns today follow-up of her low back pain and bilateral knees. She status post left total knee arthroplasty 6 months. She has known osteoarthritis of the right knee had a mono this injection in the right knee on 04/16/2017. She states that her back pain has improved greatly. He has  no radicular symptoms down either leg She's happy with left total knee. Her right knee is starting bother her some but she does feel mono viscus helped. Review of Systems No fevers chills shortness breath chest pain.  Objective: Vital Signs: There were no vitals taken for this visit.  Physical Exam  Constitutional: She is oriented to person, place, and time. She appears well-developed and well-nourished. No distress.  Pulmonary/Chest: Effort normal.  Neurological: She is alert and oriented to person, place, and time.  Skin: She is not diaphoretic.    Ortho Exam Lumbar spine she has some tenderness over the lower lumbar spine and lower paraspinous region bilaterally. She has full flexion. No real pain today with extension of the lumbar spine. Negative straight leg raise bilaterally. Specialty Comments:  No specialty comments available.  Imaging: No results found.   PMFS History: Patient Active Problem List   Diagnosis Date Noted  . Chronic low back pain without sciatica 06/18/2017  . Primary osteoarthritis of right knee 03/26/2017  . Presence of left artificial knee joint 02/26/2017  . Unilateral primary osteoarthritis, left knee 01/12/2017  . 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/12/2017  . Renal mass 12/22/2015   Past Medical History:  Diagnosis Date  .  Arthritis   . Cancer ([place])    Left renal mass  . History of kidney stones    x3 -remains with one on right.   Marland Kitchen Hx of seasonal allergies    rare flare ups  . Hypertension   . Left renal mass   . Obesity   . Pneumonia    hx of 20 years ago  . Right ureteral stone   . Type 2 diabetes mellitus (HCC)    Type 2  . Wears glasses   . Wears partial dentures    upper    Family History  Problem Relation Age of Onset  . Esophageal cancer Maternal Aunt   . Colon cancer Neg Hx   . Stomach cancer Neg Hx   . Rectal cancer Neg Hx     Past Surgical History:  Procedure Laterality Date  . CESAREAN SECTION  yrs ago  .  ROBOTIC ASSITED PARTIAL NEPHRECTOMY Left 12/22/2015   Procedure: XI ROBOTIC ASSITED PARTIAL NEPHRECTOMY;  Surgeon: Alexis Frock, MD;  Location: [facility];  Service: Urology;  Laterality: Left;  . TOTAL ABDOMINAL HYSTERECTOMY W/ BILATERAL SALPINGOOPHORECTOMY  1980's  . TOTAL KNEE ARTHROPLASTY Left 01/12/2017   Procedure: LEFT TOTAL KNEE ARTHROPLASTY and Right knee steroid injection;  Surgeon: Mcarthur Rossetti, MD;  Location: [facility];  Service: Orthopedics;  Laterality: Left;   Social History   Occupational History  . Not on file.   Social History Main Topics  . Smoking status: Never Smoker  . Smokeless tobacco: Never Used  . Alcohol use No  . Drug use: No  . Sexual activity: Not Currently

## 2017-08-23 ENCOUNTER — Emergency Department (HOSPITAL_COMMUNITY)
Admission: EM | Admit: 2017-08-23 | Discharge: 2017-08-23 | Disposition: A | Discharge status: Home / Self Care | Payer: Medicare Other | Attending: Emergency Medicine | Admitting: Emergency Medicine

## 2017-08-23 ENCOUNTER — Encounter (HOSPITAL_COMMUNITY): Payer: Self-pay

## 2017-08-23 DIAGNOSIS — I10 Essential (primary) hypertension: Secondary | ICD-10-CM | POA: Insufficient documentation

## 2017-08-23 DIAGNOSIS — E119 Type 2 diabetes mellitus without complications: Secondary | ICD-10-CM | POA: Insufficient documentation

## 2017-08-23 DIAGNOSIS — R2241 Localized swelling, mass and lump, right lower limb: Secondary | ICD-10-CM | POA: Diagnosis present

## 2017-08-23 DIAGNOSIS — Z79899 Other long term (current) drug therapy: Secondary | ICD-10-CM | POA: Diagnosis not present

## 2017-08-23 DIAGNOSIS — L03115 Cellulitis of right lower limb: Secondary | ICD-10-CM | POA: Insufficient documentation

## 2017-08-23 DIAGNOSIS — Z7984 Long term (current) use of oral hypoglycemic drugs: Secondary | ICD-10-CM | POA: Diagnosis not present

## 2017-08-23 DIAGNOSIS — Z7982 Long term (current) use of aspirin: Secondary | ICD-10-CM | POA: Insufficient documentation

## 2017-08-23 MED ORDER — SULFAMETHOXAZOLE-TRIMETHOPRIM 800-160 MG PO TABS: 1.0000 | ORAL_TABLET | Freq: Two times a day (BID) | ORAL | 0 refills | Status: AC

## 2017-08-23 NOTE — Discharge Instructions (Signed)
If redness extends out of the markings or you have fevers or leg swelling please return to the ED.

## 2017-08-23 NOTE — ED Triage Notes (Signed)
Pt states noticed redness and swelling to Right lower leg yesterday. Denies injury or nsect bite. EDPA present at bedside evaluating pt. Family present

## 2017-08-23 NOTE — ED Provider Notes (Signed)
[facility] DEPT Provider Note   CSN: [id number] Arrival date & time: 08/23/17  1624     History   Chief Complaint Chief Complaint  Patient presents with  . Leg Swelling    HPI Debbie Velasquez is a 66 y.o. female.  Debbie Velasquez is a 66 y.o. Female who presents to the emergency department complaining of an area of swelling and redness to her right shin beginning about 3 days ago.  Patient reports she first noticed the symptoms about 3 days ago with a small area of redness to her right anterior shin.  Some slight overlying swelling to the shin. She denies any calf pain or swelling to the rest of her leg. She denies any known injury or insect bite to the area.  No treatments prior to arrival.  She denies recent long travel or exogenous estrogen use.  No history of DVT or PE. No recent surgery or immobilization. No other rashes noted. No fevers, coughing, shortness of breath, chest pain.    The history is provided by the patient, medical records and a relative. No language interpreter was used.    Past Medical History:  Diagnosis Date  . Arthritis   . Cancer ([place])    Left renal mass  . History of kidney stones    x3 -remains with one on right.   Marland Kitchen Hx of seasonal allergies    rare flare ups  . Hypertension   . Left renal mass   . Obesity   . Pneumonia    hx of 20 years ago  . Right ureteral stone   . Type 2 diabetes mellitus (HCC)    Type 2  . Wears glasses   . Wears partial dentures    upper    Patient Active Problem List   Diagnosis Date Noted  . Chronic low back pain without sciatica 06/18/2017  . Primary osteoarthritis of right knee 03/26/2017  . Presence of left artificial knee joint 02/26/2017  . Unilateral primary osteoarthritis, left knee 01/12/2017  . 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/12/2017  . Renal mass 12/22/2015    Past Surgical History:  Procedure Laterality Date  . CESAREAN SECTION  yrs ago  .  ROBOTIC ASSITED PARTIAL NEPHRECTOMY Left 12/22/2015   Procedure: XI ROBOTIC ASSITED PARTIAL NEPHRECTOMY;  Surgeon: Alexis Frock, MD;  Location: [facility];  Service: Urology;  Laterality: Left;  . TOTAL ABDOMINAL HYSTERECTOMY W/ BILATERAL SALPINGOOPHORECTOMY  1980's  . TOTAL KNEE ARTHROPLASTY Left 01/12/2017   Procedure: LEFT TOTAL KNEE ARTHROPLASTY and Right knee steroid injection;  Surgeon: Mcarthur Rossetti, MD;  Location: [facility];  Service: Orthopedics;  Laterality: Left;    OB History    No data available       Home Medications    Prior to Admission medications   Medication Sig Start Date End Date Taking? Authorizing Provider  acetaminophen (TYLENOL) 500 MG tablet Take 1,000 mg by mouth every 6 (six) hours as needed for mild pain or headache.   Yes [provider]  aspirin 81 MG chewable tablet Chew 1 tablet (81 mg total) by mouth 2 (two) times daily. Patient taking differently: Chew 81 mg by mouth daily.  01/15/17  Yes Mcarthur Rossetti, MD  atorvastatin (LIPITOR) 10 MG tablet Take 1 tablet by mouth daily. 02/13/17  Yes [provider]  losartan-hydrochlorothiazide (HYZAAR) 100-12.5 MG tablet Take 1 tablet by mouth daily.  08/23/15  Yes [provider]  meloxicam (MOBIC) 7.5 MG  tablet Take 1 tablet (7.5 mg total) by mouth daily as needed. Patient taking differently: Take 7.5 mg by mouth daily as needed for pain.  06/18/17  Yes Pete Pelt, PA-C  metFORMIN (GLUCOPHAGE) 500 MG tablet Take 500 mg by mouth daily with breakfast.    Yes [provider]  methocarbamol (ROBAXIN) 500 MG tablet Take 1 tablet (500 mg total) by mouth daily as needed. Patient taking differently: Take 500 mg by mouth daily as needed for muscle spasms.  06/18/17  Yes Pete Pelt, PA-C  cephALEXin (KEFLEX) 500 MG capsule Take 1 capsule (500 mg total) by mouth 2 (two) times daily. Patient not taking: Reported on 05/26/2017 04/05/17   Molpus, John, MD  Diclofenac Sodium 2  % SOLN Place 2 Squirts onto the skin 2 (two) times daily. Patient not taking: Reported on 08/23/2017 07/16/17   Pete Pelt, PA-C  gabapentin (NEURONTIN) 100 MG capsule TK ONE C PO QHS 07/24/17   [provider]  sulfamethoxazole-trimethoprim (BACTRIM DS,SEPTRA DS) 800-160 MG tablet Take 1 tablet by mouth 2 (two) times daily. 08/23/17 08/30/17  Waynetta Pean, PA-C  Vitamin D, Ergocalciferol, (DRISDOL) 50000 units CAPS capsule TK ONE C PO ONCE A WK 08/06/17   [provider]    Family History Family History  Problem Relation Age of Onset  . Esophageal cancer Maternal Aunt   . Colon cancer Neg Hx   . Stomach cancer Neg Hx   . Rectal cancer Neg Hx     Social History Social History  Substance Use Topics  . Smoking status: Never Smoker  . Smokeless tobacco: Never Used  . Alcohol use No     Allergies   Patient has no known allergies.   Review of Systems Review of Systems  Constitutional: Negative for chills and fever.  HENT: Negative for congestion and sore throat.   Eyes: Negative for visual disturbance.  Respiratory: Negative for cough, shortness of breath and wheezing.   Cardiovascular: Negative for chest pain and leg swelling.  Gastrointestinal: Negative for abdominal pain, diarrhea, nausea and vomiting.  Genitourinary: Negative for dysuria.  Musculoskeletal: Negative for arthralgias, back pain, joint swelling and neck pain.  Skin: Positive for rash.  Neurological: Negative for weakness, numbness and headaches.     Physical Exam Updated Vital Signs BP (!) 159/63 (BP Location: Right Arm)   Pulse 76   Temp 98.1 F (36.7 C) (Oral)   Resp 18   Ht 5\' 4"  (1.626 m)   Wt 115.7 kg (255 lb)   SpO2 100%   BMI 43.77 kg/m   Physical Exam  Constitutional: She appears well-developed and well-nourished. No distress.  Nontoxic-appearing.  HENT:  Head: Normocephalic and atraumatic.  Eyes: Conjunctivae are normal. Right eye exhibits no discharge. Left eye  exhibits no discharge.  Neck: Neck supple.  Cardiovascular: Normal rate, regular rhythm, normal heart sounds and intact distal pulses.   Bilateral radial, posterior tibialis and dorsalis pedis pulses are intact.    Pulmonary/Chest: Effort normal and breath sounds normal. No respiratory distress.  Lungs are clear to ascultation bilaterally. Symmetric chest expansion bilaterally. No increased work of breathing. No rales or rhonchi.    Abdominal: Soft. There is no tenderness.  Musculoskeletal: Normal range of motion. She exhibits no edema, tenderness or deformity.  About 5x5 cm area of slight erythema noted to her right anterior shin that is marked with skin marker by RN at bedside. No edema noted. No ecchymosis, vesicles.  No discharge.  No calf edema  or tenderness.  No palpable cords.  No pedal edema bilaterally. Good ROM of her legs without pain or difficulty.   Neurological: She is alert. No sensory deficit. She exhibits normal muscle tone. Coordination normal.  Skin: Skin is warm and dry. Capillary refill takes less than 2 seconds. No rash noted. She is not diaphoretic. There is erythema. No pallor.  See musculoskeletal.  Psychiatric: She has a normal mood and affect. Her behavior is normal.  Nursing note and vitals reviewed.    ED Treatments / Results  Labs (all labs ordered are listed, but only abnormal results are displayed) Labs Reviewed - No data to display  EKG  EKG Interpretation None       Radiology No results found.  Procedures Procedures (including critical care time)  Medications Ordered in ED Medications - No data to display   Initial Impression / Assessment and Plan / ED Course  I have reviewed the triage vital signs and the nursing notes.  Pertinent labs & imaging results that were available during my care of the patient were reviewed by me and considered in my medical decision making (see chart for details).    This is a 66 y.o. Female who presents to the  emergency department complaining of an area of swelling and redness to her right shin beginning about 3 days ago.  Patient reports she first noticed the symptoms about 3 days ago with a small area of redness to her right anterior shin.  Some slight overlying swelling to the shin. She denies any calf pain or swelling to the rest of her leg. She denies any known injury or insect bite to the area.  No treatments prior to arrival.  She denies recent long travel or exogenous estrogen use.  No history of DVT or PE. No recent surgery or immobilization. No DVT risk factors noted.  On exam the patient is afebrile and non-toxic appearing.  She has about a 5 x 5 cm area of slight erythema noted to her right anterior shin.  No edema, vesicles or discharge noted.  No calf edema or tenderness.  No palpable cords.  She is neurovascularly intact.  No identifiable DVT risk factors.  I have low suspicion for DVT in this patient.  This appears to be simple overlying cellulitis.  Will treat with Bactrim and have her watch this closely.  Her area of erythema was marked with a skin marker by RN at bedside.  I discussed strict and specific return precautions.  I advised if she has developing leg swelling, fevers, discharge from the area, or worsening redness she needs to return for reevaluation. I advised the patient to follow-up with their primary care provider this week. I advised the patient to return to the emergency department with new or worsening symptoms or new concerns. The patient verbalized understanding and agreement with plan.     Final Clinical Impressions(s) / ED Diagnoses   Final diagnoses:  Cellulitis of right lower extremity    New Prescriptions New Prescriptions   SULFAMETHOXAZOLE-TRIMETHOPRIM (BACTRIM DS,SEPTRA DS) 800-160 MG TABLET    Take 1 tablet by mouth 2 (two) times daily.     Waynetta Pean, PA-C 08/23/17 1733    Malvin Johns, MD 08/23/17 [phone]

## 2017-11-17 ENCOUNTER — Encounter (HOSPITAL_COMMUNITY): Payer: Self-pay | Admitting: *Deleted

## 2017-11-17 ENCOUNTER — Other Ambulatory Visit: Payer: Self-pay

## 2017-11-17 ENCOUNTER — Emergency Department (HOSPITAL_COMMUNITY)
Admission: EM | Admit: 2017-11-17 | Discharge: 2017-11-17 | Disposition: A | Discharge status: Home / Self Care | Payer: Medicare Other | Attending: Emergency Medicine | Admitting: Emergency Medicine

## 2017-11-17 ENCOUNTER — Emergency Department (HOSPITAL_COMMUNITY): Payer: Medicare Other

## 2017-11-17 DIAGNOSIS — R1013 Epigastric pain: Secondary | ICD-10-CM | POA: Diagnosis not present

## 2017-11-17 DIAGNOSIS — E119 Type 2 diabetes mellitus without complications: Secondary | ICD-10-CM | POA: Insufficient documentation

## 2017-11-17 DIAGNOSIS — R110 Nausea: Secondary | ICD-10-CM | POA: Diagnosis not present

## 2017-11-17 DIAGNOSIS — Z96652 Presence of left artificial knee joint: Secondary | ICD-10-CM | POA: Insufficient documentation

## 2017-11-17 DIAGNOSIS — I10 Essential (primary) hypertension: Secondary | ICD-10-CM | POA: Insufficient documentation

## 2017-11-17 DIAGNOSIS — Z7982 Long term (current) use of aspirin: Secondary | ICD-10-CM | POA: Insufficient documentation

## 2017-11-17 DIAGNOSIS — Z7984 Long term (current) use of oral hypoglycemic drugs: Secondary | ICD-10-CM | POA: Diagnosis not present

## 2017-11-17 LAB — URINALYSIS, ROUTINE W REFLEX MICROSCOPIC
BILIRUBIN URINE: NEGATIVE
Glucose, UA: NEGATIVE mg/dL
HGB URINE DIPSTICK: NEGATIVE
KETONES UR: NEGATIVE mg/dL
Nitrite: NEGATIVE
PROTEIN: NEGATIVE mg/dL
SPECIFIC GRAVITY, URINE: 1.015 (ref 1.005–1.030)
SQUAMOUS EPITHELIAL / LPF: NONE SEEN
pH: 5 (ref 5.0–8.0)

## 2017-11-17 LAB — CBC
HEMATOCRIT: 37.6 % (ref 36.0–46.0)
HEMOGLOBIN: 11.8 g/dL — AB (ref 12.0–15.0)
MCH: 27.4 pg (ref 26.0–34.0)
MCHC: 31.4 g/dL (ref 30.0–36.0)
MCV: 87.4 fL (ref 78.0–100.0)
Platelets: 349 10*3/uL (ref 150–400)
RBC: 4.3 MIL/uL (ref 3.87–5.11)
RDW: 13.8 % (ref 11.5–15.5)
WBC: 11.3 10*3/uL — AB (ref 4.0–10.5)

## 2017-11-17 LAB — COMPREHENSIVE METABOLIC PANEL
ALT: 12 U/L — ABNORMAL LOW (ref 14–54)
AST: 19 U/L (ref 15–41)
Albumin: 4.1 g/dL (ref 3.5–5.0)
Alkaline Phosphatase: 89 U/L (ref 38–126)
Anion gap: 13 (ref 5–15)
BUN: 17 mg/dL (ref 6–20)
CHLORIDE: 103 mmol/L (ref 101–111)
CO2: 23 mmol/L (ref 22–32)
Calcium: 9.7 mg/dL (ref 8.9–10.3)
Creatinine, Ser: 1 mg/dL (ref 0.44–1.00)
GFR, EST NON AFRICAN AMERICAN: 57 mL/min — AB (ref >60)
Glucose, Bld: 91 mg/dL (ref 65–99)
POTASSIUM: 3.6 mmol/L (ref 3.5–5.1)
SODIUM: 139 mmol/L (ref 135–145)
Total Bilirubin: 0.4 mg/dL (ref 0.3–1.2)
Total Protein: 8.9 g/dL — ABNORMAL HIGH (ref 6.5–8.1)

## 2017-11-17 LAB — LIPASE, BLOOD: LIPASE: 54 U/L — AB (ref 11–51)

## 2017-11-17 LAB — I-STAT TROPONIN, ED: TROPONIN I, POC: 0 ng/mL (ref 0.00–0.08)

## 2017-11-17 MED ORDER — ONDANSETRON HCL 4 MG PO TABS: 4.0000 mg | ORAL_TABLET | Freq: Three times a day (TID) | ORAL | 0 refills | Status: DC | PRN

## 2017-11-17 MED ORDER — OMEPRAZOLE 20 MG PO CPDR: 20.0000 mg | DELAYED_RELEASE_CAPSULE | Freq: Every day | ORAL | 0 refills | Status: DC

## 2017-11-17 MED ORDER — GI COCKTAIL ~~LOC~~
30.0000 mL | Freq: Once | ORAL | Status: AC
  Administered 2017-11-17: 30 mL via ORAL
  Filled 2017-11-17: qty 30

## 2017-11-17 NOTE — Discharge Instructions (Signed)
Your workup today showed no evidence of pneumonia or cardiac cause of your symptoms.  I suspect you are having reflux given the relation to the food she weighed.  Please use the medication to help with your likely reflux and use the nausea medicine if you are nauseous.  Please stay hydrated.  Please avoid spicy foods.  Please follow-up with your primary care physician in the next several days for reassessment.  If any symptoms change or worsen, please return to the nearest emergency department.

## 2017-11-17 NOTE — ED Provider Notes (Signed)
[facility] DEPT Provider Note   CSN: [id number] Arrival date & time: 11/17/17  1608     History   Chief Complaint Chief Complaint  Patient presents with  . Abdominal Pain    HPI Debbie Velasquez is a 67 y.o. female.  The history is provided by the patient and medical records. No language interpreter was used.  Abdominal Pain   This is a new problem. The current episode started 6 to 12 hours ago. The problem occurs constantly. The problem has been rapidly improving. The pain is associated with eating. The pain is located in the epigastric region. The quality of the pain is burning. The pain is at a severity of 1/10. The pain is mild. Associated symptoms include nausea. Pertinent negatives include fever, belching, diarrhea, flatus, hematochezia, melena, vomiting, constipation, dysuria, frequency, hematuria and myalgias. The symptoms are aggravated by eating. Nothing relieves the symptoms. Her past medical history is significant for GERD. Her past medical history does not include gallstones or Crohn's disease.    Past Medical History:  Diagnosis Date  . Arthritis   . Cancer ([place])    Left renal mass  . History of kidney stones    x3 -remains with one on right.   Marland Kitchen Hx of seasonal allergies    rare flare ups  . Hypertension   . Left renal mass   . Obesity   . Pneumonia    hx of 20 years ago  . Right ureteral stone   . Type 2 diabetes mellitus (HCC)    Type 2  . Wears glasses   . Wears partial dentures    upper    Patient Active Problem List   Diagnosis Date Noted  . Chronic low back pain without sciatica 06/18/2017  . Primary osteoarthritis of right knee 03/26/2017  . Presence of left artificial knee joint 02/26/2017  . Unilateral primary osteoarthritis, left knee 01/12/2017  . 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/12/2017  . Renal mass 12/22/2015    Past Surgical History:  Procedure Laterality Date  . CESAREAN SECTION  yrs  ago  . ROBOTIC ASSITED PARTIAL NEPHRECTOMY Left 12/22/2015   Procedure: XI ROBOTIC ASSITED PARTIAL NEPHRECTOMY;  Surgeon: Alexis Frock, MD;  Location: [facility];  Service: Urology;  Laterality: Left;  . TOTAL ABDOMINAL HYSTERECTOMY W/ BILATERAL SALPINGOOPHORECTOMY  1980's  . TOTAL KNEE ARTHROPLASTY Left 01/12/2017   Procedure: LEFT TOTAL KNEE ARTHROPLASTY and Right knee steroid injection;  Surgeon: Mcarthur Rossetti, MD;  Location: [facility];  Service: Orthopedics;  Laterality: Left;    OB History    No data available       Home Medications    Prior to Admission medications   Medication Sig Start Date End Date Taking? Authorizing Provider  acetaminophen (TYLENOL) 500 MG tablet Take 1,000 mg by mouth every 6 (six) hours as needed for mild pain or headache.    [provider]  aspirin 81 MG chewable tablet Chew 1 tablet (81 mg total) by mouth 2 (two) times daily. Patient taking differently: Chew 81 mg by mouth daily.  01/15/17   Mcarthur Rossetti, MD  atorvastatin (LIPITOR) 10 MG tablet Take 1 tablet by mouth daily. 02/13/17   [provider]  cephALEXin (KEFLEX) 500 MG capsule Take 1 capsule (500 mg total) by mouth 2 (two) times daily. Patient not taking: Reported on 05/26/2017 04/05/17   Molpus, John, MD  Diclofenac Sodium 2 % SOLN Place 2 Squirts onto the skin 2 (two)  times daily. Patient not taking: Reported on 08/23/2017 07/16/17   Pete Pelt, PA-C  gabapentin (NEURONTIN) 100 MG capsule TK ONE C PO QHS 07/24/17   [provider]  losartan-hydrochlorothiazide (HYZAAR) 100-12.5 MG tablet Take 1 tablet by mouth daily.  08/23/15   [provider]  meloxicam (MOBIC) 7.5 MG tablet Take 1 tablet (7.5 mg total) by mouth daily as needed. Patient taking differently: Take 7.5 mg by mouth daily as needed for pain.  06/18/17   Pete Pelt, PA-C  metFORMIN (GLUCOPHAGE) 500 MG tablet Take 500 mg by mouth daily with breakfast.     [provider]    methocarbamol (ROBAXIN) 500 MG tablet Take 1 tablet (500 mg total) by mouth daily as needed. Patient taking differently: Take 500 mg by mouth daily as needed for muscle spasms.  06/18/17   Pete Pelt, PA-C  Vitamin D, Ergocalciferol, (DRISDOL) 50000 units CAPS capsule TK ONE C PO ONCE A WK 08/06/17   [provider]    Family History Family History  Problem Relation Age of Onset  . Esophageal cancer Maternal Aunt   . Colon cancer Neg Hx   . Stomach cancer Neg Hx   . Rectal cancer Neg Hx     Social History Social History   Tobacco Use  . Smoking status: Never Smoker  . Smokeless tobacco: Never Used  Substance Use Topics  . Alcohol use: No    Alcohol/week: 0.0 oz  . Drug use: No     Allergies   Patient has no known allergies.   Review of Systems Review of Systems  Constitutional: Negative for chills, diaphoresis, fatigue and fever.  HENT: Negative for congestion.   Eyes: Negative for visual disturbance.  Respiratory: Positive for cough. Negative for chest tightness, shortness of breath and wheezing.   Cardiovascular: Negative for chest pain, palpitations and leg swelling.  Gastrointestinal: Positive for abdominal pain and nausea. Negative for constipation, diarrhea, flatus, hematochezia, melena and vomiting.  Genitourinary: Negative for dysuria, enuresis, flank pain, frequency and hematuria.  Musculoskeletal: Negative for back pain and myalgias.  Skin: Negative for rash and wound.  Psychiatric/Behavioral: Negative for agitation.  All other systems reviewed and are negative.    Physical Exam Updated Vital Signs BP (!) 145/77 (BP Location: Left Arm)   Pulse 92   Temp 98.4 F (36.9 C) (Oral)   Resp 14   Ht 5\' 4"  (1.626 m)   Wt 118.8 kg (262 lb)   SpO2 100%   BMI 44.97 kg/m   Physical Exam  Constitutional: She is oriented to person, place, and time. She appears well-developed and well-nourished. No distress.  HENT:  Head: Normocephalic and  atraumatic.  Right Ear: External ear normal.  Left Ear: External ear normal.  Nose: Nose normal.  Mouth/Throat: Oropharynx is clear and moist. No oropharyngeal exudate.  Eyes: Conjunctivae and EOM are normal. Pupils are equal, round, and reactive to light.  Neck: Normal range of motion. Neck supple.  Cardiovascular: Normal rate and intact distal pulses.  No murmur heard. Pulmonary/Chest: Effort normal. No stridor. No respiratory distress. She has no wheezes. She has no rales. She exhibits no tenderness.  Abdominal: She exhibits no distension. There is no tenderness. There is no rebound.  Neurological: She is alert and oriented to person, place, and time. She has normal reflexes. No sensory deficit. She exhibits normal muscle tone. Coordination normal.  Skin: Skin is warm. No rash noted. She is not diaphoretic. No erythema.  Psychiatric: She  has a normal mood and affect.  Nursing note and vitals reviewed.    ED Treatments / Results  Labs (all labs ordered are listed, but only abnormal results are displayed) Labs Reviewed  LIPASE, BLOOD - Abnormal; Notable for the following components:      Result Value   Lipase 54 (*)    All other components within normal limits  COMPREHENSIVE METABOLIC PANEL - Abnormal; Notable for the following components:   Total Protein 8.9 (*)    ALT 12 (*)    GFR calc non Af Amer 57 (*)    All other components within normal limits  CBC - Abnormal; Notable for the following components:   WBC 11.3 (*)    Hemoglobin 11.8 (*)    All other components within normal limits  URINALYSIS, ROUTINE W REFLEX MICROSCOPIC - Abnormal; Notable for the following components:   APPearance CLOUDY (*)    Leukocytes, UA SMALL (*)    Bacteria, UA RARE (*)    All other components within normal limits  I-STAT TROPONIN, ED    EKG  EKG Interpretation  Date/Time:  Saturday November 17 2017 16:19:43 EST Ventricular Rate:  90 PR Interval:    QRS Duration: 92 QT  Interval:  361 QTC Calculation: 442 R Axis:   22 Text Interpretation:  Sinus rhythm Probable left atrial enlargement Abnormal R-wave progression, early transition When compared to prior, no significant changes seen.  No STEMI Confirmed by Antony Blackbird [phone]) on 11/17/2017 10:41:14 PM       Radiology Dg Chest 2 View  Result Date: 11/17/2017 CLINICAL DATA:  Productive cough and epigastric pain beginning yesterday. EXAM: CHEST  2 VIEW COMPARISON:  07/06/2016 FINDINGS: The heart size and mediastinal contours are within normal limits. Both lungs are clear. The visualized skeletal structures are unremarkable. IMPRESSION: No active cardiopulmonary disease. Electronically Signed   By: Earle Gell M.D.   On: 11/17/2017 21:20    Procedures Procedures (including critical care time)  Medications Ordered in ED Medications  gi cocktail (Maalox,Lidocaine,Donnatal) (30 mLs Oral Given 11/17/17 2130)     Initial Impression / Assessment and Plan / ED Course  I have reviewed the triage vital signs and the nursing notes.  Pertinent labs & imaging results that were available during my care of the patient were reviewed by me and considered in my medical decision making (see chart for details).     Debbie Velasquez is a 67 y.o. female past medical history significant for hypertension, diabetes, obesity, kidney stones, and prior renal cancer status post resection who presents with epigastric abdominal pain and nausea.  Patient reports that her symptoms have been ongoing since earlier today.  She reports similar symptoms previously and thought it was indigestion.  She denies chest discomfort but does point to her very lower chest and epigastrium as the area of discomfort.  She describes it as mild to moderate.  She reports some nausea.  She says that it was a burning type pain and does not radiate.  She reports it was worsened after she ate spicy [place] food.  She reports it feels like a gas pain.  He  denies fevers, chills, vomiting, diaphoresis, shortness of breath, palpitations or lightheadedness.  She denies recent trauma.  She denies any urinary symptoms or GI symptoms.  She denies any history of cardiac disease, recent internal and no history of DVT/PE.  Patient reports that her pain is nearly resolved on arrival.  On exam, abdomen has very minimal tenderness in  the epigastric area.  On reassessment no tenderness was felt.  Lungs clear.  No murmurs.  Chest is nontender.  Back is nontender.  No edema in the legs.  Overall unremarkable physical exam.  EKG showed no STEMI.  Chest x-ray shows no pneumonia.  Patient did report that she had a very dry cough.  Patient had laboratory testing which showed negative troponin.  Metabolic panel overall reassuring.  Lipase slightly elevated and patient had mild leukocytosis.  Similar anemia to prior.  Patient denied any urinary symptoms, in the absence of nitrites, doubt UTI.  Suspect reflux as the cause of the patient's burning epigastric discomfort after her spicy food.  This feels similar to prior episodes of indigestion.  Patient given GI cocktail with resolution of symptoms.  Do not feel patient is having a cardiac etiology or pulmonary etiology of symptoms.  Patient will be started on omeprazole and will follow up with PCP in several days.  Patient understood return precautions and follow-up instructions for any new or worsening symptoms.  Patient voiced understanding of the plan of care and was discharged in good condition.   Final Clinical Impressions(s) / ED Diagnoses   Final diagnoses:  Epigastric pain  Nausea    ED Discharge Orders        Ordered    omeprazole (PRILOSEC) 20 MG capsule  Daily     11/17/17 2300    ondansetron (ZOFRAN) 4 MG tablet  Every 8 hours PRN     11/17/17 2300     Clinical Impression: 1. Epigastric pain   2. Nausea     Disposition: Discharge  Condition: Good  I have discussed the results, Dx and Tx  plan with the pt(& family if present). He/she/they expressed understanding and agree(s) with the plan. Discharge instructions discussed at great length. Strict return precautions discussed and pt &/or family have verbalized understanding of the instructions. No further questions at time of discharge.    Discharge Medication List as of 11/17/2017 11:02 PM    START taking these medications   Details  omeprazole (PRILOSEC) 20 MG capsule Take 1 capsule (20 mg total) by mouth daily., Starting Sat 11/17/2017, Print    ondansetron (ZOFRAN) 4 MG tablet Take 1 tablet (4 mg total) by mouth every 8 (eight) hours as needed for nausea or vomiting., Starting Sat 11/17/2017, Print        Follow Up: Nolene Ebbs, MD [place] [address] [place] DEPT [place] [id number] [address] [place]       Tegeler, Gwenyth Allegra, MD 11/18/17 [phone]

## 2017-11-17 NOTE — ED Triage Notes (Signed)
Pt states she has pain in her lower chest area, places hand on epigastric area. States she has had it before and was told it was indigestion. No other symptoms associated with complaint.

## 2017-12-24 ENCOUNTER — Ambulatory Visit (INDEPENDENT_AMBULATORY_CARE_PROVIDER_SITE_OTHER): Payer: Medicare Other | Admitting: Physician Assistant

## 2017-12-24 ENCOUNTER — Encounter (INDEPENDENT_AMBULATORY_CARE_PROVIDER_SITE_OTHER): Payer: Self-pay | Admitting: Physician Assistant

## 2017-12-24 DIAGNOSIS — M1711 Unilateral primary osteoarthritis, right knee: Secondary | ICD-10-CM | POA: Diagnosis not present

## 2017-12-24 MED ORDER — LIDOCAINE HCL 1 % IJ SOLN
3.0000 mL | INTRAMUSCULAR | Status: AC | PRN
  Administered 2017-12-24: 3 mL

## 2017-12-24 MED ORDER — METHYLPREDNISOLONE ACETATE 40 MG/ML IJ SUSP
40.0000 mg | INTRAMUSCULAR | Status: AC | PRN
  Administered 2017-12-24: 40 mg via INTRA_ARTICULAR

## 2017-12-24 NOTE — Progress Notes (Signed)
Office Visit Note   Patient: Debbie Velasquez           Date of Birth: [birth year]           MRN: [id number] Visit Date: 12/24/2017              Requested by: Nolene Ebbs, MD [address] [place], [place] [address] PCP: Nolene Ebbs, MD   Assessment & Plan: Visit Diagnoses:  1. Primary osteoarthritis of right knee     Plan: Ms. Gambrell will see how the injection does for her.  If she decides she wants a Monovisc injection she will call and let us know.  Did remind her that she could have cortisone injections as often as every 3 months and Monovisc injections as often as every 6 months.  However she does need to call ahead of time and given the Monovisc injections approved.  She understands this and will follow up with [place] on an as-needed basis.  Follow-Up Instructions: Return if symptoms worsen or fail to improve.   Orders:  No orders of the defined types were placed in this encounter.  No orders of the defined types were placed in this encounter.     Procedures: Large Joint Inj: R knee on 12/24/2017 8:57 AM Indications: pain Details: 22 G 1.5 in needle, anterolateral approach  Arthrogram: No  Medications: 3 mL lidocaine 1 %; 40 mg methylPREDNISolone acetate 40 MG/ML Outcome: tolerated well, no immediate complications Procedure, treatment alternatives, risks and benefits explained, specific risks discussed. Consent was given by the patient. Immediately prior to procedure a time out was called to verify the correct patient, procedure, equipment, support staff and site/side marked as required. Patient was prepped and draped in the usual sterile fashion.       Clinical Data: No additional findings.   Subjective: Chief Complaint  Patient presents with  . Right Knee - Pain    HPI Debbie Velasquez returns today due to right knee pain.  She has known osteoarthritis of the right knee.  She comes in today requesting an injection in her right knee.  She has had  no new injury.  She states she has had increased pain over the past month.  States the knee does catch now and then.  She last had a steroid injection 03/26/2017 and a Monovisc injection 625 of 18. Review of Systems Please see HPI otherwise negative  Objective: Vital Signs: There were no vitals taken for this visit.  Physical Exam  Constitutional: She is oriented to person, place, and time. She appears well-developed and well-nourished. No distress.  Pulmonary/Chest: Effort normal.  Neurological: She is alert and oriented to person, place, and time.  Skin: She is not diaphoretic.  Psychiatric: She has a normal mood and affect.    Ortho Exam Left knee she has good range of motion left knee surgical incisions healed well.  She has some tenderness right peripatellar region.  No effusion abnormal warmth erythema of either knee.  Tenderness no new effusion abnormal warmth or erythema.  Right knee overall good range of motion.   Specialty Comments:  No specialty comments available.  Imaging: No results found.   PMFS History: Patient Active Problem List   Diagnosis Date Noted  . Chronic low back pain without sciatica 06/18/2017  . Primary osteoarthritis of right knee 03/26/2017  . Presence of left artificial knee joint 02/26/2017  . Unilateral primary osteoarthritis, left knee 01/12/2017  . 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/12/2017  .  Renal mass 12/22/2015   Past Medical History:  Diagnosis Date  . Arthritis   . Cancer ([place])    Left renal mass  . History of kidney stones    x3 -remains with one on right.   Marland Kitchen Hx of seasonal allergies    rare flare ups  . Hypertension   . Left renal mass   . Obesity   . Pneumonia    hx of 20 years ago  . Right ureteral stone   . Type 2 diabetes mellitus (HCC)    Type 2  . Wears glasses   . Wears partial dentures    upper    Family History  Problem Relation Age of Onset  . Esophageal cancer Maternal Aunt   . Colon cancer Neg Hx    . Stomach cancer Neg Hx   . Rectal cancer Neg Hx     Past Surgical History:  Procedure Laterality Date  . CESAREAN SECTION  yrs ago  . ROBOTIC ASSITED PARTIAL NEPHRECTOMY Left 12/22/2015   Procedure: XI ROBOTIC ASSITED PARTIAL NEPHRECTOMY;  Surgeon: Alexis Frock, MD;  Location: [facility];  Service: Urology;  Laterality: Left;  . TOTAL ABDOMINAL HYSTERECTOMY W/ BILATERAL SALPINGOOPHORECTOMY  1980's  . TOTAL KNEE ARTHROPLASTY Left 01/12/2017   Procedure: LEFT TOTAL KNEE ARTHROPLASTY and Right knee steroid injection;  Surgeon: Mcarthur Rossetti, MD;  Location: [facility];  Service: Orthopedics;  Laterality: Left;   Social History   Occupational History  . Not on file  Tobacco Use  . Smoking status: Never Smoker  . Smokeless tobacco: Never Used  Substance and Sexual Activity  . Alcohol use: No    Alcohol/week: 0.0 oz  . Drug use: No  . Sexual activity: Not Currently

## 2018-02-06 ENCOUNTER — Other Ambulatory Visit: Payer: Self-pay | Admitting: Internal Medicine

## 2018-02-06 DIAGNOSIS — Z1231 Encounter for screening mammogram for malignant neoplasm of breast: Secondary | ICD-10-CM

## 2018-03-11 ENCOUNTER — Ambulatory Visit
Admission: RE | Admit: 2018-03-11 | Discharge: 2018-03-11 | Disposition: A | Discharge status: Home / Self Care | Payer: Medicare Other | Source: Ambulatory Visit | Attending: Internal Medicine | Admitting: Internal Medicine

## 2018-03-11 DIAGNOSIS — Z1231 Encounter for screening mammogram for malignant neoplasm of breast: Secondary | ICD-10-CM

## 2018-04-27 ENCOUNTER — Other Ambulatory Visit: Payer: Self-pay

## 2018-04-27 ENCOUNTER — Emergency Department (HOSPITAL_COMMUNITY)
Admission: EM | Admit: 2018-04-27 | Discharge: 2018-04-27 | Disposition: A | Discharge status: Home / Self Care | Payer: Medicare Other | Attending: Emergency Medicine | Admitting: Emergency Medicine

## 2018-04-27 ENCOUNTER — Encounter (HOSPITAL_COMMUNITY): Payer: Self-pay

## 2018-04-27 DIAGNOSIS — E119 Type 2 diabetes mellitus without complications: Secondary | ICD-10-CM | POA: Diagnosis not present

## 2018-04-27 DIAGNOSIS — R319 Hematuria, unspecified: Secondary | ICD-10-CM | POA: Diagnosis not present

## 2018-04-27 DIAGNOSIS — I10 Essential (primary) hypertension: Secondary | ICD-10-CM | POA: Insufficient documentation

## 2018-04-27 DIAGNOSIS — Z7982 Long term (current) use of aspirin: Secondary | ICD-10-CM | POA: Diagnosis not present

## 2018-04-27 DIAGNOSIS — Z79899 Other long term (current) drug therapy: Secondary | ICD-10-CM | POA: Diagnosis not present

## 2018-04-27 DIAGNOSIS — Z85528 Personal history of other malignant neoplasm of kidney: Secondary | ICD-10-CM | POA: Insufficient documentation

## 2018-04-27 LAB — CBC
HEMATOCRIT: 36.3 % (ref 36.0–46.0)
HEMOGLOBIN: 11.2 g/dL — AB (ref 12.0–15.0)
MCH: 27.4 pg (ref 26.0–34.0)
MCHC: 30.9 g/dL (ref 30.0–36.0)
MCV: 88.8 fL (ref 78.0–100.0)
Platelets: 383 10*3/uL (ref 150–400)
RBC: 4.09 MIL/uL (ref 3.87–5.11)
RDW: 13.5 % (ref 11.5–15.5)
WBC: 11.6 10*3/uL — ABNORMAL HIGH (ref 4.0–10.5)

## 2018-04-27 LAB — URINALYSIS, ROUTINE W REFLEX MICROSCOPIC
BILIRUBIN URINE: NEGATIVE
Glucose, UA: NEGATIVE mg/dL
KETONES UR: NEGATIVE mg/dL
NITRITE: NEGATIVE
PH: 5.5 (ref 5.0–8.0)
Protein, ur: NEGATIVE mg/dL
SPECIFIC GRAVITY, URINE: 1.02 (ref 1.005–1.030)

## 2018-04-27 LAB — BASIC METABOLIC PANEL
ANION GAP: 9 (ref 5–15)
BUN: 21 mg/dL (ref 8–23)
CHLORIDE: 103 mmol/L (ref 98–111)
CO2: 28 mmol/L (ref 22–32)
Calcium: 9.3 mg/dL (ref 8.9–10.3)
Creatinine, Ser: 1.07 mg/dL — ABNORMAL HIGH (ref 0.44–1.00)
GFR calc Af Amer: >60 mL/min (ref >60)
GFR, EST NON AFRICAN AMERICAN: 52 mL/min — AB (ref >60)
Glucose, Bld: 95 mg/dL (ref 70–99)
POTASSIUM: 3.7 mmol/L (ref 3.5–5.1)
SODIUM: 140 mmol/L (ref 135–145)

## 2018-04-27 LAB — URINALYSIS, MICROSCOPIC (REFLEX): Squamous Epithelial / LPF: NONE SEEN (ref 0–5)

## 2018-04-27 LAB — PROTIME-INR
INR: 0.98
Prothrombin Time: 12.9 seconds (ref 11.4–15.2)

## 2018-04-27 MED ORDER — SULFAMETHOXAZOLE-TRIMETHOPRIM 800-160 MG PO TABS
1.0000 | ORAL_TABLET | Freq: Once | ORAL | Status: AC
  Administered 2018-04-27: 1 via ORAL
  Filled 2018-04-27: qty 1

## 2018-04-27 NOTE — ED Provider Notes (Signed)
[facility] DEPT Provider Note   CSN: [id number] Arrival date & time: 04/27/18  1543     History   Chief Complaint Chief Complaint  Patient presents with  . Hematuria    HPI Debbie Velasquez is a 67 y.o. female.  HPI Patient was seen at her outpatient providers office and diagnosed with acute cystitis with hematuria and prescribed Bactrim DS.  She is presenting to the emergency department for a second opinion.  She advises that she has had dark urine and noted blood.  Some urgency for urination.  No fever, chills, flank pain or abdominal pain.  Patient's main concern coming to the emergency department is that she does have a history of a renal mass that was successfully removed without recurrence or metastatic disease.  She follows up with urology on a regular basis. Past Medical History:  Diagnosis Date  . Arthritis   . Cancer ([place])    Left renal mass  . History of kidney stones    x3 -remains with one on right.   Marland Kitchen Hx of seasonal allergies    rare flare ups  . Hypertension   . Left renal mass   . Obesity   . Pneumonia    hx of 20 years ago  . Right ureteral stone   . Type 2 diabetes mellitus (HCC)    Type 2  . Wears glasses   . Wears partial dentures    upper    Patient Active Problem List   Diagnosis Date Noted  . Chronic low back pain without sciatica 06/18/2017  . Primary osteoarthritis of right knee 03/26/2017  . Presence of left artificial knee joint 02/26/2017  . Unilateral primary osteoarthritis, left knee 01/12/2017  . 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/12/2017  . Renal mass 12/22/2015    Past Surgical History:  Procedure Laterality Date  . CESAREAN SECTION  yrs ago  . ROBOTIC ASSITED PARTIAL NEPHRECTOMY Left 12/22/2015   Procedure: XI ROBOTIC ASSITED PARTIAL NEPHRECTOMY;  Surgeon: Alexis Frock, MD;  Location: [facility];  Service: Urology;  Laterality: Left;  . TOTAL ABDOMINAL HYSTERECTOMY W/ BILATERAL  SALPINGOOPHORECTOMY  1980's  . TOTAL KNEE ARTHROPLASTY Left 01/12/2017   Procedure: LEFT TOTAL KNEE ARTHROPLASTY and Right knee steroid injection;  Surgeon: Mcarthur Rossetti, MD;  Location: [facility];  Service: Orthopedics;  Laterality: Left;     OB History   None      Home Medications    Prior to Admission medications   Medication Sig Start Date End Date Taking? Authorizing Provider  aspirin 81 MG chewable tablet Chew 1 tablet (81 mg total) by mouth 2 (two) times daily. Patient taking differently: Chew 81 mg by mouth daily.  01/15/17  Yes Mcarthur Rossetti, MD  atorvastatin (LIPITOR) 10 MG tablet Take 1 tablet by mouth daily. 02/13/17  Yes [provider]  losartan-hydrochlorothiazide (HYZAAR) 100-12.5 MG tablet Take 1 tablet by mouth daily.  08/23/15  Yes [provider]  metFORMIN (GLUCOPHAGE) 500 MG tablet Take 500 mg by mouth daily with breakfast.    Yes [provider]  acetaminophen (TYLENOL) 500 MG tablet Take 1,000 mg by mouth every 6 (six) hours as needed for mild pain or headache.    [provider]  cephALEXin (KEFLEX) 500 MG capsule Take 1 capsule (500 mg total) by mouth 2 (two) times daily. Patient not taking: Reported on 05/26/2017 04/05/17   Molpus, John, MD  Diclofenac Sodium 2 % SOLN Place 2 Squirts onto  the skin 2 (two) times daily. Patient not taking: Reported on 08/23/2017 07/16/17   Pete Pelt, PA-C  gabapentin (NEURONTIN) 100 MG capsule TK ONE C PO QHS PRN FOR PAIN 07/24/17   [provider]  meloxicam (MOBIC) 7.5 MG tablet Take 1 tablet (7.5 mg total) by mouth daily as needed. Patient not taking: Reported on 04/27/2018 06/18/17   Pete Pelt, PA-C  methocarbamol (ROBAXIN) 500 MG tablet Take 1 tablet (500 mg total) by mouth daily as needed. Patient not taking: Reported on 04/27/2018 06/18/17   Pete Pelt, PA-C  omeprazole (PRILOSEC) 20 MG capsule Take 1 capsule (20 mg total) by mouth daily. Patient not  taking: Reported on 04/27/2018 11/17/17   Tegeler, Gwenyth Allegra, MD  ondansetron (ZOFRAN) 4 MG tablet Take 1 tablet (4 mg total) by mouth every 8 (eight) hours as needed for nausea or vomiting. Patient not taking: Reported on 04/27/2018 11/17/17   Tegeler, Gwenyth Allegra, MD  Vitamin D, Ergocalciferol, (DRISDOL) 50000 units CAPS capsule TK ONE C PO ONCE A WK 08/06/17   [provider]    Family History Family History  Problem Relation Age of Onset  . Esophageal cancer Maternal Aunt   . Colon cancer Neg Hx   . Stomach cancer Neg Hx   . Rectal cancer Neg Hx     Social History Social History   Tobacco Use  . Smoking status: Never Smoker  . Smokeless tobacco: Never Used  Substance Use Topics  . Alcohol use: No    Alcohol/week: 0.0 oz  . Drug use: No     Allergies   Patient has no known allergies.   Review of Systems Review of Systems 10 Systems reviewed and are negative for acute change except as noted in the HPI.   Physical Exam Updated Vital Signs BP (!) 179/92 (BP Location: Right Wrist)   Pulse 78   Temp 98.4 F (36.9 C) (Oral)   Resp 16   SpO2 99%   Physical Exam  Constitutional: She is oriented to person, place, and time. She appears well-developed and well-nourished.  HENT:  Head: Normocephalic and atraumatic.  Eyes: Pupils are equal, round, and reactive to light. EOM are normal.  Neck: Neck supple.  Cardiovascular: Normal rate, regular rhythm, normal heart sounds and intact distal pulses.  Pulmonary/Chest: Effort normal and breath sounds normal.  Abdominal: Soft. Bowel sounds are normal. She exhibits no distension. There is no tenderness.  Musculoskeletal: Normal range of motion. She exhibits no edema.  Neurological: She is alert and oriented to person, place, and time. She has normal strength. Coordination normal. GCS eye subscore is 4. GCS verbal subscore is 5. GCS motor subscore is 6.  Skin: Skin is warm, dry and intact.  Psychiatric: She has a  normal mood and affect.     ED Treatments / Results  Labs (all labs ordered are listed, but only abnormal results are displayed) Labs Reviewed  URINALYSIS, ROUTINE W REFLEX MICROSCOPIC - Abnormal; Notable for the following components:      Result Value   APPearance CLOUDY (*)    Hgb urine dipstick LARGE (*)    Leukocytes, UA MODERATE (*)    All other components within normal limits  URINALYSIS, MICROSCOPIC (REFLEX) - Abnormal; Notable for the following components:   Bacteria, UA FEW (*)    All other components within normal limits  BASIC METABOLIC PANEL - Abnormal; Notable for the following components:   Creatinine, Ser 1.07 (*)    GFR calc  non Af Amer 52 (*)    All other components within normal limits  CBC - Abnormal; Notable for the following components:   WBC 11.6 (*)    Hemoglobin 11.2 (*)    All other components within normal limits  PROTIME-INR    EKG None  Radiology No results found.  Procedures Procedures (including critical care time)  Medications Ordered in ED Medications  sulfamethoxazole-trimethoprim (BACTRIM DS,SEPTRA DS) 800-160 MG per tablet 1 tablet (1 tablet Oral Given 04/27/18 2119)     Initial Impression / Assessment and Plan / ED Course  I have reviewed the triage vital signs and the nursing notes.  Pertinent labs & imaging results that were available during my care of the patient were reviewed by me and considered in my medical decision making (see chart for details).       Final Clinical Impressions(s) / ED Diagnoses   Final diagnoses:  Hematuria, unspecified type   Patient is clinically well in appearance.  She is nontoxic and alert.  No fever, no chills, no generalized weakness.  She had noted a change in her urine and has confirmed hematuria.  At this time, I agree with plan of initiating Bactrim and close follow-up for the patient.  Her renal function is stable, her CBC is stable.  She has no signs of pyelonephritis.  At this time, I  feel that other diagnostic surveillance imaging study should be at the discretion of her nephrologist or urologist as clinically, there are no concerning symptoms at this time and lab studies are stable.  Patient is in agreement with this plan. ED Discharge Orders    None       Charlesetta Shanks, MD 05/01/18 1208

## 2018-04-27 NOTE — Discharge Instructions (Addendum)
1.  Take Bactrim as prescribed by your provider. 2.  Have a recheck this week for blood in the urine.  You may need reevaluation by your nephrologist.

## 2018-04-27 NOTE — ED Triage Notes (Signed)
She reports gross hematuria, plus some dull aching at right low back area x 2 days. She denies fever, nor any other sign of illness and is in no distress.

## 2018-05-19 ENCOUNTER — Emergency Department (HOSPITAL_COMMUNITY)
Admission: EM | Admit: 2018-05-19 | Discharge: 2018-05-19 | Disposition: A | Discharge status: Home / Self Care | Payer: Medicare Other | Attending: Emergency Medicine | Admitting: Emergency Medicine

## 2018-05-19 ENCOUNTER — Emergency Department (HOSPITAL_COMMUNITY): Payer: Medicare Other

## 2018-05-19 ENCOUNTER — Encounter (HOSPITAL_COMMUNITY): Payer: Self-pay | Admitting: Nurse Practitioner

## 2018-05-19 DIAGNOSIS — Z85528 Personal history of other malignant neoplasm of kidney: Secondary | ICD-10-CM | POA: Insufficient documentation

## 2018-05-19 DIAGNOSIS — Z96652 Presence of left artificial knee joint: Secondary | ICD-10-CM | POA: Diagnosis not present

## 2018-05-19 DIAGNOSIS — N201 Calculus of ureter: Secondary | ICD-10-CM | POA: Diagnosis not present

## 2018-05-19 DIAGNOSIS — Z79899 Other long term (current) drug therapy: Secondary | ICD-10-CM | POA: Diagnosis not present

## 2018-05-19 DIAGNOSIS — E119 Type 2 diabetes mellitus without complications: Secondary | ICD-10-CM | POA: Insufficient documentation

## 2018-05-19 DIAGNOSIS — Z7982 Long term (current) use of aspirin: Secondary | ICD-10-CM | POA: Diagnosis not present

## 2018-05-19 DIAGNOSIS — Z7984 Long term (current) use of oral hypoglycemic drugs: Secondary | ICD-10-CM | POA: Insufficient documentation

## 2018-05-19 DIAGNOSIS — I10 Essential (primary) hypertension: Secondary | ICD-10-CM | POA: Insufficient documentation

## 2018-05-19 DIAGNOSIS — R319 Hematuria, unspecified: Secondary | ICD-10-CM | POA: Diagnosis present

## 2018-05-19 LAB — URINALYSIS, MICROSCOPIC (REFLEX): RBC / HPF: >50 RBC/hpf (ref 0–5)

## 2018-05-19 LAB — URINALYSIS, ROUTINE W REFLEX MICROSCOPIC
BILIRUBIN URINE: NEGATIVE
Glucose, UA: NEGATIVE mg/dL
Ketones, ur: NEGATIVE mg/dL
Nitrite: NEGATIVE
PROTEIN: 30 mg/dL — AB
Specific Gravity, Urine: 1.025 (ref 1.005–1.030)
pH: 5.5 (ref 5.0–8.0)

## 2018-05-19 MED ORDER — OXYCODONE-ACETAMINOPHEN 5-325 MG PO TABS: 1.0000 | ORAL_TABLET | Freq: Four times a day (QID) | ORAL | 0 refills | Status: DC | PRN

## 2018-05-19 MED ORDER — KETOROLAC TROMETHAMINE 60 MG/2ML IM SOLN
30.0000 mg | Freq: Once | INTRAMUSCULAR | Status: AC
  Administered 2018-05-19: 30 mg via INTRAMUSCULAR
  Filled 2018-05-19: qty 2

## 2018-05-19 MED ORDER — CEPHALEXIN 500 MG PO CAPS: 500.0000 mg | ORAL_CAPSULE | Freq: Four times a day (QID) | ORAL | 0 refills | Status: DC

## 2018-05-19 NOTE — Discharge Instructions (Signed)
It was our pleasure to provide your ER care today - we hope that you feel better.  Your CT scan shows a 6 mm kidney stone in the proximal left ureter.   Drink adequate fluids, strain urine.   Take motrin or aleve as need for pain. You may also take percocet as need for pain. No driving  when taking percocet. Also, do not take tylenol or acetaminophen containing medication when taking percocet.   There were a few white blood cells in urine as well - take antibiotic as prescribed.   Follow up with your urologist in the next few days for recheck - call office tomorrow morning to arrange appointment.   Return to ER if worse, new symptoms, fevers, intractable pain, persistent vomiting, other concern.

## 2018-05-19 NOTE — ED Provider Notes (Signed)
[facility] DEPT Provider Note   CSN: [id number] Arrival date & time: 05/19/18  1515     History   Chief Complaint Chief Complaint  Patient presents with  . Hematuria    HPI Debbie Velasquez is a 67 y.o. female.  Patient c/o hematuria in the past day. Notes recent hx same, was treated for uti and symptoms improved. Denies dysuria. No fever or chills. Mild suprapubic and flank pain, dull, non radiating, persistent. Denies any recent trauma or fall. No other abnormal bruising or bleeding, no vaginal bleeding, no rectal bleeding. Denies anticoag use. Remote hx kidney stone, and had tumor on kidney removed a couple years ago. No wt loss.   The history is provided by the patient.  Hematuria  Pertinent negatives include no chest pain, no abdominal pain, no headaches and no shortness of breath.    Past Medical History:  Diagnosis Date  . Arthritis   . Cancer ([place])    Left renal mass  . History of kidney stones    x3 -remains with one on right.   Marland Kitchen Hx of seasonal allergies    rare flare ups  . Hypertension   . Left renal mass   . Obesity   . Pneumonia    hx of 20 years ago  . Right ureteral stone   . Type 2 diabetes mellitus (HCC)    Type 2  . Wears glasses   . Wears partial dentures    upper    Patient Active Problem List   Diagnosis Date Noted  . Chronic low back pain without sciatica 06/18/2017  . Primary osteoarthritis of right knee 03/26/2017  . Presence of left artificial knee joint 02/26/2017  . Unilateral primary osteoarthritis, left knee 01/12/2017  . 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/12/2017  . Renal mass 12/22/2015    Past Surgical History:  Procedure Laterality Date  . CESAREAN SECTION  yrs ago  . ROBOTIC ASSITED PARTIAL NEPHRECTOMY Left 12/22/2015   Procedure: XI ROBOTIC ASSITED PARTIAL NEPHRECTOMY;  Surgeon: Alexis Frock, MD;  Location: [facility];  Service: Urology;  Laterality: Left;  . TOTAL ABDOMINAL  HYSTERECTOMY W/ BILATERAL SALPINGOOPHORECTOMY  1980's  . TOTAL KNEE ARTHROPLASTY Left 01/12/2017   Procedure: LEFT TOTAL KNEE ARTHROPLASTY and Right knee steroid injection;  Surgeon: Mcarthur Rossetti, MD;  Location: [facility];  Service: Orthopedics;  Laterality: Left;     OB History   None      Home Medications    Prior to Admission medications   Medication Sig Start Date End Date Taking? Authorizing Provider  acetaminophen (TYLENOL) 500 MG tablet Take 1,000 mg by mouth every 6 (six) hours as needed for mild pain or headache.    [provider]  aspirin 81 MG chewable tablet Chew 1 tablet (81 mg total) by mouth 2 (two) times daily. Patient taking differently: Chew 81 mg by mouth daily.  01/15/17   Mcarthur Rossetti, MD  atorvastatin (LIPITOR) 10 MG tablet Take 1 tablet by mouth daily. 02/13/17   [provider]  cephALEXin (KEFLEX) 500 MG capsule Take 1 capsule (500 mg total) by mouth 2 (two) times daily. Patient not taking: Reported on 05/26/2017 04/05/17   Molpus, John, MD  Diclofenac Sodium 2 % SOLN Place 2 Squirts onto the skin 2 (two) times daily. Patient not taking: Reported on 08/23/2017 07/16/17   Pete Pelt, PA-C  gabapentin (NEURONTIN) 100 MG capsule TK ONE C PO QHS PRN FOR PAIN 07/24/17  [provider]  losartan-hydrochlorothiazide (HYZAAR) 100-12.5 MG tablet Take 1 tablet by mouth daily.  08/23/15   [provider]  meloxicam (MOBIC) 7.5 MG tablet Take 1 tablet (7.5 mg total) by mouth daily as needed. Patient not taking: Reported on 04/27/2018 06/18/17   Pete Pelt, PA-C  metFORMIN (GLUCOPHAGE) 500 MG tablet Take 500 mg by mouth daily with breakfast.     [provider]  methocarbamol (ROBAXIN) 500 MG tablet Take 1 tablet (500 mg total) by mouth daily as needed. Patient not taking: Reported on 04/27/2018 06/18/17   Pete Pelt, PA-C  omeprazole (PRILOSEC) 20 MG capsule Take 1 capsule (20 mg total) by mouth  daily. Patient not taking: Reported on 04/27/2018 11/17/17   Tegeler, Gwenyth Allegra, MD  ondansetron (ZOFRAN) 4 MG tablet Take 1 tablet (4 mg total) by mouth every 8 (eight) hours as needed for nausea or vomiting. Patient not taking: Reported on 04/27/2018 11/17/17   Tegeler, Gwenyth Allegra, MD  Vitamin D, Ergocalciferol, (DRISDOL) 50000 units CAPS capsule TK ONE C PO ONCE A WK 08/06/17   [provider]    Family History Family History  Problem Relation Age of Onset  . Esophageal cancer Maternal Aunt   . Colon cancer Neg Hx   . Stomach cancer Neg Hx   . Rectal cancer Neg Hx     Social History Social History   Tobacco Use  . Smoking status: Never Smoker  . Smokeless tobacco: Never Used  Substance Use Topics  . Alcohol use: No    Alcohol/week: 0.0 oz  . Drug use: No     Allergies   Patient has no known allergies.   Review of Systems Review of Systems  Constitutional: Negative for fever.  HENT: Negative for nosebleeds.   Eyes: Negative for redness.  Respiratory: Negative for shortness of breath.   Cardiovascular: Negative for chest pain.  Gastrointestinal: Negative for abdominal pain and blood in stool.  Genitourinary: Positive for hematuria. Negative for dysuria and vaginal bleeding.  Musculoskeletal: Negative for back pain.  Skin: Negative for rash.  Neurological: Negative for headaches.  Hematological: Does not bruise/bleed easily.  Psychiatric/Behavioral: Negative for confusion.     Physical Exam Updated Vital Signs BP (!) 176/84 (BP Location: Left Arm)   Pulse 90   Temp 98.9 F (37.2 C)   Resp 20   SpO2 99%   Physical Exam  Constitutional: She appears well-developed and well-nourished.  HENT:  Head: Atraumatic.  Eyes: Conjunctivae are normal. No scleral icterus.  Neck: Neck supple. No tracheal deviation present.  Cardiovascular: Normal rate.  Pulmonary/Chest: Effort normal. No respiratory distress.  Abdominal: Soft. Normal appearance and bowel  sounds are normal. She exhibits no distension and no mass. There is no tenderness. There is no guarding.  Genitourinary:  Genitourinary Comments: No cva tenderness.   Musculoskeletal: She exhibits no edema.  Neurological: She is alert.  Skin: Skin is warm and dry. No rash noted.  Psychiatric: She has a normal mood and affect.  Nursing note and vitals reviewed.    ED Treatments / Results  Labs (all labs ordered are listed, but only abnormal results are displayed) Results for orders placed or performed during the hospital encounter of 05/19/18  Urinalysis, Routine w reflex microscopic- may I&O cath if menses  Result Value Ref Range   Color, Urine BROWN (A) YELLOW   APPearance CLOUDY (A) CLEAR   Specific Gravity, Urine 1.025 1.005 - 1.030   pH 5.5 5.0 - 8.0  Glucose, UA NEGATIVE NEGATIVE mg/dL   Hgb urine dipstick LARGE (A) NEGATIVE   Bilirubin Urine NEGATIVE NEGATIVE   Ketones, ur NEGATIVE NEGATIVE mg/dL   Protein, ur 30 (A) NEGATIVE mg/dL   Nitrite NEGATIVE NEGATIVE   Leukocytes, UA SMALL (A) NEGATIVE  Urinalysis, Microscopic (reflex)  Result Value Ref Range   RBC / HPF >50 0 - 5 RBC/hpf   WBC, UA 6-10 0 - 5 WBC/hpf   Bacteria, UA FEW (A) NONE SEEN   Squamous Epithelial / LPF 0-5 0 - 5   Mucus PRESENT    Ct Renal Stone Study  Result Date: 05/19/2018 CLINICAL DATA:  Hematuria EXAM: CT ABDOMEN AND PELVIS WITHOUT CONTRAST TECHNIQUE: Multidetector CT imaging of the abdomen and pelvis was performed following the standard protocol without IV contrast. COMPARISON:  09/06/2017 FINDINGS: Lower chest: Lung bases are clear. No effusions. Heart is normal size. Hepatobiliary: Focal low-density area in the left hepatic lobe measures 2 cm. This is difficult to characterize on this noncontrast study but likely reflects a small cyst. Gallbladder unremarkable. Pancreas: No focal abnormality or ductal dilatation. Spleen: No focal abnormality.  Normal size. Adrenals/Urinary Tract: Mild left  hydronephrosis. 6 mm proximal left ureteral stone. Bilateral nephrolithiasis. The largest stone is 15 mm in the lower pole of the right kidney. No hydronephrosis on the right. Adrenal glands and urinary bladder unremarkable. Stomach/Bowel: Sigmoid diverticulosis. No active diverticulitis. Stomach and small bowel decompressed, unremarkable. Vascular/Lymphatic: No evidence of aneurysm or adenopathy. Reproductive: Prior hysterectomy.  No adnexal masses. Other: No free fluid or free air. Musculoskeletal: No acute bony abnormality. IMPRESSION: Bilateral nephrolithiasis. 6 mm proximal left ureteral stone with mild left hydronephrosis. Electronically Signed   By: Rolm Baptise M.D.   On: 05/19/2018 18:29    EKG None  Radiology No results found.  Procedures Procedures (including critical care time)  Medications Ordered in ED Medications - No data to display   Initial Impression / Assessment and Plan / ED Course  I have reviewed the triage vital signs and the nursing notes.  Pertinent labs & imaging results that were available during my care of the patient were reviewed by me and considered in my medical decision making (see chart for details).  Labs and imaging ordered.   Reviewed nursing notes and prior charts for additional history.   CT reviewed - c/w left hydro, 6 mm ureteral stone.   Labs reviewed - +blood, nitrite neg.   Discussed ct w pt.  Pt drove self, and wants to be able to drive self home.   toradol im for pain.   Recheck pain resolved.   Pt currently appears stable for d/c.     Final Clinical Impressions(s) / ED Diagnoses   Final diagnoses:  None    ED Discharge Orders    None       Lajean Saver, MD 05/19/18 2041

## 2018-05-19 NOTE — ED Triage Notes (Signed)
Pt states she has been receiving treatment/different antibiotics for UTI for the past few weeks and the symptoms have but worsened. She reports persistent hematuria, CVA/Lower back pain and bladder/pelvis pain.

## 2018-05-21 ENCOUNTER — Other Ambulatory Visit: Payer: Self-pay | Admitting: Urology

## 2018-05-22 ENCOUNTER — Other Ambulatory Visit: Payer: Self-pay

## 2018-05-22 ENCOUNTER — Encounter (HOSPITAL_COMMUNITY): Payer: Self-pay | Admitting: *Deleted

## 2018-05-24 ENCOUNTER — Encounter (HOSPITAL_COMMUNITY)
Admission: RE | Disposition: A | Discharge status: Home / Self Care | Payer: Self-pay | Source: Ambulatory Visit | Attending: Urology

## 2018-05-24 ENCOUNTER — Ambulatory Visit (HOSPITAL_COMMUNITY): Payer: Medicare Other | Admitting: Certified Registered Nurse Anesthetist

## 2018-05-24 ENCOUNTER — Encounter (HOSPITAL_COMMUNITY): Payer: Self-pay | Admitting: *Deleted

## 2018-05-24 ENCOUNTER — Ambulatory Visit (HOSPITAL_COMMUNITY)
Admission: RE | Admit: 2018-05-24 | Discharge: 2018-05-24 | Disposition: A | Discharge status: Home / Self Care | Payer: Medicare Other | Source: Ambulatory Visit | Attending: Urology | Admitting: Urology

## 2018-05-24 ENCOUNTER — Ambulatory Visit (HOSPITAL_COMMUNITY): Payer: Medicare Other

## 2018-05-24 DIAGNOSIS — Z96652 Presence of left artificial knee joint: Secondary | ICD-10-CM | POA: Insufficient documentation

## 2018-05-24 DIAGNOSIS — Z6841 Body Mass Index (BMI) 40.0 and over, adult: Secondary | ICD-10-CM | POA: Diagnosis not present

## 2018-05-24 DIAGNOSIS — Z7982 Long term (current) use of aspirin: Secondary | ICD-10-CM | POA: Insufficient documentation

## 2018-05-24 DIAGNOSIS — Z79899 Other long term (current) drug therapy: Secondary | ICD-10-CM | POA: Diagnosis not present

## 2018-05-24 DIAGNOSIS — Z87442 Personal history of urinary calculi: Secondary | ICD-10-CM | POA: Diagnosis not present

## 2018-05-24 DIAGNOSIS — E119 Type 2 diabetes mellitus without complications: Secondary | ICD-10-CM | POA: Diagnosis not present

## 2018-05-24 DIAGNOSIS — Z9079 Acquired absence of other genital organ(s): Secondary | ICD-10-CM | POA: Insufficient documentation

## 2018-05-24 DIAGNOSIS — K219 Gastro-esophageal reflux disease without esophagitis: Secondary | ICD-10-CM | POA: Insufficient documentation

## 2018-05-24 DIAGNOSIS — Z90710 Acquired absence of both cervix and uterus: Secondary | ICD-10-CM | POA: Insufficient documentation

## 2018-05-24 DIAGNOSIS — Z7984 Long term (current) use of oral hypoglycemic drugs: Secondary | ICD-10-CM | POA: Insufficient documentation

## 2018-05-24 DIAGNOSIS — Z85528 Personal history of other malignant neoplasm of kidney: Secondary | ICD-10-CM | POA: Insufficient documentation

## 2018-05-24 DIAGNOSIS — Z905 Acquired absence of kidney: Secondary | ICD-10-CM | POA: Diagnosis not present

## 2018-05-24 DIAGNOSIS — I10 Essential (primary) hypertension: Secondary | ICD-10-CM | POA: Diagnosis not present

## 2018-05-24 DIAGNOSIS — N132 Hydronephrosis with renal and ureteral calculous obstruction: Secondary | ICD-10-CM | POA: Diagnosis not present

## 2018-05-24 DIAGNOSIS — M1611 Unilateral primary osteoarthritis, right hip: Secondary | ICD-10-CM | POA: Insufficient documentation

## 2018-05-24 DIAGNOSIS — Z90722 Acquired absence of ovaries, bilateral: Secondary | ICD-10-CM | POA: Diagnosis not present

## 2018-05-24 DIAGNOSIS — N201 Calculus of ureter: Secondary | ICD-10-CM | POA: Diagnosis present

## 2018-05-24 HISTORY — PX: CYSTOSCOPY WITH RETROGRADE PYELOGRAM, URETEROSCOPY AND STENT PLACEMENT: SHX5789

## 2018-05-24 HISTORY — PX: HOLMIUM LASER APPLICATION: SHX5852

## 2018-05-24 HISTORY — DX: Gastro-esophageal reflux disease without esophagitis: K21.9

## 2018-05-24 LAB — GLUCOSE, CAPILLARY
GLUCOSE-CAPILLARY: 118 mg/dL — AB (ref 70–99)
Glucose-Capillary: 124 mg/dL — ABNORMAL HIGH (ref 70–99)

## 2018-05-24 LAB — BASIC METABOLIC PANEL
Anion gap: 11 (ref 5–15)
BUN: 23 mg/dL (ref 8–23)
CALCIUM: 9.2 mg/dL (ref 8.9–10.3)
CHLORIDE: 104 mmol/L (ref 98–111)
CO2: 27 mmol/L (ref 22–32)
Creatinine, Ser: 1.25 mg/dL — ABNORMAL HIGH (ref 0.44–1.00)
GFR calc Af Amer: 50 mL/min — ABNORMAL LOW (ref >60)
GFR calc non Af Amer: 43 mL/min — ABNORMAL LOW (ref >60)
GLUCOSE: 128 mg/dL — AB (ref 70–99)
Potassium: 4.2 mmol/L (ref 3.5–5.1)
Sodium: 142 mmol/L (ref 135–145)

## 2018-05-24 LAB — CBC
HEMATOCRIT: 35.1 % — AB (ref 36.0–46.0)
HEMOGLOBIN: 10.9 g/dL — AB (ref 12.0–15.0)
MCH: 27.7 pg (ref 26.0–34.0)
MCHC: 31.1 g/dL (ref 30.0–36.0)
MCV: 89.1 fL (ref 78.0–100.0)
Platelets: 330 10*3/uL (ref 150–400)
RBC: 3.94 MIL/uL (ref 3.87–5.11)
RDW: 13.6 % (ref 11.5–15.5)
WBC: 13.4 10*3/uL — ABNORMAL HIGH (ref 4.0–10.5)

## 2018-05-24 LAB — HEMOGLOBIN A1C
HEMOGLOBIN A1C: 6 % — AB (ref 4.8–5.6)
Mean Plasma Glucose: 125.5 mg/dL

## 2018-05-24 SURGERY — CYSTOURETEROSCOPY, WITH RETROGRADE PYELOGRAM AND STENT INSERTION
Anesthesia: General | Laterality: Left

## 2018-05-24 MED ORDER — KETOROLAC TROMETHAMINE 10 MG PO TABS: 10.0000 mg | ORAL_TABLET | Freq: Four times a day (QID) | ORAL | 0 refills | Status: DC | PRN

## 2018-05-24 MED ORDER — LACTATED RINGERS IV SOLN
INTRAVENOUS | Status: DC
  Administered 2018-05-24 (×2): via INTRAVENOUS

## 2018-05-24 MED ORDER — ONDANSETRON HCL 4 MG/2ML IJ SOLN
INTRAMUSCULAR | Status: DC | PRN
  Administered 2018-05-24: 4 mg via INTRAVENOUS

## 2018-05-24 MED ORDER — CEPHALEXIN 500 MG PO CAPS: 500.0000 mg | ORAL_CAPSULE | Freq: Two times a day (BID) | ORAL | 0 refills | Status: DC

## 2018-05-24 MED ORDER — LIDOCAINE 2% (20 MG/ML) 5 ML SYRINGE
INTRAMUSCULAR | Status: DC | PRN
  Administered 2018-05-24: 100 mg via INTRAVENOUS

## 2018-05-24 MED ORDER — ONDANSETRON HCL 4 MG/2ML IJ SOLN
INTRAMUSCULAR | Status: AC
  Filled 2018-05-24: qty 2

## 2018-05-24 MED ORDER — DEXAMETHASONE SODIUM PHOSPHATE 10 MG/ML IJ SOLN
INTRAMUSCULAR | Status: DC | PRN
  Administered 2018-05-24: 10 mg via INTRAVENOUS

## 2018-05-24 MED ORDER — SUCCINYLCHOLINE CHLORIDE 200 MG/10ML IV SOSY
PREFILLED_SYRINGE | INTRAVENOUS | Status: DC | PRN
  Administered 2018-05-24: 120 mg via INTRAVENOUS

## 2018-05-24 MED ORDER — SENNOSIDES-DOCUSATE SODIUM 8.6-50 MG PO TABS: 1.0000 | ORAL_TABLET | Freq: Two times a day (BID) | ORAL | 0 refills | Status: DC

## 2018-05-24 MED ORDER — PROPOFOL 10 MG/ML IV BOLUS
INTRAVENOUS | Status: DC | PRN
  Administered 2018-05-24 (×2): 50 mg via INTRAVENOUS
  Administered 2018-05-24: 100 mg via INTRAVENOUS

## 2018-05-24 MED ORDER — PROPOFOL 10 MG/ML IV BOLUS
INTRAVENOUS | Status: AC
  Filled 2018-05-24: qty 20

## 2018-05-24 MED ORDER — LIDOCAINE 2% (20 MG/ML) 5 ML SYRINGE
INTRAMUSCULAR | Status: AC
  Filled 2018-05-24: qty 5

## 2018-05-24 MED ORDER — FENTANYL CITRATE (PF) 100 MCG/2ML IJ SOLN: 25.0000 ug | INTRAMUSCULAR | Status: DC | PRN

## 2018-05-24 MED ORDER — MIDAZOLAM HCL 5 MG/5ML IJ SOLN
INTRAMUSCULAR | Status: DC | PRN
  Administered 2018-05-24 (×2): 1 mg via INTRAVENOUS

## 2018-05-24 MED ORDER — MIDAZOLAM HCL 2 MG/2ML IJ SOLN
INTRAMUSCULAR | Status: AC
  Filled 2018-05-24: qty 2

## 2018-05-24 MED ORDER — ONDANSETRON HCL 4 MG/2ML IJ SOLN
4.0000 mg | Freq: Once | INTRAMUSCULAR | Status: AC
  Administered 2018-05-24: 4 mg via INTRAVENOUS

## 2018-05-24 MED ORDER — FENTANYL CITRATE (PF) 100 MCG/2ML IJ SOLN
INTRAMUSCULAR | Status: DC | PRN
  Administered 2018-05-24 (×2): 50 ug via INTRAVENOUS

## 2018-05-24 MED ORDER — FAMOTIDINE IN NACL 20-0.9 MG/50ML-% IV SOLN
20.0000 mg | Freq: Once | INTRAVENOUS | Status: AC
  Administered 2018-05-24: 20 mg via INTRAVENOUS
  Filled 2018-05-24: qty 50

## 2018-05-24 MED ORDER — SUCCINYLCHOLINE CHLORIDE 200 MG/10ML IV SOSY
PREFILLED_SYRINGE | INTRAVENOUS | Status: AC
  Filled 2018-05-24: qty 10

## 2018-05-24 MED ORDER — DEXAMETHASONE SODIUM PHOSPHATE 10 MG/ML IJ SOLN
INTRAMUSCULAR | Status: AC
  Filled 2018-05-24: qty 1

## 2018-05-24 MED ORDER — FENTANYL CITRATE (PF) 100 MCG/2ML IJ SOLN
INTRAMUSCULAR | Status: AC
  Filled 2018-05-24: qty 2

## 2018-05-24 MED ORDER — GENTAMICIN SULFATE 40 MG/ML IJ SOLN
400.0000 mg | INTRAVENOUS | Status: AC
  Administered 2018-05-24: 400 mg via INTRAVENOUS
  Filled 2018-05-24: qty 10

## 2018-05-24 MED ORDER — OXYCODONE-ACETAMINOPHEN 5-325 MG PO TABS: 1.0000 | ORAL_TABLET | Freq: Four times a day (QID) | ORAL | 0 refills | Status: DC | PRN

## 2018-05-24 MED ORDER — ONDANSETRON HCL 4 MG/2ML IJ SOLN: 4.0000 mg | Freq: Once | INTRAMUSCULAR | Status: DC | PRN

## 2018-05-24 SURGICAL SUPPLY — 26 items
BAG URO CATCHER STRL LF (MISCELLANEOUS) ×3 IMPLANT
BASKET LASER NITINOL 1.9FR (BASKET) IMPLANT
BSKT STON RTRVL 120 1.9FR (BASKET)
CATH INTERMIT  6FR 70CM (CATHETERS) ×3 IMPLANT
CLOTH BEACON ORANGE TIMEOUT ST (SAFETY) ×3 IMPLANT
COVER SURGICAL LIGHT HANDLE (MISCELLANEOUS) ×3 IMPLANT
EXTRACTOR STONE 1.7FRX115CM (UROLOGICAL SUPPLIES) IMPLANT
FIBER LASER FLEXIVA 1000 (UROLOGICAL SUPPLIES) IMPLANT
FIBER LASER FLEXIVA 365 (UROLOGICAL SUPPLIES) IMPLANT
FIBER LASER FLEXIVA 550 (UROLOGICAL SUPPLIES) IMPLANT
FIBER LASER TRAC TIP (UROLOGICAL SUPPLIES) ×2 IMPLANT
GLOVE BIOGEL M STRL SZ7.5 (GLOVE) ×3 IMPLANT
GOWN STRL REUS W/TWL LRG LVL3 (GOWN DISPOSABLE) ×6 IMPLANT
GUIDEWIRE ANG ZIPWIRE 038X150 (WIRE) ×3 IMPLANT
GUIDEWIRE STR DUAL SENSOR (WIRE) ×3 IMPLANT
IV NS 1000ML (IV SOLUTION) ×3
IV NS 1000ML BAXH (IV SOLUTION) ×1 IMPLANT
MANIFOLD NEPTUNE II (INSTRUMENTS) ×3 IMPLANT
PACK CYSTO (CUSTOM PROCEDURE TRAY) ×3 IMPLANT
SHEATH URETERAL 12FRX28CM (UROLOGICAL SUPPLIES) ×2 IMPLANT
SHEATH URETERAL 12FRX35CM (MISCELLANEOUS) IMPLANT
STENT POLARIS 5FRX24 (STENTS) ×2 IMPLANT
SYR CONTROL 10ML LL (SYRINGE) ×3 IMPLANT
TUBE FEEDING 8FR 16IN STR KANG (MISCELLANEOUS) ×3 IMPLANT
TUBING CONNECTING 10 (TUBING) ×2 IMPLANT
TUBING CONNECTING 10' (TUBING) ×1

## 2018-05-24 NOTE — Transfer of Care (Signed)
Immediate Anesthesia Transfer of Care Note  Patient: Debbie Velasquez  Procedure(s) Performed: CYSTOSCOPY WITH RETROGRADE PYELOGRAM, URETEROSCOPY AND STENT PLACEMENT (Left ) HOLMIUM LASER APPLICATION (Left )  Patient Location: PACU  Anesthesia Type:General  Level of Consciousness: drowsy and patient cooperative  Airway & Oxygen Therapy: Patient Spontanous Breathing and Patient connected to face mask oxygen  Post-op Assessment: Report given to RN and Post -op Vital signs reviewed and stable  Post vital signs: Reviewed and stable  Last Vitals:  Vitals Value Taken Time  BP 157/82 05/24/2018  5:06 PM  Temp 36.7 C 05/24/2018  5:06 PM  Pulse 88 05/24/2018  5:08 PM  Resp 18 05/24/2018  5:08 PM  SpO2 100 % 05/24/2018  5:08 PM  Vitals shown include unvalidated device data.  Last Pain:  Vitals:   05/24/18 1706  TempSrc:   PainSc: (P) 0-No pain         Complications: No apparent anesthesia complications

## 2018-05-24 NOTE — Progress Notes (Signed)
Patient  Called this am around 0830am and stated she was having " a lot of gas" and wanted to know if she could take some Gaviscon chewable which she takes as needed as home.  Called and spoke with Gwenlyn Perking ,RN in Short stay and she stated that would be fine.  Called patient back and made patient aware of above.

## 2018-05-24 NOTE — Anesthesia Procedure Notes (Signed)
Procedure Name: Intubation Date/Time: 05/24/2018 4:04 PM Performed by: Montel Clock, CRNA Pre-anesthesia Checklist: Patient identified, Emergency Drugs available, Suction available, Patient being monitored and Timeout performed Patient Re-evaluated:Patient Re-evaluated prior to induction Oxygen Delivery Method: Circle system utilized Preoxygenation: Pre-oxygenation with 100% oxygen Induction Type: IV induction and Rapid sequence Laryngoscope Size: Mac and 3 Grade View: Grade I Tube type: Oral Tube size: 7.0 mm Number of attempts: 1 Airway Equipment and Method: Stylet Placement Confirmation: ETT inserted through vocal cords under direct vision,  positive ETCO2 and breath sounds checked- equal and bilateral Secured at: 21 cm Tube secured with: Tape Dental Injury: Teeth and Oropharynx as per pre-operative assessment

## 2018-05-24 NOTE — Anesthesia Preprocedure Evaluation (Addendum)
Anesthesia Evaluation  Patient identified by MRN, date of birth, ID band Patient awake    Reviewed: Allergy & Precautions, NPO status , Patient's Chart, lab work & pertinent test results  Airway Mallampati: III  TM Distance: >3 FB Neck ROM: Full    Dental  (+) Upper Dentures   Pulmonary neg pulmonary ROS,    Pulmonary exam normal breath sounds clear to auscultation       Cardiovascular hypertension, Pt. on medications Normal cardiovascular exam Rhythm:Regular Rate:Normal  ECG: SR, rate 90   Neuro/Psych negative neurological ROS  negative psych ROS   GI/Hepatic Neg liver ROS, GERD  Medicated and Controlled,N/V   Endo/Other  diabetes, Oral Hypoglycemic AgentsMorbid obesity  Renal/GU negative Renal ROS     Musculoskeletal negative musculoskeletal ROS (+)   Abdominal (+) + obese,   Peds  Hematology  (+) anemia ,   Anesthesia Other Findings LEFT URETERAL CALCULUS  Reproductive/Obstetrics                            Anesthesia Physical Anesthesia Plan  ASA: III  Anesthesia Plan: General   Post-op Pain Management:    Induction: Intravenous and Rapid sequence  PONV Risk Score and Plan: 4 or greater and Midazolam, Dexamethasone, Ondansetron and Treatment may vary due to age or medical condition  Airway Management Planned: Oral ETT  Additional Equipment:   Intra-op Plan:   Post-operative Plan: Extubation in OR  Informed Consent: I have reviewed the patients History and Physical, chart, labs and discussed the procedure including the risks, benefits and alternatives for the proposed anesthesia with the patient or authorized representative who has indicated his/her understanding and acceptance.   Dental advisory given  Plan Discussed with: CRNA  Anesthesia Plan Comments:        Anesthesia Quick Evaluation

## 2018-05-24 NOTE — Anesthesia Postprocedure Evaluation (Signed)
Anesthesia Post Note  Patient: SHAUNI HENNER  Procedure(s) Performed: CYSTOSCOPY WITH RETROGRADE PYELOGRAM, URETEROSCOPY AND STENT PLACEMENT (Left ) HOLMIUM LASER APPLICATION (Left )     Patient location during evaluation: PACU Anesthesia Type: General Level of consciousness: awake and alert Pain management: pain level controlled Vital Signs Assessment: post-procedure vital signs reviewed and stable Respiratory status: spontaneous breathing, nonlabored ventilation, respiratory function stable and patient connected to nasal cannula oxygen Cardiovascular status: blood pressure returned to baseline and stable Postop Assessment: no apparent nausea or vomiting Anesthetic complications: no    Last Vitals:  Vitals:   05/24/18 1745 05/24/18 1820  BP: (!) 157/72 (!) 162/74  Pulse: 70 73  Resp: 17 16  Temp:  36.8 C  SpO2: 93% 97%    Last Pain:  Vitals:   05/24/18 1820  TempSrc:   PainSc: 0-No pain                 Elice Crigger P Jiah Bari

## 2018-05-24 NOTE — H&P (Signed)
Debbie Velasquez is an 67 y.o. female.    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 Pre-op staging CT and MRI w/o distant disease.    2 - Solid RIGHT Renal Mass - Rt 2.7cm posterior medial mass with avid enhancement on CT 06/2016. Mass abuts posterior edge of renal vein. 2 artery (lower accessory) / 2 vein right renovascular anatomy.   Recent Surveillance:  08/2016 - MRI stable Rt mass, Lt no recurrence  08/2017 - CT stable Rt mass, Lt no recurrence   3 - Nephrolithiasis - Rt 3mm UVJ stone by ER CT 80/3212 on eval colicky flank pain. F/u US 09/2015 with resolved hydo and symptoms c/w likelyi passage. Ipsilateral 38mm lower pole stone as well.   04/2018 - ER CT 44mm RLP non-obstructing stone but Left proximal 85mm fusiform stone as well as scattered small renal stones and mild hydro.    PMH sig for morbid obesity, DM2 (no neuropathy), benign hyst, Rt hip and bilateral knee OA, Left knee replacement. Her PCP is Debbie Agar MD. Her daughter Debbie Velasquez ([place]) is also very involved in her care.    Today "Debbie Velasquez " is seen to proceed with LEFT Ureteroscopic Stone Manipulation. Her colic remain. No interval fevers, most recetn UCX negaitve.    Past Medical History:  Diagnosis Date  . Arthritis   . Cancer ([place])    Left renal mass  . GERD (gastroesophageal reflux disease)   . History of kidney stones    x3 -remains with one on right.   Marland Kitchen Hx of seasonal allergies    rare flare ups  . Hypertension   . Left renal mass   . Obesity   . Pneumonia    hx of 20 years ago  . Right ureteral stone   . Type 2 diabetes mellitus (HCC)    Type 2  . Wears glasses   . Wears partial dentures    upper    Past Surgical History:  Procedure Laterality Date  . CESAREAN SECTION  yrs ago  . ROBOTIC ASSITED PARTIAL NEPHRECTOMY Left  12/22/2015   Procedure: XI ROBOTIC ASSITED PARTIAL NEPHRECTOMY;  Surgeon: Alexis Frock, MD;  Location: [facility];  Service: Urology;  Laterality: Left;  . TOTAL ABDOMINAL HYSTERECTOMY W/ BILATERAL SALPINGOOPHORECTOMY  1980's  . TOTAL KNEE ARTHROPLASTY Left 01/12/2017   Procedure: LEFT TOTAL KNEE ARTHROPLASTY and Right knee steroid injection;  Surgeon: Mcarthur Rossetti, MD;  Location: [facility];  Service: Orthopedics;  Laterality: Left;    Family History  Problem Relation Age of Onset  . Esophageal cancer Maternal Aunt   . Colon cancer Neg Hx   . Stomach cancer Neg Hx   . Rectal cancer Neg Hx    Social History:  reports that she has never smoked. She has never used smokeless tobacco. She reports that she does not drink alcohol or use drugs.  Allergies: No Known Allergies  No medications prior to admission.    No results found for this or any previous visit (from the past 48 hour(s)). No results found.  Review of Systems  Constitutional: Negative.  Negative for chills and fever.  HENT: Negative.   Eyes: Negative.   Respiratory: Negative.   Cardiovascular: Negative.   Gastrointestinal: Positive for nausea and vomiting.  Genitourinary: Negative.   Musculoskeletal: Negative.   Skin: Negative.  Neurological: Negative.   Endo/Heme/Allergies: Negative.   Psychiatric/Behavioral: Negative.     There were no vitals taken for this visit. Physical Exam  Constitutional: She appears well-developed.  HENT:  Head: Normocephalic.  Eyes: Pupils are equal, round, and reactive to light.  Neck: Normal range of motion.  Cardiovascular: Normal rate.  Respiratory: Effort normal.  GI:  Stable large truncal obesity.   Genitourinary:  Genitourinary Comments: Mild left CVAT  Musculoskeletal: Normal range of motion.  Neurological: She is alert.  Psychiatric: She has a normal mood and affect.     Assessment/Plan  Proceed as planned with LEFT ureteroscopic stone manipulaiton with goal of  left side stone free. Risks, benefits, alternatives, expected peri-op course discussed in detail.   Alexis Frock, MD 05/24/2018, 11:35 AM

## 2018-05-24 NOTE — Brief Op Note (Signed)
05/24/2018  4:48 PM  PATIENT:  Debbie Velasquez  67 y.o. female  PRE-OPERATIVE DIAGNOSIS:  LEFT URETERAL CALCULUS  POST-OPERATIVE DIAGNOSIS:  LEFT URETERAL CALCULUS  PROCEDURE:  Procedure(s): CYSTOSCOPY WITH RETROGRADE PYELOGRAM, URETEROSCOPY AND STENT PLACEMENT (Left) HOLMIUM LASER APPLICATION (Left)  SURGEON:  Surgeon(s) and Role:    Alexis Frock, MD - Primary  PHYSICIAN ASSISTANT:   ASSISTANTS: none   ANESTHESIA:   general  EBL:  minimal   BLOOD ADMINISTERED:none  DRAINS: none   LOCAL MEDICATIONS USED:  NONE  SPECIMEN:  Source of Specimen:  left renal/ureteral stone fragments  DISPOSITION OF SPECIMEN:  [facility] for compositional analysis  COUNTS:  YES  TOURNIQUET:  * No tourniquets in log *  DICTATION: .Other Dictation: Dictation Number [id number]  PLAN OF CARE: Discharge to home after PACU  PATIENT DISPOSITION:  PACU - hemodynamically stable.   Delay start of Pharmacological VTE agent (>24hrs) due to surgical blood loss or risk of bleeding: yes

## 2018-05-24 NOTE — Progress Notes (Signed)
Patient called and stated she " felt like the stone was moving this am and she was nauseated. No nausea medication at home.    "  Daughter with patient.  Instructed patient to call Short Stay at [phone] and to call office  Of Dr Tresa Moore at [phone].  Nurse called Short Stay and made aware of above.  Spoke with Johnn Hai who stated she would put a note on chart.   Called patient back and spoke with daughter and daughter stated patient was nauseated and they had called office of Dr Tresa Moore and left a message.  Informed daughter that I would call office of Dr Tresa Moore and speak with Triage nurse at office and have nurse give them a call.  Daughter voiced understanding.  Patient has been drinking clear liquids this am.  [facility] and spoke with Margarita Grizzle ( Triage Nurse) at office and made her aware of above.  Margarita Grizzle stated she would be in touch with Dr Tresa Moore and give patient a call.

## 2018-05-24 NOTE — Discharge Instructions (Signed)
1 - You may have urinary urgency (bladder spasms) and bloody urine on / off with stent in place. This is normal. ° °2 - Call MD or go to ER for fever >102, severe pain / nausea / vomiting not relieved by medications, or acute change in medical status ° °

## 2018-05-24 NOTE — Op Note (Signed)
NAME: Debbie Velasquez, Debbie Velasquez MEDICAL RECORD [id number] ACCOUNT [id number] DATE OF BIRTH:[birth year] FACILITY: [facility] LOCATION: [facility] PHYSICIAN:Zykee Avakian, MD  OPERATIVE REPORT  DATE OF PROCEDURE:  05/24/2018  PREOPERATIVE DIAGNOSIS:  Left proximal ureteral stone with refractory colic.  PROCEDURE: 1.  Cystoscopy, left retrograde pyelogram, interpretation 2.  Left ureteroscopy with laser lithotripsy. 3.  Extraction of left ureteral stent 5 x 24 Polaris, no tether.  ESTIMATED BLOOD LOSS:  Nil.  COMPLICATIONS:  None.  SPECIMENS:  Left renal stone fragments for composition analysis.  INDICATIONS:  The patient is a pleasant, but quite comorbid 67 year old lady who was found on workup for colicky flank pain to have a fairly large left proximal ureteral stone with significant hydronephrosis.  She also has contralateral lower pole stone,  nonobstructing.  Options were discussed for management including shockwave lithotripsy versus ureteroscopy.  Given her large body habitus, ureteroscopy would clearly be the most definitive, and she wished to proceed with the goal of  stone free.   Informed consent was obtained and placed in the medical record.  PROCEDURE IN DETAIL:  The patient being identified, the procedure being left ureteroscopic stone ablation was confirmed.  Procedure timeout was performed.  Intravenous antibiotics were administered.  General anesthesia was induced.  The patient was  placed into a low-lithotomy position.  A sterile field was created by prepping and draping the patient's vagina, introitus, and proximal thighs using iodine.  Next, cystourethroscopy was performed using a 22-French rigid cystoscope.  Inspection of the  urinary bladder revealed no diverticula, calcifications or papillary lesions.  The left ureteral orifice was cannulated with a 6-French end-hole catheter, and left retrograde pyelogram was obtained.  Left retrograde pyelogram demonstrated a single  left ureter, single-system left kidney.  There was a filling defect in the proximal ureter consistent with known stone.  This appeared to be a retrograde position into the renal pelvis with some blue  contrast administration.  A 0.038 ZIPwire was advanced to lower pole and set aside as a safety wire.  An 8-French feeding tube was placed in the urinary bladder for pressure release, and a semi-rigid ureteroscopy was performed the entire length of the  left ureter alongside a separate sensor working wire.  No mucosal abnormalities were found.  The semirigid scope was exchanged for a 12/14 x 28 cm ureteral access sheath to the level of the proximal ureter using continuous fluoroscopic guidance, and  flexible digital ureteroscopy was performed of the left kidney, including all calices x3 with a single channel of the ureteroscope.  As expected, there was a dominant calcification and now mobile within the renal pelvis consistent with a prior proximal  ureteral stone.  There were several other smaller calcifications as well.  These were all retrograde positioned into extreme lower pole to allow for less acute angulation and holmium laser energy applied to the stone using settings of 0.2 joules and 20  Hz.  All stones were ablated using a dusting technique.  Approximately 70% of stone material was dusted.  The remaining 30% volume was fragmented into pieces approximately 1-2 mm that were then sequentially grasped and escape basket removed and set aside  for composition analysis.  Following these maneuvers, excellent hemostasis.  No evidence of any renal perforation.  There was complete resolution of all accessible stone fragments larger than 130 mm.  The access sheath was removed under continuous  vision.  No mucosal abnormalities were found.  Given the large volume stone burden, it was felt that interval stenting with  no tether would be warranted.  As such, a new 5 x 24 Polaris-type stent was placed over the safety  wire using fluoroscopic  guidance.  Good proximal and distal points were noted.  The procedure was then terminated.  The patient tolerated the procedure well.  No immediate perioperative complications.  The patient was taken to postanesthesia care in stable condition.  LN/NUANCE  D:05/24/2018 T:05/24/2018 JOB:[id number]

## 2018-05-25 ENCOUNTER — Encounter (HOSPITAL_COMMUNITY): Payer: Self-pay | Admitting: Urology

## 2018-09-10 ENCOUNTER — Emergency Department (HOSPITAL_COMMUNITY)
Admission: EM | Admit: 2018-09-10 | Discharge: 2018-09-10 | Disposition: A | Discharge status: Home / Self Care | Payer: Medicare Other | Attending: Emergency Medicine | Admitting: Emergency Medicine

## 2018-09-10 ENCOUNTER — Other Ambulatory Visit: Payer: Self-pay

## 2018-09-10 DIAGNOSIS — Z7984 Long term (current) use of oral hypoglycemic drugs: Secondary | ICD-10-CM | POA: Diagnosis not present

## 2018-09-10 DIAGNOSIS — E119 Type 2 diabetes mellitus without complications: Secondary | ICD-10-CM | POA: Insufficient documentation

## 2018-09-10 DIAGNOSIS — Z7982 Long term (current) use of aspirin: Secondary | ICD-10-CM | POA: Diagnosis not present

## 2018-09-10 DIAGNOSIS — N3000 Acute cystitis without hematuria: Secondary | ICD-10-CM | POA: Insufficient documentation

## 2018-09-10 DIAGNOSIS — R42 Dizziness and giddiness: Secondary | ICD-10-CM

## 2018-09-10 DIAGNOSIS — I10 Essential (primary) hypertension: Secondary | ICD-10-CM

## 2018-09-10 DIAGNOSIS — Z79899 Other long term (current) drug therapy: Secondary | ICD-10-CM | POA: Insufficient documentation

## 2018-09-10 DIAGNOSIS — Z85528 Personal history of other malignant neoplasm of kidney: Secondary | ICD-10-CM | POA: Diagnosis not present

## 2018-09-10 DIAGNOSIS — Z96652 Presence of left artificial knee joint: Secondary | ICD-10-CM | POA: Diagnosis not present

## 2018-09-10 LAB — URINALYSIS, ROUTINE W REFLEX MICROSCOPIC
BILIRUBIN URINE: NEGATIVE
Bacteria, UA: NONE SEEN
Glucose, UA: NEGATIVE mg/dL
HGB URINE DIPSTICK: NEGATIVE
KETONES UR: NEGATIVE mg/dL
Nitrite: NEGATIVE
Protein, ur: NEGATIVE mg/dL
Specific Gravity, Urine: 1.013 (ref 1.005–1.030)
pH: 5 (ref 5.0–8.0)

## 2018-09-10 LAB — BASIC METABOLIC PANEL
Anion gap: 10 (ref 5–15)
BUN: 25 mg/dL — ABNORMAL HIGH (ref 8–23)
CALCIUM: 9.3 mg/dL (ref 8.9–10.3)
CO2: 26 mmol/L (ref 22–32)
CREATININE: 1.05 mg/dL — AB (ref 0.44–1.00)
Chloride: 103 mmol/L (ref 98–111)
GFR calc non Af Amer: 53 mL/min — ABNORMAL LOW (ref >60)
GLUCOSE: 131 mg/dL — AB (ref 70–99)
Potassium: 4.1 mmol/L (ref 3.5–5.1)
Sodium: 139 mmol/L (ref 135–145)

## 2018-09-10 LAB — CBC
HEMATOCRIT: 32.9 % — AB (ref 36.0–46.0)
Hemoglobin: 9.6 g/dL — ABNORMAL LOW (ref 12.0–15.0)
MCH: 27 pg (ref 26.0–34.0)
MCHC: 29.2 g/dL — AB (ref 30.0–36.0)
MCV: 92.7 fL (ref 80.0–100.0)
Platelets: 302 10*3/uL (ref 150–400)
RBC: 3.55 MIL/uL — ABNORMAL LOW (ref 3.87–5.11)
RDW: 13.2 % (ref 11.5–15.5)
WBC: 11 10*3/uL — ABNORMAL HIGH (ref 4.0–10.5)
nRBC: 0 % (ref 0.0–0.2)

## 2018-09-10 LAB — CBG MONITORING, ED: Glucose-Capillary: 108 mg/dL — ABNORMAL HIGH (ref 70–99)

## 2018-09-10 MED ORDER — CLONIDINE HCL 0.1 MG PO TABS
0.1000 mg | ORAL_TABLET | Freq: Once | ORAL | Status: DC
  Filled 2018-09-10: qty 1

## 2018-09-10 MED ORDER — CEPHALEXIN 500 MG PO CAPS: 500.0000 mg | ORAL_CAPSULE | Freq: Two times a day (BID) | ORAL | 0 refills | Status: DC

## 2018-09-10 MED ORDER — CEPHALEXIN 500 MG PO CAPS
500.0000 mg | ORAL_CAPSULE | Freq: Once | ORAL | Status: AC
  Administered 2018-09-10: 500 mg via ORAL
  Filled 2018-09-10: qty 1

## 2018-09-10 NOTE — ED Triage Notes (Signed)
Pt reports waking from sleep with dizziness and nausea.

## 2018-09-10 NOTE — ED Notes (Signed)
Pt ambulated to BR with no assistance and stated she did not feel dizzy.

## 2018-09-10 NOTE — ED Provider Notes (Signed)
[facility] DEPT Provider Note   CSN: [id number] Arrival date & time: 09/10/18  1017     History   Chief Complaint Chief Complaint  Patient presents with  . Dizziness    HPI Debbie Velasquez is a 67 y.o. female.  Patient presents to the emergency department for evaluation of dizziness.  Patient reports that she went to bed feeling fine.  She got up tonight to go to the bathroom and started feeling dizzy.  She reports that she stood up fast and the room started spinning on her.  This quickly improved and then she was simply lightheaded for a while.  She did not have a headache, blurred vision.  No chest pain, palpitations, shortness of breath.  Upon evaluation in the ER, all symptoms have resolved.  Patient reports that her blood pressure was elevated at home.     Past Medical History:  Diagnosis Date  . Arthritis   . Cancer ([place])    Left renal mass  . GERD (gastroesophageal reflux disease)   . History of kidney stones    x3 -remains with one on right.   Marland Kitchen Hx of seasonal allergies    rare flare ups  . Hypertension   . Left renal mass   . Obesity   . Pneumonia    hx of 20 years ago  . Right ureteral stone   . Type 2 diabetes mellitus (HCC)    Type 2  . Wears glasses   . Wears partial dentures    upper    Patient Active Problem List   Diagnosis Date Noted  . Chronic low back pain without sciatica 06/18/2017  . Primary osteoarthritis of right knee 03/26/2017  . Presence of left artificial knee joint 02/26/2017  . Unilateral primary osteoarthritis, left knee 01/12/2017  . 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/12/2017  . Renal mass 12/22/2015    Past Surgical History:  Procedure Laterality Date  . CESAREAN SECTION  yrs ago  . CYSTOSCOPY WITH RETROGRADE PYELOGRAM, URETEROSCOPY AND STENT PLACEMENT Left 05/24/2018   Procedure: CYSTOSCOPY WITH RETROGRADE PYELOGRAM, URETEROSCOPY AND STENT PLACEMENT;  Surgeon: Alexis Frock,  MD;  Location: [facility];  Service: Urology;  Laterality: Left;  . HOLMIUM LASER APPLICATION Left 02/20/257   Procedure: HOLMIUM LASER APPLICATION;  Surgeon: Alexis Frock, MD;  Location: [facility];  Service: Urology;  Laterality: Left;  . ROBOTIC ASSITED PARTIAL NEPHRECTOMY Left 12/22/2015   Procedure: XI ROBOTIC ASSITED PARTIAL NEPHRECTOMY;  Surgeon: Alexis Frock, MD;  Location: [facility];  Service: Urology;  Laterality: Left;  . TOTAL ABDOMINAL HYSTERECTOMY W/ BILATERAL SALPINGOOPHORECTOMY  1980's  . TOTAL KNEE ARTHROPLASTY Left 01/12/2017   Procedure: LEFT TOTAL KNEE ARTHROPLASTY and Right knee steroid injection;  Surgeon: Mcarthur Rossetti, MD;  Location: [facility];  Service: Orthopedics;  Laterality: Left;     OB History   None      Home Medications    Prior to Admission medications   Medication Sig Start Date End Date Taking? Authorizing Provider  Alum Hydroxide-Mag Trisilicate (GAVISCON) 52-77.8 MG CHEW Chew 2 tablets by mouth daily as needed (indigestion).    [provider]  aspirin 81 MG chewable tablet Chew 1 tablet (81 mg total) by mouth 2 (two) times daily. Patient taking differently: Chew 81 mg by mouth daily.  01/15/17   Mcarthur Rossetti, MD  cephALEXin (KEFLEX) 500 MG capsule Take 1 capsule (500 mg total) by mouth 2 (two) times daily. X 3 days. Begin day  before next Urology appointment. 05/24/18   Alexis Frock, MD  Diclofenac Sodium 2 % SOLN Place 2 Squirts onto the skin 2 (two) times daily. 07/16/17   Pete Pelt, PA-C  ketorolac (TORADOL) 10 MG tablet Take 1 tablet (10 mg total) by mouth every 6 (six) hours as needed for moderate pain. Or stent colic post-operatively. 05/24/18   Alexis Frock, MD  losartan-hydrochlorothiazide (HYZAAR) 100-12.5 MG tablet Take 1 tablet by mouth daily.  08/23/15   [provider]  meloxicam (MOBIC) 7.5 MG tablet Take 1 tablet (7.5 mg total) by mouth daily as needed. 06/18/17   Pete Pelt, PA-C  metFORMIN  (GLUCOPHAGE) 500 MG tablet Take 500 mg by mouth daily with breakfast.     [provider]  methocarbamol (ROBAXIN) 500 MG tablet Take 1 tablet (500 mg total) by mouth daily as needed. Patient not taking: Reported on 04/27/2018 06/18/17   Pete Pelt, PA-C  omeprazole (PRILOSEC) 20 MG capsule Take 1 capsule (20 mg total) by mouth daily. Patient not taking: Reported on 04/27/2018 11/17/17   Tegeler, Gwenyth Allegra, MD  ondansetron (ZOFRAN) 4 MG tablet Take 1 tablet (4 mg total) by mouth every 8 (eight) hours as needed for nausea or vomiting. Patient not taking: Reported on 04/27/2018 11/17/17   Tegeler, Gwenyth Allegra, MD  oxyCODONE-acetaminophen (PERCOCET/ROXICET) 5-325 MG tablet Take 1-2 tablets by mouth every 6 (six) hours as needed for severe pain. Post-operatively 05/24/18   Alexis Frock, MD  senna-docusate (SENOKOT-S) 8.6-50 MG tablet Take 1 tablet by mouth 2 (two) times daily. While taking strongest pain meds to prevent constipation. 05/24/18   Alexis Frock, MD  Vitamin D, Ergocalciferol, (DRISDOL) 50000 units CAPS capsule TK ONE C PO ONCE A WK 08/06/17   [provider]    Family History Family History  Problem Relation Age of Onset  . Esophageal cancer Maternal Aunt   . Colon cancer Neg Hx   . Stomach cancer Neg Hx   . Rectal cancer Neg Hx     Social History Social History   Tobacco Use  . Smoking status: Never Smoker  . Smokeless tobacco: Never Used  Substance Use Topics  . Alcohol use: No    Alcohol/week: 0.0 standard drinks  . Drug use: No     Allergies   Patient has no known allergies.   Review of Systems Review of Systems  Neurological: Positive for dizziness and light-headedness.  All other systems reviewed and are negative.    Physical Exam Updated Vital Signs BP (!) 186/72   Pulse 65   Temp 98.1 F (36.7 C)   Resp 15   Ht 5\' 4"  (1.626 m)   Wt 119.3 kg   SpO2 100%   BMI 45.14 kg/m   Physical Exam  Constitutional: She is oriented  to person, place, and time. She appears well-developed and well-nourished. No distress.  HENT:  Head: Normocephalic and atraumatic.  Right Ear: Hearing normal.  Left Ear: Hearing normal.  Nose: Nose normal.  Mouth/Throat: Oropharynx is clear and moist and mucous membranes are normal.  Eyes: Pupils are equal, round, and reactive to light. Conjunctivae and EOM are normal.  Neck: Normal range of motion. Neck supple.  Cardiovascular: Regular rhythm, S1 normal and S2 normal. Exam reveals no gallop and no friction rub.  No murmur heard. Pulmonary/Chest: Effort normal and breath sounds normal. No respiratory distress. She exhibits no tenderness.  Abdominal: Soft. Normal appearance and bowel sounds are normal. There is no hepatosplenomegaly. There is  no tenderness. There is no rebound, no guarding, no tenderness at McBurney's point and negative Murphy's sign. No hernia.  Musculoskeletal: Normal range of motion.  Neurological: She is alert and oriented to person, place, and time. She has normal strength. No cranial nerve deficit or sensory deficit. Coordination normal. GCS eye subscore is 4. GCS verbal subscore is 5. GCS motor subscore is 6.  Skin: Skin is warm, dry and intact. No rash noted. No cyanosis.  Psychiatric: She has a normal mood and affect. Her speech is normal and behavior is normal. Thought content normal.  Nursing note and vitals reviewed.    ED Treatments / Results  Labs (all labs ordered are listed, but only abnormal results are displayed) Labs Reviewed  CBC - Abnormal; Notable for the following components:      Result Value   WBC 11.0 (*)    RBC 3.55 (*)    Hemoglobin 9.6 (*)    HCT 32.9 (*)    MCHC 29.2 (*)    All other components within normal limits  BASIC METABOLIC PANEL - Abnormal; Notable for the following components:   Glucose, Bld 131 (*)    BUN 25 (*)    Creatinine, Ser 1.05 (*)    GFR calc non Af Amer 53 (*)    All other components within normal limits    URINALYSIS, ROUTINE W REFLEX MICROSCOPIC - Abnormal; Notable for the following components:   Color, Urine STRAW (*)    Leukocytes, UA LARGE (*)    All other components within normal limits  CBG MONITORING, ED - Abnormal; Notable for the following components:   Glucose-Capillary 108 (*)    All other components within normal limits  URINE CULTURE    EKG EKG Interpretation  Date/Time:  Tuesday September 10 2018 04:40:28 EST Ventricular Rate:  63 PR Interval:    QRS Duration: 89 QT Interval:  405 QTC Calculation: 415 R Axis:   45 Text Interpretation:  Sinus rhythm Borderline prolonged PR interval No significant change since last tracing Confirmed by Orpah Greek [id number]) on 09/10/2018 5:06:53 AM   Radiology No results found.  Procedures Procedures (including critical care time)  Medications Ordered in ED Medications  cloNIDine (CATAPRES) tablet 0.1 mg (has no administration in time range)     Initial Impression / Assessment and Plan / ED Course  I have reviewed the triage vital signs and the nursing notes.  Pertinent labs & imaging results that were available during my care of the patient were reviewed by me and considered in my medical decision making (see chart for details).     Patient presents for evaluation of dizziness.  She initially had vertiginous dizziness followed by simple lightheadedness and then complete resolution of symptoms without intervention.  Here in the ER she is without complaints.  Neurologic exam is normal.  Basic work-up is unremarkable other than suggestion of possible urinary tract infection.  Blood pressure is elevated as well.  She was treated with clonidine here, is due for her morning meds.  She reports that her normal blood pressure is around 145/80.  Do not believe she needs any further work-up for her dizziness.  She will need to follow-up with her primary doctor for recheck of blood pressure.  Will treat urine with Keflex.  Final  Clinical Impressions(s) / ED Diagnoses   Final diagnoses:  Dizziness  Essential hypertension  Acute cystitis without hematuria    ED Discharge Orders    None  Orpah Greek, MD 09/10/18 [phone]

## 2018-09-10 NOTE — ED Notes (Signed)
Pt states she wants to wait to take her Catapres after she settles in bed, that maybe her blood pressure will come down.

## 2018-09-11 LAB — URINE CULTURE

## 2018-11-13 ENCOUNTER — Ambulatory Visit (INDEPENDENT_AMBULATORY_CARE_PROVIDER_SITE_OTHER): Payer: Self-pay

## 2018-11-13 ENCOUNTER — Ambulatory Visit (INDEPENDENT_AMBULATORY_CARE_PROVIDER_SITE_OTHER): Payer: Medicare Other | Admitting: Physician Assistant

## 2018-11-13 ENCOUNTER — Encounter (INDEPENDENT_AMBULATORY_CARE_PROVIDER_SITE_OTHER): Payer: Self-pay | Admitting: Physician Assistant

## 2018-11-13 DIAGNOSIS — M545 Low back pain: Secondary | ICD-10-CM

## 2018-11-13 DIAGNOSIS — G8929 Other chronic pain: Secondary | ICD-10-CM

## 2018-11-13 NOTE — Progress Notes (Signed)
Office Visit Note   Patient: AVERYANNA Velasquez           Date of Birth: Feb 27, 1951           MRN: [id number] Visit Date: 11/13/2018              Requested by: Nolene Ebbs, MD [address] [place], [place] [address] PCP: Nolene Ebbs, MD   Assessment & Plan: Visit Diagnoses:  1. Chronic bilateral low back pain without sciatica     Plan: We will obtain an MRI of her lumbar spine to rule out spinal stenosis as a source of her low back pain.  Have her follow-up once the MRI is available.  Questions encouraged and answered.  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  Follow-Up Instructions: Return for After MRI.   Orders:  Orders Placed This Encounter  Procedures  . XR Lumbar Spine 2-3 Views   No orders of the defined types were placed in this encounter.     Procedures: No procedures performed   Clinical Data: No additional findings.   Subjective: Chief Complaint  Patient presents with  . Lower Back - Pain, Follow-up    HPI Debbie Velasquez returns today complaining still of low back pain.  She states she just has not followed up and has been dealing with the pain in her low back.  Pain does not radiate down either leg.  Is worse with prolonged standing walking relieved with rest.  She has had no injury to her back.  She did undergo physical therapy and is taken anti-inflammatories and despite this continues to have low back pain she denies any bowel bladder dysfunction.  Pain does not awaken her at night. Review of Systems  Constitutional: Negative for chills and fever.  Respiratory: Negative for shortness of breath.   Cardiovascular: Negative for chest pain.  Genitourinary: Positive for urgency. Negative for frequency.  Musculoskeletal: Positive for back pain.     Objective: Vital Signs: There were no vitals taken for this visit.  Physical Exam Constitutional:      Appearance: She is not ill-appearing or diaphoretic.  Cardiovascular:   Pulses: Normal pulses.  Neurological:     Mental Status: She is alert and oriented to person, place, and time.     Ortho Exam Bilateral hips good range of motion without pain.  Nontender over the trochanteric region of both hips.  Negative straight leg raise bilaterally.  She has tenderness in the paraspinous region lower lumbar on the left.  Deep tendon patellar reflexes are 2+ bilaterally equal symmetric.  Deep tendon reflexes at the ankles are 2+ and equal and symmetric bilaterally.  5 out of 5 strength throughout lower extremities against resistance. Bilateral knees good range of motion.  Left knee slight anterior drawer with the knee bent at 90 Lockman's is negative.  Quad atrophy bilaterally. Specialty Comments:  No specialty comments available.  Imaging: Xr Lumbar Spine 2-3 Views  Result Date: 11/13/2018 Lumbar spine 2 views: Degenerative facet changes lower lumbar spine.  Spine no listhesis at L4- L 5 grade 1 unchanged from prior films in 2018.  No acute fractures bony abnormalities otherwise.    PMFS History: Patient Active Problem List   Diagnosis Date Noted  . Chronic low back pain without sciatica 06/18/2017  . Primary osteoarthritis of right knee 03/26/2017  . Presence of left artificial knee joint 02/26/2017  . Unilateral primary osteoarthritis, left knee 01/12/2017  . 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/12/2017  .  Renal mass 12/22/2015   Past Medical History:  Diagnosis Date  . Arthritis   . Cancer ([place])    Left renal mass  . GERD (gastroesophageal reflux disease)   . History of kidney stones    x3 -remains with one on right.   Marland Kitchen Hx of seasonal allergies    rare flare ups  . Hypertension   . Left renal mass   . Obesity   . Pneumonia    hx of 20 years ago  . Right ureteral stone   . Type 2 diabetes mellitus (HCC)    Type 2  . Wears glasses   . Wears partial dentures    upper    Family History  Problem Relation Age of Onset  . Esophageal cancer  Maternal Aunt   . Colon cancer Neg Hx   . Stomach cancer Neg Hx   . Rectal cancer Neg Hx     Past Surgical History:  Procedure Laterality Date  . CESAREAN SECTION  yrs ago  . CYSTOSCOPY WITH RETROGRADE PYELOGRAM, URETEROSCOPY AND STENT PLACEMENT Left 05/24/2018   Procedure: CYSTOSCOPY WITH RETROGRADE PYELOGRAM, URETEROSCOPY AND STENT PLACEMENT;  Surgeon: Alexis Frock, MD;  Location: [facility];  Service: Urology;  Laterality: Left;  . HOLMIUM LASER APPLICATION Left 12/25/4560   Procedure: HOLMIUM LASER APPLICATION;  Surgeon: Alexis Frock, MD;  Location: [facility];  Service: Urology;  Laterality: Left;  . ROBOTIC ASSITED PARTIAL NEPHRECTOMY Left 12/22/2015   Procedure: XI ROBOTIC ASSITED PARTIAL NEPHRECTOMY;  Surgeon: Alexis Frock, MD;  Location: [facility];  Service: Urology;  Laterality: Left;  . TOTAL ABDOMINAL HYSTERECTOMY W/ BILATERAL SALPINGOOPHORECTOMY  1980's  . TOTAL KNEE ARTHROPLASTY Left 01/12/2017   Procedure: LEFT TOTAL KNEE ARTHROPLASTY and Right knee steroid injection;  Surgeon: Mcarthur Rossetti, MD;  Location: [facility];  Service: Orthopedics;  Laterality: Left;   Social History   Occupational History  . Not on file  Tobacco Use  . Smoking status: Never Smoker  . Smokeless tobacco: Never Used  Substance and Sexual Activity  . Alcohol use: No    Alcohol/week: 0.0 standard drinks  . Drug use: No  . Sexual activity: Not Currently

## 2018-11-14 ENCOUNTER — Other Ambulatory Visit (INDEPENDENT_AMBULATORY_CARE_PROVIDER_SITE_OTHER): Payer: Self-pay

## 2018-11-14 DIAGNOSIS — M4807 Spinal stenosis, lumbosacral region: Secondary | ICD-10-CM

## 2018-11-27 ENCOUNTER — Ambulatory Visit
Admission: RE | Admit: 2018-11-27 | Discharge: 2018-11-27 | Disposition: A | Discharge status: Home / Self Care | Payer: Medicare Other | Source: Ambulatory Visit | Attending: Physician Assistant | Admitting: Physician Assistant

## 2018-11-27 DIAGNOSIS — M4807 Spinal stenosis, lumbosacral region: Secondary | ICD-10-CM

## 2018-12-02 ENCOUNTER — Telehealth (INDEPENDENT_AMBULATORY_CARE_PROVIDER_SITE_OTHER): Payer: Self-pay | Admitting: Orthopaedic Surgery

## 2018-12-02 NOTE — Telephone Encounter (Signed)
Returned call to patient left message with spouse to return call

## 2018-12-17 ENCOUNTER — Encounter (INDEPENDENT_AMBULATORY_CARE_PROVIDER_SITE_OTHER): Payer: Self-pay | Admitting: Orthopaedic Surgery

## 2018-12-17 ENCOUNTER — Ambulatory Visit (INDEPENDENT_AMBULATORY_CARE_PROVIDER_SITE_OTHER): Payer: Medicare Other | Admitting: Orthopaedic Surgery

## 2018-12-17 VITALS — Ht 64.0 in | Wt 253.0 lb

## 2018-12-17 DIAGNOSIS — M47816 Spondylosis without myelopathy or radiculopathy, lumbar region: Secondary | ICD-10-CM | POA: Diagnosis not present

## 2018-12-17 DIAGNOSIS — M545 Low back pain: Secondary | ICD-10-CM | POA: Diagnosis not present

## 2018-12-17 DIAGNOSIS — M4807 Spinal stenosis, lumbosacral region: Secondary | ICD-10-CM | POA: Diagnosis not present

## 2018-12-17 DIAGNOSIS — G8929 Other chronic pain: Secondary | ICD-10-CM | POA: Diagnosis not present

## 2018-12-17 NOTE — Progress Notes (Signed)
Office Visit Note   Patient: Debbie Velasquez           Date of Birth: [birth year]           MRN: [id number] Visit Date: 12/17/2018              Requested by: Nolene Ebbs, MD [address] [place], [place] [address] PCP: Nolene Ebbs, MD   Assessment & Plan: Visit Diagnoses:  1. Spinal stenosis of lumbosacral region   2. Chronic bilateral low back pain without sciatica   3. Facet degeneration of lumbar region     Plan: Patient is MRI scan shows significant facet arthropathy at L4-5 with trace anterolisthesis.  We will set her up for facet injection bilaterally at L4-5 and she can follow-up after the injections.  Follow-Up Instructions: Return in about 2 months (around 02/15/2019).   Orders:  Orders Placed This Encounter  Procedures  . Ambulatory referral to Physical Medicine Rehab   No orders of the defined types were placed in this encounter.     Procedures: No procedures performed   Clinical Data: No additional findings.   Subjective: Chief Complaint  Patient presents with  . Lower Back - Pain, Follow-up    MRI Lumbar Review    HPI 68 year old female with ongoing problems with significant back pain.  She denies leg pain no numbness or tingling in her legs.  Previous left total knee arthroplasty.  She has some primary osteoarthritis right knee.  She has been through physical therapy without improvement in her back symptoms.  MRI scan was obtained and is available for review.  Patient has been on diclofenac, Robaxin, Mobic, physical therapy without relief.  Patient has difficulty getting upright when she first gets up problems with back stiffness.  Increased pain with turning and twisting.  Review of Systems 14 point update performed.  Knee osteoarthritis previous total knee arthroplasty.  Renal mass.  Low back pain with facet arthropathy.   Objective: Vital Signs: Ht 5\' 4"  (1.626 m)   Wt 253 lb (114.8 kg)   BMI 43.43 kg/m   Physical  Exam Constitutional:      Appearance: She is well-developed.  HENT:     Head: Normocephalic.     Right Ear: External ear normal.     Left Ear: External ear normal.  Eyes:     Pupils: Pupils are equal, round, and reactive to light.  Neck:     Thyroid: No thyromegaly.     Trachea: No tracheal deviation.  Cardiovascular:     Rate and Rhythm: Normal rate.  Pulmonary:     Effort: Pulmonary effort is normal.  Abdominal:     Palpations: Abdomen is soft.  Skin:    General: Skin is warm and dry.  Neurological:     Mental Status: She is alert and oriented to person, place, and time.  Psychiatric:        Behavior: Behavior normal.     Ortho Exam patient has pain with palpation lumbosacral junction minimal sciatic notch tenderness.  Negative straight leg raising 90 degrees knee and ankle jerk are intact well-healed total knee arthroplasty incision left knee.  Crepitus with range of motion right knee.  Specialty Comments:  No specialty comments available.  Imaging: CLINICAL DATA:  Low back pain for 1 year.  History of renal cancer.  EXAM: MRI LUMBAR SPINE WITHOUT CONTRAST  TECHNIQUE: Multiplanar, multisequence MR imaging of the lumbar spine was performed. No intravenous contrast was administered.  COMPARISON:  Lumbar  spine radiographs 11/13/2018. CT abdomen and pelvis 05/19/2018. CT abdomen 09/06/2017.  FINDINGS: Segmentation:  Standard.  Alignment: Facet mediated anterolisthesis of L4 on L5 measuring 2 mm.  Vertebrae: No fracture or suspicious osseous lesion. Mild facet edema at L4-5.  Conus medullaris and cauda equina: Conus extends to the L1 level. Conus and cauda equina appear normal.  Paraspinal and other soft tissues: Partially visualized contour deformity of the posterior interpolar left kidney related to prior partial nephrectomy. Approximately 2.2 cm T2 hyperintense right interpolar renal mass, similar in size to the 2018 CT with appearance on that  examination compatible with renal cell carcinoma.  Disc levels:  Disc desiccation from L2-3 to L4-5. Mild disc space narrowing at L3-4 and L4-5.  T12-L1: Mild facet arthrosis without disc herniation or stenosis.  L1-2: Mild facet arthrosis without disc herniation or stenosis.  L2-3: Minimal disc bulging and mild facet arthrosis without stenosis.  L3-4: Minimal disc bulging and mild-to-moderate facet arthrosis without stenosis.  L4-5: Anterolisthesis with disc uncovering and severe facet arthrosis without stenosis.  L5-S1: Mild-to-moderate facet arthrosis without disc herniation or stenosis.  IMPRESSION: 1. Diffuse lumbar facet arthrosis, severe at L4-5 with associated trace anterolisthesis. 2. Minimal disc degeneration.  No stenosis.   Electronically Signed   By: Logan Bores M.D.   On: 11/27/2018 10:18    PMFS History: Patient Active Problem List   Diagnosis Date Noted  . Chronic low back pain without sciatica 06/18/2017  . Primary osteoarthritis of right knee 03/26/2017  . Presence of left artificial knee joint 02/26/2017  . Unilateral primary osteoarthritis, left knee 01/12/2017  . 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/12/2017  . Renal mass 12/22/2015   Past Medical History:  Diagnosis Date  . Arthritis   . Cancer ([place])    Left renal mass  . GERD (gastroesophageal reflux disease)   . History of kidney stones    x3 -remains with one on right.   Marland Kitchen Hx of seasonal allergies    rare flare ups  . Hypertension   . Left renal mass   . Obesity   . Pneumonia    hx of 20 years ago  . Right ureteral stone   . Type 2 diabetes mellitus (HCC)    Type 2  . Wears glasses   . Wears partial dentures    upper    Family History  Problem Relation Age of Onset  . Esophageal cancer Maternal Aunt   . Colon cancer Neg Hx   . Stomach cancer Neg Hx   . Rectal cancer Neg Hx     Past Surgical History:  Procedure Laterality Date  . CESAREAN SECTION   yrs ago  . CYSTOSCOPY WITH RETROGRADE PYELOGRAM, URETEROSCOPY AND STENT PLACEMENT Left 05/24/2018   Procedure: CYSTOSCOPY WITH RETROGRADE PYELOGRAM, URETEROSCOPY AND STENT PLACEMENT;  Surgeon: Alexis Frock, MD;  [facility];  Service: Urology;  Laterality: Left;  . HOLMIUM LASER APPLICATION Left 10/31/2991   Procedure: HOLMIUM LASER APPLICATION;  Surgeon: Alexis Frock, MD;  [facility];  Service: Urology;  Laterality: Left;  . ROBOTIC ASSITED PARTIAL NEPHRECTOMY Left 12/22/2015   Procedure: XI ROBOTIC ASSITED PARTIAL NEPHRECTOMY;  Surgeon: Alexis Frock, MD;  [facility];  Service: Urology;  Laterality: Left;  . TOTAL ABDOMINAL HYSTERECTOMY W/ BILATERAL SALPINGOOPHORECTOMY  1980's  . TOTAL KNEE ARTHROPLASTY Left 01/12/2017   Procedure: LEFT TOTAL KNEE ARTHROPLASTY and Right knee steroid injection;  Surgeon: Mcarthur Rossetti, MD;  [facility];  Service: Orthopedics;  Laterality:  Left;   Social History   Occupational History  . Not on file  Tobacco Use  . Smoking status: Never Smoker  . Smokeless tobacco: Never Used  Substance and Sexual Activity  . Alcohol use: No    Alcohol/week: 0.0 standard drinks  . Drug use: No  . Sexual activity: Not Currently

## 2018-12-18 ENCOUNTER — Encounter (INDEPENDENT_AMBULATORY_CARE_PROVIDER_SITE_OTHER): Payer: Self-pay | Admitting: Orthopaedic Surgery

## 2018-12-18 DIAGNOSIS — M47816 Spondylosis without myelopathy or radiculopathy, lumbar region: Secondary | ICD-10-CM | POA: Insufficient documentation

## 2019-01-02 ENCOUNTER — Other Ambulatory Visit: Payer: Self-pay

## 2019-01-02 ENCOUNTER — Ambulatory Visit (INDEPENDENT_AMBULATORY_CARE_PROVIDER_SITE_OTHER): Payer: Self-pay

## 2019-01-02 ENCOUNTER — Encounter (INDEPENDENT_AMBULATORY_CARE_PROVIDER_SITE_OTHER): Payer: Self-pay | Admitting: Physical Medicine and Rehabilitation

## 2019-01-02 ENCOUNTER — Ambulatory Visit (INDEPENDENT_AMBULATORY_CARE_PROVIDER_SITE_OTHER): Payer: Medicare Other | Admitting: Physical Medicine and Rehabilitation

## 2019-01-02 VITALS — BP 148/75 | HR 93 | Temp 98.1°F

## 2019-01-02 DIAGNOSIS — M47816 Spondylosis without myelopathy or radiculopathy, lumbar region: Secondary | ICD-10-CM

## 2019-01-02 DIAGNOSIS — G8929 Other chronic pain: Secondary | ICD-10-CM

## 2019-01-02 DIAGNOSIS — M545 Low back pain: Secondary | ICD-10-CM | POA: Diagnosis not present

## 2019-01-02 MED ORDER — METHYLPREDNISOLONE ACETATE 80 MG/ML IJ SUSP
80.0000 mg | Freq: Once | INTRAMUSCULAR | Status: AC
  Administered 2019-01-02: 80 mg

## 2019-01-02 NOTE — Procedures (Signed)
Lumbar Facet Joint Intra-Articular Injection(s) with Fluoroscopic Guidance  Patient: Debbie Velasquez      Date of Birth: 07-10-1951 MRN: [id number] PCP: Nolene Ebbs, MD      Visit Date: 01/02/2019   Universal Protocol:    Date/Time: 01/02/2019  Consent Given By: the patient  Position: PRONE   Additional Comments: Vital signs were monitored before and after the procedure. Patient was prepped and draped in the usual sterile fashion. The correct patient, procedure, and site was verified.   Injection Procedure Details:  Procedure Site One Meds Administered:  Meds ordered this encounter  Medications  . methylPREDNISolone acetate (DEPO-MEDROL) injection 80 mg     Laterality: Bilateral  Location/Site:  L4-L5  Needle size: 22 guage  Needle type: Spinal  Needle Placement: Articular  Findings:  -Comments: Excellent flow of contrast producing a partial arthrogram.  Procedure Details: The fluoroscope beam is vertically oriented in AP, and the inferior recess is visualized beneath the lower pole of the inferior apophyseal process, which represents the target point for needle insertion. When direct visualization is difficult the target point is located at the medial projection of the vertebral pedicle. The region overlying each aforementioned target is locally anesthetized with a 1 to 2 ml. volume of 1% Lidocaine without Epinephrine.   The spinal needle was inserted into each of the above mentioned facet joints using biplanar fluoroscopic guidance. A 0.25 to 0.5 ml. volume of Isovue-250 was injected and a partial facet joint arthrogram was obtained. A single spot film was obtained of the resulting arthrogram.    One to 1.25 ml of the steroid/anesthetic solution was then injected into each of the facet joints noted above.   Additional Comments:  The patient tolerated the procedure well Dressing: 2 x 2 sterile gauze and Band-Aid    Post-procedure details: Patient was  observed during the procedure. Post-procedure instructions were reviewed.  Patient left the clinic in stable condition.

## 2019-01-02 NOTE — Progress Notes (Signed)
.  Numeric Pain Rating Scale and Functional Assessment Average Pain 7   In the last MONTH (on 0-10 scale) has pain interfered with the following?  1. General activity like being  able to carry out your everyday physical activities such as walking, climbing stairs, carrying groceries, or moving a chair?  Rating(8)   +Driver, -BT, -Dye Allergies.  

## 2019-01-06 NOTE — Progress Notes (Signed)
Debbie Velasquez - 68 y.o. female MRN [id number]  Date of birth: [birth year]  Office Visit Note: Visit Date: 01/02/2019 PCP: Nolene Ebbs, MD Referred by: Nolene Ebbs, MD  Subjective: Chief Complaint  Patient presents with  . Lower Back - Pain  . Right Thigh - Pain   HPI: Debbie Velasquez is a 68 y.o. female who comes in today At the request of Dr. Rodell Perna for diagnostic and therapeutic bilateral L4-5 facet block.  She has MRI showing severe facet arthropathy and has low back pain which is failed conservative care.  Please review his notes for further details.  She does have pain across the lower back worse with standing and facet loading.  No radicular pain some pain to the thigh.  No paresthesias.  ROS Otherwise per HPI.  Assessment & Plan: Visit Diagnoses:  1. Spondylosis without myelopathy or radiculopathy, lumbar region   2. Chronic bilateral low back pain without sciatica     Plan: No additional findings.   Meds & Orders:  Meds ordered this encounter  Medications  . methylPREDNISolone acetate (DEPO-MEDROL) injection 80 mg    Orders Placed This Encounter  Procedures  . Facet Injection  . XR C-ARM NO REPORT    Follow-up: Return if symptoms worsen or fail to improve.   Procedures: No procedures performed  Lumbar Facet Joint Intra-Articular Injection(s) with Fluoroscopic Guidance  Patient: Debbie Velasquez      Date of Birth: 1951/04/28 MRN: [id number] PCP: Nolene Ebbs, MD      Visit Date: 01/02/2019   Universal Protocol:    Date/Time: 01/02/2019  Consent Given By: the patient  Position: PRONE   Additional Comments: Vital signs were monitored before and after the procedure. Patient was prepped and draped in the usual sterile fashion. The correct patient, procedure, and site was verified.   Injection Procedure Details:  Procedure Site One Meds Administered:  Meds ordered this encounter  Medications  . methylPREDNISolone acetate  (DEPO-MEDROL) injection 80 mg     Laterality: Bilateral  Location/Site:  L4-L5  Needle size: 22 guage  Needle type: Spinal  Needle Placement: Articular  Findings:  -Comments: Excellent flow of contrast producing a partial arthrogram.  Procedure Details: The fluoroscope beam is vertically oriented in AP, and the inferior recess is visualized beneath the lower pole of the inferior apophyseal process, which represents the target point for needle insertion. When direct visualization is difficult the target point is located at the medial projection of the vertebral pedicle. The region overlying each aforementioned target is locally anesthetized with a 1 to 2 ml. volume of 1% Lidocaine without Epinephrine.   The spinal needle was inserted into each of the above mentioned facet joints using biplanar fluoroscopic guidance. A 0.25 to 0.5 ml. volume of Isovue-250 was injected and a partial facet joint arthrogram was obtained. A single spot film was obtained of the resulting arthrogram.    One to 1.25 ml of the steroid/anesthetic solution was then injected into each of the facet joints noted above.   Additional Comments:  The patient tolerated the procedure well Dressing: 2 x 2 sterile gauze and Band-Aid    Post-procedure details: Patient was observed during the procedure. Post-procedure instructions were reviewed.  Patient left the clinic in stable condition.     Clinical History: MRI LUMBAR SPINE WITHOUT CONTRAST  TECHNIQUE: Multiplanar, multisequence MR imaging of the lumbar spine was performed. No intravenous contrast was administered.  COMPARISON:  Lumbar spine radiographs 11/13/2018. CT abdomen and pelvis  05/19/2018. CT abdomen 09/06/2017.  FINDINGS: Segmentation:  Standard.  Alignment: Facet mediated anterolisthesis of L4 on L5 measuring 2 mm.  Vertebrae: No fracture or suspicious osseous lesion. Mild facet edema at L4-5.  Conus medullaris and cauda equina:  Conus extends to the L1 level. Conus and cauda equina appear normal.  Paraspinal and other soft tissues: Partially visualized contour deformity of the posterior interpolar left kidney related to prior partial nephrectomy. Approximately 2.2 cm T2 hyperintense right interpolar renal mass, similar in size to the 2018 CT with appearance on that examination compatible with renal cell carcinoma.  Disc levels:  Disc desiccation from L2-3 to L4-5. Mild disc space narrowing at L3-4 and L4-5.  T12-L1: Mild facet arthrosis without disc herniation or stenosis.  L1-2: Mild facet arthrosis without disc herniation or stenosis.  L2-3: Minimal disc bulging and mild facet arthrosis without stenosis.  L3-4: Minimal disc bulging and mild-to-moderate facet arthrosis without stenosis.  L4-5: Anterolisthesis with disc uncovering and severe facet arthrosis without stenosis.  L5-S1: Mild-to-moderate facet arthrosis without disc herniation or stenosis.  IMPRESSION: 1. Diffuse lumbar facet arthrosis, severe at L4-5 with associated trace anterolisthesis. 2. Minimal disc degeneration.  No stenosis.   Electronically Signed   By: Logan Bores M.D.   On: 11/27/2018 10:18   She reports that she has never smoked. She has never used smokeless tobacco.  Recent Labs    05/24/18 1414  HGBA1C 6.0*    Objective:  VS:  HT:    WT:   BMI:     BP:(!) 148/75  HR:93bpm  TEMP:98.1 F (36.7 C)(Oral)  RESP:  Physical Exam  Ortho Exam Imaging: No results found.  Past Medical/Family/Surgical/Social History: Medications & Allergies reviewed per EMR, new medications updated. Patient Active Problem List   Diagnosis Date Noted  . Facet degeneration of lumbar region 12/18/2018  . Chronic low back pain without sciatica 06/18/2017  . Primary osteoarthritis of right knee 03/26/2017  . Presence of left artificial knee joint 02/26/2017  . Unilateral primary osteoarthritis, left knee 01/12/2017  .  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/12/2017  . Renal mass 12/22/2015   Past Medical History:  Diagnosis Date  . Arthritis   . Cancer ([place])    Left renal mass  . GERD (gastroesophageal reflux disease)   . History of kidney stones    x3 -remains with one on right.   Marland Kitchen Hx of seasonal allergies    rare flare ups  . Hypertension   . Left renal mass   . Obesity   . Pneumonia    hx of 20 years ago  . Right ureteral stone   . Type 2 diabetes mellitus ([facility])    Type 2  . Wears glasses   . Wears partial dentures    upper   Family History  Problem Relation Age of Onset  . Esophageal cancer Maternal Aunt   . Colon cancer Neg Hx   . Stomach cancer Neg Hx   . Rectal cancer Neg Hx    Past Surgical History:  Procedure Laterality Date  . CESAREAN SECTION  yrs ago  . CYSTOSCOPY WITH RETROGRADE PYELOGRAM, URETEROSCOPY AND STENT PLACEMENT Left 05/24/2018   Procedure: CYSTOSCOPY WITH RETROGRADE PYELOGRAM, URETEROSCOPY AND STENT PLACEMENT;  Surgeon: Alexis Frock, MD;  Location: [facility];  Service: Urology;  Laterality: Left;  . HOLMIUM LASER APPLICATION Left 10/28/1094   Procedure: HOLMIUM LASER APPLICATION;  Surgeon: Alexis Frock, MD;  Location[facility];  Service: Urology;  Laterality: Left;  .  ROBOTIC ASSITED PARTIAL NEPHRECTOMY Left 12/22/2015   Procedure: XI ROBOTIC ASSITED PARTIAL NEPHRECTOMY;  Surgeon: Alexis Frock, MD;  Location[facility];  Service: Urology;  Laterality: Left;  . TOTAL ABDOMINAL HYSTERECTOMY W/ BILATERAL SALPINGOOPHORECTOMY  1980's  . TOTAL KNEE ARTHROPLASTY Left 01/12/2017   Procedure: LEFT TOTAL KNEE ARTHROPLASTY and Right knee steroid injection;  Surgeon: Mcarthur Rossetti, MD;  Location: [facility];  Service: Orthopedics;  Laterality: Left;   Social History   Occupational History  . Not on file  Tobacco Use  . Smoking status: Never Smoker  . Smokeless tobacco: Never Used  Substance and Sexual Activity  . Alcohol use: No    Alcohol/week: 0.0 standard  drinks  . Drug use: No  . Sexual activity: Not Currently

## 2019-02-13 ENCOUNTER — Other Ambulatory Visit: Payer: Self-pay

## 2019-02-13 ENCOUNTER — Emergency Department (HOSPITAL_BASED_OUTPATIENT_CLINIC_OR_DEPARTMENT_OTHER)
Admission: EM | Admit: 2019-02-13 | Discharge: 2019-02-13 | Discharge status: Against Medical Advice | Payer: Medicare Other | Attending: Emergency Medicine | Admitting: Emergency Medicine

## 2019-10-21 ENCOUNTER — Other Ambulatory Visit: Payer: Self-pay

## 2019-10-21 ENCOUNTER — Encounter (HOSPITAL_BASED_OUTPATIENT_CLINIC_OR_DEPARTMENT_OTHER): Payer: Self-pay | Admitting: Emergency Medicine

## 2019-10-21 ENCOUNTER — Emergency Department (HOSPITAL_BASED_OUTPATIENT_CLINIC_OR_DEPARTMENT_OTHER)
Admission: EM | Admit: 2019-10-21 | Discharge: 2019-10-21 | Disposition: A | Discharge status: Home / Self Care | Payer: Medicare Other | Attending: Emergency Medicine | Admitting: Emergency Medicine

## 2019-10-21 DIAGNOSIS — I10 Essential (primary) hypertension: Secondary | ICD-10-CM | POA: Insufficient documentation

## 2019-10-21 DIAGNOSIS — M79602 Pain in left arm: Secondary | ICD-10-CM | POA: Diagnosis not present

## 2019-10-21 DIAGNOSIS — Z79899 Other long term (current) drug therapy: Secondary | ICD-10-CM | POA: Insufficient documentation

## 2019-10-21 DIAGNOSIS — Z85528 Personal history of other malignant neoplasm of kidney: Secondary | ICD-10-CM | POA: Insufficient documentation

## 2019-10-21 DIAGNOSIS — E119 Type 2 diabetes mellitus without complications: Secondary | ICD-10-CM | POA: Diagnosis not present

## 2019-10-21 DIAGNOSIS — Z7982 Long term (current) use of aspirin: Secondary | ICD-10-CM | POA: Insufficient documentation

## 2019-10-21 DIAGNOSIS — M79601 Pain in right arm: Secondary | ICD-10-CM

## 2019-10-21 NOTE — Discharge Instructions (Signed)

## 2019-10-21 NOTE — ED Provider Notes (Signed)
[facility] EMERGENCY DEPARTMENT Provider Note   CSN: [id number] Arrival date & time: 10/21/19  0418     History Chief Complaint  Patient presents with  . Arm Problem    Debbie Velasquez is a 68 y.o. female.  HPI Patient presents for bilateral arm pain as well as mild right scapular pain.  She reports she gets bilateral arm pain frequently, it usually soreness and she thinks it is arthritis.  She does report lifting a vacuum and working earlier in the day which may have triggered this pain.  She also had pain in her right scapula which concerned her as it could be "my heart " She denies fever/vomiting.  No chest pain or shortness of breath.  No weakness or diaphoresis.  No arm or leg weakness.  She has occasional tingling in her arms. She is now improved. No other acute complaints    Past Medical History:  Diagnosis Date  . Arthritis   . Cancer ([place])    Left renal mass  . GERD (gastroesophageal reflux disease)   . History of kidney stones    x3 -remains with one on right.   Marland Kitchen Hx of seasonal allergies    rare flare ups  . Hypertension   . Left renal mass   . Obesity   . Pneumonia    hx of 20 years ago  . Right ureteral stone   . Type 2 diabetes mellitus (HCC)    Type 2  . Wears glasses   . Wears partial dentures    upper    Patient Active Problem List   Diagnosis Date Noted  . Facet degeneration of lumbar region 12/18/2018  . Chronic low back pain without sciatica 06/18/2017  . Primary osteoarthritis of right knee 03/26/2017  . Presence of left artificial knee joint 02/26/2017  . Unilateral primary osteoarthritis, left knee 01/12/2017  . 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/12/2017  . Renal mass 12/22/2015    Past Surgical History:  Procedure Laterality Date  . CESAREAN SECTION  yrs ago  . CYSTOSCOPY WITH RETROGRADE PYELOGRAM, URETEROSCOPY AND STENT PLACEMENT Left 05/24/2018   Procedure: CYSTOSCOPY WITH RETROGRADE PYELOGRAM, URETEROSCOPY  AND STENT PLACEMENT;  Surgeon: Alexis Frock, MD;  Location: [facility];  Service: Urology;  Laterality: Left;  . HOLMIUM LASER APPLICATION Left [id number]   Procedure: HOLMIUM LASER APPLICATION;  Surgeon: Alexis Frock, MD;  Location: [facility];  Service: Urology;  Laterality: Left;  . ROBOTIC ASSITED PARTIAL NEPHRECTOMY Left 12/22/2015   Procedure: XI ROBOTIC ASSITED PARTIAL NEPHRECTOMY;  Surgeon: Alexis Frock, MD;  Location: [facility];  Service: Urology;  Laterality: Left;  . TOTAL ABDOMINAL HYSTERECTOMY W/ BILATERAL SALPINGOOPHORECTOMY  1980's  . TOTAL KNEE ARTHROPLASTY Left 01/12/2017   Procedure: LEFT TOTAL KNEE ARTHROPLASTY and Right knee steroid injection;  Surgeon: Mcarthur Rossetti, MD;  Location: [facility];  Service: Orthopedics;  Laterality: Left;     OB History   No obstetric history on file.     Family History  Problem Relation Age of Onset  . Esophageal cancer Maternal Aunt   . Colon cancer Neg Hx   . Stomach cancer Neg Hx   . Rectal cancer Neg Hx     Social History   Tobacco Use  . Smoking status: Never Smoker  . Smokeless tobacco: Never Used  Substance Use Topics  . Alcohol use: No    Alcohol/week: 0.0 standard drinks  . Drug use: No    Home Medications Prior to Admission medications  Medication Sig Start Date End Date Taking? Authorizing Provider  Alum Hydroxide-Mag Trisilicate (GAVISCON) A999333 MG CHEW Chew 2 tablets by mouth daily as needed (indigestion).    [provider]  aspirin 81 MG chewable tablet Chew 1 tablet (81 mg total) by mouth 2 (two) times daily. Patient taking differently: Chew 81 mg by mouth daily.  01/15/17   Mcarthur Rossetti, MD  Diclofenac Sodium 2 % SOLN Place 2 Squirts onto the skin 2 (two) times daily. 07/16/17   Pete Pelt, PA-C  losartan-hydrochlorothiazide (HYZAAR) 100-12.5 MG tablet Take 1 tablet by mouth daily.  08/23/15   [provider]  metFORMIN (GLUCOPHAGE) 500 MG tablet Take 500 mg by mouth daily  with breakfast.     [provider]  omeprazole (PRILOSEC) 20 MG capsule Take 1 capsule (20 mg total) by mouth daily. 11/17/17   Tegeler, Gwenyth Allegra, MD  senna-docusate (SENOKOT-S) 8.6-50 MG tablet Take 1 tablet by mouth 2 (two) times daily. While taking strongest pain meds to prevent constipation. 05/24/18   Alexis Frock, MD  Vitamin D, Ergocalciferol, (DRISDOL) 50000 units CAPS capsule TK ONE C PO ONCE A WK 08/06/17   [provider]    Allergies    Patient has no known allergies.  Review of Systems   Review of Systems  Constitutional: Negative for diaphoresis.  Respiratory: Negative for shortness of breath.   Cardiovascular: Negative for chest pain.  Musculoskeletal: Positive for arthralgias and myalgias.  Neurological: Negative for weakness and headaches.  All other systems reviewed and are negative.   Physical Exam Updated Vital Signs BP 139/61   Pulse 61   Temp 98.1 F (36.7 C) (Oral)   Resp 13   Ht 1.626 m (5\' 4" )   Wt 115.7 kg   SpO2 100%   BMI 43.77 kg/m   Physical Exam CONSTITUTIONAL: Well developed/well nourished HEAD: Normocephalic/atraumatic EYES: EOMI NECK: supple no meningeal signs SPINE/BACK:entire spine nontender CV: S1/S2 noted, no murmurs/rubs/gallops noted LUNGS: Lungs are clear to auscultation bilaterally, no apparent distress ABDOMEN: soft, nontender, no rebound or guarding, bowel sounds noted throughout abdomen GU:no cva tenderness NEURO: Pt is awake/alert/appropriate, moves all extremitiesx4.  No facial droop.  Equal power (5/5) with hand grip, wrist flex/extension, elbow flex/extension, and equal power with shoulder abduction/adduction.  No focal sensory deficit to light touch is noted in either UE.   Equal (2+) biceps/brachioradialis/ reflex in bilateral UE EXTREMITIES: pulses normal/equal, full ROM, no edema or erythema to either upper extremity.  Mild tenderness palpation bilateral biceps.  Distal pulses equal and  intact SKIN: warm, color normal PSYCH: no abnormalities of mood noted, alert and oriented to situation  ED Results / Procedures / Treatments   Labs (all labs ordered are listed, but only abnormal results are displayed) Labs Reviewed - No data to display  EKG EKG Interpretation  Date/Time:  Tuesday October 21 2019 04:30:04 EST Ventricular Rate:  64 PR Interval:    QRS Duration: 93 QT Interval:  396 QTC Calculation: 409 R Axis:   52 Text Interpretation: Sinus rhythm Borderline prolonged PR interval Baseline wander in lead(s) III No significant change since last tracing Confirmed by Ripley Fraise [id number]) on 10/21/2019 4:36:00 AM   Radiology No results found.  Procedures Procedures    Medications Ordered in ED Medications - No data to display  ED Course  I have reviewed the triage vital signs and the nursing notes.     MDM Rules/Calculators/A&P  PT Very well-appearing.  EKG is unchanged.  Low suspicion for ACS at this time.  No signs of any acute neurovascular issue. Advised this could be related to cervical radiculopathy, but she has no focal weakness at this time, only pain and occasional tingling. I feel she is appropriate discharge home.  Patient agreed with plan Final Clinical Impression(s) / ED Diagnoses Final diagnoses:  Bilateral arm pain    Rx / DC Orders ED Discharge Orders    None       Ripley Fraise, MD 10/21/19 [phone]

## 2019-10-21 NOTE — ED Triage Notes (Signed)
Pt c/o arms tingling and feeling discomfort bilaterally since yesterday.

## 2020-02-02 ENCOUNTER — Encounter: Payer: Self-pay | Admitting: Orthopaedic Surgery

## 2020-02-02 ENCOUNTER — Ambulatory Visit: Payer: Self-pay

## 2020-02-02 ENCOUNTER — Other Ambulatory Visit: Payer: Self-pay

## 2020-02-02 ENCOUNTER — Ambulatory Visit (INDEPENDENT_AMBULATORY_CARE_PROVIDER_SITE_OTHER): Payer: Medicare PPO

## 2020-02-02 ENCOUNTER — Ambulatory Visit (INDEPENDENT_AMBULATORY_CARE_PROVIDER_SITE_OTHER): Payer: Medicare PPO | Admitting: Orthopaedic Surgery

## 2020-02-02 VITALS — Ht 64.0 in | Wt 256.0 lb

## 2020-02-02 DIAGNOSIS — M25562 Pain in left knee: Secondary | ICD-10-CM | POA: Diagnosis not present

## 2020-02-02 DIAGNOSIS — M25561 Pain in right knee: Secondary | ICD-10-CM

## 2020-02-02 DIAGNOSIS — Z96652 Presence of left artificial knee joint: Secondary | ICD-10-CM | POA: Diagnosis not present

## 2020-02-02 DIAGNOSIS — G8929 Other chronic pain: Secondary | ICD-10-CM | POA: Diagnosis not present

## 2020-02-02 DIAGNOSIS — M1711 Unilateral primary osteoarthritis, right knee: Secondary | ICD-10-CM | POA: Diagnosis not present

## 2020-02-02 MED ORDER — LIDOCAINE HCL 1 % IJ SOLN
3.0000 mL | INTRAMUSCULAR | Status: AC | PRN
  Administered 2020-02-02: 3 mL

## 2020-02-02 MED ORDER — METHYLPREDNISOLONE ACETATE 40 MG/ML IJ SUSP
40.0000 mg | INTRAMUSCULAR | Status: AC | PRN
  Administered 2020-02-02: 17:00:00 40 mg via INTRA_ARTICULAR

## 2020-02-02 NOTE — Progress Notes (Signed)
Office Visit Note   Patient: Debbie Velasquez           Date of Birth: 02/01/1951           MRN: [id number] Visit Date: 02/02/2020              Requested by: Nolene Ebbs, MD [address] [place],  [place] [address] PCP: Nolene Ebbs, MD   Assessment & Plan: Visit Diagnoses:  1. Chronic pain of left knee   2. Primary osteoarthritis of right knee   3. Status post total left knee replacement     Plan: We will have her go back to doing quad strengthening exercises and riding her stationary bike.  She will monitor her glucose levels closely over the next couple of days and she understands that the cortisone injection can cause these arise.  She needs to wait at least 3 months between injections.  Questions encouraged and answered at length.  Follow-up as needed.  Follow-Up Instructions: Return if symptoms worsen or fail to improve.   Orders:  Orders Placed This Encounter  Procedures  . Large Joint Inj  . XR Knee 1-2 Views Left  . XR Knee 1-2 Views Right   No orders of the defined types were placed in this encounter.     Procedures: Large Joint Inj: R knee on 02/02/2020 5:16 PM Indications: pain Details: 22 G 1.5 in needle, anterolateral approach  Arthrogram: No  Medications: 3 mL lidocaine 1 %; 40 mg methylPREDNISolone acetate 40 MG/ML Outcome: tolerated well, no immediate complications Procedure, treatment alternatives, risks and benefits explained, specific risks discussed. Consent was given by the patient. Immediately prior to procedure a time out was called to verify the correct patient, procedure, equipment, support staff and site/side marked as required. Patient was prepped and draped in the usual sterile fashion.       Clinical Data: No additional findings.   Subjective: Chief Complaint  Patient presents with  . Left Knee - Pain  . Right Knee - Pain    HPI Debbie Velasquez 1 month left lateral service comes in today with pain in both knees.   No new injury to either knee.  He had left total knee replacement in 2018 done well until recently.  She states she has no significant pain in the left knee just tightness in the blood she describes a swelling.  Right knee she has giving way but no significant pain.  Patient is diabetic but reports good control of her diabetes.  She is doing no exercise at this point time for either knee.  Review of Systems Denies any fevers chills shortness of breath.  Objective: Vital Signs: Ht 5\' 4"  (1.626 m)   Wt 256 lb (116.1 kg)   BMI 43.94 kg/m   Physical Exam Constitutional:      Appearance: She is not ill-appearing or diaphoretic.  Pulmonary:     Effort: Pulmonary effort is normal.  Neurological:     Mental Status: She is alert and oriented to person, place, and time.  Psychiatric:        Mood and Affect: Mood normal.     Ortho Exam Bilateral knees no abnormal warmth erythema or effusion.  She has tenderness of the medial joint line of both knees.  No instability valgus varus stressing of either knee.  Anterior drawer left knee negative.  Left knee with well-healed surgical incision.  Specialty Comments:  No specialty comments available.  Imaging: XR Knee 1-2 Views Left  Result  Date: 02/02/2020 Left knee: Status post left total knee arthroplasty with well-seated components.  No signs of acute fracture.  Knee is well located.  XR Knee 1-2 Views Right  Result Date: 02/02/2020 Right knee 2 views: No acute fractures.  Tricompartmental changes with near bone-on-bone medial compartment and severe patellofemoral changes.  Knee is well located.    PMFS History: Patient Active Problem List   Diagnosis Date Noted  . Facet degeneration of lumbar region 12/18/2018  . Chronic low back pain without sciatica 06/18/2017  . Primary osteoarthritis of right knee 03/26/2017  . Presence of left artificial knee joint 02/26/2017  . Unilateral primary osteoarthritis, left knee 01/12/2017  . Status  post total left knee replacement 01/12/2017  . Renal mass 12/22/2015   Past Medical History:  Diagnosis Date  . Arthritis   . Cancer ([place])    Left renal mass  . GERD (gastroesophageal reflux disease)   . History of kidney stones    x3 -remains with one on right.   Marland Kitchen Hx of seasonal allergies    rare flare ups  . Hypertension   . Left renal mass   . Obesity   . Pneumonia    hx of 20 years ago  . Right ureteral stone   . Type 2 diabetes mellitus ([facility])    Type 2  . Wears glasses   . Wears partial dentures    upper    Family History  Problem Relation Age of Onset  . Esophageal cancer Maternal Aunt   . Colon cancer Neg Hx   . Stomach cancer Neg Hx   . Rectal cancer Neg Hx     Past Surgical History:  Procedure Laterality Date  . CESAREAN SECTION  yrs ago  . CYSTOSCOPY WITH RETROGRADE PYELOGRAM, URETEROSCOPY AND STENT PLACEMENT Left 05/24/2018   Procedure: CYSTOSCOPY WITH RETROGRADE PYELOGRAM, URETEROSCOPY AND STENT PLACEMENT;  Surgeon: Alexis Frock, MD;  Location: [facility];  Service: Urology;  Laterality: Left;  . HOLMIUM LASER APPLICATION Left [id number]   Procedure: HOLMIUM LASER APPLICATION;  Surgeon: Alexis Frock, MD;  Location: [facility];  Service: Urology;  Laterality: Left;  . ROBOTIC ASSITED PARTIAL NEPHRECTOMY Left 12/22/2015   Procedure: XI ROBOTIC ASSITED PARTIAL NEPHRECTOMY;  Surgeon: Alexis Frock, MD;  Location: [facility];  Service: Urology;  Laterality: Left;  . TOTAL ABDOMINAL HYSTERECTOMY W/ BILATERAL SALPINGOOPHORECTOMY  1980's  . TOTAL KNEE ARTHROPLASTY Left 01/12/2017   Procedure: LEFT TOTAL KNEE ARTHROPLASTY and Right knee steroid injection;  Surgeon: Mcarthur Rossetti, MD;  Location: [facility];  Service: Orthopedics;  Laterality: Left;   Social History   Occupational History  . Not on file  Tobacco Use  . Smoking status: Never Smoker  . Smokeless tobacco: Never Used  Substance and Sexual Activity  . Alcohol use: No    Alcohol/week: 0.0 standard drinks    . Drug use: No  . Sexual activity: Not Currently

## 2020-02-22 ENCOUNTER — Other Ambulatory Visit: Payer: Self-pay

## 2020-02-22 ENCOUNTER — Emergency Department (HOSPITAL_BASED_OUTPATIENT_CLINIC_OR_DEPARTMENT_OTHER): Payer: Medicare PPO

## 2020-02-22 ENCOUNTER — Emergency Department (HOSPITAL_BASED_OUTPATIENT_CLINIC_OR_DEPARTMENT_OTHER)
Admission: EM | Admit: 2020-02-22 | Discharge: 2020-02-22 | Disposition: A | Discharge status: Home / Self Care | Payer: Medicare PPO | Attending: Emergency Medicine | Admitting: Emergency Medicine

## 2020-02-22 DIAGNOSIS — Z7982 Long term (current) use of aspirin: Secondary | ICD-10-CM | POA: Diagnosis not present

## 2020-02-22 DIAGNOSIS — I10 Essential (primary) hypertension: Secondary | ICD-10-CM | POA: Insufficient documentation

## 2020-02-22 DIAGNOSIS — N132 Hydronephrosis with renal and ureteral calculous obstruction: Secondary | ICD-10-CM | POA: Insufficient documentation

## 2020-02-22 DIAGNOSIS — Z79899 Other long term (current) drug therapy: Secondary | ICD-10-CM | POA: Diagnosis not present

## 2020-02-22 DIAGNOSIS — E119 Type 2 diabetes mellitus without complications: Secondary | ICD-10-CM | POA: Diagnosis not present

## 2020-02-22 DIAGNOSIS — N23 Unspecified renal colic: Secondary | ICD-10-CM

## 2020-02-22 DIAGNOSIS — R1032 Left lower quadrant pain: Secondary | ICD-10-CM | POA: Diagnosis present

## 2020-02-22 DIAGNOSIS — Z85528 Personal history of other malignant neoplasm of kidney: Secondary | ICD-10-CM | POA: Insufficient documentation

## 2020-02-22 DIAGNOSIS — Z7984 Long term (current) use of oral hypoglycemic drugs: Secondary | ICD-10-CM | POA: Diagnosis not present

## 2020-02-22 LAB — URINALYSIS, ROUTINE W REFLEX MICROSCOPIC
Bilirubin Urine: NEGATIVE
Glucose, UA: NEGATIVE mg/dL
Ketones, ur: NEGATIVE mg/dL
Nitrite: NEGATIVE
Protein, ur: NEGATIVE mg/dL
Specific Gravity, Urine: >1.03 — ABNORMAL HIGH (ref 1.005–1.030)
pH: 5.5 (ref 5.0–8.0)

## 2020-02-22 LAB — COMPREHENSIVE METABOLIC PANEL
ALT: 13 U/L (ref 0–44)
AST: 28 U/L (ref 15–41)
Albumin: 3.8 g/dL (ref 3.5–5.0)
Alkaline Phosphatase: 63 U/L (ref 38–126)
Anion gap: 11 (ref 5–15)
BUN: 27 mg/dL — ABNORMAL HIGH (ref 8–23)
CO2: 24 mmol/L (ref 22–32)
Calcium: 9.1 mg/dL (ref 8.9–10.3)
Chloride: 103 mmol/L (ref 98–111)
Creatinine, Ser: 1.57 mg/dL — ABNORMAL HIGH (ref 0.44–1.00)
GFR calc Af Amer: 38 mL/min — ABNORMAL LOW (ref >60)
GFR calc non Af Amer: 33 mL/min — ABNORMAL LOW (ref >60)
Glucose, Bld: 169 mg/dL — ABNORMAL HIGH (ref 70–99)
Potassium: 4.3 mmol/L (ref 3.5–5.1)
Sodium: 138 mmol/L (ref 135–145)
Total Bilirubin: 0.5 mg/dL (ref 0.3–1.2)
Total Protein: 8.1 g/dL (ref 6.5–8.1)

## 2020-02-22 LAB — CBC WITH DIFFERENTIAL/PLATELET
Abs Immature Granulocytes: 0.03 10*3/uL (ref 0.00–0.07)
Basophils Absolute: 0 10*3/uL (ref 0.0–0.1)
Basophils Relative: 0 %
Eosinophils Absolute: 0.1 10*3/uL (ref 0.0–0.5)
Eosinophils Relative: 0 %
HCT: 33.6 % — ABNORMAL LOW (ref 36.0–46.0)
Hemoglobin: 10.4 g/dL — ABNORMAL LOW (ref 12.0–15.0)
Immature Granulocytes: 0 %
Lymphocytes Relative: 10 %
Lymphs Abs: 1.1 10*3/uL (ref 0.7–4.0)
MCH: 27.2 pg (ref 26.0–34.0)
MCHC: 31 g/dL (ref 30.0–36.0)
MCV: 88 fL (ref 80.0–100.0)
Monocytes Absolute: 0.3 10*3/uL (ref 0.1–1.0)
Monocytes Relative: 2 %
Neutro Abs: 9.6 10*3/uL — ABNORMAL HIGH (ref 1.7–7.7)
Neutrophils Relative %: 88 %
Platelets: 345 10*3/uL (ref 150–400)
RBC: 3.82 MIL/uL — ABNORMAL LOW (ref 3.87–5.11)
RDW: 14 % (ref 11.5–15.5)
WBC: 11.1 10*3/uL — ABNORMAL HIGH (ref 4.0–10.5)
nRBC: 0 % (ref 0.0–0.2)

## 2020-02-22 LAB — LIPASE, BLOOD: Lipase: 54 U/L — ABNORMAL HIGH (ref 11–51)

## 2020-02-22 LAB — URINALYSIS, MICROSCOPIC (REFLEX)

## 2020-02-22 MED ORDER — MORPHINE SULFATE (PF) 2 MG/ML IV SOLN
2.0000 mg | Freq: Once | INTRAVENOUS | Status: AC
  Administered 2020-02-22: 2 mg via INTRAVENOUS
  Filled 2020-02-22: qty 1

## 2020-02-22 MED ORDER — TAMSULOSIN HCL 0.4 MG PO CAPS: 0.4000 mg | ORAL_CAPSULE | Freq: Every day | ORAL | 0 refills | Status: DC

## 2020-02-22 MED ORDER — ONDANSETRON 4 MG PO TBDP: 4.0000 mg | ORAL_TABLET | Freq: Three times a day (TID) | ORAL | 0 refills | Status: DC | PRN

## 2020-02-22 MED ORDER — ONDANSETRON HCL 4 MG/2ML IJ SOLN
4.0000 mg | Freq: Once | INTRAMUSCULAR | Status: AC
  Administered 2020-02-22: 4 mg via INTRAVENOUS
  Filled 2020-02-22: qty 2

## 2020-02-22 MED ORDER — OXYCODONE-ACETAMINOPHEN 5-325 MG PO TABS: 0.5000 | ORAL_TABLET | ORAL | 0 refills | Status: DC | PRN

## 2020-02-22 NOTE — ED Triage Notes (Signed)
Pt reports left sided flank pain and "gas pain" since yesterday. OTC gavaston PTA with some relief. Hx kidney stones.

## 2020-02-22 NOTE — ED Notes (Signed)
Pt c/o increase in pain level. Rates 7/10.  MD aware.

## 2020-02-22 NOTE — ED Provider Notes (Signed)
Pt signed out by Dr. Betsey Holiday pending symptomatic improvement.  Pt has 3 kidney stones on the left with the biggest at 13mm.  The pt has had to have kidney stones removed in the past by Dr. Tresa Moore.  Pt's pain is better.  UA neg for infection.  She does not want to come into the hospital.  She is told to call Dr. Tresa Moore tomorrow.  She knows to return or go to [facility] if sx worsen.  Pt's daughter in the room and is also ok with plan.   Isla Pence, MD 02/22/20 [phone]

## 2020-02-22 NOTE — ED Provider Notes (Signed)
[facility] EMERGENCY DEPARTMENT Provider Note   CSN: [id number] Arrival date & time: 02/22/20  0351     History Chief Complaint  Patient presents with  . Flank Pain    Debbie Velasquez is a 69 y.o. female.  Patient presents to the emergency department for evaluation of left-sided abdominal and flank pain.  Symptoms began yesterday.  Initially it felt like "gas pain".  She was taking over-the-counter Gaviscon with some relief but then tonight the pain significantly worsened.  She reports she became acutely nauseated and vomited 1 time.  She does have a history of kidney stones.        Past Medical History:  Diagnosis Date  . Arthritis   . Cancer ([place])    Left renal mass  . GERD (gastroesophageal reflux disease)   . History of kidney stones    x3 -remains with one on right.   Marland Kitchen Hx of seasonal allergies    rare flare ups  . Hypertension   . Left renal mass   . Obesity   . Pneumonia    hx of 20 years ago  . Right ureteral stone   . Type 2 diabetes mellitus ([facility])    Type 2  . Wears glasses   . Wears partial dentures    upper    Patient Active Problem List   Diagnosis Date Noted  . Facet degeneration of lumbar region 12/18/2018  . Chronic low back pain without sciatica 06/18/2017  . Primary osteoarthritis of right knee 03/26/2017  . Presence of left artificial knee joint 02/26/2017  . Unilateral primary osteoarthritis, left knee 01/12/2017  . 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/12/2017  . Renal mass 12/22/2015    Past Surgical History:  Procedure Laterality Date  . CESAREAN SECTION  yrs ago  . CYSTOSCOPY WITH RETROGRADE PYELOGRAM, URETEROSCOPY AND STENT PLACEMENT Left 05/24/2018   Procedure: CYSTOSCOPY WITH RETROGRADE PYELOGRAM, URETEROSCOPY AND STENT PLACEMENT;  Surgeon: Alexis Frock, MD;  Location: [facility];  Service: Urology;  Laterality: Left;  . HOLMIUM LASER APPLICATION Left [id number]   Procedure: HOLMIUM LASER APPLICATION;  Surgeon:  Alexis Frock, MD;  Location: [facility];  Service: Urology;  Laterality: Left;  . ROBOTIC ASSITED PARTIAL NEPHRECTOMY Left 12/22/2015   Procedure: XI ROBOTIC ASSITED PARTIAL NEPHRECTOMY;  Surgeon: Alexis Frock, MD;  Location: [facility];  Service: Urology;  Laterality: Left;  . TOTAL ABDOMINAL HYSTERECTOMY W/ BILATERAL SALPINGOOPHORECTOMY  1980's  . TOTAL KNEE ARTHROPLASTY Left 01/12/2017   Procedure: LEFT TOTAL KNEE ARTHROPLASTY and Right knee steroid injection;  Surgeon: Mcarthur Rossetti, MD;  Location: [facility];  Service: Orthopedics;  Laterality: Left;     OB History   No obstetric history on file.     Family History  Problem Relation Age of Onset  . Esophageal cancer Maternal Aunt   . Colon cancer Neg Hx   . Stomach cancer Neg Hx   . Rectal cancer Neg Hx     Social History   Tobacco Use  . Smoking status: Never Smoker  . Smokeless tobacco: Never Used  Substance Use Topics  . Alcohol use: No    Alcohol/week: 0.0 standard drinks  . Drug use: No    Home Medications Prior to Admission medications   Medication Sig Start Date End Date Taking? Authorizing Provider  Alum Hydroxide-Mag Trisilicate (GAVISCON) [id number] MG CHEW Chew 2 tablets by mouth daily as needed (indigestion).    [provider]  aspirin 81 MG chewable tablet Chew 1 tablet (  81 mg total) by mouth 2 (two) times daily. Patient taking differently: Chew 81 mg by mouth daily.  01/15/17   Mcarthur Rossetti, MD  Diclofenac Sodium 2 % SOLN Place 2 Squirts onto the skin 2 (two) times daily. 07/16/17   Pete Pelt, PA-C  losartan-hydrochlorothiazide (HYZAAR) 100-12.5 MG tablet Take 1 tablet by mouth daily.  08/23/15   [provider]  metFORMIN (GLUCOPHAGE) 500 MG tablet Take 500 mg by mouth daily with breakfast.     [provider]  omeprazole (PRILOSEC) 20 MG capsule Take 1 capsule (20 mg total) by mouth daily. 11/17/17   Tegeler, Gwenyth Allegra, MD  senna-docusate (SENOKOT-S) 8.6-50 MG  tablet Take 1 tablet by mouth 2 (two) times daily. While taking strongest pain meds to prevent constipation. 05/24/18   Alexis Frock, MD  Vitamin D, Ergocalciferol, (DRISDOL) 50000 units CAPS capsule TK ONE C PO ONCE A WK 08/06/17   [provider]    Allergies    Patient has no known allergies.  Review of Systems   Review of Systems  Gastrointestinal: Positive for abdominal pain, nausea and vomiting.  Genitourinary: Positive for flank pain.  All other systems reviewed and are negative.   Physical Exam Updated Vital Signs BP (!) 155/72 (BP Location: Right Arm)   Pulse 74   Temp 98 F (36.7 C) (Oral)   Resp 18   Ht 5\' 4"  (1.626 m)   Wt 117.9 kg   SpO2 100%   BMI 44.63 kg/m   Physical Exam Vitals and nursing note reviewed.  Constitutional:      General: She is not in acute distress.    Appearance: Normal appearance. She is well-developed.  HENT:     Head: Normocephalic and atraumatic.     Right Ear: Hearing normal.     Left Ear: Hearing normal.     Nose: Nose normal.  Eyes:     Conjunctiva/sclera: Conjunctivae normal.     Pupils: Pupils are equal, round, and reactive to light.  Cardiovascular:     Rate and Rhythm: Regular rhythm.     Heart sounds: S1 normal and S2 normal. No murmur. No friction rub. No gallop.   Pulmonary:     Effort: Pulmonary effort is normal. No respiratory distress.     Breath sounds: Normal breath sounds.  Chest:     Chest wall: No tenderness.  Abdominal:     General: Bowel sounds are normal.     Palpations: Abdomen is soft.     Tenderness: There is no abdominal tenderness. There is no guarding or rebound. Negative signs include Murphy's sign and McBurney's sign.     Hernia: No hernia is present.  Musculoskeletal:        General: Normal range of motion.     Cervical back: Normal range of motion and neck supple.  Skin:    General: Skin is warm and dry.     Findings: No rash.  Neurological:     Mental Status: She is alert and  oriented to person, place, and time.     GCS: GCS eye subscore is 4. GCS verbal subscore is 5. GCS motor subscore is 6.     Cranial Nerves: No cranial nerve deficit.     Sensory: No sensory deficit.     Coordination: Coordination normal.  Psychiatric:        Speech: Speech normal.        Behavior: Behavior normal.        Thought Content:  Thought content normal.     ED Results / Procedures / Treatments   Labs (all labs ordered are listed, but only abnormal results are displayed) Labs Reviewed  URINALYSIS, ROUTINE W REFLEX MICROSCOPIC - Abnormal; Notable for the following components:      Result Value   Specific Gravity, Urine >1.030 (*)    Hgb urine dipstick LARGE (*)    Leukocytes,Ua TRACE (*)    All other components within normal limits  CBC WITH DIFFERENTIAL/PLATELET - Abnormal; Notable for the following components:   WBC 11.1 (*)    RBC 3.82 (*)    Hemoglobin 10.4 (*)    HCT 33.6 (*)    Neutro Abs 9.6 (*)    All other components within normal limits  COMPREHENSIVE METABOLIC PANEL - Abnormal; Notable for the following components:   Glucose, Bld 169 (*)    BUN 27 (*)    Creatinine, Ser 1.57 (*)    GFR calc non Af Amer 33 (*)    GFR calc Af Amer 38 (*)    All other components within normal limits  LIPASE, BLOOD - Abnormal; Notable for the following components:   Lipase 54 (*)    All other components within normal limits  URINALYSIS, MICROSCOPIC (REFLEX) - Abnormal; Notable for the following components:   Bacteria, UA FEW (*)    All other components within normal limits    EKG None  Radiology CT RENAL STONE STUDY  Result Date: 02/22/2020 CLINICAL DATA:  Left flank pain. Gas pain. History of nephrolithiasis. EXAM: CT ABDOMEN AND PELVIS WITHOUT CONTRAST TECHNIQUE: Multidetector CT imaging of the abdomen and pelvis was performed following the standard protocol without IV contrast. COMPARISON:  05/19/2018 CT abdomen/pelvis. FINDINGS: Lower chest: Mild cylindrical  bronchiectasis in the central right lung base, unchanged. No acute abnormality at the lung bases. Hepatobiliary: Suggestion of a slightly irregular liver surface, cannot exclude cirrhosis. No liver masses. Normal gallbladder with no radiopaque cholelithiasis. No biliary ductal dilatation. Pancreas: Normal, with no mass or duct dilation. Spleen: Normal size. No mass. Adrenals/Urinary Tract: Normal adrenals. Nonobstructing 9 mm and 1 mm lower right renal stones. No right hydronephrosis. Normal caliber right ureter with no right ureteral stones. Obstructing clustered 7 mm, 3 mm and 2 mm left mid lumbar ureteral stones with mild to moderate left hydroureteronephrosis and asymmetric left perinephric fat stranding. At least 4 additional nonobstructing stones scattered in the left renal collecting system, largest 6 mm in the lower left kidney. No contour deforming renal masses. No additional left ureteral stones. Normal nondistended bladder. Stomach/Bowel: Normal non-distended stomach. Normal caliber small bowel with no small bowel wall thickening. Normal appendix. Mild sigmoid diverticulosis with no large bowel wall thickening or acute pericolonic fat stranding. Vascular/Lymphatic: Mildly atherosclerotic nonaneurysmal abdominal aorta. No pathologically enlarged lymph nodes in the abdomen or pelvis. Reproductive: Status post hysterectomy, with no abnormal findings at the vaginal cuff. No adnexal mass. Other: No pneumoperitoneum, ascites or focal fluid collection. Diastasis of the ventral abdominal wall. Multiple small fat containing midline ventral supraumbilical abdominal hernias. Musculoskeletal: No aggressive appearing focal osseous lesions. Mild thoracolumbar spondylosis. IMPRESSION: 1. Obstructing clustered 7 mm, 3 mm and 2 mm left mid lumbar ureteral stones with mild to moderate left hydroureteronephrosis. 2. Additional nonobstructing stones in both kidneys. 3. Suggestion of a slightly irregular liver surface, cannot  exclude cirrhosis. Recommend correlation with liver function tests. Consider outpatient hepatic elastography for further liver fibrosis risk stratification, as clinically warranted. 4. Mild sigmoid diverticulosis. 5. Aortic Atherosclerosis (ICD10-I70.0). Electronically  Signed   By: Ilona Sorrel M.D.   On: 02/22/2020 05:47    Procedures Procedures (including critical care time)  Medications Ordered in ED Medications  morphine 2 MG/ML injection 2 mg (has no administration in time range)  morphine 2 MG/ML injection 2 mg (2 mg Intravenous Given 02/22/20 0601)  ondansetron (ZOFRAN) injection 4 mg (4 mg Intravenous Given 02/22/20 0601)    ED Course  I have reviewed the triage vital signs and the nursing notes.  Pertinent labs & imaging results that were available during my care of the patient were reviewed by me and considered in my medical decision making (see chart for details).    MDM Rules/Calculators/A&P                      Patient presents to the emergency department for evaluation of flank pain.  Work-up reveals multiple ureteral stones.  No evidence of infection.  Patient feeling much improvement with small dose of morphine.  She does have a large stone.  She has required laser lithotripsy with similar size stones in the past.  Since she is comfortable, can be discharged with analgesia, Flomax, follow-up with urology.  Present to [facility] emergency department if pain is uncontrolled.  Final Clinical Impression(s) / ED Diagnoses Final diagnoses:  Ureteral colic    Rx / DC Orders ED Discharge Orders    None       Saray Capasso, Gwenyth Allegra, MD 02/22/20 [phone]

## 2020-02-22 NOTE — ED Notes (Signed)
MD with pt  

## 2020-02-22 NOTE — ED Notes (Signed)
Returned from CT.

## 2020-02-23 ENCOUNTER — Other Ambulatory Visit: Payer: Self-pay | Admitting: Urology

## 2020-02-24 NOTE — Patient Instructions (Addendum)
DUE TO COVID-19 ONLY ONE VISITOR IS ALLOWED TO COME WITH YOU AND STAY IN THE WAITING ROOM ONLY DURING PRE OP AND PROCEDURE DAY OF SURGERY. THE 1 VISITOR MAY VISIT WITH YOU AFTER SURGERY IN YOUR PRIVATE ROOM DURING VISITING HOURS ONLY!  YOU NEED TO HAVE A COVID 19 TEST ON__5/5____ @___2 :20___, THIS TEST MUST BE DONE BEFORE SURGERY, COME  [place] , [address].  ([place]) ONCE YOUR COVID TEST IS COMPLETED, PLEASE BEGIN THE QUARANTINE INSTRUCTIONS AS OUTLINED IN YOUR HANDOUT.                Debbie Velasquez    Your procedure is scheduled on: 5/7/@!   Report to [facility] Main  Entrance   Report to admitting at 8:45 AM     Call this number if you have problems the morning of surgery [phone]    Remember: Do not eat food after Midnight.  You may have clear liquid until 7:00 am    CLEAR LIQUID DIET   Foods Allowed                                                                     Foods Excluded  Coffee and tea, regular and decaf                             liquids that you cannot  Plain Jell-O any favor except red or purple                                           see through such as: Fruit ices (not with fruit pulp)                                     milk, soups, orange juice  Iced Popsicles                                    All solid food Carbonated beverages, regular and diet                                    Cranberry, grape and apple juices Sports drinks like Gatorade Lightly seasoned clear broth or consume(fat free) Sugar, honey syrup      BRUSH YOUR TEETH MORNING OF SURGERY AND RINSE YOUR MOUTH OUT, NO CHEWING GUM CANDY OR MINTS.     Take these medicines the morning of surgery with A SIP OF WATER: Tamsulosin  DO NOT TAKE ANY DIABETIC MEDICATIONS DAY OF YOUR SURGERY  . Do not take oral diabetes medicines (pills) the morning of surgery.  You may not have any metal on your body including hair pins and               piercings  Do not wear jewelry, make-up, lotions, powders or perfumes, deodorant  Do not wear nail polish on your fingernails.  Do not shave  48 hours prior to surgery.              Do not bring valuables to the hospital. [place].  Contacts, dentures or bridgework may not be worn into surgery.      Patients discharged the day of surgery will not be allowed to drive home.   IF YOU ARE HAVING SURGERY AND GOING HOME THE SAME DAY, YOU MUST HAVE AN ADULT TO DRIVE YOU HOME AND BE WITH YOU FOR 24 HOURS.   YOU MAY GO HOME BY TAXI OR UBER OR ORTHERWISE, BUT AN ADULT MUST ACCOMPANY YOU HOME AND STAY WITH YOU FOR 24 HOURS.  Name and phone number of your driver:  Special Instructions: N/A              Please read over the following fact sheets you were given: _____________________________________________________________________             [facility] - Preparing for Surgery Before surgery, you can play an important role.   Because skin is not sterile, your skin needs to be as free of germs as possible.   You can reduce the number of germs on your skin by washing with CHG (chlorahexidine gluconate) soap before surgery.   CHG is an antiseptic cleaner which kills germs and bonds with the skin to continue killing germs even after washing. Please DO NOT use if you have an allergy to CHG or antibacterial soaps.   If your skin becomes reddened/irritated stop using the CHG and inform your nurse when you arrive at Short Stay. Do not shave (including legs and underarms) for at least 48 hours prior to the first CHG shower.   . Please follow these instructions carefully:  1.  Shower with CHG Soap the night before surgery and the  morning of Surgery.  2.  If you choose to wash your hair, wash your hair first as usual with your  normal  shampoo.  3.  After you shampoo, rinse your hair and body thoroughly to remove the  shampoo.                                         4.  Use CHG as you would any other liquid soap.  You can apply chg directly  to the skin and wash                       Gently with a scrungie or clean washcloth.  5.  Apply the CHG Soap to your body ONLY FROM THE NECK DOWN.   Do not use on face/ open                           Wound or open sores. Avoid contact with eyes, ears mouth and genitals (private parts).                       Wash face,  Genitals (private parts) with your normal soap.             6.  Wash thoroughly, paying special attention to the area where your surgery  will be performed.  7.  Thoroughly rinse your body with warm water from the neck down.  8.  DO NOT shower/wash with your normal soap after using and rinsing off  the CHG Soap.             9.  Pat yourself dry with a clean towel.            10.  Wear clean pajamas.            11.  Place clean sheets on your bed the night of your first shower and do not  sleep with pets. Day of Surgery : Do not apply any lotions/deodorants the morning of surgery.  Please wear clean clothes to the hospital/surgery center.  FAILURE TO FOLLOW THESE INSTRUCTIONS MAY RESULT IN THE CANCELLATION OF YOUR SURGERY PATIENT SIGNATURE_________________________________  NURSE SIGNATURE__________________________________  ________________________________________________________________________

## 2020-02-25 ENCOUNTER — Encounter (HOSPITAL_COMMUNITY)
Admission: RE | Admit: 2020-02-25 | Discharge: 2020-02-25 | Disposition: A | Discharge status: Home / Self Care | Payer: Medicare PPO | Source: Ambulatory Visit | Attending: Urology | Admitting: Urology

## 2020-02-25 ENCOUNTER — Other Ambulatory Visit: Payer: Self-pay

## 2020-02-25 ENCOUNTER — Other Ambulatory Visit (HOSPITAL_COMMUNITY)
Admission: RE | Admit: 2020-02-25 | Discharge: 2020-02-25 | Disposition: A | Discharge status: Home / Self Care | Payer: Medicare PPO | Source: Ambulatory Visit | Attending: Urology | Admitting: Urology

## 2020-02-25 ENCOUNTER — Encounter (HOSPITAL_COMMUNITY): Payer: Self-pay

## 2020-02-25 DIAGNOSIS — Z01812 Encounter for preprocedural laboratory examination: Secondary | ICD-10-CM | POA: Insufficient documentation

## 2020-02-25 DIAGNOSIS — Z20822 Contact with and (suspected) exposure to covid-19: Secondary | ICD-10-CM | POA: Diagnosis not present

## 2020-02-25 LAB — BASIC METABOLIC PANEL
Anion gap: 11 (ref 5–15)
BUN: 38 mg/dL — ABNORMAL HIGH (ref 8–23)
CO2: 23 mmol/L (ref 22–32)
Calcium: 9.2 mg/dL (ref 8.9–10.3)
Chloride: 108 mmol/L (ref 98–111)
Creatinine, Ser: 2.06 mg/dL — ABNORMAL HIGH (ref 0.44–1.00)
GFR calc Af Amer: 28 mL/min — ABNORMAL LOW (ref >60)
GFR calc non Af Amer: 24 mL/min — ABNORMAL LOW (ref >60)
Glucose, Bld: 83 mg/dL (ref 70–99)
Potassium: 4.5 mmol/L (ref 3.5–5.1)
Sodium: 142 mmol/L (ref 135–145)

## 2020-02-25 LAB — CBC
HCT: 32.4 % — ABNORMAL LOW (ref 36.0–46.0)
Hemoglobin: 9.7 g/dL — ABNORMAL LOW (ref 12.0–15.0)
MCH: 27.2 pg (ref 26.0–34.0)
MCHC: 29.9 g/dL — ABNORMAL LOW (ref 30.0–36.0)
MCV: 90.8 fL (ref 80.0–100.0)
Platelets: 351 10*3/uL (ref 150–400)
RBC: 3.57 MIL/uL — ABNORMAL LOW (ref 3.87–5.11)
RDW: 14 % (ref 11.5–15.5)
WBC: 10.7 10*3/uL — ABNORMAL HIGH (ref 4.0–10.5)
nRBC: 0 % (ref 0.0–0.2)

## 2020-02-25 LAB — GLUCOSE, CAPILLARY: Glucose-Capillary: 78 mg/dL (ref 70–99)

## 2020-02-25 LAB — HEMOGLOBIN A1C
Hgb A1c MFr Bld: 6.2 % — ABNORMAL HIGH (ref 4.8–5.6)
Mean Plasma Glucose: 131.24 mg/dL

## 2020-02-25 LAB — SARS CORONAVIRUS 2 (TAT 6-24 HRS): SARS Coronavirus 2: NEGATIVE

## 2020-02-25 NOTE — Progress Notes (Signed)
PCP - Dr. Revonda Humphrey Cardiologist - no  Chest x-ray - no EKG - 10/21/19 Stress Test - no ECHO - no Cardiac Cath -no   Sleep Study - no CPAP -   Fasting Blood Sugar - Pt never tests Checks Blood Sugar _____ times a day  Blood Thinner Instructions:ASA Aspirin Instructions:Dr. Tresa Moore said she can continue to take it Last Dose:  Anesthesia review:   Patient denies shortness of breath, fever, cough and chest pain at PAT appointment  yes Patient verbalized understanding of instructions that were given to them at the PAT appointment. Patient was also instructed that they will need to review over the PAT instructions again at home before surgery. yes

## 2020-02-26 MED ORDER — GENTAMICIN SULFATE 40 MG/ML IJ SOLN
5.0000 mg/kg | INTRAVENOUS | Status: AC
  Administered 2020-02-27: 400 mg via INTRAVENOUS
  Filled 2020-02-26: qty 10

## 2020-02-27 ENCOUNTER — Ambulatory Visit (HOSPITAL_COMMUNITY): Payer: Medicare PPO

## 2020-02-27 ENCOUNTER — Encounter (HOSPITAL_COMMUNITY): Payer: Self-pay | Admitting: Urology

## 2020-02-27 ENCOUNTER — Ambulatory Visit (HOSPITAL_COMMUNITY)
Admission: RE | Admit: 2020-02-27 | Discharge: 2020-02-27 | Disposition: A | Discharge status: Home / Self Care | Payer: Medicare PPO | Attending: Urology | Admitting: Urology

## 2020-02-27 ENCOUNTER — Ambulatory Visit (HOSPITAL_COMMUNITY): Payer: Medicare PPO | Admitting: Physician Assistant

## 2020-02-27 ENCOUNTER — Encounter (HOSPITAL_COMMUNITY)
Admission: RE | Disposition: A | Discharge status: Home / Self Care | Payer: Self-pay | Source: Home / Self Care | Attending: Urology

## 2020-02-27 DIAGNOSIS — Z96652 Presence of left artificial knee joint: Secondary | ICD-10-CM | POA: Insufficient documentation

## 2020-02-27 DIAGNOSIS — Z6841 Body Mass Index (BMI) 40.0 and over, adult: Secondary | ICD-10-CM | POA: Diagnosis not present

## 2020-02-27 DIAGNOSIS — K219 Gastro-esophageal reflux disease without esophagitis: Secondary | ICD-10-CM | POA: Diagnosis not present

## 2020-02-27 DIAGNOSIS — Z87442 Personal history of urinary calculi: Secondary | ICD-10-CM | POA: Diagnosis not present

## 2020-02-27 DIAGNOSIS — N202 Calculus of kidney with calculus of ureter: Secondary | ICD-10-CM | POA: Insufficient documentation

## 2020-02-27 DIAGNOSIS — Z7984 Long term (current) use of oral hypoglycemic drugs: Secondary | ICD-10-CM | POA: Diagnosis not present

## 2020-02-27 DIAGNOSIS — I2510 Atherosclerotic heart disease of native coronary artery without angina pectoris: Secondary | ICD-10-CM | POA: Diagnosis not present

## 2020-02-27 DIAGNOSIS — Z905 Acquired absence of kidney: Secondary | ICD-10-CM | POA: Diagnosis not present

## 2020-02-27 DIAGNOSIS — E119 Type 2 diabetes mellitus without complications: Secondary | ICD-10-CM | POA: Diagnosis not present

## 2020-02-27 DIAGNOSIS — Z85528 Personal history of other malignant neoplasm of kidney: Secondary | ICD-10-CM | POA: Insufficient documentation

## 2020-02-27 DIAGNOSIS — I10 Essential (primary) hypertension: Secondary | ICD-10-CM | POA: Diagnosis not present

## 2020-02-27 HISTORY — PX: CYSTOSCOPY WITH RETROGRADE PYELOGRAM, URETEROSCOPY AND STENT PLACEMENT: SHX5789

## 2020-02-27 HISTORY — PX: HOLMIUM LASER APPLICATION: SHX5852

## 2020-02-27 LAB — GLUCOSE, CAPILLARY
Glucose-Capillary: 91 mg/dL (ref 70–99)
Glucose-Capillary: 88 mg/dL (ref 70–99)

## 2020-02-27 SURGERY — CYSTOURETEROSCOPY, WITH RETROGRADE PYELOGRAM AND STENT INSERTION
Anesthesia: General | Site: Ureter | Laterality: Left

## 2020-02-27 MED ORDER — KETOROLAC TROMETHAMINE 10 MG PO TABS: 10.0000 mg | ORAL_TABLET | Freq: Three times a day (TID) | ORAL | 0 refills | Status: DC | PRN

## 2020-02-27 MED ORDER — ACETAMINOPHEN 160 MG/5ML PO SOLN: 325.0000 mg | ORAL | Status: DC | PRN

## 2020-02-27 MED ORDER — LIDOCAINE 2% (20 MG/ML) 5 ML SYRINGE
INTRAMUSCULAR | Status: DC | PRN
  Administered 2020-02-27: 100 mg via INTRAVENOUS

## 2020-02-27 MED ORDER — SODIUM CHLORIDE 0.9 % IR SOLN
Status: DC | PRN
  Administered 2020-02-27: 3000 mL

## 2020-02-27 MED ORDER — FENTANYL CITRATE (PF) 100 MCG/2ML IJ SOLN: 25.0000 ug | INTRAMUSCULAR | Status: DC | PRN

## 2020-02-27 MED ORDER — MIDAZOLAM HCL 2 MG/2ML IJ SOLN
INTRAMUSCULAR | Status: AC
  Filled 2020-02-27: qty 2

## 2020-02-27 MED ORDER — DEXAMETHASONE SODIUM PHOSPHATE 4 MG/ML IJ SOLN
INTRAMUSCULAR | Status: DC | PRN
  Administered 2020-02-27: 4 mg via INTRAVENOUS

## 2020-02-27 MED ORDER — LIDOCAINE 2% (20 MG/ML) 5 ML SYRINGE
INTRAMUSCULAR | Status: AC
  Filled 2020-02-27: qty 5

## 2020-02-27 MED ORDER — IOHEXOL 300 MG/ML  SOLN
INTRAMUSCULAR | Status: DC | PRN
  Administered 2020-02-27: 12:00:00 20 mL via URETHRAL

## 2020-02-27 MED ORDER — ONDANSETRON HCL 4 MG/2ML IJ SOLN: 4.0000 mg | Freq: Once | INTRAMUSCULAR | Status: DC | PRN

## 2020-02-27 MED ORDER — OXYCODONE-ACETAMINOPHEN 5-325 MG PO TABS: 0.5000 | ORAL_TABLET | Freq: Four times a day (QID) | ORAL | 0 refills | Status: DC | PRN

## 2020-02-27 MED ORDER — FENTANYL CITRATE (PF) 100 MCG/2ML IJ SOLN
INTRAMUSCULAR | Status: AC
  Filled 2020-02-27: qty 2

## 2020-02-27 MED ORDER — ACETAMINOPHEN 325 MG PO TABS: 325.0000 mg | ORAL_TABLET | ORAL | Status: DC | PRN

## 2020-02-27 MED ORDER — ONDANSETRON HCL 4 MG/2ML IJ SOLN
INTRAMUSCULAR | Status: DC | PRN
  Administered 2020-02-27: 4 mg via INTRAVENOUS

## 2020-02-27 MED ORDER — OXYCODONE HCL 5 MG PO TABS: 5.0000 mg | ORAL_TABLET | Freq: Once | ORAL | Status: AC | PRN

## 2020-02-27 MED ORDER — PROPOFOL 10 MG/ML IV BOLUS
INTRAVENOUS | Status: AC
  Filled 2020-02-27: qty 20

## 2020-02-27 MED ORDER — FENTANYL CITRATE (PF) 100 MCG/2ML IJ SOLN
INTRAMUSCULAR | Status: DC | PRN
  Administered 2020-02-27 (×2): 25 ug via INTRAVENOUS

## 2020-02-27 MED ORDER — CEPHALEXIN 500 MG PO CAPS: 500.0000 mg | ORAL_CAPSULE | Freq: Two times a day (BID) | ORAL | 0 refills | Status: DC

## 2020-02-27 MED ORDER — OXYCODONE HCL 5 MG/5ML PO SOLN: 5.0000 mg | Freq: Once | ORAL | Status: AC | PRN

## 2020-02-27 MED ORDER — DEXAMETHASONE SODIUM PHOSPHATE 10 MG/ML IJ SOLN
INTRAMUSCULAR | Status: AC
  Filled 2020-02-27: qty 1

## 2020-02-27 MED ORDER — MEPERIDINE HCL 50 MG/ML IJ SOLN: 6.2500 mg | INTRAMUSCULAR | Status: DC | PRN

## 2020-02-27 MED ORDER — ONDANSETRON HCL 4 MG/2ML IJ SOLN
INTRAMUSCULAR | Status: AC
  Filled 2020-02-27: qty 2

## 2020-02-27 MED ORDER — PROPOFOL 10 MG/ML IV BOLUS
INTRAVENOUS | Status: DC | PRN
  Administered 2020-02-27: 200 mg via INTRAVENOUS

## 2020-02-27 MED ORDER — LACTATED RINGERS IV SOLN: INTRAVENOUS | Status: DC

## 2020-02-27 MED ORDER — OXYCODONE HCL 5 MG PO TABS
ORAL_TABLET | ORAL | Status: AC
  Administered 2020-02-27: 5 mg via ORAL
  Filled 2020-02-27: qty 1

## 2020-02-27 SURGICAL SUPPLY — 24 items
BAG URO CATCHER STRL LF (MISCELLANEOUS) ×3 IMPLANT
BASKET LASER NITINOL 1.9FR (BASKET) ×2 IMPLANT
BSKT STON RTRVL 120 1.9FR (BASKET) ×1
CATH INTERMIT  6FR 70CM (CATHETERS) ×3 IMPLANT
CLOTH BEACON ORANGE TIMEOUT ST (SAFETY) ×3 IMPLANT
EXTRACTOR STONE 1.7FRX115CM (UROLOGICAL SUPPLIES) IMPLANT
FIBER LASER FLEXIVA 1000 (UROLOGICAL SUPPLIES) IMPLANT
FIBER LASER FLEXIVA 365 (UROLOGICAL SUPPLIES) IMPLANT
FIBER LASER FLEXIVA 550 (UROLOGICAL SUPPLIES) IMPLANT
FIBER LASER TRAC TIP (UROLOGICAL SUPPLIES) ×2 IMPLANT
GLOVE BIOGEL M STRL SZ7.5 (GLOVE) ×3 IMPLANT
GOWN STRL REUS W/TWL LRG LVL3 (GOWN DISPOSABLE) ×3 IMPLANT
GUIDEWIRE ANG ZIPWIRE 038X150 (WIRE) ×3 IMPLANT
GUIDEWIRE STR DUAL SENSOR (WIRE) ×3 IMPLANT
KIT TURNOVER KIT A (KITS) IMPLANT
MANIFOLD NEPTUNE II (INSTRUMENTS) ×3 IMPLANT
SHEATH URETERAL 12FRX28CM (UROLOGICAL SUPPLIES) ×2 IMPLANT
SHEATH URETERAL 12FRX35CM (MISCELLANEOUS) IMPLANT
STENT POLARIS 5FRX24 (STENTS) ×2 IMPLANT
TRAY CYSTO PACK (CUSTOM PROCEDURE TRAY) ×3 IMPLANT
TUBE FEEDING 8FR 16IN STR KANG (MISCELLANEOUS) ×3 IMPLANT
TUBING CONNECTING 10 (TUBING) ×2 IMPLANT
TUBING CONNECTING 10' (TUBING) ×1
TUBING UROLOGY SET (TUBING) ×3 IMPLANT

## 2020-02-27 NOTE — Anesthesia Preprocedure Evaluation (Signed)
Anesthesia Evaluation  Patient identified by MRN, date of birth, ID band Patient awake    Reviewed: Allergy & Precautions, NPO status , Patient's Chart, lab work & pertinent test results  Airway Mallampati: III  TM Distance: >3 FB Neck ROM: Full    Dental  (+) Upper Dentures   Pulmonary neg pulmonary ROS,    Pulmonary exam normal breath sounds clear to auscultation       Cardiovascular hypertension, Pt. on medications + CAD  Normal cardiovascular exam Rhythm:Regular Rate:Normal  ECG: SR, rate 90   Neuro/Psych negative neurological ROS  negative psych ROS   GI/Hepatic Neg liver ROS, GERD  Medicated and Controlled,N/V   Endo/Other  diabetes, Oral Hypoglycemic AgentsMorbid obesity  Renal/GU negative Renal ROS     Musculoskeletal  (+) Arthritis , Osteoarthritis,    Abdominal (+) + obese,   Peds  Hematology  (+) Blood dyscrasia, anemia ,   Anesthesia Other Findings   Reproductive/Obstetrics                             Anesthesia Physical  Anesthesia Plan  ASA: III  Anesthesia Plan: General   Post-op Pain Management:    Induction: Intravenous and Rapid sequence  PONV Risk Score and Plan: 4 or greater and Midazolam, Dexamethasone, Ondansetron and Treatment may vary due to age or medical condition  Airway Management Planned: LMA  Additional Equipment:   Intra-op Plan:   Post-operative Plan: Extubation in OR  Informed Consent: I have reviewed the patients History and Physical, chart, labs and discussed the procedure including the risks, benefits and alternatives for the proposed anesthesia with the patient or authorized representative who has indicated his/her understanding and acceptance.     Dental advisory given  Plan Discussed with: CRNA, Anesthesiologist and Surgeon  Anesthesia Plan Comments:         Anesthesia Quick Evaluation

## 2020-02-27 NOTE — H&P (Signed)
Debbie Velasquez is an 69 y.o. female.    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 Pre-op staging CT and MRI w/o distant disease.    2 - Solid RIGHT Renal Mass - Rt 2.7cm posterior medial mass with avid enhancement on CT 06/2016. Mass abuts posterior edge of renal vein. 2 artery (lower accessory) / 2 vein right renovascular anatomy.   Recent Surveillance:  06/2019 - CT stable Rt mass, Lt no recurrence   3 - Recurrent Nephrolithiasis -  2016 - medical passage UVJ stone  05/2018 - left ureteroscopy to stone free for 1cm UPJ stone.  02/2020 - Left 51mm ureteral and scattered left renal stones.   PMH sig for morbid obesity, DM2 (no neuropathy), benign hyst, Rt hip and bilateral knee OA, Left knee replacement. Her PCP is Debbie Agar MD. Her daughter Debbie Velasquez ([place]) is also very involved in her care. She is avid [employer] fan.   Today "Debbie Velasquez " is seen to proceed with LEFT ureteroscopy for recurrent LEFT ureteral / renal stoens. Cr 2.0 (up from 1). UCX negative. C19 screen negative. K normal.   Past Medical History:  Diagnosis Date  . Arthritis   . Cancer ([place])    Left renal mass  . GERD (gastroesophageal reflux disease)   . History of kidney stones    x3 -remains with one on right.   Marland Kitchen Hx of seasonal allergies    rare flare ups  . Hypertension   . Left renal mass   . Obesity   . Pneumonia    hx of 20 years ago  . Right ureteral stone   . Type 2 diabetes mellitus (HCC)    Type 2  . Wears glasses   . Wears partial dentures    upper    Past Surgical History:  Procedure Laterality Date  . CESAREAN SECTION  yrs ago  . CYSTOSCOPY WITH RETROGRADE PYELOGRAM, URETEROSCOPY AND STENT PLACEMENT Left 05/24/2018   Procedure: CYSTOSCOPY WITH RETROGRADE PYELOGRAM, URETEROSCOPY AND STENT PLACEMENT;  Surgeon: Debbie Frock, MD;  Location: [facility];  Service: Urology;  Laterality: Left;  . HOLMIUM LASER APPLICATION Left [id number]   Procedure: HOLMIUM LASER APPLICATION;  Surgeon: Debbie Frock, MD;  Location: [facility];  Service: Urology;  Laterality: Left;  . ROBOTIC ASSITED PARTIAL NEPHRECTOMY Left 12/22/2015   Procedure: XI ROBOTIC ASSITED PARTIAL NEPHRECTOMY;  Surgeon: Debbie Frock, MD;  Location: [facility];  Service: Urology;  Laterality: Left;  . TOTAL ABDOMINAL HYSTERECTOMY W/ BILATERAL SALPINGOOPHORECTOMY  1980's  . TOTAL KNEE ARTHROPLASTY Left 01/12/2017   Procedure: LEFT TOTAL KNEE ARTHROPLASTY and Right knee steroid injection;  Surgeon: Mcarthur Rossetti, MD;  Location: [facility];  Service: Orthopedics;  Laterality: Left;    Family History  Problem Relation Age of Onset  . Esophageal cancer Maternal Aunt   . Colon cancer Neg Hx   . Stomach cancer Neg Hx   . Rectal cancer Neg Hx    Social History:  reports that she has never smoked. She has never used smokeless tobacco. She reports that she does not drink alcohol or use drugs.  Allergies: No Known Allergies  No medications prior to admission.    Results for orders placed or performed during the hospital encounter of 02/25/20 (from the past 48 hour(s))  Basic metabolic panel     Status:  Abnormal   Collection Time: 02/25/20  3:10 PM  Result Value Ref Range   Sodium 142 135 - 145 mmol/L   Potassium 4.5 3.5 - 5.1 mmol/L   Chloride 108 98 - 111 mmol/L   CO2 23 22 - 32 mmol/L   Glucose, Bld 83 70 - 99 mg/dL    Comment: Glucose reference range applies only to samples taken after fasting for at least 8 hours.   BUN 38 (H) 8 - 23 mg/dL   Creatinine, Ser 2.06 (H) 0.44 - 1.00 mg/dL   Calcium 9.2 8.9 - 10.3 mg/dL   GFR calc non Af Amer 24 (L) >60 mL/min   GFR calc Af Amer 28 (L) >60 mL/min   Anion gap 11 5 - 15    Comment: Performed at [facility], [place] [address], [place], [place] [address]  CBC     Status: Abnormal    Collection Time: 02/25/20  3:10 PM  Result Value Ref Range   WBC 10.7 (H) 4.0 - 10.5 K/uL   RBC 3.57 (L) 3.87 - 5.11 MIL/uL   Hemoglobin 9.7 (L) 12.0 - 15.0 g/dL   HCT 32.4 (L) 36.0 - 46.0 %   MCV 90.8 80.0 - 100.0 fL   MCH 27.2 26.0 - 34.0 pg   MCHC 29.9 (L) 30.0 - 36.0 g/dL   RDW 14.0 11.5 - 15.5 %   Platelets 351 150 - 400 K/uL   nRBC 0.0 0.0 - 0.2 %    Comment: Performed at [facility], [place] [address], [place], [place] [address]  Hemoglobin A1c     Status: Abnormal   Collection Time: 02/25/20  3:10 PM  Result Value Ref Range   Hgb A1c MFr Bld 6.2 (H) 4.8 - 5.6 %    Comment: (NOTE) Pre diabetes:          5.7%-6.4% Diabetes:              >6.4% Glycemic control for   <7.0% adults with diabetes    Mean Plasma Glucose 131.24 mg/dL    Comment: Performed at [place] [address], [place], [place] [address]  Glucose, capillary     Status: None   Collection Time: 02/25/20  3:13 PM  Result Value Ref Range   Glucose-Capillary 78 70 - 99 mg/dL    Comment: Glucose reference range applies only to samples taken after fasting for at least 8 hours.   No results found.  Review of Systems  Constitutional: Negative for chills and fever.  Genitourinary: Positive for flank pain.  All other systems reviewed and are negative.   There were no vitals taken for this visit. Physical Exam  Constitutional: She appears well-developed.  Very pleasant, at baseline.   HENT:  Head: Normocephalic.  Cardiovascular: Normal rate.  Respiratory: Effort normal.  GI:  Stable large truncal obesity.   Genitourinary:    Genitourinary Comments: Small left CVAT at present.    Musculoskeletal:     Cervical back: Normal range of motion.  Neurological: She is alert.  Skin: Skin is warm.  Psychiatric: She has a normal mood and affect.     Assessment/Plan  Proceed as planned with LEFT ureteroscopy for ureteral / renal tones. Risks, benefits, alternatives, expected peri-op  course discussed previously and reiterated today.   Debbie Frock, MD 02/27/2020, 7:22 AM

## 2020-02-27 NOTE — Anesthesia Postprocedure Evaluation (Signed)
Anesthesia Post Note  Patient: TYEESHA HERIN  Procedure(s) Performed: CYSTOSCOPY WITH RETROGRADE PYELOGRAM, URETEROSCOPY AND STENT PLACEMENT (Left Ureter) HOLMIUM LASER APPLICATION (Left Ureter)     Patient location during evaluation: PACU Anesthesia Type: General Level of consciousness: awake and alert Pain management: pain level controlled Vital Signs Assessment: post-procedure vital signs reviewed and stable Respiratory status: spontaneous breathing, nonlabored ventilation, respiratory function stable and patient connected to nasal cannula oxygen Cardiovascular status: blood pressure returned to baseline and stable Postop Assessment: no apparent nausea or vomiting Anesthetic complications: no    Last Vitals:  Vitals:   02/27/20 1245 02/27/20 1300  BP: (!) 147/66 (!) 165/81  Pulse: 64 (!) 58  Resp: 14 13  Temp:  (!) 36.4 C  SpO2: 98% 99%    Last Pain:  Vitals:   02/27/20 1300  TempSrc:   PainSc: 1                  Chealsea Paske DAVID

## 2020-02-27 NOTE — Discharge Instructions (Signed)
1 - You may have urinary urgency (bladder spasms) and bloody urine on / off with stent in place. This is normal. ° °2 - Call MD or go to ER for fever >102, severe pain / nausea / vomiting not relieved by medications, or acute change in medical status ° °

## 2020-02-27 NOTE — Brief Op Note (Signed)
02/27/2020  12:18 PM  PATIENT:  Debbie Velasquez  69 y.o. female  PRE-OPERATIVE DIAGNOSIS:  LEFT URETERAL STONES  POST-OPERATIVE DIAGNOSIS:  LEFT URETERAL STONES  PROCEDURE:  Procedure(s) with comments: CYSTOSCOPY WITH RETROGRADE PYELOGRAM, URETEROSCOPY AND STENT PLACEMENT (Left) - 1 HR HOLMIUM LASER APPLICATION (Left)  SURGEON:  Surgeon(s) and Role:    Alexis Frock, MD - Primary  PHYSICIAN ASSISTANT:   ASSISTANTS: none   ANESTHESIA:   general  EBL:  minimal   BLOOD ADMINISTERED:none  DRAINS: none   LOCAL MEDICATIONS USED:  NONE  SPECIMEN:  Source of Specimen:  Left renal / ureteral stone fragments  DISPOSITION OF SPECIMEN:  [facility] for compositional analysis  COUNTS:  YES  TOURNIQUET:  * No tourniquets in log *  DICTATION: .Other Dictation: Dictation Number [id number]  PLAN OF CARE: Discharge to home after PACU  PATIENT DISPOSITION:  PACU - hemodynamically stable.   Delay start of Pharmacological VTE agent (>24hrs) due to surgical blood loss or risk of bleeding: yes

## 2020-02-27 NOTE — Transfer of Care (Signed)
Immediate Anesthesia Transfer of Care Note  Patient: Debbie Velasquez  Procedure(s) Performed: CYSTOSCOPY WITH RETROGRADE PYELOGRAM, URETEROSCOPY AND STENT PLACEMENT (Left Ureter) HOLMIUM LASER APPLICATION (Left Ureter)  Patient Location: PACU  Anesthesia Type:General  Level of Consciousness: awake, alert  and oriented  Airway & Oxygen Therapy: Patient Spontanous Breathing and Patient connected to face mask oxygen  Post-op Assessment: Report given to RN and Post -op Vital signs reviewed and stable  Post vital signs: Reviewed and stable  Last Vitals:  Vitals Value Taken Time  BP    Temp    Pulse    Resp    SpO2      Last Pain:  Vitals:   02/27/20 0927  TempSrc:   PainSc: 0-No pain         Complications: No apparent anesthesia complications

## 2020-02-27 NOTE — Anesthesia Procedure Notes (Signed)
Procedure Name: LMA Insertion Date/Time: 02/27/2020 11:34 AM Performed by: Maxwell Caul, CRNA Pre-anesthesia Checklist: Emergency Drugs available, Patient identified, Suction available and Patient being monitored Patient Re-evaluated:Patient Re-evaluated prior to induction Oxygen Delivery Method: Circle system utilized and Simple face mask Preoxygenation: Pre-oxygenation with 100% oxygen Induction Type: IV induction LMA: LMA inserted LMA Size: 4.0 Number of attempts: 1 Tube secured with: Tape

## 2020-02-28 ENCOUNTER — Other Ambulatory Visit (HOSPITAL_COMMUNITY): Payer: Medicare PPO

## 2020-02-28 NOTE — Op Note (Signed)
NAME: Debbie Velasquez, STOECKLEIN MEDICAL RECORD [id number] ACCOUNT [id number] DATE OF BIRTH:1951-06-02 FACILITY: [facility] LOCATION: [facility] PH[facility]ICIAN:Deaire Mcwhirter, MD  OPERATIVE REPORT  DATE OF PROCEDURE:  02/27/2020  PREOPERATIVE DIAGNOSIS:  Left renal and ureteral stones, recurrent.  PROCEDURE: 1.  Cystoscopy, left retrograde pyelogram interpretation. 2.  Left ureteroscopy with laser lithotripsy. 3.  Insertion of left ureteral orifices 5  x 24 Polaris, no tether.  ESTIMATED BLOOD LOSS:  Nil.  COMPLICATIONS:  None.  SPECIMENS:  Left renal and ureteral stone fragments given to the patient.  INDICATIONS:  The patient is a very pleasant 69 year old woman with a remote history of kidney cancer, status post partial nephrectomy.  She has done well from that perspective.  She also has a history of recurrent urolithiasis despite medical therapy,  which has led recurrence rates.  She was found on workup of colicky left flank pain to have recurrent left ureteral stone.  This is quite large at around 8 mm or so with proximal lower pole stones.  Options were discussed for management including  recommended path of elective ureteroscopy, and she wished to proceed.  Informed consent was then placed in medical record.  DESCRIPTION OF PROCEDURE:  The patient was identified, the procedure being left ureteroscopic stimulation was confirmed.  A procedure timeout was performed.  Intravenous antibiotics were administered.  General LMA anesthesia induced.  The patient was  placed into a low lithotomy position, sterile field was created by prepping and draping this vagina, introitus and proximal thighs using iodine.  Cystourethroscopy was then performed using 21-French rigid cystoscope vessel lens.   Inspection of the  urinary bladder revealed no diverticula, calcifications or papillary lesions.  Ureteral orifices were singleton.  The left ureteral orifice was cannulated with a [place] renal catheter and left  retrograde pyelogram was obtained.  Left retrograde pyelogram demonstrated a single left ureter with single system left kidney.  There was a filling defect in the proximal ureter consistent with known stone.  There was moderate hydronephrosis above this.  A 0.03 ZIPwire was advanced to  level of the upper pole and set aside as a safety wire.  An 8-French feeding tube was placed in the urinary bladder for pressure release, and semirigid ureteroscopy performed distal 4/5 left ureter alongside a separate sensor working wire.  At the  uppermost reaches of the ureteroscope an ovoid calcification was encountered.  This was retrograde positioned to the level of the kidney and the semirigid scope was exchanged for a 12/14 short length ureteral access sheath level of the proximal ureter  using continuous fluoroscopic guidance and flexible digital ureteroscopy performed the proximal ureter and systematic inspection of the left kidney using flexible digital ureteroscope.  The patient's kidney revealed retrograde positioning of prior  ureteral stone into the upper pole calix was also a separate foci of lower pole stones and a conglomerate approximately 1 cm total.  The previous ureteral stone was much too large for simple basketing.  As such, holmium laser energy applied 70 setting of  0.2 joules and 20 Hz, and approximately 50% of the stone was dusted 30% fragmented with the fragments being amenable to simple basketing with an escape basket and all fragments larger than one-third mm were removed.  The lower pole focus was then  carefully managed using a sequential basketing all accessible stone fragments larger than one-third mm were removed.  Given the large stone volume, it was felt that interval stenting with a nontethered stent would be warranted.  As such, the access  sheath and under continuous vision, no mucosal abnormalities were found, and a new 5 x 24 Polaris-type stent was placed over the safety wire using  fluoroscopic guidance.  Good proximal coil were noted.  She was terminated.  The patient tolerated the  procedure well.  No immediate complications.  The patient was care in stable condition.  Plan for discharge home.  PN/NUANCE  D:02/27/2020 T:02/28/2020 JOB:[id number]

## 2020-02-29 ENCOUNTER — Encounter: Payer: Self-pay | Admitting: *Deleted

## 2020-06-03 ENCOUNTER — Emergency Department (HOSPITAL_COMMUNITY)
Admission: EM | Admit: 2020-06-03 | Discharge: 2020-06-03 | Disposition: A | Discharge status: Home / Self Care | Payer: Medicare PPO | Attending: Emergency Medicine | Admitting: Emergency Medicine

## 2020-06-03 ENCOUNTER — Encounter (HOSPITAL_COMMUNITY): Payer: Self-pay

## 2020-06-03 ENCOUNTER — Other Ambulatory Visit: Payer: Self-pay

## 2020-06-03 DIAGNOSIS — K219 Gastro-esophageal reflux disease without esophagitis: Secondary | ICD-10-CM | POA: Insufficient documentation

## 2020-06-03 DIAGNOSIS — I10 Essential (primary) hypertension: Secondary | ICD-10-CM | POA: Diagnosis not present

## 2020-06-03 DIAGNOSIS — E119 Type 2 diabetes mellitus without complications: Secondary | ICD-10-CM | POA: Diagnosis not present

## 2020-06-03 DIAGNOSIS — Z85528 Personal history of other malignant neoplasm of kidney: Secondary | ICD-10-CM | POA: Insufficient documentation

## 2020-06-03 DIAGNOSIS — Z7982 Long term (current) use of aspirin: Secondary | ICD-10-CM | POA: Diagnosis not present

## 2020-06-03 DIAGNOSIS — R197 Diarrhea, unspecified: Secondary | ICD-10-CM | POA: Diagnosis not present

## 2020-06-03 DIAGNOSIS — R109 Unspecified abdominal pain: Secondary | ICD-10-CM | POA: Diagnosis present

## 2020-06-03 DIAGNOSIS — Z79899 Other long term (current) drug therapy: Secondary | ICD-10-CM | POA: Diagnosis not present

## 2020-06-03 DIAGNOSIS — Z96652 Presence of left artificial knee joint: Secondary | ICD-10-CM | POA: Insufficient documentation

## 2020-06-03 LAB — URINALYSIS, ROUTINE W REFLEX MICROSCOPIC
Bilirubin Urine: NEGATIVE
Glucose, UA: NEGATIVE mg/dL
Hgb urine dipstick: NEGATIVE
Ketones, ur: NEGATIVE mg/dL
Nitrite: NEGATIVE
Protein, ur: NEGATIVE mg/dL
Specific Gravity, Urine: 1.017 (ref 1.005–1.030)
pH: 5 (ref 5.0–8.0)

## 2020-06-03 LAB — CBC
HCT: 32.9 % — ABNORMAL LOW (ref 36.0–46.0)
Hemoglobin: 10 g/dL — ABNORMAL LOW (ref 12.0–15.0)
MCH: 27.3 pg (ref 26.0–34.0)
MCHC: 30.4 g/dL (ref 30.0–36.0)
MCV: 89.9 fL (ref 80.0–100.0)
Platelets: 339 10*3/uL (ref 150–400)
RBC: 3.66 MIL/uL — ABNORMAL LOW (ref 3.87–5.11)
RDW: 13.8 % (ref 11.5–15.5)
WBC: 10.5 10*3/uL (ref 4.0–10.5)
nRBC: 0 % (ref 0.0–0.2)

## 2020-06-03 LAB — COMPREHENSIVE METABOLIC PANEL
ALT: 14 U/L (ref 0–44)
AST: 17 U/L (ref 15–41)
Albumin: 4 g/dL (ref 3.5–5.0)
Alkaline Phosphatase: 69 U/L (ref 38–126)
Anion gap: 8 (ref 5–15)
BUN: 23 mg/dL (ref 8–23)
CO2: 24 mmol/L (ref 22–32)
Calcium: 9.1 mg/dL (ref 8.9–10.3)
Chloride: 104 mmol/L (ref 98–111)
Creatinine, Ser: 1.08 mg/dL — ABNORMAL HIGH (ref 0.44–1.00)
GFR calc Af Amer: >60 mL/min (ref >60)
GFR calc non Af Amer: 52 mL/min — ABNORMAL LOW (ref >60)
Glucose, Bld: 105 mg/dL — ABNORMAL HIGH (ref 70–99)
Potassium: 3.9 mmol/L (ref 3.5–5.1)
Sodium: 136 mmol/L (ref 135–145)
Total Bilirubin: 0.5 mg/dL (ref 0.3–1.2)
Total Protein: 7.8 g/dL (ref 6.5–8.1)

## 2020-06-03 LAB — POC OCCULT BLOOD, ED: Fecal Occult Bld: NEGATIVE

## 2020-06-03 LAB — LIPASE, BLOOD: Lipase: 65 U/L — ABNORMAL HIGH (ref 11–51)

## 2020-06-03 NOTE — ED Triage Notes (Signed)
Pt presents with c/o abdominal pain and diarrhea starting this morning. Pt reports a speck of blood with diarrhea.

## 2020-06-03 NOTE — ED Notes (Signed)
Pt verbalizes understanding of DC instructions. Pt belongings returned and is ambulatory out of ED.  

## 2020-06-03 NOTE — ED Provider Notes (Signed)
[facility] DEPT Provider Note   CSN: [id number] Arrival date & time: 06/03/20  8676     History Chief Complaint  Patient presents with  . Diarrhea  . Abdominal Pain    Debbie Velasquez is a 69 y.o. female.  Patient had some diarrhea today and noticed a couple of blood specks on the stool and on her tissue paper.  Not having any abdominal pain and diarrhea has resolved.  Mostly concerned about possible blood in her stool.  Denies any rectal pain.  The history is provided by the patient.  Diarrhea Quality:  Watery Severity:  Mild Onset quality:  Sudden Progression:  Resolved Relieved by:  Nothing Worsened by:  Nothing Associated symptoms: abdominal pain   Associated symptoms: no arthralgias, no chills, no fever and no vomiting   Risk factors: no sick contacts   Abdominal Pain Associated symptoms: diarrhea   Associated symptoms: no chest pain, no chills, no cough, no dysuria, no fever, no hematuria, no shortness of breath, no sore throat and no vomiting        Past Medical History:  Diagnosis Date  . Arthritis   . Cancer ([place])    Left renal mass  . GERD (gastroesophageal reflux disease)   . History of kidney stones    x3 -remains with one on right.   Marland Kitchen Hx of seasonal allergies    rare flare ups  . Hypertension   . Left renal mass   . Obesity   . Pneumonia    hx of 20 years ago  . Right ureteral stone   . Type 2 diabetes mellitus ([facility])    Type 2  . Wears glasses   . Wears partial dentures    upper    Patient Active Problem List   Diagnosis Date Noted  . Facet degeneration of lumbar region 12/18/2018  . Chronic low back pain without sciatica 06/18/2017  . Primary osteoarthritis of right knee 03/26/2017  . Presence of left artificial knee joint 02/26/2017  . Unilateral primary osteoarthritis, left knee 01/12/2017  . 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/12/2017  . Renal mass 12/22/2015    Past Surgical History:    Procedure Laterality Date  . CESAREAN SECTION  yrs ago  . CYSTOSCOPY WITH RETROGRADE PYELOGRAM, URETEROSCOPY AND STENT PLACEMENT Left 05/24/2018   Procedure: CYSTOSCOPY WITH RETROGRADE PYELOGRAM, URETEROSCOPY AND STENT PLACEMENT;  Surgeon: Alexis Frock, MD;  Location: [facility];  Service: Urology;  Laterality: Left;  . CYSTOSCOPY WITH RETROGRADE PYELOGRAM, URETEROSCOPY AND STENT PLACEMENT Left 02/27/2020   Procedure: CYSTOSCOPY WITH RETROGRADE PYELOGRAM, URETEROSCOPY AND STENT PLACEMENT;  Surgeon: Alexis Frock, MD;  Location: [facility];  Service: Urology;  Laterality: Left;  1 HR  . HOLMIUM LASER APPLICATION Left 04/23/946   Procedure: HOLMIUM LASER APPLICATION;  Surgeon: Alexis Frock, MD;  Location: [facility];  Service: Urology;  Laterality: Left;  . HOLMIUM LASER APPLICATION Left 0/06/6282   Procedure: HOLMIUM LASER APPLICATION;  Surgeon: Alexis Frock, MD;  Location: [facility];  Service: Urology;  Laterality: Left;  . ROBOTIC ASSITED PARTIAL NEPHRECTOMY Left 12/22/2015   Procedure: XI ROBOTIC ASSITED PARTIAL NEPHRECTOMY;  Surgeon: Alexis Frock, MD;  [facility]: [facility];  Service: Urology;  Laterality: Left;  . TOTAL ABDOMINAL HYSTERECTOMY W/ BILATERAL SALPINGOOPHORECTOMY  1980's  . TOTAL KNEE ARTHROPLASTY Left 01/12/2017   Procedure: LEFT TOTAL KNEE ARTHROPLASTY and Right knee steroid injection;  Surgeon: Mcarthur Rossetti, MD;  Location: [facility];  Service: Orthopedics;  Laterality: Left;  OB History   No obstetric history on file.     Family History  Problem Relation Age of Onset  . Esophageal cancer Maternal Aunt   . Colon cancer Neg Hx   . Stomach cancer Neg Hx   . Rectal cancer Neg Hx     Social History   Tobacco Use  . Smoking status: Never Smoker  . Smokeless tobacco: Never Used  Vaping Use  . Vaping Use: Never used  Substance Use Topics  . Alcohol use: No    Alcohol/week: 0.0 standard drinks  . Drug use: No    Home Medications Prior to Admission medications    Medication Sig Start Date End Date Taking? Authorizing Provider  Alum Hydroxide-Mag Trisilicate (GAVISCON) 69-62.9 MG CHEW Chew 2 tablets by mouth daily as needed (indigestion).   Yes [provider]  aspirin 81 MG chewable tablet Chew 1 tablet (81 mg total) by mouth 2 (two) times daily. Patient taking differently: Chew 81 mg by mouth daily.  01/15/17  Yes Mcarthur Rossetti, MD  losartan-hydrochlorothiazide (HYZAAR) 100-25 MG tablet Take 1 tablet by mouth daily. 03/16/20  Yes [provider]  metFORMIN (GLUCOPHAGE) 500 MG tablet Take 500 mg by mouth daily with breakfast.    Yes [provider]  Multiple Vitamins-Minerals (MULTIVITAMIN WITH MINERALS) tablet Take 2 tablets by mouth daily. Vita fusion Chewable   Yes [provider]  Vitamin D, Ergocalciferol, (DRISDOL) 50000 units CAPS capsule Take 50,000 Units by mouth every 7 (seven) days. Thursday 08/06/17  Yes [provider]  cephALEXin (KEFLEX) 500 MG capsule Take 1 capsule (500 mg total) by mouth 2 (two) times daily. X 3 days. Begin day before next Urology appointment. Patient not taking: Reported on 06/03/2020 02/27/20   Alexis Frock, MD  ketorolac (TORADOL) 10 MG tablet Take 1 tablet (10 mg total) by mouth every 8 (eight) hours as needed. For mild-moderate pain and stent discomfort post-operatively Patient not taking: Reported on 06/03/2020 02/27/20   Alexis Frock, MD  ondansetron (ZOFRAN ODT) 4 MG disintegrating tablet Take 1 tablet (4 mg total) by mouth every 8 (eight) hours as needed. Patient not taking: Reported on 06/03/2020 02/22/20   Isla Pence, MD  oxyCODONE-acetaminophen (PERCOCET) 5-325 MG tablet Take 0.5-1 tablets by mouth every 6 (six) hours as needed for severe pain. Post-operatively. Patient not taking: Reported on 06/03/2020 02/27/20   Alexis Frock, MD  tamsulosin (FLOMAX) 0.4 MG CAPS capsule Take 1 capsule (0.4 mg total) by mouth daily. Patient not taking: Reported on  06/03/2020 02/22/20   Orpah Greek, MD    Allergies    Patient has no known allergies.  Review of Systems   Review of Systems  Constitutional: Negative for chills and fever.  HENT: Negative for ear pain and sore throat.   Eyes: Negative for pain and visual disturbance.  Respiratory: Negative for cough and shortness of breath.   Cardiovascular: Negative for chest pain and palpitations.  Gastrointestinal: Positive for abdominal pain, blood in stool and diarrhea. Negative for vomiting.  Genitourinary: Negative for dysuria and hematuria.  Musculoskeletal: Negative for arthralgias and back pain.  Skin: Negative for color change and rash.  Neurological: Negative for seizures and syncope.  All other systems reviewed and are negative.   Physical Exam Updated Vital Signs  ED Triage Vitals  Enc Vitals Group     BP 06/03/20 0949 (!) 162/87     Pulse Rate 06/03/20 0949 72     Resp 06/03/20 0949 16  Temp 06/03/20 0949 98.3 F (36.8 C)     Temp Source 06/03/20 0949 Oral     SpO2 06/03/20 0949 100 %     Weight 06/03/20 1722 260 lb (117.9 kg)     Height 06/03/20 1722 5\' 4"  (1.626 m)     Head Circumference --      Peak Flow --      Pain Score 06/03/20 0957 0     Pain Loc --      Pain Edu? --      Excl. in [place]? --     Physical Exam Vitals and nursing note reviewed.  Constitutional:      General: She is not in acute distress.    Appearance: She is well-developed. She is not ill-appearing.  HENT:     Head: Normocephalic and atraumatic.     Mouth/Throat:     Mouth: Mucous membranes are moist.     Pharynx: Oropharynx is clear.  Eyes:     Extraocular Movements: Extraocular movements intact.     Conjunctiva/sclera: Conjunctivae normal.     Pupils: Pupils are equal, round, and reactive to light.  Cardiovascular:     Rate and Rhythm: Normal rate and regular rhythm.     Heart sounds: Normal heart sounds. No murmur heard.   Pulmonary:     Effort: Pulmonary effort is normal.  No respiratory distress.     Breath sounds: Normal breath sounds.  Abdominal:     General: Bowel sounds are normal.     Palpations: Abdomen is soft.     Tenderness: There is no abdominal tenderness.  Genitourinary:    Rectum: Guaiac result negative.     Comments: Grossly brown stool on exam with no rectal tenderness, small hemorrhoid Musculoskeletal:     Cervical back: Neck supple.  Skin:    General: Skin is warm and dry.     Capillary Refill: Capillary refill takes less than 2 seconds.  Neurological:     Mental Status: She is alert.     ED Results / Procedures / Treatments   Labs (all labs ordered are listed, but only abnormal results are displayed) Labs Reviewed  LIPASE, BLOOD - Abnormal; Notable for the following components:      Result Value   Lipase 65 (*)    All other components within normal limits  COMPREHENSIVE METABOLIC PANEL - Abnormal; Notable for the following components:   Glucose, Bld 105 (*)    Creatinine, Ser 1.08 (*)    GFR calc non Af Amer 52 (*)    All other components within normal limits  CBC - Abnormal; Notable for the following components:   RBC 3.66 (*)    Hemoglobin 10.0 (*)    HCT 32.9 (*)    All other components within normal limits  URINALYSIS, ROUTINE W REFLEX MICROSCOPIC - Abnormal; Notable for the following components:   APPearance HAZY (*)    Leukocytes,Ua SMALL (*)    Bacteria, UA RARE (*)    All other components within normal limits  URINE CULTURE  POC OCCULT BLOOD, ED    EKG None  Radiology No results found.  Procedures Procedures (including critical care time)  Medications Ordered in ED Medications - No data to display  ED Course  I have reviewed the triage vital signs and the nursing notes.  Pertinent labs & imaging results that were available during my care of the patient were reviewed by me and considered in my medical decision making (see chart for details).  MDM Rules/Calculators/A&P                           BERKLEY WRIGHTSMAN is a 69 year old female with history of hypertension, diabetes, reflux who presents to the ED with diarrhea, blood in stool.  Normal vitals.  No fever.  Not having any abdominal pain, no longer having diarrhea.  Is mostly concerned because she noticed some red blood in the stool and on her toilet paper.  Grossly brown stool on exam.  Hemoccult is negative.  Does have a small hemorrhoid.  Hemoglobin is normal.  No significant anemia or electrolyte abnormality.  Overall suspect hemorrhoidal type bleeding or some irritation.  Given reassurance and discharged from ED in good condition.  No concern for intra-abdominal process.  No abdominal tenderness.  Overall lipase unremarkable as well as hepatobiliary labs.  Urinalysis is equivocal and will send urine culture.  No symptoms, will hold on any treatment.  This chart was dictated using voice recognition software.  Despite best efforts to proofread,  errors can occur which can change the documentation meaning.    Final Clinical Impression(s) / ED Diagnoses Final diagnoses:  Diarrhea, unspecified type    Rx / DC Orders ED Discharge Orders    None       Lennice Sites, DO 06/03/20 1901

## 2020-06-04 LAB — URINE CULTURE: Culture: <10000 — AB

## 2020-06-17 ENCOUNTER — Ambulatory Visit (HOSPITAL_COMMUNITY)
Admission: EM | Admit: 2020-06-17 | Discharge: 2020-06-17 | Disposition: A | Discharge status: Home / Self Care | Payer: Medicare PPO | Attending: Internal Medicine | Admitting: Internal Medicine

## 2020-06-17 ENCOUNTER — Encounter (HOSPITAL_COMMUNITY): Payer: Self-pay | Admitting: Emergency Medicine

## 2020-06-17 ENCOUNTER — Other Ambulatory Visit: Payer: Self-pay

## 2020-06-17 DIAGNOSIS — Z20822 Contact with and (suspected) exposure to covid-19: Secondary | ICD-10-CM | POA: Insufficient documentation

## 2020-06-17 DIAGNOSIS — Z96653 Presence of artificial knee joint, bilateral: Secondary | ICD-10-CM | POA: Diagnosis not present

## 2020-06-17 DIAGNOSIS — I10 Essential (primary) hypertension: Secondary | ICD-10-CM | POA: Diagnosis not present

## 2020-06-17 DIAGNOSIS — Z7984 Long term (current) use of oral hypoglycemic drugs: Secondary | ICD-10-CM | POA: Insufficient documentation

## 2020-06-17 DIAGNOSIS — Z7982 Long term (current) use of aspirin: Secondary | ICD-10-CM | POA: Diagnosis not present

## 2020-06-17 DIAGNOSIS — Z90722 Acquired absence of ovaries, bilateral: Secondary | ICD-10-CM | POA: Insufficient documentation

## 2020-06-17 DIAGNOSIS — Z79899 Other long term (current) drug therapy: Secondary | ICD-10-CM | POA: Diagnosis not present

## 2020-06-17 DIAGNOSIS — R110 Nausea: Secondary | ICD-10-CM | POA: Insufficient documentation

## 2020-06-17 DIAGNOSIS — Z905 Acquired absence of kidney: Secondary | ICD-10-CM | POA: Insufficient documentation

## 2020-06-17 DIAGNOSIS — E119 Type 2 diabetes mellitus without complications: Secondary | ICD-10-CM | POA: Insufficient documentation

## 2020-06-17 DIAGNOSIS — R197 Diarrhea, unspecified: Secondary | ICD-10-CM | POA: Insufficient documentation

## 2020-06-17 DIAGNOSIS — Z8719 Personal history of other diseases of the digestive system: Secondary | ICD-10-CM | POA: Diagnosis not present

## 2020-06-17 LAB — SARS CORONAVIRUS 2 (TAT 6-24 HRS): SARS Coronavirus 2: NEGATIVE

## 2020-06-17 NOTE — ED Provider Notes (Signed)
[facility]    CSN: [id number] Arrival date & time: 06/17/20  6073      History   Chief Complaint Chief Complaint  Patient presents with   Diarrhea   Nausea    HPI Debbie Velasquez is a 69 y.o. female history of DM type II, hypertension, presenting today for evaluation of diarrhea and nausea.  Patient reports that yesterday she began to develop frequent bowel movements, worsened overnight and was helping bowels every 10 minutes.  Went to [facility] and got some [facility]modium which has subsided the diarrhea.  Nausea has also subsided.  Denies vomiting.  Denies abdominal pain.  Denies URI symptoms.  Does report a small amount of blood in the stool, but reports that she was noted to have hemorrhoids approximately 1 week ago.  She denies any dizziness or lightheadedness.  HPI  Past Medical History:  Diagnosis Date   Arthritis    Cancer ([place])    Left renal mass   GERD (gastroesophageal reflux disease)    History of kidney stones    x3 -remains with one on right.    Hx of seasonal allergies    rare flare ups   Hypertension    Left renal mass    Obesity    Pneumonia    hx of 20 years ago   Right ureteral stone    Type 2 diabetes mellitus ([place])    Type 2   Wears glasses    Wears partial dentures    upper    Patient Active Problem List   Diagnosis Date Noted   Facet degeneration of lumbar region 12/18/2018   Chronic low back pain without sciatica 06/18/2017   Primary osteoarthritis of right knee 03/26/2017   Presence of left artificial knee joint 02/26/2017   Unilateral primary osteoarthritis, left knee 01/12/2017   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/12/2017   Renal mass 12/22/2015    Past Surgical History:  Procedure Laterality Date   CESAREAN SECTION  yrs ago   [place], URETEROSCOPY AND STENT PLACEMENT Left 05/24/2018   Procedure: CYSTOSCOPY WITH RETROGRADE PYELOGRAM, URETEROSCOPY AND STENT PLACEMENT;   Surgeon: Alexis Frock, MD;  [facility];  Service: Urology;  Laterality: Left;   CYSTOSCOPY WITH RETROGRADE PYELOGRAM, URETEROSCOPY AND STENT PLACEMENT Left 02/27/2020   Procedure: CYSTOSCOPY WITH RETROGRADE PYELOGRAM, URETEROSCOPY AND STENT PLACEMENT;  Surgeon: Alexis Frock, MD;  [facility];  Service: Urology;  Laterality: Left;  1 HR   HOLMIUM LASER APPLICATION Left 04/22/625   Procedure: HOLMIUM LASER APPLICATION;  Surgeon: Alexis Frock, MD;  [facility];  Service: Urology;  Laterality: Left;   HOLMIUM LASER APPLICATION Left 06/26/8545   Procedure: HOLMIUM LASER APPLICATION;  Surgeon: Alexis Frock, MD;  [facility];  Service: Urology;  Laterality: Left;   ROBOTIC ASSITED PARTIAL NEPHRECTOMY Left 12/22/2015   Procedure: XI ROBOTIC ASSITED PARTIAL NEPHRECTOMY;  Surgeon: Alexis Frock, MD;  [facility];  Service: Urology;  Laterality: Left;   TOTAL ABDOMINAL HYSTERECTOMY W/ BILATERAL SALPINGOOPHORECTOMY  1980's   TOTAL KNEE ARTHROPLASTY Left 01/12/2017   Procedure: LEFT TOTAL KNEE ARTHROPLASTY and Right knee steroid injection;  Surgeon: Mcarthur Rossetti, MD;  [facility];  Service: Orthopedics;  Laterality: Left;    OB History   No obstetric history on file.      Home Medications    Prior to Admission medications   Medication Sig Start Date End Date Taking? Authorizing Provider  Alum Hydroxide-Mag Trisilicate (GAVISCON) 27-03.5 MG  CHEW Chew 2 tablets by mouth daily as needed (indigestion).   Yes [provider]  aspirin 81 MG chewable tablet Chew 1 tablet (81 mg total) by mouth 2 (two) times daily. Patient taking differently: Chew 81 mg by mouth daily.  01/15/17  Yes Mcarthur Rossetti, MD  losartan-hydrochlorothiazide (HYZAAR) 100-25 MG tablet Take 1 tablet by mouth daily. 03/16/20  Yes [provider]  metFORMIN (GLUCOPHAGE) 500 MG tablet Take 500 mg by mouth daily with breakfast.    Yes [provider]    Multiple Vitamins-Minerals (MULTIVITAMIN WITH MINERALS) tablet Take 2 tablets by mouth daily. Vita fusion Chewable   Yes [provider]  Vitamin D, Ergocalciferol, (DRISDOL) 50000 units CAPS capsule Take 50,000 Units by mouth every 7 (seven) days. Thursday 08/06/17  Yes [provider]    Family History Family History  Problem Relation Age of Onset   Esophageal cancer Maternal Aunt    Colon cancer Neg Hx    Stomach cancer Neg Hx    Rectal cancer Neg Hx     Social History Social History   Tobacco Use   Smoking status: Never Smoker   Smokeless tobacco: Never Used  Scientific laboratory technician Use: Never used  Substance Use Topics   Alcohol use: No    Alcohol/week: 0.0 standard drinks   Drug use: No     Allergies   Patient has no known allergies.   Review of Systems Review of Systems  Constitutional: Negative for activity change, appetite change, chills, fatigue and fever.  HENT: Negative for congestion, ear pain, rhinorrhea, sinus pressure, sore throat and trouble swallowing.   Eyes: Negative for discharge and redness.  Respiratory: Negative for cough, chest tightness and shortness of breath.   Cardiovascular: Negative for chest pain.  Gastrointestinal: Positive for diarrhea and nausea. Negative for abdominal pain and vomiting.  Musculoskeletal: Negative for myalgias.  Skin: Negative for rash.  Neurological: Negative for dizziness, light-headedness and headaches.     Physical Exam Triage Vital Signs ED Triage Vitals  Enc Vitals Group     BP      Pulse      Resp      Temp      Temp src      SpO2      Weight      Height      Head Circumference      Peak Flow      Pain Score      Pain Loc      Pain Edu?      Excl. in [place]?    No data found.  Updated Vital Signs BP (!) 153/74 (BP Location: Left Arm)    Pulse 64    Temp (!) 97.5 F (36.4 C) (Oral)    Resp 16    SpO2 100%   Visual Acuity Right Eye Distance:   Left Eye Distance:    Bilateral Distance:    Right Eye Near:   Left Eye Near:    Bilateral Near:     Physical Exam Vitals and nursing note reviewed.  Constitutional:      Appearance: She is well-developed.     Comments: No acute distress  HENT:     Head: Normocephalic and atraumatic.     Ears:     Comments: Bilateral ears without tenderness to palpation of external auricle, tragus and mastoid, EAC's without erythema or swelling, TM's with good bony landmarks and cone of light. Non erythematous.  Nose: Nose normal.     Mouth/Throat:     Comments: Oral mucosa pink and moist, no tonsillar enlargement or exudate. Posterior pharynx patent and nonerythematous, no uvula deviation or swelling. Normal phonation. Eyes:     Conjunctiva/sclera: Conjunctivae normal.  Cardiovascular:     Rate and Rhythm: Normal rate.  Pulmonary:     Effort: Pulmonary effort is normal. No respiratory distress.     Comments: Breathing comfortably at rest, CTABL, no wheezing, rales or other adventitious sounds auscultated Abdominal:     General: There is no distension.     Comments: Soft, nondistended, nontender to deep palpation throughout entire abdomen  Musculoskeletal:        General: Normal range of motion.     Cervical back: Neck supple.  Skin:    General: Skin is warm and dry.  Neurological:     Mental Status: She is alert and oriented to person, place, and time.      UC Treatments / Results  Labs (all labs ordered are listed, but only abnormal results are displayed) Labs Reviewed  SARS CORONAVIRUS 2 (TAT 6-24 HRS)    EKG   Radiology No results found.  Procedures Procedures (including critical care time)  Medications Ordered in UC Medications - No data to display  Initial Impression / Assessment and Plan / UC Course  I have reviewed the triage vital signs and the nursing notes.  Pertinent labs & imaging results that were available during my care of the patient were reviewed by me and considered in  my medical decision making (see chart for details).     Covid test pending.  GI symptoms without abdominal pain, x1 day, suspect likely viral etiology and recommending symptomatic and supportive care with close monitoring.  Push fluids.  Discussed strict return precautions. Patient verbalized understanding and is agreeable with plan.  Final Clinical Impressions(s) / UC Diagnoses   Final diagnoses:  Diarrhea, unspecified type  Nausea     Discharge Instructions     COVID test pending May use nausea medicine at home as needed Continue imodium as needed Focus on fluids Follow up if not resolving, worsening, developing abdominal pain, dizziness or lightheadedness    ED Prescriptions    None     PDMP not reviewed this encounter.   Janith Lima, PA-C 06/17/20 1234

## 2020-06-17 NOTE — Discharge Instructions (Signed)
COVID test pending May use nausea medicine at home as needed Continue imodium as needed Focus on fluids Follow up if not resolving, worsening, developing abdominal pain, dizziness or lightheadedness

## 2020-06-17 NOTE — ED Triage Notes (Signed)
Pt c/o nausea and diarrhea onset yesterday. She states she went to [facility] around [facility] am and got some immodium and the diarrhea subsided. She would like to be tested for covid.

## 2020-07-22 ENCOUNTER — Encounter: Payer: Self-pay | Admitting: Orthopaedic Surgery

## 2020-07-22 ENCOUNTER — Ambulatory Visit (INDEPENDENT_AMBULATORY_CARE_PROVIDER_SITE_OTHER): Payer: Medicare PPO | Admitting: Orthopaedic Surgery

## 2020-07-22 ENCOUNTER — Other Ambulatory Visit: Payer: Self-pay

## 2020-07-22 DIAGNOSIS — M1711 Unilateral primary osteoarthritis, right knee: Secondary | ICD-10-CM

## 2020-07-22 DIAGNOSIS — G8929 Other chronic pain: Secondary | ICD-10-CM | POA: Diagnosis not present

## 2020-07-22 DIAGNOSIS — M25561 Pain in right knee: Secondary | ICD-10-CM

## 2020-07-22 MED ORDER — ACETAMINOPHEN-CODEINE #3 300-30 MG PO TABS: 1.0000 | ORAL_TABLET | Freq: Three times a day (TID) | ORAL | 0 refills | Status: DC | PRN

## 2020-07-22 MED ORDER — METHYLPREDNISOLONE ACETATE 40 MG/ML IJ SUSP
40.0000 mg | INTRAMUSCULAR | Status: AC | PRN
  Administered 2020-07-22: 40 mg via INTRA_ARTICULAR

## 2020-07-22 MED ORDER — LIDOCAINE HCL 1 % IJ SOLN
3.0000 mL | INTRAMUSCULAR | Status: AC | PRN
  Administered 2020-07-22: 3 mL

## 2020-07-22 NOTE — Progress Notes (Signed)
Office Visit Note   Patient: Debbie Velasquez           Date of Birth: 1951-01-02           MRN: [id number] Visit Date: 07/22/2020              Requested by: Nolene Ebbs, MD [address] [place],  [place] [address] PCP: Nolene Ebbs, MD   Assessment & Plan: Visit Diagnoses:  1. Primary osteoarthritis of right knee   2. Chronic pain of right knee     Plan: I did easily place a steroid injection in the right knee today which she tolerated well.  We will send in some Tylenol 3 for pain control.  Follow-up can be as needed.  My next step would be her to consider knee replacement if he gets to where these injections are not helping.  She will let us know.  Follow-up is as needed.  All questions and concerns were answered and addressed.  Follow-Up Instructions: Return if symptoms worsen or fail to improve.   Orders:  Orders Placed This Encounter  Procedures  . Large Joint Inj   Meds ordered this encounter  Medications  . acetaminophen-codeine (TYLENOL #3) 300-30 MG tablet    Sig: Take 1-2 tablets by mouth every 8 (eight) hours as needed for moderate pain.    Dispense:  30 tablet    Refill:  0      Procedures: Large Joint Inj: R knee on 07/22/2020 3:18 PM Indications: diagnostic evaluation and pain Details: 22 G 1.5 in needle, superolateral approach  Arthrogram: No  Medications: 3 mL lidocaine 1 %; 40 mg methylPREDNISolone acetate 40 MG/ML Outcome: tolerated well, no immediate complications Procedure, treatment alternatives, risks and benefits explained, specific risks discussed. Consent was given by the patient. Immediately prior to procedure a time out was called to verify the correct patient, procedure, equipment, support staff and site/side marked as required. Patient was prepped and draped in the usual sterile fashion.       Clinical Data: No additional findings.   Subjective: Chief Complaint  Patient presents with  . Right Knee - Follow-up  The  patient comes in today with acute flareup of chronic right knee pain.  She has known osteoarthritis of the right knee and has been walking a bit recently on vacation and is now having a significant bout of pain with the right knee.  She is 69 years old and active.  I have actually replaced her left knee.  The last time we placed a steroid injection in her right knee was over 5 months ago.  She denies any other acute change in her medical status.  She is a diabetic but reports excellent control.  She is having to ambulate with a cane.  HPI  Review of Systems She currently denies any headache, chest pain, shortness of breath, fever, chills, nausea, vomiting  Objective: Vital Signs: There were no vitals taken for this visit.  Physical Exam She is alert and orient x3 and in no acute distress but there is obvious discomfort with her right knee when she ambulates. Ortho Exam Examination of her right knee shows pain throughout his arc of motion.  There is pain along the medial and lateral joint line as well as with flexing and extending in the knee.  It is slightly warm with a mild effusion. Specialty Comments:  No specialty comments available.  Imaging: No results found.   PMFS History: Patient Active Problem List  Diagnosis Date Noted  . Facet degeneration of lumbar region 12/18/2018  . Chronic low back pain without sciatica 06/18/2017  . Primary osteoarthritis of right knee 03/26/2017  . Presence of left artificial knee joint 02/26/2017  . Unilateral primary osteoarthritis, left knee 01/12/2017  . 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/12/2017  . Renal mass 12/22/2015   Past Medical History:  Diagnosis Date  . Arthritis   . Cancer ([facility])    Left renal mass  . GERD (gastroesophageal reflux disease)   . History of kidney stones    x3 -remains with one on right.   Marland Kitchen Hx of seasonal allergies    rare flare ups  . Hypertension   . Left renal mass   . Obesity   . Pneumonia     hx of 20 years ago  . Right ureteral stone   . Type 2 diabetes mellitus ([facility])    Type 2  . Wears glasses   . Wears partial dentures    upper    Family History  Problem Relation Age of Onset  . Esophageal cancer Maternal Aunt   . Colon cancer Neg Hx   . Stomach cancer Neg Hx   . Rectal cancer Neg Hx     Past Surgical History:  Procedure Laterality Date  . CESAREAN SECTION  yrs ago  . CYSTOSCOPY WITH RETROGRADE PYELOGRAM, URETEROSCOPY AND STENT PLACEMENT Left 05/24/2018   Procedure: CYSTOSCOPY WITH RETROGRADE PYELOGRAM, URETEROSCOPY AND STENT PLACEMENT;  Surgeon: Alexis Frock, MD;  Location: [facility];  Service: Urology;  Laterality: Left;  . CYSTOSCOPY WITH RETROGRADE PYELOGRAM, URETEROSCOPY AND STENT PLACEMENT Left 02/27/2020   Procedure: CYSTOSCOPY WITH RETROGRADE PYELOGRAM, URETEROSCOPY AND STENT PLACEMENT;  Surgeon: Alexis Frock, MD;  Location: [facility];  Service: Urology;  Laterality: Left;  1 HR  . HOLMIUM LASER APPLICATION Left 01/24/9674   Procedure: HOLMIUM LASER APPLICATION;  Surgeon: Alexis Frock, MD;  Location: [facility];  Service: Urology;  Laterality: Left;  . HOLMIUM LASER APPLICATION Left 06/24/6383   Procedure: HOLMIUM LASER APPLICATION;  Surgeon: Alexis Frock, MD;  Location: [facility];  Service: Urology;  Laterality: Left;  . ROBOTIC ASSITED PARTIAL NEPHRECTOMY Left 12/22/2015   Procedure: XI ROBOTIC ASSITED PARTIAL NEPHRECTOMY;  Surgeon: Alexis Frock, MD;  Location: [facility];  Service: Urology;  Laterality: Left;  . TOTAL ABDOMINAL HYSTERECTOMY W/ BILATERAL SALPINGOOPHORECTOMY  1980's  . TOTAL KNEE ARTHROPLASTY Left 01/12/2017   Procedure: LEFT TOTAL KNEE ARTHROPLASTY and Right knee steroid injection;  Surgeon: Mcarthur Rossetti, MD;  Location: [facility];  Service: Orthopedics;  Laterality: Left;   Social History   Occupational History  . Not on file  Tobacco Use  . Smoking status: Never Smoker  . Smokeless tobacco: Never Used  Vaping Use  . Vaping Use: Never used    Substance and Sexual Activity  . Alcohol use: No    Alcohol/week: 0.0 standard drinks  . Drug use: No  . Sexual activity: Not Currently

## 2020-07-26 ENCOUNTER — Ambulatory Visit: Payer: Medicare PPO | Admitting: Orthopaedic Surgery

## 2020-10-07 ENCOUNTER — Emergency Department (HOSPITAL_BASED_OUTPATIENT_CLINIC_OR_DEPARTMENT_OTHER): Payer: Medicare PPO

## 2020-10-07 ENCOUNTER — Encounter (HOSPITAL_BASED_OUTPATIENT_CLINIC_OR_DEPARTMENT_OTHER): Payer: Self-pay | Admitting: Emergency Medicine

## 2020-10-07 ENCOUNTER — Emergency Department (HOSPITAL_BASED_OUTPATIENT_CLINIC_OR_DEPARTMENT_OTHER)
Admission: EM | Admit: 2020-10-07 | Discharge: 2020-10-07 | Disposition: A | Discharge status: Home / Self Care | Payer: Medicare PPO | Attending: Emergency Medicine | Admitting: Emergency Medicine

## 2020-10-07 ENCOUNTER — Other Ambulatory Visit: Payer: Self-pay

## 2020-10-07 DIAGNOSIS — K219 Gastro-esophageal reflux disease without esophagitis: Secondary | ICD-10-CM | POA: Insufficient documentation

## 2020-10-07 DIAGNOSIS — Z79899 Other long term (current) drug therapy: Secondary | ICD-10-CM | POA: Diagnosis not present

## 2020-10-07 DIAGNOSIS — N200 Calculus of kidney: Secondary | ICD-10-CM

## 2020-10-07 DIAGNOSIS — R059 Cough, unspecified: Secondary | ICD-10-CM | POA: Diagnosis not present

## 2020-10-07 DIAGNOSIS — Z85528 Personal history of other malignant neoplasm of kidney: Secondary | ICD-10-CM | POA: Diagnosis not present

## 2020-10-07 DIAGNOSIS — R079 Chest pain, unspecified: Secondary | ICD-10-CM | POA: Insufficient documentation

## 2020-10-07 DIAGNOSIS — E119 Type 2 diabetes mellitus without complications: Secondary | ICD-10-CM | POA: Insufficient documentation

## 2020-10-07 DIAGNOSIS — D72829 Elevated white blood cell count, unspecified: Secondary | ICD-10-CM | POA: Diagnosis not present

## 2020-10-07 DIAGNOSIS — Z905 Acquired absence of kidney: Secondary | ICD-10-CM | POA: Insufficient documentation

## 2020-10-07 DIAGNOSIS — I10 Essential (primary) hypertension: Secondary | ICD-10-CM | POA: Diagnosis not present

## 2020-10-07 DIAGNOSIS — R0602 Shortness of breath: Secondary | ICD-10-CM | POA: Diagnosis not present

## 2020-10-07 DIAGNOSIS — Z87442 Personal history of urinary calculi: Secondary | ICD-10-CM | POA: Insufficient documentation

## 2020-10-07 DIAGNOSIS — N202 Calculus of kidney with calculus of ureter: Secondary | ICD-10-CM | POA: Insufficient documentation

## 2020-10-07 DIAGNOSIS — Z466 Encounter for fitting and adjustment of urinary device: Secondary | ICD-10-CM | POA: Insufficient documentation

## 2020-10-07 DIAGNOSIS — Z7982 Long term (current) use of aspirin: Secondary | ICD-10-CM | POA: Insufficient documentation

## 2020-10-07 DIAGNOSIS — Z96652 Presence of left artificial knee joint: Secondary | ICD-10-CM | POA: Insufficient documentation

## 2020-10-07 DIAGNOSIS — Z7984 Long term (current) use of oral hypoglycemic drugs: Secondary | ICD-10-CM | POA: Insufficient documentation

## 2020-10-07 DIAGNOSIS — R109 Unspecified abdominal pain: Secondary | ICD-10-CM | POA: Diagnosis present

## 2020-10-07 LAB — CBC
HCT: 32.3 % — ABNORMAL LOW (ref 36.0–46.0)
Hemoglobin: 10 g/dL — ABNORMAL LOW (ref 12.0–15.0)
MCH: 27.5 pg (ref 26.0–34.0)
MCHC: 31 g/dL (ref 30.0–36.0)
MCV: 88.7 fL (ref 80.0–100.0)
Platelets: 349 10*3/uL (ref 150–400)
RBC: 3.64 MIL/uL — ABNORMAL LOW (ref 3.87–5.11)
RDW: 13.9 % (ref 11.5–15.5)
WBC: 11.5 10*3/uL — ABNORMAL HIGH (ref 4.0–10.5)
nRBC: 0 % (ref 0.0–0.2)

## 2020-10-07 LAB — COMPREHENSIVE METABOLIC PANEL
ALT: 10 U/L (ref 0–44)
AST: 16 U/L (ref 15–41)
Albumin: 3.7 g/dL (ref 3.5–5.0)
Alkaline Phosphatase: 72 U/L (ref 38–126)
Anion gap: 11 (ref 5–15)
BUN: 26 mg/dL — ABNORMAL HIGH (ref 8–23)
CO2: 23 mmol/L (ref 22–32)
Calcium: 9.3 mg/dL (ref 8.9–10.3)
Chloride: 102 mmol/L (ref 98–111)
Creatinine, Ser: 1.25 mg/dL — ABNORMAL HIGH (ref 0.44–1.00)
GFR, Estimated: 46 mL/min — ABNORMAL LOW (ref >60)
Glucose, Bld: 110 mg/dL — ABNORMAL HIGH (ref 70–99)
Potassium: 3.8 mmol/L (ref 3.5–5.1)
Sodium: 136 mmol/L (ref 135–145)
Total Bilirubin: 0.5 mg/dL (ref 0.3–1.2)
Total Protein: 7.8 g/dL (ref 6.5–8.1)

## 2020-10-07 LAB — URINALYSIS, ROUTINE W REFLEX MICROSCOPIC
Bilirubin Urine: NEGATIVE
Glucose, UA: NEGATIVE mg/dL
Hgb urine dipstick: NEGATIVE
Ketones, ur: NEGATIVE mg/dL
Leukocytes,Ua: NEGATIVE
Nitrite: NEGATIVE
Protein, ur: NEGATIVE mg/dL
Specific Gravity, Urine: 1.015 (ref 1.005–1.030)
pH: 5 (ref 5.0–8.0)

## 2020-10-07 LAB — LIPASE, BLOOD: Lipase: 56 U/L — ABNORMAL HIGH (ref 11–51)

## 2020-10-07 MED ORDER — HYDROCODONE-ACETAMINOPHEN 5-325 MG PO TABS: 1.0000 | ORAL_TABLET | Freq: Four times a day (QID) | ORAL | 0 refills | Status: DC | PRN

## 2020-10-07 MED ORDER — KETOROLAC TROMETHAMINE 15 MG/ML IJ SOLN
15.0000 mg | Freq: Once | INTRAMUSCULAR | Status: AC
  Administered 2020-10-07: 11:00:00 15 mg via INTRAVENOUS
  Filled 2020-10-07: qty 1

## 2020-10-07 MED ORDER — KETOROLAC TROMETHAMINE 15 MG/ML IJ SOLN
15.0000 mg | Freq: Once | INTRAMUSCULAR | Status: AC
  Administered 2020-10-07: 13:00:00 15 mg via INTRAVENOUS
  Filled 2020-10-07: qty 1

## 2020-10-07 NOTE — ED Provider Notes (Signed)
[facility] EMERGENCY DEPARTMENT Provider Note   CSN: [id number] Arrival date & time: 10/07/20  0957     History Chief Complaint  Patient presents with   Abdominal Pain   Flank Pain    Debbie Velasquez is a 69 y.o. female.  Presenting for evaluation of abdominal and flank pain.  Patient states when she woke up this morning, she had pain in her back that radiates to both sides.  She also has mild anterior abdominal pain.  She is associated nausea, no vomiting.  She urinated and noted sediment in her urine, had slight improvement of symptoms but then pain gradually worsened.  She reports associated hematuria, no dysuria.  She has not taken anything for pain including Tylenol or ibuprofen.  She has fevers, chills, chest pain, shortness breath, cough, vomiting.  She had loose stools this morning, this is resolved with Imodium.  No blood in her stool.  No diarrhea.  She was feeling well yesterday, no fevers or urinary symptoms at that time.  She has a history of frequent kidney stones, follows with Dr. Tresa Moore from urology.  Last procedure with Dr. Tresa Moore was in May 2021, had stents placed.  Additional history obtained from chart review.  Patient with a history of left renal mass status post partial nephrectomy, GERD, frequent kidney stones, hypertension, diabetes.  HPI     Past Medical History:  Diagnosis Date   Arthritis    Cancer ([facility])    Left renal mass   GERD (gastroesophageal reflux disease)    History of kidney stones    x3 -remains with one on right.    Hx of seasonal allergies    rare flare ups   Hypertension    Kidney stones    Left renal mass    Obesity    Pneumonia    hx of 20 years ago   Right ureteral stone    Type 2 diabetes mellitus ([facility])    Type 2   Wears glasses    Wears partial dentures    upper    Patient Active Problem List   Diagnosis Date Noted   Facet degeneration of lumbar region 12/18/2018   Chronic low back pain  without sciatica 06/18/2017   Primary osteoarthritis of right knee 03/26/2017   Presence of left artificial knee joint 02/26/2017   Unilateral primary osteoarthritis, left knee 01/12/2017   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/12/2017   Renal mass 12/22/2015    Past Surgical History:  Procedure Laterality Date   CESAREAN SECTION  yrs ago   [place], URETEROSCOPY AND STENT PLACEMENT Left 05/24/2018   Procedure: CYSTOSCOPY WITH RETROGRADE PYELOGRAM, URETEROSCOPY AND STENT PLACEMENT;  Surgeon: Alexis Frock, MD;  Location: [facility];  Service: Urology;  Laterality: Left;   CYSTOSCOPY WITH RETROGRADE PYELOGRAM, URETEROSCOPY AND STENT PLACEMENT Left 02/27/2020   Procedure: CYSTOSCOPY WITH RETROGRADE PYELOGRAM, URETEROSCOPY AND STENT PLACEMENT;  Surgeon: Alexis Frock, MD;  Location: [facility];  Service: Urology;  Laterality: Left;  1 HR   HOLMIUM LASER APPLICATION Left 04/27/5464   Procedure: HOLMIUM LASER APPLICATION;  Surgeon: Alexis Frock, MD;  Location: [facility];  Service: Urology;  Laterality: Left;   HOLMIUM LASER APPLICATION Left 0/12/5463   Procedure: HOLMIUM LASER APPLICATION;  Surgeon: Alexis Frock, MD;  Location: [facility];  Service: Urology;  Laterality: Left;   ROBOTIC ASSITED PARTIAL NEPHRECTOMY Left 12/22/2015   Procedure: XI ROBOTIC ASSITED PARTIAL NEPHRECTOMY;  Surgeon: Alexis Frock, MD;  Location: [facility];  Service: Urology;  Laterality: Left;   TOTAL ABDOMINAL HYSTERECTOMY W/ BILATERAL SALPINGOOPHORECTOMY  1980's   TOTAL KNEE ARTHROPLASTY Left 01/12/2017   Procedure: LEFT TOTAL KNEE ARTHROPLASTY and Right knee steroid injection;  Surgeon: Mcarthur Rossetti, MD;  Location: [facility];  Service: Orthopedics;  Laterality: Left;     OB History   No obstetric history on file.     Family History  Problem Relation Age of Onset   Esophageal cancer Maternal Aunt    Colon cancer Neg Hx    Stomach cancer Neg Hx    Rectal cancer Neg Hx      Social History   Tobacco Use   Smoking status: Never Smoker   Smokeless tobacco: Never Used  Vaping Use   Vaping Use: Never used  Substance Use Topics   Alcohol use: No    Alcohol/week: 0.0 standard drinks   Drug use: No    Home Medications Prior to Admission medications   Medication Sig Start Date End Date Taking? Authorizing Provider  acetaminophen-codeine (TYLENOL #3) 300-30 MG tablet Take 1-2 tablets by mouth every 8 (eight) hours as needed for moderate pain. 07/22/20   Mcarthur Rossetti, MD  Alum Hydroxide-Mag Trisilicate (GAVISCON) 46-96.2 MG CHEW Chew 2 tablets by mouth daily as needed (indigestion).    [provider]  aspirin 81 MG chewable tablet Chew 1 tablet (81 mg total) by mouth 2 (two) times daily. Patient taking differently: Chew 81 mg by mouth daily.  01/15/17   Mcarthur Rossetti, MD  HYDROcodone-acetaminophen (NORCO/VICODIN) 5-325 MG tablet Take 1 tablet by mouth every 6 (six) hours as needed for severe pain. 10/07/20   Lasheena Frieze, PA-C  losartan-hydrochlorothiazide (HYZAAR) 100-25 MG tablet Take 1 tablet by mouth daily. 03/16/20   [provider]  metFORMIN (GLUCOPHAGE) 500 MG tablet Take 500 mg by mouth daily with breakfast.     [provider]  Multiple Vitamins-Minerals (MULTIVITAMIN WITH MINERALS) tablet Take 2 tablets by mouth daily. Vita fusion Chewable    [provider]  Vitamin D, Ergocalciferol, (DRISDOL) 50000 units CAPS capsule Take 50,000 Units by mouth every 7 (seven) days. Thursday 08/06/17   [provider]    Allergies    Patient has no known allergies.  Review of Systems   Review of Systems  Gastrointestinal: Positive for nausea.  Genitourinary: Positive for flank pain and hematuria.  All other systems reviewed and are negative.   Physical Exam Updated Vital Signs BP 125/71 (BP Location: Left Arm)    Pulse (!) 56    Temp 98.5 F (36.9 C) (Oral)    Resp 16    Ht 5\' 4"   (1.626 m)    Wt 115.7 kg    SpO2 99%    BMI 43.77 kg/m   Physical Exam Vitals and nursing note reviewed.  Constitutional:      General: She is not in acute distress.    Appearance: She is well-developed and well-nourished. She is obese.     Comments: Resting in the bed in NAD  HENT:     Head: Normocephalic and atraumatic.  Eyes:     Extraocular Movements: EOM normal.     Conjunctiva/sclera: Conjunctivae normal.     Pupils: Pupils are equal, round, and reactive to light.  Cardiovascular:     Rate and Rhythm: Normal rate and regular rhythm.     Pulses: Normal pulses and intact distal pulses.  Pulmonary:     Effort: Pulmonary effort is normal. No respiratory distress.  Breath sounds: Normal breath sounds. No wheezing.  Abdominal:     General: Bowel sounds are normal. There is no distension.     Palpations: Abdomen is soft. There is no mass.     Tenderness: There is no abdominal tenderness. There is no right CVA tenderness, left CVA tenderness, guarding or rebound.     Comments: No ttp of abd or CVA. Pt states pain is more inside. No rigidity, guarding, distention. Negative rebound  Musculoskeletal:        General: Normal range of motion.     Cervical back: Normal range of motion and neck supple.     Right lower leg: No edema.     Left lower leg: No edema.  Skin:    General: Skin is warm and dry.     Capillary Refill: Capillary refill takes less than 2 seconds.  Neurological:     Mental Status: She is alert and oriented to person, place, and time.  Psychiatric:        Mood and Affect: Mood and affect normal.     ED Results / Procedures / Treatments   Labs (all labs ordered are listed, but only abnormal results are displayed) Labs Reviewed  LIPASE, BLOOD - Abnormal; Notable for the following components:      Result Value   Lipase 56 (*)    All other components within normal limits  COMPREHENSIVE METABOLIC PANEL - Abnormal; Notable for the following components:    Glucose, Bld 110 (*)    BUN 26 (*)    Creatinine, Ser 1.25 (*)    GFR, Estimated 46 (*)    All other components within normal limits  CBC - Abnormal; Notable for the following components:   WBC 11.5 (*)    RBC 3.64 (*)    Hemoglobin 10.0 (*)    HCT 32.3 (*)    All other components within normal limits  URINALYSIS, ROUTINE W REFLEX MICROSCOPIC    EKG None  Radiology CT Renal Stone Study  Result Date: 10/07/2020 CLINICAL DATA:  69 year old female with flank and abdominal pain. Back pain, hematuria, abnormal urine. EXAM: CT ABDOMEN AND PELVIS WITHOUT CONTRAST TECHNIQUE: Multidetector CT imaging of the abdomen and pelvis was performed following the standard protocol without IV contrast. COMPARISON:  Noncontrast CT Abdomen and Pelvis 02/22/2020. FINDINGS: Lower chest: Cardiac size at the upper limits of normal. Otherwise negative. Hepatobiliary: Negative noncontrast liver and gallbladder. Pancreas: Negative. Spleen: Negative. Adrenals/Urinary Tract: Normal adrenal glands. Chronic bilateral nephrolithiasis. Developing right lower pole staghorn calculus, elongated and up to 2 cm now (coronal image 58). No right hydronephrosis or perinephric stranding. Obstructed and inflamed left kidney with moderate to severe hydronephrosis and moderate perinephric stranding. Left hydroureter and periureteral stranding continuing into the pelvis. And at the left ureterovesical junction and elongated 9 mm obstructing calculus or adjacent stones is present (coronal image 77). Furthermore, there is also a punctate calculus in the upstream distal ureter about 2 cm proximal from the large stone (coronal image 81). Only mild residual left lower pole nephrolithiasis now compared to May. Diminutive and unremarkable urinary bladder. Stomach/Bowel: Decompressed large bowel. Mild diverticulosis in the sigmoid. Diminutive, normal appendix. Negative terminal ileum. No dilated small bowel. Possible small gastric hiatal hernia but  otherwise negative stomach. No free air, free fluid. Vascular/Lymphatic: Normal caliber abdominal aorta. Mild calcified atherosclerosis. Vascular patency is not evaluated in the absence of IV contrast. Reproductive: Surgically absent uterus. Diminutive or absent ovaries. Other: No pelvic free fluid. Musculoskeletal: Widespread lumbar  facet arthropathy. Advanced bilateral hip joint degeneration. No acute osseous abnormality identified. IMPRESSION: 1. Severe obstructive uropathy on the left with possible forniceal rupture and at least two distal left ureteral calculi, the larger up to 9 mm at the left UVJ. 2. Chronic bilateral nephrolithiasis with 20 mm developing right lower pole staghorn calculus. 3. 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 Electronically Signed   By: Genevie Ann M.D.   On: 10/07/2020 11:07    Procedures Procedures (including critical care time)  Medications Ordered in ED Medications  ketorolac (TORADOL) 15 MG/ML injection 15 mg (15 mg Intravenous Given 10/07/20 1107)  ketorolac (TORADOL) 15 MG/ML injection 15 mg (15 mg Intravenous Given 10/07/20 1249)    ED Course  I have reviewed the triage vital signs and the nursing notes.  Pertinent labs & imaging results that were available during my care of the patient were reviewed by me and considered in my medical decision making (see chart for details).    MDM Rules/Calculators/A&P                          Patient presenting for evaluation of flank and abdominal pain with associated hematuria in the setting of frequent kidney stone infections.  On exam, patient appears nontoxic.  No infectious symptoms including fever or dysuria.  Likely kidney stone.  However, as patient also had loose stools this morning and pain is not reproducible, consider GI cause.  Considering patient's age, will obtain urine, labs, CT renal.  Labs interpreted by me, overall reassuring.  Mild leukocytosis of 11.5, likely due to kidney stone.   Creatinine at baseline.  Lipase at baseline.  Urine without signs of infection.  CT renal shows 2 distal left ureteral calculi, largest up to 9 mm at the UVJ.  Patient also with chronic chronic bilateral nephrolithiasis with a developing right lower pole staghorn calculus.  On reassessment, patient reports improvement of pain with Toradol.  Discussed importance of close follow-up with urology.  Discussed symptomatic control and importance of hydration.  Strict return precautions given.  At this time, patient appears safe for discharge.  Patient states she understands and agrees to plan.  Final Clinical Impression(s) / ED Diagnoses Final diagnoses:  Kidney stone  Staghorn calculus    Rx / DC Orders ED Discharge Orders         Ordered    HYDROcodone-acetaminophen (NORCO/VICODIN) 5-325 MG tablet  Every 6 hours PRN        10/07/20 1217           Ancelmo Hunt, PA-C 10/07/20 Aldrich, Ankit, MD 10/07/20 1604

## 2020-10-07 NOTE — Discharge Instructions (Signed)
Continue taking home medications as prescribed. Use Tylenol ibuprofen as needed for mild to moderate pain.  Use Norco as needed for severe breakthrough pain.  Have caution, this make you tired or groggy.  Do not drive or operate heavy machinery while taking this medicine. If the medicine is too strong, you may also take 1/2 tablet. It is extremely important that you follow-up with Dr. Tresa Moore for further evaluation of your kidney stones. Return to the emergency room if you develop fevers, persistent vomiting, severe worsening pain, inability urinate, or any new, worsening, or concerning symptoms.

## 2020-10-07 NOTE — ED Triage Notes (Signed)
Pt states she started to having bilateral flank and abdominal pain since this am.  Pt states she has kidney stones and has seen them in her urine.

## 2020-10-09 ENCOUNTER — Emergency Department (HOSPITAL_BASED_OUTPATIENT_CLINIC_OR_DEPARTMENT_OTHER): Payer: Medicare PPO

## 2020-10-09 ENCOUNTER — Observation Stay (HOSPITAL_BASED_OUTPATIENT_CLINIC_OR_DEPARTMENT_OTHER)
Admission: EM | Admit: 2020-10-09 | Discharge: 2020-10-10 | Disposition: A | Discharge status: Home / Self Care | Payer: Medicare PPO | Attending: Emergency Medicine | Admitting: Emergency Medicine

## 2020-10-09 ENCOUNTER — Other Ambulatory Visit: Payer: Self-pay

## 2020-10-09 ENCOUNTER — Encounter (HOSPITAL_BASED_OUTPATIENT_CLINIC_OR_DEPARTMENT_OTHER): Payer: Self-pay | Admitting: Emergency Medicine

## 2020-10-09 ENCOUNTER — Ambulatory Visit
Admission: EM | Admit: 2020-10-09 | Discharge: 2020-10-09 | Disposition: A | Discharge status: Home / Self Care | Payer: Medicare PPO

## 2020-10-09 DIAGNOSIS — Z85528 Personal history of other malignant neoplasm of kidney: Secondary | ICD-10-CM | POA: Diagnosis not present

## 2020-10-09 DIAGNOSIS — Z79899 Other long term (current) drug therapy: Secondary | ICD-10-CM | POA: Insufficient documentation

## 2020-10-09 DIAGNOSIS — Z20822 Contact with and (suspected) exposure to covid-19: Secondary | ICD-10-CM | POA: Diagnosis not present

## 2020-10-09 DIAGNOSIS — R110 Nausea: Secondary | ICD-10-CM | POA: Diagnosis present

## 2020-10-09 DIAGNOSIS — Z96652 Presence of left artificial knee joint: Secondary | ICD-10-CM | POA: Diagnosis not present

## 2020-10-09 DIAGNOSIS — R0789 Other chest pain: Secondary | ICD-10-CM | POA: Diagnosis not present

## 2020-10-09 DIAGNOSIS — R079 Chest pain, unspecified: Secondary | ICD-10-CM | POA: Diagnosis present

## 2020-10-09 DIAGNOSIS — E119 Type 2 diabetes mellitus without complications: Secondary | ICD-10-CM | POA: Diagnosis not present

## 2020-10-09 DIAGNOSIS — Z7982 Long term (current) use of aspirin: Secondary | ICD-10-CM | POA: Diagnosis not present

## 2020-10-09 DIAGNOSIS — I10 Essential (primary) hypertension: Secondary | ICD-10-CM | POA: Insufficient documentation

## 2020-10-09 DIAGNOSIS — Z7984 Long term (current) use of oral hypoglycemic drugs: Secondary | ICD-10-CM | POA: Diagnosis not present

## 2020-10-09 DIAGNOSIS — K219 Gastro-esophageal reflux disease without esophagitis: Secondary | ICD-10-CM | POA: Diagnosis present

## 2020-10-09 LAB — RESP PANEL BY RT-PCR (FLU A&B, COVID) ARPGX2
Influenza A by PCR: NEGATIVE
Influenza B by PCR: NEGATIVE
SARS Coronavirus 2 by RT PCR: NEGATIVE

## 2020-10-09 LAB — BASIC METABOLIC PANEL
Anion gap: 11 (ref 5–15)
BUN: 28 mg/dL — ABNORMAL HIGH (ref 8–23)
CO2: 25 mmol/L (ref 22–32)
Calcium: 9.2 mg/dL (ref 8.9–10.3)
Chloride: 102 mmol/L (ref 98–111)
Creatinine, Ser: 1.22 mg/dL — ABNORMAL HIGH (ref 0.44–1.00)
GFR, Estimated: 47 mL/min — ABNORMAL LOW (ref >60)
Glucose, Bld: 87 mg/dL (ref 70–99)
Potassium: 4 mmol/L (ref 3.5–5.1)
Sodium: 138 mmol/L (ref 135–145)

## 2020-10-09 LAB — CBC
HCT: 33.6 % — ABNORMAL LOW (ref 36.0–46.0)
Hemoglobin: 10.3 g/dL — ABNORMAL LOW (ref 12.0–15.0)
MCH: 27.1 pg (ref 26.0–34.0)
MCHC: 30.7 g/dL (ref 30.0–36.0)
MCV: 88.4 fL (ref 80.0–100.0)
Platelets: 397 10*3/uL (ref 150–400)
RBC: 3.8 MIL/uL — ABNORMAL LOW (ref 3.87–5.11)
RDW: 14.1 % (ref 11.5–15.5)
WBC: 10.4 10*3/uL (ref 4.0–10.5)
nRBC: 0 % (ref 0.0–0.2)

## 2020-10-09 LAB — GLUCOSE, CAPILLARY: Glucose-Capillary: 82 mg/dL (ref 70–99)

## 2020-10-09 LAB — TROPONIN I (HIGH SENSITIVITY)
Troponin I (High Sensitivity): 4 ng/L (ref <18)
Troponin I (High Sensitivity): 3 ng/L (ref <18)

## 2020-10-09 MED ORDER — ACETAMINOPHEN 650 MG RE SUPP: 650.0000 mg | Freq: Four times a day (QID) | RECTAL | Status: DC | PRN

## 2020-10-09 MED ORDER — HYDROCODONE-ACETAMINOPHEN 5-325 MG PO TABS: 1.0000 | ORAL_TABLET | Freq: Four times a day (QID) | ORAL | Status: DC | PRN

## 2020-10-09 MED ORDER — HYDRALAZINE HCL 25 MG PO TABS: 25.0000 mg | ORAL_TABLET | Freq: Four times a day (QID) | ORAL | Status: DC | PRN

## 2020-10-09 MED ORDER — ACETAMINOPHEN 325 MG PO TABS: 650.0000 mg | ORAL_TABLET | Freq: Four times a day (QID) | ORAL | Status: DC | PRN

## 2020-10-09 MED ORDER — LOSARTAN POTASSIUM 50 MG PO TABS
100.0000 mg | ORAL_TABLET | Freq: Every day | ORAL | Status: DC
  Administered 2020-10-10: 09:00:00 100 mg via ORAL
  Filled 2020-10-09 (×2): qty 2

## 2020-10-09 MED ORDER — ONDANSETRON HCL 4 MG/2ML IJ SOLN: 4.0000 mg | Freq: Four times a day (QID) | INTRAMUSCULAR | Status: DC | PRN

## 2020-10-09 MED ORDER — INSULIN ASPART 100 UNIT/ML ~~LOC~~ SOLN: 0.0000 [IU] | Freq: Three times a day (TID) | SUBCUTANEOUS | Status: DC

## 2020-10-09 MED ORDER — NITROGLYCERIN 0.4 MG SL SUBL: 0.4000 mg | SUBLINGUAL_TABLET | SUBLINGUAL | Status: DC | PRN

## 2020-10-09 MED ORDER — ASPIRIN 81 MG PO CHEW
81.0000 mg | CHEWABLE_TABLET | Freq: Every day | ORAL | Status: DC
  Administered 2020-10-10: 09:00:00 81 mg via ORAL
  Filled 2020-10-09: qty 1

## 2020-10-09 MED ORDER — ASPIRIN 325 MG PO TABS
325.0000 mg | ORAL_TABLET | Freq: Once | ORAL | Status: AC
  Administered 2020-10-09: 22:00:00 325 mg via ORAL
  Filled 2020-10-09: qty 1

## 2020-10-09 NOTE — H&P (Signed)
History and Physical    PLEASE NOTE THAT DRAGON DICTATION SOFTWARE WAS USED IN THE CONSTRUCTION OF THIS NOTE.   SINIA ANTOSH Velasquez:[id number] DOB: 02/18/1951 DOA: 10/09/2020  PCP: Nolene Ebbs, MD Patient coming from: Transfer from [place]  I have personally briefly reviewed patient's old medical records in [name]  Chief Complaint: Chest pain  HPI: Debbie Velasquez is a 69 y.o. female with medical history significant for type 2 diabetes mellitus, hypertension, GERD, left renal carcinoma status post partial left nephrectomy in 2017, who is admitted to [facility] on 10/09/2020 as transfer from [place] ED for further evaluation of chest pain after presenting to the latter facility complaining of such.   The patient reports intermittent left-sided chest pain starting on the evening of 10/08/2020.  She describes the chest pain as nonradiating pressure that has been associated with nausea in the absence of any vomiting.  She also denies any associated diaphoresis, palpitations, presyncope, or syncope.  She reports a total of 2 episodes of this type of chest pain, with the first such episode occurring at rest on the evening of 10/08/2020.  Chest pain is nonexertional and she has not received any nitroglycerin to evaluate the effect this medication on her chest pain.  The episode last evening lasted for approximately 20 minutes before spontaneously resolving.  She remained chest pain-free overnight until awaking on the morning of 10/09/2020 with similar left-sided chest discomfort of similar quality, location, and intensity.  The second episode was also self-limited and nonexertional.  Subsequently, she reports no additional chest pain, and confirms that she is currently chest pain-free.  Episodes of chest pain were nonpositional, nonpleuritic, and not reproducible with palpation over the left anterior chest wall.  Denies any prior history  of experiencing chest pain similar to these two episodes.   Denies any associated shortness of breath, orthopnea, PND, new onset peripheral edema.  Not associated with any recent cough, wheezing, hemoptysis, lower extremity erythema, or calf tenderness.  Denies any recent trauma, travel, or personal history of DVT/PE.  Denies any associated subjective fever, chills, rigors, or generalized myalgias.  No recent headache, neck stiffness, rhinitis, rhinorrhea, sore throat, abdominal pain, diarrhea, or rash.  No known COVID-19 exposures.  Denies any associated dysuria, gross hematuria, or change in urinary urgency/frequency.  Denies any known history of underlying coronary disease, but reports that she has never previously undergone cardiac ischemic work-up, including no history of stress test or coronary angiography.  Confirms a history of hypertension type 2 diabetes mellitus, but denies any known underlying history of hyperlipidemia or family history of premature coronary artery disease.  Reports that she is lifelong non-smoker.  She initially presented to local urgent care for evaluation of the above chest pain, but, in the context of multiple risk factors for CAD, was instructed to go to the local emergency department for further evaluation, prompting her to present to [place] on the morning of 10/09/2020.      [facility] ED Course:  Vital signs in the ED were notable for the following: Temperature max 98.8, heart rate 62-85, blood pressure 143/64 - 161/61, respiratory rate 15-18, oxygen saturation 99% on room air.  Labs were notable for the following: BMP was notable for the following: Sodium 138, potassium 4.0, bicarbonate 25, creatinine 1.22 relative to most recent prior creatinine) 1.25 on 10/07/2020.  High-sensitivity troponin I x2 were found to be 4 followed by value of 3.  CBC was notable for the following: White blood cell count of 10,400, hemoglobin 10.3, up slightly from most recent  prior value 10.0 on 10/07/2020.  Screening nasopharyngeal COVID-19/influenza PCR were performed at [facility] ED today, and found to be negative.  Chest x-ray showed no evidence of acute cardiopulmonary process, including no evidence of infiltrate, edema, effusion, or pneumothorax.  EKG showed sinus rhythm with first-degree AV block, heart rate 71, PR interval 210, QTc 397, nonspecific T wave inversion in V1, which was also present on most recent prior EKG from December 2020, and showed no evidence of ST changes, including no evidence of ST elevation.  While in the [facility] ED, the following were administered: (No medications or IV fluids were administered in the ED).   The [facility] ED provider, Coral Ceo, [place], discussed the patient's case with Dr. Roosevelt Locks of [facility] at [facility], who accepted patient for admission at [facility] for further evaluation and management of presenting chest pain.    Review of Systems: As per HPI otherwise 10 point review of systems negative.   Past Medical History:  Diagnosis Date  . Arthritis   . Cancer ([place])    Left renal mass  . GERD (gastroesophageal reflux disease)   . History of kidney stones    x3 -remains with one on right.   Marland Kitchen Hx of seasonal allergies    rare flare ups  . Hypertension   . Kidney stones   . Left renal mass   . Obesity   . Pneumonia    hx of 20 years ago  . Right ureteral stone   . Type 2 diabetes mellitus (HCC)    Type 2  . Wears glasses   . Wears partial dentures    upper    Past Surgical History:  Procedure Laterality Date  . CESAREAN SECTION  yrs ago  . CYSTOSCOPY WITH RETROGRADE PYELOGRAM, URETEROSCOPY AND STENT PLACEMENT Left 05/24/2018   Procedure: CYSTOSCOPY WITH RETROGRADE PYELOGRAM, URETEROSCOPY AND STENT PLACEMENT;  Surgeon: Alexis Frock, MD;  Location: [facility];  Service: Urology;  Laterality: Left;  . CYSTOSCOPY WITH RETROGRADE PYELOGRAM, URETEROSCOPY AND STENT PLACEMENT Left 02/27/2020   Procedure:  CYSTOSCOPY WITH RETROGRADE PYELOGRAM, URETEROSCOPY AND STENT PLACEMENT;  Surgeon: Alexis Frock, MD;  Location: [facility];  Service: Urology;  Laterality: Left;  1 HR  . HOLMIUM LASER APPLICATION Left 03/01/7415   Procedure: HOLMIUM LASER APPLICATION;  Surgeon: Alexis Frock, MD;  Location: [facility];  Service: Urology;  Laterality: Left;  . HOLMIUM LASER APPLICATION Left 12/29/4534   Procedure: HOLMIUM LASER APPLICATION;  Surgeon: Alexis Frock, MD;  Location: [facility];  Service: Urology;  Laterality: Left;  . ROBOTIC ASSITED PARTIAL NEPHRECTOMY Left 12/22/2015   Procedure: XI ROBOTIC ASSITED PARTIAL NEPHRECTOMY;  Surgeon: Alexis Frock, MD;  Location: [facility];  Service: Urology;  Laterality: Left;  . TOTAL ABDOMINAL HYSTERECTOMY W/ BILATERAL SALPINGOOPHORECTOMY  1980's  . TOTAL KNEE ARTHROPLASTY Left 01/12/2017   Procedure: LEFT TOTAL KNEE ARTHROPLASTY and Right knee steroid injection;  Surgeon: Mcarthur Rossetti, MD;  Location: [facility];  Service: Orthopedics;  Laterality: Left;    Social History:  reports that she has never smoked. She has never used smokeless tobacco. She reports that she does not drink alcohol and does not use drugs.   No Known Allergies  Family History  Problem Relation Age of Onset  . Esophageal cancer Maternal Aunt   . Colon cancer Neg Hx   . Stomach cancer Neg Hx   .  Rectal cancer Neg Hx      Prior to Admission medications   Medication Sig Start Date End Date Taking? Authorizing Provider  acetaminophen-codeine (TYLENOL #3) 300-30 MG tablet Take 1-2 tablets by mouth every 8 (eight) hours as needed for moderate pain. 07/22/20  Yes Mcarthur Rossetti, MD  losartan-hydrochlorothiazide (HYZAAR) 100-25 MG tablet Take 1 tablet by mouth daily. 03/16/20  Yes [provider]  metFORMIN (GLUCOPHAGE) 500 MG tablet Take 500 mg by mouth daily with breakfast.    Yes [provider]  Vitamin D, Ergocalciferol, (DRISDOL) 50000 units CAPS capsule Take 50,000  Units by mouth every 7 (seven) days. Thursday 08/06/17  Yes [provider]  Alum Hydroxide-Mag Trisilicate 03-50.0 MG CHEW Chew 2 tablets by mouth daily as needed (indigestion).    [provider]  aspirin 81 MG chewable tablet Chew 1 tablet (81 mg total) by mouth 2 (two) times daily. Patient taking differently: Chew 81 mg by mouth daily. 01/15/17   Mcarthur Rossetti, MD  HYDROcodone-acetaminophen (NORCO/VICODIN) 5-325 MG tablet Take 1 tablet by mouth every 6 (six) hours as needed for severe pain. 10/07/20   Caccavale, Sophia, PA-C  Multiple Vitamins-Minerals (MULTIVITAMIN WITH MINERALS) tablet Take 2 tablets by mouth daily. Vita fusion Chewable    [provider]     Objective    Physical Exam: Vitals:   10/09/20 1700 10/09/20 1730 10/09/20 1800 10/09/20 1950  BP: (!) 161/61 (!) 141/54 (!) 150/51 (!) 154/75  Pulse: 71 74 85 71  Resp: 19 15 13 18   Temp:    98.8 F (37.1 C)  TempSrc:    Oral  SpO2: 100% 100% 100% 100%  Weight:      Height:        General: appears to be stated age; alert, oriented Skin: warm, dry, no rash Head:  AT/[place] Mouth:  Oral mucosa membranes appear moist, normal dentition Neck: supple; trachea midline Heart:  RRR; did not appreciate any M/R/G Lungs: CTAB, did not appreciate any wheezes, rales, or rhonchi Abdomen: + BS; soft, ND, NT Vascular: 2+ pedal pulses b/l; 2+ radial pulses b/l Extremities: no peripheral edema, no muscle wasting Neuro: strength and sensation intact in upper and lower extremities b/l  Labs on Admission: I have personally reviewed following labs and imaging studies  CBC: Recent Labs  Lab 10/07/20 1015 10/09/20 1256  WBC 11.5* 10.4  HGB 10.0* 10.3*  HCT 32.3* 33.6*  MCV 88.7 88.4  PLT 349 938   Basic Metabolic Panel: Recent Labs  Lab 10/07/20 1015 10/09/20 1256  NA 136 138  K 3.8 4.0  CL 102 102  CO2 23 25  GLUCOSE 110* 87  BUN 26* 28*  CREATININE 1.25* 1.22*  CALCIUM 9.3 9.2    GFR: Estimated Creatinine Clearance: 53.7 mL/min (A) (by C-G formula based on SCr of 1.22 mg/dL (H)). Liver Function Tests: Recent Labs  Lab 10/07/20 1015  AST 16  ALT 10  ALKPHOS 72  BILITOT 0.5  PROT 7.8  ALBUMIN 3.7   Recent Labs  Lab 10/07/20 1015  LIPASE 56*   No results for input(s): AMMONIA in the last 168 hours. Coagulation Profile: No results for input(s): INR, PROTIME in the last 168 hours. Cardiac Enzymes: No results for input(s): CKTOTAL, CKMB, CKMBINDEX, TROPONINI in the last 168 hours. BNP (last 3 results) No results for input(s): PROBNP in the last 8760 hours. HbA1C: No results for input(s): HGBA1C in the last 72 hours. CBG: No results for input(s): GLUCAP in the last  168 hours. Lipid Profile: No results for input(s): CHOL, HDL, LDLCALC, TRIG, CHOLHDL, LDLDIRECT in the last 72 hours. Thyroid Function Tests: No results for input(s): TSH, T4TOTAL, FREET4, T3FREE, THYROIDAB in the last 72 hours. Anemia Panel: No results for input(s): VITAMINB12, FOLATE, FERRITIN, TIBC, IRON, RETICCTPCT in the last 72 hours. Urine analysis:    Component Value Date/Time   COLORURINE YELLOW 10/07/2020 1015   APPEARANCEUR CLEAR 10/07/2020 1015   LABSPEC 1.015 10/07/2020 1015   PHURINE 5.0 10/07/2020 1015   GLUCOSEU NEGATIVE 10/07/2020 1015   HGBUR NEGATIVE 10/07/2020 1015   BILIRUBINUR NEGATIVE 10/07/2020 1015   KETONESUR NEGATIVE 10/07/2020 1015   PROTEINUR NEGATIVE 10/07/2020 1015   UROBILINOGEN 0.2 08/13/2015 0318   NITRITE NEGATIVE 10/07/2020 1015   LEUKOCYTESUR NEGATIVE 10/07/2020 1015    Radiological Exams on Admission: DG Chest Portable 1 View  Result Date: 10/09/2020 CLINICAL DATA:  Chest pressure. EXAM: PORTABLE CHEST 1 VIEW COMPARISON:  November 17, 2017 FINDINGS: There is stable blunting of the left costophrenic angle. The heart size is unremarkable. There is no pneumothorax. No focal infiltrate. No acute osseous abnormality. IMPRESSION: No active disease.  Electronically Signed   By: Constance Holster M.D.   On: 10/09/2020 15:31     EKG: Independently reviewed, with result as described above.    Assessment/Plan   SUNI JARNAGIN is a 69 y.o. female with medical history significant for type 2 diabetes mellitus, hypertension, GERD, left renal carcinoma status post partial left nephrectomy in 2017, who is admitted to [facility] on 10/09/2020 as transfer from [place] ED for further evaluation of chest pain after presenting to the latter facility complaining of such.    Principal Problem:   Chest pain Active Problems:   Nausea   Hypertension   Type 2 diabetes mellitus (HCC)   GERD (gastroesophageal reflux disease)    #) Atypical chest pain: Intermittent left-sided chest pressure that has been nonexertional and self-limited in the absence of any administration of nitroglycerin, which appears to be atypical for ACS.  However, given the presence of some typical characteristics in this patient with multiple CAD risk factors and no prior cardiac ischemic evaluation, and presenting heart score of 4, conveying moderate risk of major acute cardiac event over the ensuing 6 weeks, the decision was made to admit patient for overnight observation in order to rule out ACS.  Of note, troponin x2 have been negative thus far, while EKG shows no evidence of acute changes, including no evidence of STEMI, while chest x-ray shows no evidence of acute cardiopulmonary process, clear evidence of pneumothorax.  Patient currently chest pain-free.  It does not appear that she has previously received full dose aspirin, and therefore will administer a single dose of this at the present time.  Additionally, echocardiogram has been ordered.  Should the patient subsequently will operate CS, can consider possibility of discussing case with cardiology for assistance in determining the necessity/nature of additional ischemic evaluation in the  context of the patient's age, gender, and risk factors.  Aside from ACS, which appears clinically less likely at the present time, differential for patient's presenting chest pain also includes noncardiac etiologies including musculoskeletal possibilities such as costochondritis, GI sources including GERD, particularly in the context of a documented history of such versus esophagitis versus gastritis versus PUD versus esophageal spasm.  Presentation is clinically less suggestive of acute pulmonary embolism.   Plan: Trend serial troponin.  Monitor on telemetry. PRN sublingual nitroglycerin.  As needed EKG  for any subsequent episode of chest pain.  Repeat BMP and CBC in the morning.  Echocardiogram to assess for focal wall motion normalities as means of helping to guide further necessity/nature of additional ischemic work-up should the patient rule out for ACS.  Full dose aspirin x1 now.  Check lipase.     #) Nausea: Patient reports mild intermittent nausea associated with her intermittent episodes of chest pain, as further described above.  Not associate with any vomiting.  Kidney function appears to be at baseline, no overt electrolyte abnormalities at time of presentation.  Currently denies any residual nausea.  No evidence of QTC prolongation on EKG to limit antiemetic options at this time.  Plan: As needed IV Zofran.  Additional work-up and management of presenting chest pain, as above.  Repeat BMP and CBC in the morning.  Check serum magnesium level.  Check lipase, as above.      #) Type 2 Diabetes Mellitus: Appears well controlled as an outpatient, with most recent A1c noted to be 6.2% when checked on 02/25/2020. Home insulin regimen: (none). Home oral hypoglycemic agents: Metformin. presenting blood sugar 87.    Plan: accuchecks QAC and HS with low dose sliding scale insulin.  Hold home Metformin for now.     #) Essential hypertension: Outpatient antihypertensive regimen consists of  losartan and HCTZ.  Presenting systolic blood pressure noted to be in the 140s to 160s mmHg, in the report absence of today's doses of outpatient hypertensive medications.  Plan: Resume home losartan, with next dose to occur now.  We will hold home HCTZ for now.  Close monitoring of ensuing blood pressures via routine vital signs.     #) GERD: The patient reports a history of such, although she is not currently on any PPI or H2 blocker as an outpatient.  May consider dose of IV Protonix or GI cocktail depending upon nature of any additional episodes of chest pain, but will refrain from as needed dosing of such at this time and focus on evaluation management associated with presenting ACS rule out, as above.  Plan: Consider as needed H2 blocker.      DVT prophylaxis: SCDs Code Status: Full code Family Communication: none Disposition Plan: Per Rounding Team Consults called: none  Admission status: Observation; med telemetry.    Of note, this patient was added by me to the following Admit List/Treatment Team:  mcadmits     PLEASE NOTE THAT DRAGON DICTATION SOFTWARE WAS USED IN THE CONSTRUCTION OF THIS NOTE.   Rhetta Mura DO [facility] Pager [phone] From [place]  10/09/2020, 7:54 PM

## 2020-10-09 NOTE — ED Triage Notes (Signed)
Pt c/o discomfort to chest and L arm since last night.

## 2020-10-09 NOTE — ED Notes (Signed)
Waiting on [facility] for transfer

## 2020-10-09 NOTE — ED Provider Notes (Signed)
[facility] EMERGENCY DEPARTMENT Provider Note   CSN: [id number] Arrival date & time: 10/09/20  1237     History Chief Complaint  Patient presents with   Chest Pain    Debbie Velasquez is a 69 y.o. female.  HPI  HPI: A 69 year old patient with a history of treated diabetes, hypertension and obesity presents for evaluation of chest pain. Initial onset of pain was more than 6 hours ago. The patient's chest pain is described as heaviness/pressure/tightness and is not worse with exertion. The patient complains of nausea. The patient's chest pain is middle- or left-sided, is not well-localized, is not sharp and does radiate to the arms/jaw/neck. The patient denies diaphoresis. The patient has no history of stroke, has no history of peripheral artery disease, has not smoked in the past 90 days, has no relevant family history of coronary artery disease (first degree relative at less than age 70) and has no history of hypercholesterolemia. Pt is a 69 y/o female with a h/o arthritis, left renal carcinoma s/p partial nephrectomy, GERD, nephrolithiasis, hypertension, pneumonia, diabetes, who presents to the ED today for eval of chest pain that started last night but it was worse this morning. She describes the pain as a heaviness. This morning when she woke up the discomfort lasted for about 30 minutes. States she had some associated pain in the left arm. Her sxs have since resolved. She attributed the arm pain to her arthritis and the chest pain to gas. She had noodles last night for dinner. Reports associated lightheadedness, nauseated (has current renal stone and this started prior to chest pain). Denies associated diaphoresis, shortness of breath, vomiting.  She went to urgent care PTA and was sent here for further eval.   Past Medical History:  Diagnosis Date   Arthritis    Cancer ([place])    Left renal mass   GERD (gastroesophageal reflux disease)    History of kidney stones     x3 -remains with one on right.    Hx of seasonal allergies    rare flare ups   Hypertension    Kidney stones    Left renal mass    Obesity    Pneumonia    hx of 20 years ago   Right ureteral stone    Type 2 diabetes mellitus ([place])    Type 2   Wears glasses    Wears partial dentures    upper    Patient Active Problem List   Diagnosis Date Noted   Chest pain 10/09/2020   Facet degeneration of lumbar region 12/18/2018   Chronic low back pain without sciatica 06/18/2017   Primary osteoarthritis of right knee 03/26/2017   Presence of left artificial knee joint 02/26/2017   Unilateral primary osteoarthritis, left knee 01/12/2017   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/12/2017   Renal mass 12/22/2015    Past Surgical History:  Procedure Laterality Date   CESAREAN SECTION  yrs ago   [place], URETEROSCOPY AND STENT PLACEMENT Left 05/24/2018   Procedure: CYSTOSCOPY WITH RETROGRADE PYELOGRAM, URETEROSCOPY AND STENT PLACEMENT;  Surgeon: Alexis Frock, MD;  Location: [facility];  Service: Urology;  Laterality: Left;   CYSTOSCOPY WITH RETROGRADE PYELOGRAM, URETEROSCOPY AND STENT PLACEMENT Left 02/27/2020   Procedure: CYSTOSCOPY WITH RETROGRADE PYELOGRAM, URETEROSCOPY AND STENT PLACEMENT;  Surgeon: Alexis Frock, MD;  Location: [facility];  Service: Urology;  Laterality: Left;  1 HR   HOLMIUM LASER APPLICATION Left 03/24/2296   Procedure: HOLMIUM LASER APPLICATION;  Surgeon: Alexis Frock, MD;  Location: [facility];  Service: Urology;  Laterality: Left;   HOLMIUM LASER APPLICATION Left 11/26/2681   Procedure: HOLMIUM LASER APPLICATION;  Surgeon: Alexis Frock, MD;  Location: [facility];  Service: Urology;  Laterality: Left;   ROBOTIC ASSITED PARTIAL NEPHRECTOMY Left 12/22/2015   Procedure: XI ROBOTIC ASSITED PARTIAL NEPHRECTOMY;  Surgeon: Alexis Frock, MD;  Location: [facility];  Service: Urology;  Laterality: Left;   TOTAL ABDOMINAL HYSTERECTOMY  W/ BILATERAL SALPINGOOPHORECTOMY  1980's   TOTAL KNEE ARTHROPLASTY Left 01/12/2017   Procedure: LEFT TOTAL KNEE ARTHROPLASTY and Right knee steroid injection;  Surgeon: Mcarthur Rossetti, MD;  Location: [facility];  Service: Orthopedics;  Laterality: Left;     OB History   No obstetric history on file.     Family History  Problem Relation Age of Onset   Esophageal cancer Maternal Aunt    Colon cancer Neg Hx    Stomach cancer Neg Hx    Rectal cancer Neg Hx     Social History   Tobacco Use   Smoking status: Never Smoker   Smokeless tobacco: Never Used  Vaping Use   Vaping Use: Never used  Substance Use Topics   Alcohol use: No    Alcohol/week: 0.0 standard drinks   Drug use: No    Home Medications Prior to Admission medications   Medication Sig Start Date End Date Taking? Authorizing Provider  acetaminophen-codeine (TYLENOL #3) 300-30 MG tablet Take 1-2 tablets by mouth every 8 (eight) hours as needed for moderate pain. 07/22/20  Yes Mcarthur Rossetti, MD  losartan-hydrochlorothiazide (HYZAAR) 100-25 MG tablet Take 1 tablet by mouth daily. 03/16/20  Yes [provider]  metFORMIN (GLUCOPHAGE) 500 MG tablet Take 500 mg by mouth daily with breakfast.    Yes [provider]  Vitamin D, Ergocalciferol, (DRISDOL) 50000 units CAPS capsule Take 50,000 Units by mouth every 7 (seven) days. Thursday 08/06/17  Yes [provider]  Alum Hydroxide-Mag Trisilicate 41-96.2 MG CHEW Chew 2 tablets by mouth daily as needed (indigestion).    [provider]  aspirin 81 MG chewable tablet Chew 1 tablet (81 mg total) by mouth 2 (two) times daily. Patient taking differently: Chew 81 mg by mouth daily. 01/15/17   Mcarthur Rossetti, MD  HYDROcodone-acetaminophen (NORCO/VICODIN) 5-325 MG tablet Take 1 tablet by mouth every 6 (six) hours as needed for severe pain. 10/07/20   Caccavale, Sophia, PA-C  Multiple Vitamins-Minerals (MULTIVITAMIN WITH  MINERALS) tablet Take 2 tablets by mouth daily. Vita fusion Chewable    [provider]    Allergies    Patient has no known allergies.  Review of Systems   Review of Systems  Constitutional: Negative for fever.  HENT: Negative for ear pain and sore throat.   Eyes: Negative for visual disturbance.  Respiratory: Negative for cough and shortness of breath.   Cardiovascular: Positive for chest pain.  Gastrointestinal: Positive for nausea. Negative for abdominal pain, constipation, diarrhea and vomiting.  Genitourinary: Negative for dysuria and hematuria.  Musculoskeletal: Negative for back pain.  Skin: Negative for rash.  Neurological: Negative for headaches.  All other systems reviewed and are negative.   Physical Exam Updated Vital Signs BP (!) 161/61    Pulse 71    Temp 98.2 F (36.8 C) (Oral)    Resp 19    Ht 5\' 4"  (1.626 m)    Wt 119 kg    SpO2 100%    BMI 45.05 kg/m   Physical Exam  Vitals and nursing note reviewed.  Constitutional:      General: She is not in acute distress.    Appearance: She is well-developed and well-nourished.  HENT:     Head: Normocephalic and atraumatic.  Eyes:     Conjunctiva/sclera: Conjunctivae normal.  Cardiovascular:     Rate and Rhythm: Normal rate and regular rhythm.     Heart sounds: Normal heart sounds. No murmur heard.   Pulmonary:     Effort: Pulmonary effort is normal. No respiratory distress.     Breath sounds: Normal breath sounds. No decreased breath sounds, wheezing, rhonchi or rales.  Abdominal:     Palpations: Abdomen is soft.     Tenderness: There is no abdominal tenderness.  Musculoskeletal:        General: No edema.     Cervical back: Neck supple.     Right lower leg: No tenderness. No edema.     Left lower leg: No tenderness. No edema.  Skin:    General: Skin is warm and dry.  Neurological:     Mental Status: She is alert.  Psychiatric:        Mood and Affect: Mood and affect normal.     ED Results /  Procedures / Treatments   Labs (all labs ordered are listed, but only abnormal results are displayed) Labs Reviewed  BASIC METABOLIC PANEL - Abnormal; Notable for the following components:      Result Value   BUN 28 (*)    Creatinine, Ser 1.22 (*)    GFR, Estimated 47 (*)    All other components within normal limits  CBC - Abnormal; Notable for the following components:   RBC 3.80 (*)    Hemoglobin 10.3 (*)    HCT 33.6 (*)    All other components within normal limits  RESP PANEL BY RT-PCR (FLU A&B, COVID) ARPGX2  TROPONIN I (HIGH SENSITIVITY)  TROPONIN I (HIGH SENSITIVITY)    EKG EKG Interpretation  Date/Time:  Saturday October 09 2020 12:38:58 EST Ventricular Rate:  71 PR Interval:  210 QRS Duration: 78 QT Interval:  366 QTC Calculation: 397 R Axis:   38 Text Interpretation: Sinus rhythm with 1st degree A-V block Otherwise normal ECG Confirmed by Merrily Pew [phone]) on 10/09/2020 12:45:50 PM   Radiology DG Chest Portable 1 View  Result Date: 10/09/2020 CLINICAL DATA:  Chest pressure. EXAM: PORTABLE CHEST 1 VIEW COMPARISON:  November 17, 2017 FINDINGS: There is stable blunting of the left costophrenic angle. The heart size is unremarkable. There is no pneumothorax. No focal infiltrate. No acute osseous abnormality. IMPRESSION: No active disease. Electronically Signed   By: Constance Holster M.D.   On: 10/09/2020 15:31    Procedures Procedures (including critical care time)  Medications Ordered in ED Medications  hydrALAZINE (APRESOLINE) tablet 25 mg (has no administration in time range)    ED Course  I have reviewed the triage vital signs and the nursing notes.  Pertinent labs & imaging results that were available during my care of the patient were reviewed by me and considered in my medical decision making (see chart for details).    MDM Rules/Calculators/A&P HEAR Score: 87                        69 y/o F presenting for eval of CP.   Reviewed/interpreted  labs CBC with mild anemia which is stable,  otherwise unremarkable BMP with renal dysfunction that is stable, otherwise unremarkable Trops  are negative x2  EKG - Sinus rhythm with 1st degree A-V block Otherwise normal ECG   CXR reviewed/interpreted -  No active disease.   Pt with and episode of chest pain. She has a heart score of 6 so is high risk. She has never had a stress test or any other provocative testing. Given her elevated heart score she is felt to be high risk for ACS. Discussed admission versus discharge with pt and she opted for admission. I feel this is reasonable given her overall risk and that she is not currently established with a cardiologist.   4:33 PM CONSULT with Dr. Roosevelt Locks who accepts patient for admission   Final Clinical Impression(s) / ED Diagnoses Final diagnoses:  Chest pain, unspecified type    Rx / DC Orders ED Discharge Orders    None       Rodney Booze, PA-C 10/09/20 1756    Malvin Johns, MD 10/09/20 2311

## 2020-10-09 NOTE — Progress Notes (Signed)
   10/09/20 1950  Vitals  Temp 98.8 F (37.1 C)  Temp Source Oral  BP (!) 154/75  MAP (mmHg) 94  BP Location Left Arm  BP Method Automatic  Patient Position (if appropriate) Sitting  Pulse Rate 71  Pulse Rate Source Monitor  Resp 18  Level of Consciousness  Level of Consciousness Alert  MEWS COLOR  MEWS Score Color Green  Oxygen Therapy  SpO2 100 %  O2 Device Room Air  Pain Assessment  Pain Scale 0-10  Pain Score 0  MEWS Score  MEWS Temp 0  MEWS Systolic 0  MEWS Pulse 0  MEWS RR 0  MEWS LOC 0  MEWS Score 0  Admitted pt to rm 3E05 from [facility] via [facility], pt is alert and oriented, denies chest pain at this time, oriented to room, call bell placed within reach. Hospitalist on call made aware.

## 2020-10-10 ENCOUNTER — Other Ambulatory Visit (HOSPITAL_COMMUNITY): Payer: Medicare PPO

## 2020-10-10 ENCOUNTER — Observation Stay (HOSPITAL_BASED_OUTPATIENT_CLINIC_OR_DEPARTMENT_OTHER): Payer: Medicare PPO

## 2020-10-10 DIAGNOSIS — E119 Type 2 diabetes mellitus without complications: Secondary | ICD-10-CM

## 2020-10-10 DIAGNOSIS — K219 Gastro-esophageal reflux disease without esophagitis: Secondary | ICD-10-CM

## 2020-10-10 DIAGNOSIS — R079 Chest pain, unspecified: Secondary | ICD-10-CM

## 2020-10-10 DIAGNOSIS — I10 Essential (primary) hypertension: Secondary | ICD-10-CM

## 2020-10-10 LAB — BASIC METABOLIC PANEL
Anion gap: 10 (ref 5–15)
BUN: 27 mg/dL — ABNORMAL HIGH (ref 8–23)
CO2: 24 mmol/L (ref 22–32)
Calcium: 8.8 mg/dL — ABNORMAL LOW (ref 8.9–10.3)
Chloride: 105 mmol/L (ref 98–111)
Creatinine, Ser: 1.14 mg/dL — ABNORMAL HIGH (ref 0.44–1.00)
GFR, Estimated: 51 mL/min — ABNORMAL LOW (ref >60)
Glucose, Bld: 88 mg/dL (ref 70–99)
Potassium: 3.8 mmol/L (ref 3.5–5.1)
Sodium: 139 mmol/L (ref 135–145)

## 2020-10-10 LAB — ECHOCARDIOGRAM COMPLETE
Area-P 1/2: 3.37 cm2
Calc EF: 65 %
Height: 64 in
S' Lateral: 2.1 cm
Single Plane A2C EF: 56.1 %
Single Plane A4C EF: 74.5 %
Weight: 4113.6 oz

## 2020-10-10 LAB — CBC
HCT: 29 % — ABNORMAL LOW (ref 36.0–46.0)
Hemoglobin: 8.6 g/dL — ABNORMAL LOW (ref 12.0–15.0)
MCH: 26.3 pg (ref 26.0–34.0)
MCHC: 29.7 g/dL — ABNORMAL LOW (ref 30.0–36.0)
MCV: 88.7 fL (ref 80.0–100.0)
Platelets: 297 10*3/uL (ref 150–400)
RBC: 3.27 MIL/uL — ABNORMAL LOW (ref 3.87–5.11)
RDW: 13.9 % (ref 11.5–15.5)
WBC: 9 10*3/uL (ref 4.0–10.5)
nRBC: 0 % (ref 0.0–0.2)

## 2020-10-10 LAB — GLUCOSE, CAPILLARY
Glucose-Capillary: 89 mg/dL (ref 70–99)
Glucose-Capillary: 85 mg/dL (ref 70–99)

## 2020-10-10 LAB — TROPONIN I (HIGH SENSITIVITY): Troponin I (High Sensitivity): 4 ng/L (ref <18)

## 2020-10-10 LAB — LIPASE, BLOOD: Lipase: 49 U/L (ref 11–51)

## 2020-10-10 LAB — MAGNESIUM: Magnesium: 1.7 mg/dL (ref 1.7–2.4)

## 2020-10-10 MED ORDER — PANTOPRAZOLE SODIUM 40 MG PO TBEC: 40.0000 mg | DELAYED_RELEASE_TABLET | Freq: Every day | ORAL | 1 refills | Status: DC

## 2020-10-10 MED ORDER — TAMSULOSIN HCL 0.4 MG PO CAPS: 0.8000 mg | ORAL_CAPSULE | Freq: Every day | ORAL | 0 refills | Status: DC

## 2020-10-10 MED ORDER — TAMSULOSIN HCL 0.4 MG PO CAPS
0.8000 mg | ORAL_CAPSULE | Freq: Every day | ORAL | Status: DC
  Administered 2020-10-10: 16:00:00 0.8 mg via ORAL
  Filled 2020-10-10: qty 2

## 2020-10-10 NOTE — Discharge Summary (Addendum)
Physician Discharge Summary  Debbie Velasquez DVV:[id number] DOB: Jun 23, 1951 DOA: 10/09/2020  PCP: Nolene Ebbs, MD  Admit date: 10/09/2020 Discharge date: 10/10/2020  Admitted From: Home Disposition:  Home  Recommendations for Outpatient Follow-up:  1. Follow up with PCP in 1-2 weeks 2. Please obtain BMP/CBC in one week   Home Health:No Equipment/Devices:None  Discharge Condition:Stable CODE STATUS:Full Diet recommendation: Heart Healthy   Brief/Interim Summary: 69 year old female with past medical history of diabetes mellitus type 2, essential hypertension GERD left renal cell carcinoma status post partial left nephrectomy in 2017 who was admitted from [facility] after she started having chest pain after a large fatty meal.  She relates her pain is not made worse by exertion or better by rest is started 30 minutes after she finished her meal while she was sitting down.  She had accompanied with this chest pain and some nausea.  Discharge Diagnoses:  Atypical chest pain With atypical chest pain, with a twelve-lead EKG showed normal sinus rhythm with no T wave abnormalities normal axis and normal interval, 3 sets of cardiac biomarkers are negative x3.  Her pain likely sounds GERD in nature, will start on Protonix at home. However she does have multiple risk factors with a heart score 4, I have discussed with cardiology and we will do a stress test as an outpatient. Will home on aspirin.  GERD/nausea Early related to her meal, will start her on Protonix daily 4 weeks.  Essential hypertension No changes made to her medication continue current regimen.  Type 2 diabetes mellitus ([place]) Relatively well controlled here in the hospital without any insulin on her home she is only on Metformin 500 mg daily.  We will continue current regimen no changes made. Discharge Instructions  Discharge Instructions    Diet - low sodium heart healthy   Complete by: As  directed    Increase activity slowly   Complete by: As directed      Allergies as of 10/10/2020   No Known Allergies     Medication List    TAKE these medications   acetaminophen-codeine 300-30 MG tablet Commonly known as: TYLENOL #3 Take 1-2 tablets by mouth every 8 (eight) hours as needed for moderate pain.   aspirin 81 MG chewable tablet Chew 1 tablet (81 mg total) by mouth 2 (two) times daily. What changed: when to take this   HYDROcodone-acetaminophen 5-325 MG tablet Commonly known as: NORCO/VICODIN Take 1 tablet by mouth every 6 (six) hours as needed for severe pain.   losartan-hydrochlorothiazide 100-25 MG tablet Commonly known as: HYZAAR Take 1 tablet by mouth daily.   metFORMIN 500 MG tablet Commonly known as: GLUCOPHAGE Take 500 mg by mouth daily with breakfast.   pantoprazole 40 MG tablet Commonly known as: Protonix Take 1 tablet (40 mg total) by mouth daily.   tamsulosin 0.4 MG Caps capsule Commonly known as: FLOMAX Take 2 capsules (0.8 mg total) by mouth daily after breakfast.   Vitamin D (Ergocalciferol) 1.25 MG (50000 UNIT) Caps capsule Commonly known as: DRISDOL Take 50,000 Units by mouth every Thursday.       No Known Allergies  Consultations: None  Procedures/Studies: DG Chest Portable 1 View  Result Date: 10/09/2020 CLINICAL DATA:  Chest pressure. EXAM: PORTABLE CHEST 1 VIEW COMPARISON:  November 17, 2017 FINDINGS: There is stable blunting of the left costophrenic angle. The heart size is unremarkable. There is no pneumothorax. No focal infiltrate. No acute osseous abnormality. IMPRESSION: No active disease. Electronically  Signed   By: Constance Holster M.D.   On: 10/09/2020 15:31   ECHOCARDIOGRAM COMPLETE  Result Date: 10/10/2020    ECHOCARDIOGRAM REPORT   Patient Name:   Debbie Velasquez Date of Exam: 10/10/2020 Medical Rec #:  [id number]             Height:       64.0 in Accession #:    [id number]            Weight:       257.1  lb Date of Birth:  1951/03/10             BSA:          2.177 m Patient Age:    69 years              BP:           132/62 mmHg Patient Gender: F                     HR:           80 bpm. Exam Location:  Inpatient Procedure: 2D Echo, 3D Echo, Cardiac Doppler and Color Doppler Indications:    R07.9* Chest pain, unspecified  History:        Patient has no prior history of Echocardiogram examinations.                 Signs/Symptoms:Chest Pain; Risk Factors:Diabetes and                 Hypertension. History of cancer.  Sonographer:    Roseanna Rainbow RDCS Referring Phys: [id number] Rhetta Mura  Sonographer Comments: Patient is morbidly obese. Image acquisition challenging due to patient body habitus. IMPRESSIONS  1. Left ventricular ejection fraction, by estimation, is 65 to 70%. The left ventricle has normal function. The left ventricle has no regional wall motion abnormalities. There is moderate left ventricular hypertrophy most notably of the basal-septal LV segment. Left ventricular diastolic parameters are consistent with Grade I diastolic dysfunction (impaired relaxation).  2. Right ventricular systolic function is normal. The right ventricular size is normal.  3. The mitral valve is normal in structure. Trivial mitral valve regurgitation.  4. The aortic valve is tricuspid. Aortic valve regurgitation is not visualized. No aortic stenosis is present.  5. The inferior vena cava is normal in size with <50% respiratory variability, suggesting right atrial pressure of 8 mmHg. Comparison(s): No prior Echocardiogram. FINDINGS  Left Ventricle: Left ventricular ejection fraction, by estimation, is 65 to 70%. The left ventricle has normal function. The left ventricle has no regional wall motion abnormalities. The left ventricular internal cavity size was normal in size. There is  moderate left ventricular hypertrophy of the basal-septal segment. Left ventricular diastolic parameters are consistent with Grade I diastolic  dysfunction (impaired relaxation). Right Ventricle: The right ventricular size is normal. No increase in right ventricular wall thickness. Right ventricular systolic function is normal. Left Atrium: Left atrial size was normal in size. Right Atrium: Right atrial size was normal in size. Pericardium: There is no evidence of pericardial effusion. Mitral Valve: The mitral valve is normal in structure. There is mild thickening of the mitral valve leaflet(s). There is mild calcification of the mitral valve leaflet(s). Mild mitral annular calcification. Trivial mitral valve regurgitation. Tricuspid Valve: The tricuspid valve is normal in structure. Tricuspid valve regurgitation is trivial. Aortic Valve: The aortic valve is tricuspid. Aortic valve regurgitation is not visualized. No aortic stenosis is  present. Pulmonic Valve: The pulmonic valve was normal in structure. Pulmonic valve regurgitation is trivial. Aorta: The aortic root and ascending aorta are structurally normal, with no evidence of dilitation. Venous: The inferior vena cava is normal in size with less than 50% respiratory variability, suggesting right atrial pressure of 8 mmHg. IAS/Shunts: No atrial level shunt detected by color flow Doppler.  LEFT VENTRICLE PLAX 2D LVIDd:         3.80 cm     Diastology LVIDs:         2.10 cm     LV e' medial:    8.70 cm/s LV PW:         1.40 cm     LV E/e' medial:  11.6 LV IVS:        1.40 cm     LV e' lateral:   13.80 cm/s LVOT diam:     1.90 cm     LV E/e' lateral: 7.3 LV SV:         79 LV SV Index:   36 LVOT Area:     2.84 cm  LV Volumes (MOD) LV vol d, MOD A2C: 70.8 ml LV vol d, MOD A4C: 88.0 ml LV vol s, MOD A2C: 31.1 ml LV vol s, MOD A4C: 22.4 ml LV SV MOD A2C:     39.7 ml LV SV MOD A4C:     88.0 ml LV SV MOD BP:      51.9 ml IVC IVC diam: 2.00 cm LEFT ATRIUM             Index       RIGHT ATRIUM           Index LA diam:        4.00 cm 1.84 cm/m  RA Area:     13.90 cm LA Vol (A2C):   51.9 ml 23.84 ml/m RA Volume:    33.70 ml  15.48 ml/m LA Vol (A4C):   32.9 ml 15.11 ml/m LA Biplane Vol: 41.6 ml 19.11 ml/m  AORTIC VALVE LVOT Vmax:   146.00 cm/s LVOT Vmean:  100.000 cm/s LVOT VTI:    0.278 m  AORTA Ao Root diam: 3.20 cm Ao Asc diam:  3.20 cm MITRAL VALVE MV Area (PHT): 3.37 cm     SHUNTS MV Decel Time: 225 msec     Systemic VTI:  0.28 m MV E velocity: 101.00 cm/s  Systemic Diam: 1.90 cm MV A velocity: 107.00 cm/s MV E/A ratio:  0.94 Gwyndolyn Kaufman MD Electronically signed by Gwyndolyn Kaufman MD Signature Date/Time: 10/10/2020/2:26:40 PM    Final    CT Renal Stone Study  Result Date: 10/07/2020 CLINICAL DATA:  69 year old female with flank and abdominal pain. Back pain, hematuria, abnormal urine. EXAM: CT ABDOMEN AND PELVIS WITHOUT CONTRAST TECHNIQUE: Multidetector CT imaging of the abdomen and pelvis was performed following the standard protocol without IV contrast. COMPARISON:  Noncontrast CT Abdomen and Pelvis 02/22/2020. FINDINGS: Lower chest: Cardiac size at the upper limits of normal. Otherwise negative. Hepatobiliary: Negative noncontrast liver and gallbladder. Pancreas: Negative. Spleen: Negative. Adrenals/Urinary Tract: Normal adrenal glands. Chronic bilateral nephrolithiasis. Developing right lower pole staghorn calculus, elongated and up to 2 cm now (coronal image 58). No right hydronephrosis or perinephric stranding. Obstructed and inflamed left kidney with moderate to severe hydronephrosis and moderate perinephric stranding. Left hydroureter and periureteral stranding continuing into the pelvis. And at the left ureterovesical junction and elongated 9 mm obstructing calculus or adjacent stones is present (coronal image  77). Furthermore, there is also a punctate calculus in the upstream distal ureter about 2 cm proximal from the large stone (coronal image 81). Only mild residual left lower pole nephrolithiasis now compared to May. Diminutive and unremarkable urinary bladder. Stomach/Bowel: Decompressed  large bowel. Mild diverticulosis in the sigmoid. Diminutive, normal appendix. Negative terminal ileum. No dilated small bowel. Possible small gastric hiatal hernia but otherwise negative stomach. No free air, free fluid. Vascular/Lymphatic: Normal caliber abdominal aorta. Mild calcified atherosclerosis. Vascular patency is not evaluated in the absence of IV contrast. Reproductive: Surgically absent uterus. Diminutive or absent ovaries. Other: No pelvic free fluid. Musculoskeletal: Widespread lumbar facet arthropathy. Advanced bilateral hip joint degeneration. No acute osseous abnormality identified. IMPRESSION: 1. Severe obstructive uropathy on the left with possible forniceal rupture and at least two distal left ureteral calculi, the larger up to 9 mm at the left UVJ. 2. Chronic bilateral nephrolithiasis with 20 mm developing right lower pole staghorn calculus. 3. 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 Electronically Signed   By: Genevie Ann M.D.   On: 10/07/2020 11:07    Subjective: No new complaints.  Discharge Exam: Vitals:   10/10/20 0420 10/10/20 0900  BP: (!) 138/57 132/62  Pulse: 64 69  Resp: 18 16  Temp: 98.1 F (36.7 C) 98.7 F (37.1 C)  SpO2: 100% 100%   Vitals:   10/09/20 1950 10/10/20 0020 10/10/20 0420 10/10/20 0900  BP: (!) 154/75 (!) 126/48 (!) 138/57 132/62  Pulse: 71 76 64 69  Resp: 18 18 18 16   Temp: 98.8 F (37.1 C) 98.1 F (36.7 C) 98.1 F (36.7 C) 98.7 F (37.1 C)  TempSrc: Oral Oral Oral Oral  SpO2: 100% 100% 100% 100%  Weight:   116.6 kg   Height:        General: Pt is alert, awake, not in acute distress Cardiovascular: RRR, S1/S2 +, no rubs, no gallops Respiratory: CTA bilaterally, no wheezing, no rhonchi Abdominal: Soft, NT, ND, bowel sounds + Extremities: no edema, no cyanosis    The results of significant diagnostics from this hospitalization (including imaging, microbiology, ancillary and laboratory) are listed below for reference.      Microbiology: Recent Results (from the past 240 hour(s))  Resp Panel by RT-PCR (Flu A&B, Covid) Nasopharyngeal Swab     Status: None   Collection Time: 10/09/20  4:19 PM   Specimen: Nasopharyngeal Swab; Nasopharyngeal(NP) swabs in vial transport medium  Result Value Ref Range Status   SARS Coronavirus 2 by RT PCR NEGATIVE NEGATIVE Final    Comment: (NOTE) SARS-CoV-2 target nucleic acids are NOT DETECTED.  The SARS-CoV-2 RNA is generally detectable in upper respiratory specimens during the acute phase of infection. The lowest concentration of SARS-CoV-2 viral copies this assay can detect is 138 copies/mL. A negative result does not preclude SARS-Cov-2 infection and should not be used as the sole basis for treatment or other patient management decisions. A negative result may occur with  improper specimen collection/handling, submission of specimen other than nasopharyngeal swab, presence of viral mutation(s) within the areas targeted by this assay, and inadequate number of viral copies(<138 copies/mL). A negative result must be combined with clinical observations, patient history, and epidemiological information. The expected result is Negative.  Fact Sheet for Patients:  EntrepreneurPulse.com.au  Fact Sheet for Healthcare Providers:  IncredibleEmployment.be  This test is no t yet approved or cleared by the [place] FDA and  has been authorized for detection and/or diagnosis of SARS-CoV-2 by FDA under an  Emergency Use Authorization (EUA). This EUA will remain  in effect (meaning this test can be used) for the duration of the COVID-19 declaration under Section 564(b)(1) of the Act, 21 U.S.C.section 360bbb-3(b)(1), unless the authorization is terminated  or revoked sooner.       Influenza A by PCR NEGATIVE NEGATIVE Final   Influenza B by PCR NEGATIVE NEGATIVE Final    Comment: (NOTE) The Xpert Xpress SARS-CoV-2/FLU/RSV plus assay is  intended as an aid in the diagnosis of influenza from Nasopharyngeal swab specimens and should not be used as a sole basis for treatment. Nasal washings and aspirates are unacceptable for Xpert Xpress SARS-CoV-2/FLU/RSV testing.  Fact Sheet for Patients: EntrepreneurPulse.com.au  Fact Sheet for Healthcare Providers: IncredibleEmployment.be  This test is not yet approved or cleared by the [place] FDA and has been authorized for detection and/or diagnosis of SARS-CoV-2 by FDA under an Emergency Use Authorization (EUA). This EUA will remain in effect (meaning this test can be used) for the duration of the COVID-19 declaration under Section 564(b)(1) of the Act, 21 U.S.C. section 360bbb-3(b)(1), unless the authorization is terminated or revoked.  Performed at [facility], [address] [place], [place] [address]      Labs: BNP (last 3 results) No results for input(s): BNP in the last 8760 hours. Basic Metabolic Panel: Recent Labs  Lab 10/07/20 1015 10/09/20 1256 10/10/20 0238  NA 136 138 139  K 3.8 4.0 3.8  CL 102 102 105  CO2 23 25 24   GLUCOSE 110* 87 88  BUN 26* 28* 27*  CREATININE 1.25* 1.22* 1.14*  CALCIUM 9.3 9.2 8.8*  MG  --   --  1.7   Liver Function Tests: Recent Labs  Lab 10/07/20 1015  AST 16  ALT 10  ALKPHOS 72  BILITOT 0.5  PROT 7.8  ALBUMIN 3.7   Recent Labs  Lab 10/07/20 1015 10/10/20 0238  LIPASE 56* 49   No results for input(s): AMMONIA in the last 168 hours. CBC: Recent Labs  Lab 10/07/20 1015 10/09/20 1256 10/10/20 0238  WBC 11.5* 10.4 9.0  HGB 10.0* 10.3* 8.6*  HCT 32.3* 33.6* 29.0*  MCV 88.7 88.4 88.7  PLT 349 397 297   Cardiac Enzymes: No results for input(s): CKTOTAL, CKMB, CKMBINDEX, TROPONINI in the last 168 hours. BNP: Invalid input(s): POCBNP CBG: Recent Labs  Lab 10/09/20 2144 10/10/20 0608 10/10/20 1150  GLUCAP 82 89 85   D-Dimer No results for  input(s): DDIMER in the last 72 hours. Hgb A1c No results for input(s): HGBA1C in the last 72 hours. Lipid Profile No results for input(s): CHOL, HDL, LDLCALC, TRIG, CHOLHDL, LDLDIRECT in the last 72 hours. Thyroid function studies No results for input(s): TSH, T4TOTAL, T3FREE, THYROIDAB in the last 72 hours.  Invalid input(s): FREET3 Anemia work up No results for input(s): VITAMINB12, FOLATE, FERRITIN, TIBC, IRON, RETICCTPCT in the last 72 hours. Urinalysis    Component Value Date/Time   COLORURINE YELLOW 10/07/2020 1015   APPEARANCEUR CLEAR 10/07/2020 1015   LABSPEC 1.015 10/07/2020 1015   PHURINE 5.0 10/07/2020 1015   GLUCOSEU NEGATIVE 10/07/2020 1015   HGBUR NEGATIVE 10/07/2020 1015   BILIRUBINUR NEGATIVE 10/07/2020 1015   KETONESUR NEGATIVE 10/07/2020 1015   PROTEINUR NEGATIVE 10/07/2020 1015   UROBILINOGEN 0.2 08/13/2015 0318   NITRITE NEGATIVE 10/07/2020 1015   LEUKOCYTESUR NEGATIVE 10/07/2020 1015   Sepsis Labs Invalid input(s): PROCALCITONIN,  WBC,  LACTICIDVEN Microbiology Recent Results (from the past 240 hour(s))  Resp Panel  by RT-PCR (Flu A&B, Covid) Nasopharyngeal Swab     Status: None   Collection Time: 10/09/20  4:19 PM   Specimen: Nasopharyngeal Swab; Nasopharyngeal(NP) swabs in vial transport medium  Result Value Ref Range Status   SARS Coronavirus 2 by RT PCR NEGATIVE NEGATIVE Final    Comment: (NOTE) SARS-CoV-2 target nucleic acids are NOT DETECTED.  The SARS-CoV-2 RNA is generally detectable in upper respiratory specimens during the acute phase of infection. The lowest concentration of SARS-CoV-2 viral copies this assay can detect is 138 copies/mL. A negative result does not preclude SARS-Cov-2 infection and should not be used as the sole basis for treatment or other patient management decisions. A negative result may occur with  improper specimen collection/handling, submission of specimen other than nasopharyngeal swab, presence of viral  mutation(s) within the areas targeted by this assay, and inadequate number of viral copies(<138 copies/mL). A negative result must be combined with clinical observations, patient history, and epidemiological information. The expected result is Negative.  Fact Sheet for Patients:  EntrepreneurPulse.com.au  Fact Sheet for Healthcare Providers:  IncredibleEmployment.be  This test is no t yet approved or cleared by the [place] FDA and  has been authorized for detection and/or diagnosis of SARS-CoV-2 by FDA under an Emergency Use Authorization (EUA). This EUA will remain  in effect (meaning this test can be used) for the duration of the COVID-19 declaration under Section 564(b)(1) of the Act, 21 U.S.C.section 360bbb-3(b)(1), unless the authorization is terminated  or revoked sooner.       Influenza A by PCR NEGATIVE NEGATIVE Final   Influenza B by PCR NEGATIVE NEGATIVE Final    Comment: (NOTE) The Xpert Xpress SARS-CoV-2/FLU/RSV plus assay is intended as an aid in the diagnosis of influenza from Nasopharyngeal swab specimens and should not be used as a sole basis for treatment. Nasal washings and aspirates are unacceptable for Xpert Xpress SARS-CoV-2/FLU/RSV testing.  Fact Sheet for Patients: EntrepreneurPulse.com.au  Fact Sheet for Healthcare Providers: IncredibleEmployment.be  This test is not yet approved or cleared by the [place] FDA and has been authorized for detection and/or diagnosis of SARS-CoV-2 by FDA under an Emergency Use Authorization (EUA). This EUA will remain in effect (meaning this test can be used) for the duration of the COVID-19 declaration under Section 564(b)(1) of the Act, 21 U.S.C. section 360bbb-3(b)(1), unless the authorization is terminated or revoked.  Performed at [facility], [address] [place], [place] [address]      Time coordinating  discharge: Over 30 minutes  SIGNED:   Charlynne Cousins, MD  [facility] 10/10/2020, 3:03 PM Pager   If 7PM-7AM, please contact night-coverage [email or web address] Password [email or web address]

## 2020-10-10 NOTE — Progress Notes (Signed)
  Echocardiogram 2D Echocardiogram has been performed.  Bobbye Charleston 10/10/2020, 1:33 PM

## 2020-10-10 NOTE — Plan of Care (Signed)

## 2020-10-12 ENCOUNTER — Encounter (HOSPITAL_COMMUNITY): Payer: Self-pay | Admitting: Physician Assistant

## 2020-10-12 ENCOUNTER — Telehealth: Payer: Self-pay | Admitting: Cardiology

## 2020-10-12 ENCOUNTER — Other Ambulatory Visit: Payer: Self-pay | Admitting: Urology

## 2020-10-12 NOTE — Progress Notes (Signed)
Pt case just added on today for tomorrow 10-13-2020.  Reviewed pt chart for pre-op interview.  Noted pt was admitted on 10-09-2020 for chest pain, stayed overnight. Discharged 10-10-2020 told to have cardiology hospital follow up for nuclear stress test .  Reviewed chart w/ anesthesia, Konrad Felix PA,  Stated pt needs cardiology input/ clearance before proceeding with this procedure.  Called and left message for [place], [place] scheduler for dr Tresa Moore, informed her of this.

## 2020-10-12 NOTE — Telephone Encounter (Signed)
   [facility] Pre-operative Risk Assessment    Request for surgical clearance:  1. What type of surgery is being performed? Ureteroscopy   2. When is this surgery scheduled? 10/13/2020 @ 9 AM  3. What type of clearance is required (medical clearance vs. Pharmacy clearance to hold med vs. Both)? Medical   4. Are there any medications that need to be held prior to surgery and how long? None   5. Practice name and name of physician performing surgery? [facility] & Dr. Tresa Moore  6. What is your office phone number [phone] ext [phone]   7.   What is your office fax number [phone]  8.   Anesthesia type (None, local, MAC, general) ? General    Debbie Velasquez 10/12/2020, 12:18 PM  _________________________________________________________________   (provider comments below)  Patient was seen at [facility] over the weekend and had echo done while here

## 2020-10-12 NOTE — Telephone Encounter (Signed)
Primary Cardiologist:No primary care provider on file.  Chart reviewed as part of pre-operative protocol coverage. Because of Debbie Velasquez's past medical history and time since last visit, he/she will require a follow-up visit in order to better assess preoperative cardiovascular risk.  Pre-op covering staff: - Please schedule appointment and call patient to inform them. - Please contact requesting surgeon's office via preferred method (i.e, phone, fax) to inform them of need for appointment prior to surgery.  If applicable, this message will also be routed to pharmacy pool and/or primary cardiologist for input on holding anticoagulant/antiplatelet agent as requested below so that this information is available at time of patient's appointment.   [name], NP  10/12/2020, 12:32 PM

## 2020-10-13 ENCOUNTER — Encounter (HOSPITAL_BASED_OUTPATIENT_CLINIC_OR_DEPARTMENT_OTHER): Admission: RE | Payer: Self-pay | Source: Home / Self Care

## 2020-10-13 ENCOUNTER — Ambulatory Visit (HOSPITAL_BASED_OUTPATIENT_CLINIC_OR_DEPARTMENT_OTHER): Admission: RE | Admit: 2020-10-13 | Payer: Medicare PPO | Source: Home / Self Care | Admitting: Urology

## 2020-10-13 SURGERY — CYSTOURETEROSCOPY, WITH RETROGRADE PYELOGRAM AND STENT INSERTION
Anesthesia: General | Laterality: Bilateral

## 2020-10-13 NOTE — Progress Notes (Signed)
Cardiology Office Note:   Date:  10/14/2020  NAME:  ATIANA LEVIER    MRN: [id number] DOB:  08/05/1951   PCP:  Nolene Ebbs, MD  Cardiologist:  No primary care provider on file.   Referring MD: Alexis Frock, MD   Chief Complaint  Patient presents with  . Chest Pain   History of Present Illness:   MIRABELLE CYPHERS is a 69 y.o. female with a hx of DM, HTN, GERD, CKD3, who is being seen today for the evaluation of chest pain at the request of Nolene Ebbs, MD. Seen in ER 10/09/2020 for chest pain. Troponin negative x 2 and EKG normal. Symptoms consistent with GERD. Echo normal. Sent for evaluation. She reports she woke up on 10/09/2020 with a tightness in her chest.  She reports it felt heavy.  She reports exercise that make it worse.  She reports no triggering factors.  She does suffer from gas.  She reports passing gas to help the pain.  The pain lasted several hours and resolved.  She was evaluated emergency room with a normal EKG and negative troponins.  She had no further recurrence of chest pain.  She reports she does suffer from gas and she does have some tightness and bloating symptoms in the improve with a gas pill.  She reports no exertional chest pain or shortness of breath.  She can climb a flight of stairs without any significant angina.  Her EKG shows normal sinus rhythm with likely LVH by voltage.  CVD risk factors include hypertension, diabetes that is well controlled and CKD.  Her most recent creatinine is 1.  1 4.  Most recent A1c 6.2.  Her most recent hemoglobin is 8.6.  She is a never smoker.  She does not drink alcohol or use drugs.  No strong family history of heart disease.  Problem List 1. Diabetes -A1c 6.2 2. HTN 3. Renal cell carcinoma s/p L nephrectomy 2017 4. GERD 5. CKD 3 -GFR 51  Past Medical History: Past Medical History:  Diagnosis Date  . Arthritis   . Cancer ([place])    Left renal mass  . GERD (gastroesophageal reflux disease)   .  History of kidney stones    x3 -remains with one on right.   Marland Kitchen Hx of seasonal allergies    rare flare ups  . Hypertension   . Kidney stones   . Left renal mass   . Obesity   . Pneumonia    hx of 20 years ago  . Right ureteral stone   . Type 2 diabetes mellitus (HCC)    Type 2  . Wears glasses   . Wears partial dentures    upper    Past Surgical History: Past Surgical History:  Procedure Laterality Date  . CESAREAN SECTION  yrs ago  . CYSTOSCOPY WITH RETROGRADE PYELOGRAM, URETEROSCOPY AND STENT PLACEMENT Left 05/24/2018   Procedure: CYSTOSCOPY WITH RETROGRADE PYELOGRAM, URETEROSCOPY AND STENT PLACEMENT;  Surgeon: Alexis Frock, MD;  Location: [facility];  Service: Urology;  Laterality: Left;  . CYSTOSCOPY WITH RETROGRADE PYELOGRAM, URETEROSCOPY AND STENT PLACEMENT Left 02/27/2020   Procedure: CYSTOSCOPY WITH RETROGRADE PYELOGRAM, URETEROSCOPY AND STENT PLACEMENT;  Surgeon: Alexis Frock, MD;  [facility];  Service: Urology;  Laterality: Left;  1 HR  . HOLMIUM LASER APPLICATION Left 11/23/3084   Procedure: HOLMIUM LASER APPLICATION;  Surgeon: Alexis Frock, MD;  [facility];  Service: Urology;  Laterality: Left;  . HOLMIUM LASER APPLICATION Left 02/27/8468   Procedure:  HOLMIUM LASER APPLICATION;  Surgeon: Alexis Frock, MD;  [facility];  Service: Urology;  Laterality: Left;  . ROBOTIC ASSITED PARTIAL NEPHRECTOMY Left 12/22/2015   Procedure: XI ROBOTIC ASSITED PARTIAL NEPHRECTOMY;  Surgeon: Alexis Frock, MD;  [facility];  Service: Urology;  Laterality: Left;  . TOTAL ABDOMINAL HYSTERECTOMY W/ BILATERAL SALPINGOOPHORECTOMY  1980's  . TOTAL KNEE ARTHROPLASTY Left 01/12/2017   Procedure: LEFT TOTAL KNEE ARTHROPLASTY and Right knee steroid injection;  Surgeon: Mcarthur Rossetti, MD;  Location[facility];  Service: Orthopedics;  Laterality: Left;    Current Medications: Current Meds  Medication Sig  . acetaminophen-codeine (TYLENOL #3) 300-30 MG tablet Take 1-2  tablets by mouth every 8 (eight) hours as needed for moderate pain.  Marland Kitchen aspirin 81 MG chewable tablet Chew 1 tablet (81 mg total) by mouth 2 (two) times daily. (Patient taking differently: Chew 81 mg by mouth daily.)  . HYDROcodone-acetaminophen (NORCO/VICODIN) 5-325 MG tablet Take 1 tablet by mouth every 6 (six) hours as needed for severe pain.  Marland Kitchen losartan-hydrochlorothiazide (HYZAAR) 100-25 MG tablet Take 1 tablet by mouth daily.  . metFORMIN (GLUCOPHAGE) 500 MG tablet Take 500 mg by mouth daily with breakfast.   . pantoprazole (PROTONIX) 40 MG tablet Take 1 tablet (40 mg total) by mouth daily.  . tamsulosin (FLOMAX) 0.4 MG CAPS capsule Take 2 capsules (0.8 mg total) by mouth daily after breakfast.  . Vitamin D, Ergocalciferol, (DRISDOL) 50000 units CAPS capsule Take 50,000 Units by mouth every Thursday.     Allergies:    Patient has no known allergies.   Social History: Social History   Socioeconomic History  . Marital status: Married    Spouse name: Not on file  . Number of children: Not on file  . Years of education: Not on file  . Highest education level: Not on file  Occupational History  . Occupation: retired  Tobacco Use  . Smoking status: Never Smoker  . Smokeless tobacco: Never Used  Vaping Use  . Vaping Use: Never used  Substance and Sexual Activity  . Alcohol use: No    Alcohol/week: 0.0 standard drinks  . Drug use: No  . Sexual activity: Not Currently  Other Topics Concern  . Not on file  Social History Narrative  . Not on file   Social Determinants of Health   Financial Resource Strain: Not on file  Food Insecurity: Not on file  Transportation Needs: Not on file  Physical Activity: Not on file  Stress: Not on file  Social Connections: Not on file     Family History: The patient's family history includes Esophageal cancer in her maternal aunt. There is no history of Colon cancer, Stomach cancer, or Rectal cancer.  ROS:   All other ROS reviewed and  negative. Pertinent positives noted in the HPI.     EKGs/Labs/Other Studies Reviewed:   The following studies were personally reviewed by me today:  EKG:  EKG is ordered today.  The ekg ordered today demonstrates normal sinus rhythm, heart rate 79, LVH by voltage, and was personally reviewed by me.   TTE 10/10/2020 1. Left ventricular ejection fraction, by estimation, is 65 to 70%. The  left ventricle has normal function. The left ventricle has no regional  wall motion abnormalities. There is moderate left ventricular hypertrophy  most notably of the basal-septal LV  segment. Left ventricular diastolic parameters are consistent with Grade I  diastolic dysfunction (impaired relaxation).  2. Right ventricular systolic function is normal. The right  ventricular  size is normal.  3. The mitral valve is normal in structure. Trivial mitral valve  regurgitation.  4. The aortic valve is tricuspid. Aortic valve regurgitation is not  visualized. No aortic stenosis is present.  5. The inferior vena cava is normal in size with <50% respiratory  variability, suggesting right atrial pressure of 8 mmHg.   Recent Labs: 10/07/2020: ALT 10 10/10/2020: BUN 27; Creatinine, Ser 1.14; Hemoglobin 8.6; Magnesium 1.7; Platelets 297; Potassium 3.8; Sodium 139   Recent Lipid Panel No results found for: CHOL, TRIG, HDL, CHOLHDL, VLDL, LDLCALC, LDLDIRECT  Physical Exam:   VS:  BP (!) 141/77   Pulse 79   Ht 5\' 4"  (1.626 m)   Wt 256 lb 3.2 oz (116.2 kg)   SpO2 100%   BMI 43.98 kg/m    Wt Readings from Last 3 Encounters:  10/14/20 256 lb 3.2 oz (116.2 kg)  10/10/20 257 lb 1.6 oz (116.6 kg)  10/07/20 255 lb (115.7 kg)    General: Well nourished, well developed, in no acute distress Head: Atraumatic, normal size  Eyes: PEERLA, EOMI  Neck: Supple, no JVD Endocrine: No thryomegaly Cardiac: Normal S1, S2; RRR; no murmurs, rubs, or gallops Lungs: Clear to auscultation bilaterally, no wheezing, rhonchi  or rales  Abd: Soft, nontender, no hepatomegaly  Ext: No edema, pulses 2+ Musculoskeletal: No deformities, BUE and BLE strength normal and equal Skin: Warm and dry, no rashes   Neuro: Alert and oriented to person, place, time, and situation, CNII-XII grossly intact, no focal deficits  Psych: Normal mood and affect   ASSESSMENT:   JENEEN COLAO is a 69 y.o. female who presents for the following: 1. Chest pain, unspecified type   2. Obesity, morbid, BMI 40.0-49.9 ([place])   3. Preoperative cardiovascular examination     PLAN:   1. Chest pain, unspecified type -Atypical chest pain.  Appears to be related to gas.  Symptoms improved with passing gas.  Troponins were negative.  EKG is normal.  No further recurrence of symptoms.  She has had symptoms like this in the past and they improve with passing gas.  I see no need for further cardiac testing.  This is clearly gas related.  2. Obesity, morbid, BMI 40.0-49.9 ([place]) -Diet and exercise encouraged.  3. Preoperative cardiovascular examination -RCRI equals 0 which equates to a 0.4% risk of major perioperative cardiovascular event.  I recommend no further cardiovascular testing.  She can, flight of stairs which is greater than 4 METS without any symptoms of chest pain or shortness of breath.  Her cardiovascular examination is normal.  There are no murmurs.  Her EKG is normal.  Disposition: Return if symptoms worsen or fail to improve.  Medication Adjustments/Labs and Tests Ordered: Current medicines are reviewed at length with the patient today.  Concerns regarding medicines are outlined above.  Orders Placed This Encounter  Procedures  . EKG 12-Lead   No orders of the defined types were placed in this encounter.   Patient Instructions  Medication Instructions:  The current medical regimen is effective;  continue present plan and medications.  *If you need a refill on your cardiac medications before your next appointment, please  call your pharmacy*  Follow-Up: At [facility], you and your health needs are our priority.  As part of our continuing mission to provide you with exceptional heart care, we have created designated Provider Care Teams.  These Care Teams include your primary Cardiologist (physician) and Advanced Practice Providers (APPs -  Physician Assistants and Nurse Practitioners) who all work together to provide you with the care you need, when you need it.  We recommend signing up for the patient portal called "MyChart".  Sign up information is provided on this After Visit Summary.  MyChart is used to connect with patients for Virtual Visits (Telemedicine).  Patients are able to view lab/test results, encounter notes, upcoming appointments, etc.  Non-urgent messages can be sent to your provider as well.   To learn more about what you can do with MyChart, go to [email or web address].    Your next appointment:   As needed  The format for your next appointment:   In Person  Provider:   Eleonore Chiquito, MD       Signed, Addison Naegeli. Audie Box, [place]  [address] [place], [place] [address] [phone]  10/14/2020 11:33 AM

## 2020-10-13 NOTE — Telephone Encounter (Signed)
Pt has Dr Audie Box appt 12-23 will add cardiac clearance to appt notes.

## 2020-10-14 ENCOUNTER — Ambulatory Visit (INDEPENDENT_AMBULATORY_CARE_PROVIDER_SITE_OTHER): Payer: Medicare PPO | Admitting: Cardiovascular Disease

## 2020-10-14 ENCOUNTER — Encounter: Payer: Self-pay | Admitting: Cardiovascular Disease

## 2020-10-14 ENCOUNTER — Other Ambulatory Visit: Payer: Self-pay

## 2020-10-14 VITALS — BP 141/77 | HR 79 | Ht 64.0 in | Wt 256.2 lb

## 2020-10-14 DIAGNOSIS — Z01810 Encounter for preprocedural cardiovascular examination: Secondary | ICD-10-CM | POA: Diagnosis not present

## 2020-10-14 DIAGNOSIS — R079 Chest pain, unspecified: Secondary | ICD-10-CM | POA: Diagnosis not present

## 2020-10-14 NOTE — Patient Instructions (Signed)
Medication Instructions:  The current medical regimen is effective;  continue present plan and medications.  *If you need a refill on your cardiac medications before your next appointment, please call your pharmacy*    Follow-Up: At [facility], you and your health needs are our priority.  As part of our continuing mission to provide you with exceptional heart care, we have created designated Provider Care Teams.  These Care Teams include your primary Cardiologist (physician) and Advanced Practice Providers (APPs -  Physician Assistants and Nurse Practitioners) who all work together to provide you with the care you need, when you need it.  We recommend signing up for the patient portal called "MyChart".  Sign up information is provided on this After Visit Summary.  MyChart is used to connect with patients for Virtual Visits (Telemedicine).  Patients are able to view lab/test results, encounter notes, upcoming appointments, etc.  Non-urgent messages can be sent to your provider as well.   To learn more about what you can do with MyChart, go to [email or web address].    Your next appointment:   As needed  The format for your next appointment:   In Person  Provider:   [place] O'Neal, MD      

## 2020-10-14 NOTE — Telephone Encounter (Signed)
° ° ° °  Debbie Velasquez with [facility] calling back to follow up clearance

## 2020-10-14 NOTE — Telephone Encounter (Signed)
   Primary Cardiologist: Evalina Field, MD  Chart reviewed as part of pre-operative protocol coverage. Given past medical history and time since last visit, based on ACC/AHA guidelines, Debbie Velasquez would be at acceptable risk for the planned procedure without further cardiovascular testing.   I will route this recommendation to the requesting party via Epic fax function and remove from pre-op pool.  Please call with questions.  Jossie Ng. Vincenzo Stave NP-C    10/14/2020, 1:30 PM [place] [address] Office [phone] Fax [phone]

## 2020-10-18 ENCOUNTER — Other Ambulatory Visit: Payer: Self-pay

## 2020-10-18 ENCOUNTER — Encounter (HOSPITAL_COMMUNITY): Payer: Self-pay | Admitting: Urology

## 2020-10-18 ENCOUNTER — Other Ambulatory Visit (HOSPITAL_COMMUNITY)
Admission: RE | Admit: 2020-10-18 | Discharge: 2020-10-18 | Disposition: A | Discharge status: Home / Self Care | Payer: Medicare PPO | Source: Ambulatory Visit | Attending: Urology | Admitting: Urology

## 2020-10-18 DIAGNOSIS — Z20822 Contact with and (suspected) exposure to covid-19: Secondary | ICD-10-CM | POA: Insufficient documentation

## 2020-10-18 DIAGNOSIS — Z01812 Encounter for preprocedural laboratory examination: Secondary | ICD-10-CM | POA: Diagnosis present

## 2020-10-18 NOTE — Anesthesia Preprocedure Evaluation (Addendum)
Anesthesia Evaluation  Patient identified by MRN, date of birth, ID band Patient awake    Reviewed: Allergy & Precautions, NPO status , Patient's Chart, lab work & pertinent test results  History of Anesthesia Complications Negative for: history of anesthetic complications  Airway Mallampati: II  TM Distance: >3 FB Neck ROM: Full    Dental  (+) Partial Upper   Pulmonary neg pulmonary ROS,    Pulmonary exam normal        Cardiovascular hypertension, Pt. on medications Normal cardiovascular exam     Neuro/Psych negative neurological ROS  negative psych ROS   GI/Hepatic Neg liver ROS, GERD  Medicated and Controlled,  Endo/Other  diabetes, Type 2, Oral Hypoglycemic AgentsMorbid obesity  Renal/GU Renal InsufficiencyRenal diseaseRenal ca  negative genitourinary   Musculoskeletal  (+) Arthritis ,   Abdominal   Peds  Hematology  (+) anemia , Hgb 8.6   Anesthesia Other Findings Day of surgery medications reviewed with patient.  Reproductive/Obstetrics negative OB ROS                          Anesthesia Physical Anesthesia Plan  ASA: III  Anesthesia Plan: General   Post-op Pain Management:    Induction:   PONV Risk Score and Plan: 3 and Treatment may vary due to age or medical condition, Dexamethasone and Ondansetron  Airway Management Planned: LMA  Additional Equipment: None  Intra-op Plan:   Post-operative Plan: Extubation in OR  Informed Consent: I have reviewed the patients History and Physical, chart, labs and discussed the procedure including the risks, benefits and alternatives for the proposed anesthesia with the patient or authorized representative who has indicated his/her understanding and acceptance.     Dental advisory given  Plan Discussed with: CRNA  Anesthesia Plan Comments: (See PAT note 10/18/2020, Jodell Cipro, PA-C)     Anesthesia Quick Evaluation

## 2020-10-18 NOTE — Progress Notes (Signed)
Anesthesia Chart Review   Case: [id number] Date/Time: 10/20/20 1445   Procedures:      CYSTOSCOPY WITH RETROGRADE PYELOGRAM, URETEROSCOPY AND STENT PLACEMENT (Bilateral ) - 75 MINS     HOLMIUM LASER APPLICATION (Bilateral )   Anesthesia type: General   Pre-op diagnosis: BILATERAL RENAL AND URETERAL STONES   [facility]   Surgeons: Sebastian Ache, MD      DISCUSSION:69 y.o. never smoker with h/o HTN, DM II, GERD, bilateral renal and ureteral stones scheduled for above procedure 10/20/2020 with Dr. Sebastian Ache.   Pt seen by cardiology 10/14/20. Per OV note, "-RCRI equals 0 which equates to a 0.4% risk of major perioperative cardiovascular event.  I recommend no further cardiovascular testing.  She can, flight of stairs which is greater than 4 METS without any symptoms of chest pain or shortness of breath.  Her cardiovascular examination is normal.  There are no murmurs.  Her EKG is normal."  Anticipate pt can proceed with planned procedure barring acute status change.   VS: There were no vitals taken for this visit.  PROVIDERS: Fleet Contras, MD is PCP   Sande Rives, MD is Cardiologist  LABS: Hemoglobin 8.6, forwarded to PCP and surgeon (all labs ordered are listed, but only abnormal results are displayed)  Labs Reviewed - No data to display   IMAGES:   EKG: 10/14/2020 Rate 79 bpm  NSR Minimal voltage criteria for LVH, may be normal variant   CV: Echo 10/10/2020 IMPRESSIONS    1. Left ventricular ejection fraction, by estimation, is 65 to 70%. The  left ventricle has normal function. The left ventricle has no regional  wall motion abnormalities. There is moderate left ventricular hypertrophy  most notably of the basal-septal LV  segment. Left ventricular diastolic parameters are consistent with Grade I  diastolic dysfunction (impaired relaxation).  2. Right ventricular systolic function is normal. The right ventricular  size is  normal.  3. The mitral valve is normal in structure. Trivial mitral valve  regurgitation.  4. The aortic valve is tricuspid. Aortic valve regurgitation is not  visualized. No aortic stenosis is present.  5. The inferior vena cava is normal in size with <50% respiratory  variability, suggesting right atrial pressure of 8 mmHg.  Past Medical History:  Diagnosis Date  . Anemia   . Arthritis   . Cancer of left kidney ([facility])    Left renal mass  . GERD (gastroesophageal reflux disease)   . History of kidney stones    x3 -remains with one on right.   Marland Kitchen Hx of seasonal allergies    rare flare ups  . Hypertension   . Left renal mass   . Obesity   . Pneumonia    hx of 20 years ago  . Right ureteral stone   . Type 2 diabetes mellitus (HCC)    Type 2  . Wears glasses   . Wears partial dentures    upper    Past Surgical History:  Procedure Laterality Date  . CESAREAN SECTION  yrs ago  . CYSTOSCOPY WITH RETROGRADE PYELOGRAM, URETEROSCOPY AND STENT PLACEMENT Left 05/24/2018   Procedure: CYSTOSCOPY WITH RETROGRADE PYELOGRAM, URETEROSCOPY AND STENT PLACEMENT;  Surgeon: Sebastian Ache, MD;  [facility];  Service: Urology;  Laterality: Left;  . CYSTOSCOPY WITH RETROGRADE PYELOGRAM, URETEROSCOPY AND STENT PLACEMENT Left 02/27/2020   Procedure: CYSTOSCOPY WITH RETROGRADE PYELOGRAM, URETEROSCOPY AND STENT PLACEMENT;  Surgeon: Sebastian Ache, MD;  [facility];  Service:  Urology;  Laterality: Left;  1 HR  . HOLMIUM LASER APPLICATION Left 05/31/1693   Procedure: HOLMIUM LASER APPLICATION;  Surgeon: Alexis Frock, MD;  [facility];  Service: Urology;  Laterality: Left;  . HOLMIUM LASER APPLICATION Left 5/0/3888   Procedure: HOLMIUM LASER APPLICATION;  Surgeon: Alexis Frock, MD;  [facility];  Service: Urology;  Laterality: Left;  . ROBOTIC ASSITED PARTIAL NEPHRECTOMY Left 12/22/2015   Procedure: XI ROBOTIC ASSITED PARTIAL NEPHRECTOMY;  Surgeon: Alexis Frock, MD;  [facility];  Service: Urology;  Laterality: Left;  . TOTAL ABDOMINAL HYSTERECTOMY W/ BILATERAL SALPINGOOPHORECTOMY  1980's  . TOTAL KNEE ARTHROPLASTY Left 01/12/2017   Procedure: LEFT TOTAL KNEE ARTHROPLASTY and Right knee steroid injection;  Surgeon: Mcarthur Rossetti, MD;  [facility];  Service: Orthopedics;  Laterality: Left;    MEDICATIONS: . acetaminophen-codeine (TYLENOL #3) 300-30 MG tablet  . aspirin 81 MG chewable tablet  . HYDROcodone-acetaminophen (NORCO/VICODIN) 5-325 MG tablet  . losartan-hydrochlorothiazide (HYZAAR) 100-25 MG tablet  . metFORMIN (GLUCOPHAGE) 500 MG tablet  . pantoprazole (PROTONIX) 40 MG tablet  . tamsulosin (FLOMAX) 0.4 MG CAPS capsule  . Vitamin D, Ergocalciferol, (DRISDOL) 50000 units CAPS capsule   No current facility-administered medications for this encounter.    Konrad Felix, PA-C [facility] [phone]

## 2020-10-18 NOTE — Progress Notes (Addendum)
COVID Vaccine Completed: YES Date COVID Vaccine completed: X2 plus booster COVID vaccine manufacturer:     Moderna    PCP - Fleet Contras, MD Cardiologist - Lennie Odor MD cardiac clearance and last office visit note 10/14/20 in epic and on chart.  Chest x-ray - 10/09/20 in epic EKG - 10/14/20 in epic Stress Test - N/A ECHO - 10/10/20 in epic Cardiac Cath - N/A Pacemaker/ICD device last checked: N/A  Sleep Study - N/A CPAP - N/A  Fasting Blood Sugar - 80's Checks Blood Sugar __2-3___ times a week   Blood Thinner Instructions: N/A Aspirin Instructions: Remain on aspirin per Dr. Berneice Heinrich Last Dose: N/A  Activity level: Able to exercise without symptoms     Anesthesia review: ED for recent CP, cardiac clearance in epic and on chart.  Patient denies shortness of breath, fever, cough and chest pain at PAT appointment   Patient verbalized understanding of instructions that were given to them at the PAT appointment. Patient was also instructed that they will need to review over the PAT instructions again at home before surgery.

## 2020-10-19 LAB — SARS CORONAVIRUS 2 (TAT 6-24 HRS): SARS Coronavirus 2: NEGATIVE

## 2020-10-20 ENCOUNTER — Ambulatory Visit (HOSPITAL_COMMUNITY): Payer: Medicare PPO | Admitting: Certified Registered"

## 2020-10-20 ENCOUNTER — Encounter (HOSPITAL_COMMUNITY)
Admission: RE | Disposition: A | Discharge status: Home / Self Care | Payer: Self-pay | Source: Home / Self Care | Attending: Urology

## 2020-10-20 ENCOUNTER — Ambulatory Visit (HOSPITAL_COMMUNITY): Payer: Medicare PPO

## 2020-10-20 ENCOUNTER — Ambulatory Visit (HOSPITAL_COMMUNITY): Payer: Medicare PPO | Admitting: Physician Assistant

## 2020-10-20 ENCOUNTER — Ambulatory Visit (HOSPITAL_COMMUNITY)
Admission: RE | Admit: 2020-10-20 | Discharge: 2020-10-20 | Disposition: A | Discharge status: Home / Self Care | Payer: Medicare PPO | Attending: Urology | Admitting: Urology

## 2020-10-20 ENCOUNTER — Encounter (HOSPITAL_COMMUNITY): Payer: Self-pay | Admitting: Urology

## 2020-10-20 DIAGNOSIS — Z6841 Body Mass Index (BMI) 40.0 and over, adult: Secondary | ICD-10-CM | POA: Insufficient documentation

## 2020-10-20 DIAGNOSIS — Z7984 Long term (current) use of oral hypoglycemic drugs: Secondary | ICD-10-CM | POA: Insufficient documentation

## 2020-10-20 DIAGNOSIS — E119 Type 2 diabetes mellitus without complications: Secondary | ICD-10-CM | POA: Diagnosis not present

## 2020-10-20 DIAGNOSIS — Z905 Acquired absence of kidney: Secondary | ICD-10-CM | POA: Insufficient documentation

## 2020-10-20 DIAGNOSIS — N202 Calculus of kidney with calculus of ureter: Secondary | ICD-10-CM | POA: Diagnosis present

## 2020-10-20 DIAGNOSIS — Z85528 Personal history of other malignant neoplasm of kidney: Secondary | ICD-10-CM | POA: Diagnosis not present

## 2020-10-20 DIAGNOSIS — Z7982 Long term (current) use of aspirin: Secondary | ICD-10-CM | POA: Diagnosis not present

## 2020-10-20 DIAGNOSIS — Z79899 Other long term (current) drug therapy: Secondary | ICD-10-CM | POA: Diagnosis not present

## 2020-10-20 DIAGNOSIS — Z87442 Personal history of urinary calculi: Secondary | ICD-10-CM | POA: Diagnosis not present

## 2020-10-20 HISTORY — DX: Malignant neoplasm of left kidney, except renal pelvis: C64.2

## 2020-10-20 HISTORY — PX: CYSTOSCOPY WITH RETROGRADE PYELOGRAM, URETEROSCOPY AND STENT PLACEMENT: SHX5789

## 2020-10-20 HISTORY — PX: HOLMIUM LASER APPLICATION: SHX5852

## 2020-10-20 HISTORY — DX: Anemia, unspecified: D64.9

## 2020-10-20 LAB — BASIC METABOLIC PANEL
Anion gap: 13 (ref 5–15)
BUN: 21 mg/dL (ref 8–23)
CO2: 17 mmol/L — ABNORMAL LOW (ref 22–32)
Calcium: 8.8 mg/dL — ABNORMAL LOW (ref 8.9–10.3)
Chloride: 108 mmol/L (ref 98–111)
Creatinine, Ser: 1.14 mg/dL — ABNORMAL HIGH (ref 0.44–1.00)
GFR, Estimated: 51 mL/min — ABNORMAL LOW (ref >60)
Glucose, Bld: 86 mg/dL (ref 70–99)
Potassium: 5.4 mmol/L — ABNORMAL HIGH (ref 3.5–5.1)
Sodium: 138 mmol/L (ref 135–145)

## 2020-10-20 LAB — GLUCOSE, CAPILLARY
Glucose-Capillary: 85 mg/dL (ref 70–99)
Glucose-Capillary: 79 mg/dL (ref 70–99)

## 2020-10-20 LAB — HEMOGLOBIN A1C
Hgb A1c MFr Bld: 5.6 % (ref 4.8–5.6)
Mean Plasma Glucose: 114.02 mg/dL

## 2020-10-20 SURGERY — CYSTOURETEROSCOPY, WITH RETROGRADE PYELOGRAM AND STENT INSERTION
Anesthesia: General | Laterality: Bilateral

## 2020-10-20 MED ORDER — FENTANYL CITRATE (PF) 100 MCG/2ML IJ SOLN
INTRAMUSCULAR | Status: DC | PRN
  Administered 2020-10-20 (×4): 25 ug via INTRAVENOUS

## 2020-10-20 MED ORDER — ONDANSETRON HCL 4 MG/2ML IJ SOLN
INTRAMUSCULAR | Status: DC | PRN
  Administered 2020-10-20: 4 mg via INTRAVENOUS

## 2020-10-20 MED ORDER — ACETAMINOPHEN 10 MG/ML IV SOLN: 1000.0000 mg | Freq: Once | INTRAVENOUS | Status: DC | PRN

## 2020-10-20 MED ORDER — KETOROLAC TROMETHAMINE 10 MG PO TABS: 10.0000 mg | ORAL_TABLET | Freq: Three times a day (TID) | ORAL | 0 refills | Status: DC | PRN

## 2020-10-20 MED ORDER — OXYCODONE HCL 5 MG PO TABS: 5.0000 mg | ORAL_TABLET | Freq: Once | ORAL | Status: DC | PRN

## 2020-10-20 MED ORDER — CEPHALEXIN 500 MG PO CAPS: 500.0000 mg | ORAL_CAPSULE | Freq: Two times a day (BID) | ORAL | 0 refills | Status: DC

## 2020-10-20 MED ORDER — PROMETHAZINE HCL 25 MG/ML IJ SOLN: 6.2500 mg | INTRAMUSCULAR | Status: DC | PRN

## 2020-10-20 MED ORDER — SODIUM CHLORIDE 0.9 % IR SOLN
Status: DC | PRN
  Administered 2020-10-20: 3000 mL

## 2020-10-20 MED ORDER — GENTAMICIN SULFATE 40 MG/ML IJ SOLN
5.0000 mg/kg | INTRAVENOUS | Status: AC
  Administered 2020-10-20: 14:00:00 580 mg via INTRAVENOUS
  Filled 2020-10-20: qty 14.5

## 2020-10-20 MED ORDER — OXYCODONE-ACETAMINOPHEN 5-325 MG PO TABS: 1.0000 | ORAL_TABLET | ORAL | 0 refills | Status: AC | PRN

## 2020-10-20 MED ORDER — DEXAMETHASONE SODIUM PHOSPHATE 10 MG/ML IJ SOLN
INTRAMUSCULAR | Status: DC | PRN
  Administered 2020-10-20: 4 mg via INTRAVENOUS

## 2020-10-20 MED ORDER — LACTATED RINGERS IV SOLN: INTRAVENOUS | Status: DC

## 2020-10-20 MED ORDER — ONDANSETRON HCL 4 MG/2ML IJ SOLN
INTRAMUSCULAR | Status: AC
  Filled 2020-10-20: qty 2

## 2020-10-20 MED ORDER — PROPOFOL 10 MG/ML IV BOLUS
INTRAVENOUS | Status: AC
  Filled 2020-10-20: qty 20

## 2020-10-20 MED ORDER — LIDOCAINE HCL (PF) 2 % IJ SOLN
INTRAMUSCULAR | Status: AC
  Filled 2020-10-20: qty 5

## 2020-10-20 MED ORDER — CHLORHEXIDINE GLUCONATE 0.12 % MT SOLN: 15.0000 mL | Freq: Once | OROMUCOSAL | Status: DC

## 2020-10-20 MED ORDER — ORAL CARE MOUTH RINSE: 15.0000 mL | Freq: Once | OROMUCOSAL | Status: DC

## 2020-10-20 MED ORDER — DEXAMETHASONE SODIUM PHOSPHATE 10 MG/ML IJ SOLN
INTRAMUSCULAR | Status: AC
  Filled 2020-10-20: qty 1

## 2020-10-20 MED ORDER — OXYCODONE HCL 5 MG/5ML PO SOLN: 5.0000 mg | Freq: Once | ORAL | Status: DC | PRN

## 2020-10-20 MED ORDER — FENTANYL CITRATE (PF) 100 MCG/2ML IJ SOLN
INTRAMUSCULAR | Status: AC
  Filled 2020-10-20: qty 2

## 2020-10-20 MED ORDER — IOHEXOL 300 MG/ML  SOLN
INTRAMUSCULAR | Status: DC | PRN
  Administered 2020-10-20: 15:00:00 34 mL

## 2020-10-20 MED ORDER — PROPOFOL 10 MG/ML IV BOLUS
INTRAVENOUS | Status: DC | PRN
  Administered 2020-10-20: 200 mg via INTRAVENOUS

## 2020-10-20 MED ORDER — FENTANYL CITRATE (PF) 100 MCG/2ML IJ SOLN
25.0000 ug | INTRAMUSCULAR | Status: DC | PRN
Start: 2020-10-20 — End: 2020-10-20

## 2020-10-20 MED ORDER — LIDOCAINE 2% (20 MG/ML) 5 ML SYRINGE
INTRAMUSCULAR | Status: DC | PRN
  Administered 2020-10-20: 100 mg via INTRAVENOUS

## 2020-10-20 SURGICAL SUPPLY — 25 items
BAG URO CATCHER STRL LF (MISCELLANEOUS) ×3 IMPLANT
BASKET LASER NITINOL 1.9FR (BASKET) ×2 IMPLANT
BSKT STON RTRVL 120 1.9FR (BASKET) ×1
CATH INTERMIT  6FR 70CM (CATHETERS) ×3 IMPLANT
CLOTH BEACON ORANGE TIMEOUT ST (SAFETY) ×3 IMPLANT
EXTRACTOR STONE 1.7FRX115CM (UROLOGICAL SUPPLIES) IMPLANT
GLOVE SURG ENC TEXT LTX SZ7.5 (GLOVE) ×3 IMPLANT
GOWN STRL REUS W/TWL LRG LVL3 (GOWN DISPOSABLE) ×3 IMPLANT
GUIDEWIRE ANG ZIPWIRE 038X150 (WIRE) ×5 IMPLANT
GUIDEWIRE STR DUAL SENSOR (WIRE) ×5 IMPLANT
KIT TURNOVER KIT A (KITS) ×2 IMPLANT
LASER FIB FLEXIVA PULSE ID 365 (Laser) IMPLANT
LASER FIB FLEXIVA PULSE ID 550 (Laser) IMPLANT
LASER FIB FLEXIVA PULSE ID 910 (Laser) IMPLANT
MANIFOLD NEPTUNE II (INSTRUMENTS) ×3 IMPLANT
PACK CYSTO (CUSTOM PROCEDURE TRAY) ×3 IMPLANT
SHEATH URETERAL 12FRX28CM (UROLOGICAL SUPPLIES) ×2 IMPLANT
SHEATH URETERAL 12FRX35CM (MISCELLANEOUS) IMPLANT
STENT POLARIS 5FRX24 (STENTS) ×4 IMPLANT
TRACTIP FLEXIVA PULS ID 200XHI (Laser) IMPLANT
TRACTIP FLEXIVA PULSE ID 200 (Laser) ×3
TUBE FEEDING 8FR 16IN STR KANG (MISCELLANEOUS) ×3 IMPLANT
TUBING CONNECTING 10 (TUBING) ×2 IMPLANT
TUBING CONNECTING 10' (TUBING) ×1
TUBING UROLOGY SET (TUBING) ×3 IMPLANT

## 2020-10-20 NOTE — Discharge Instructions (Signed)
1 - You may have urinary urgency (bladder spasms) and bloody urine on / off with stent in place. This is normal.  2 - Remove tethered stents on Monday morning at home by pulling on strings, then blue-white plastic tubing, and discarding. Office is open Monday if any problems arise.   3 -Call MD or go to ER for fever >102, severe pain / nausea / vomiting not relieved by medications, or acute change in medical status  

## 2020-10-20 NOTE — Transfer of Care (Signed)
Immediate Anesthesia Transfer of Care Note  Patient: Debbie Velasquez  Procedure(s) Performed: CYSTOSCOPY WITH RETROGRADE PYELOGRAM, URETEROSCOPY AND STENT PLACEMENT (Bilateral ) HOLMIUM LASER APPLICATION (Bilateral )  Patient Location: PACU  Anesthesia Type:General  Level of Consciousness: awake, alert  and oriented  Airway & Oxygen Therapy: Patient Spontanous Breathing and Patient connected to face mask oxygen  Post-op Assessment: Report given to RN, Post -op Vital signs reviewed and stable and Patient moving all extremities X 4  Post vital signs: Reviewed and stable  Last Vitals:  Vitals Value Taken Time  BP    Temp    Pulse 66 10/20/20 1518  Resp 16 10/20/20 1518  SpO2 100 % 10/20/20 1518  Vitals shown include unvalidated device data.  Last Pain:  Vitals:   10/20/20 1327  TempSrc: Oral  PainSc:       Patients Stated Pain Goal: 3 (10/20/20 1344)  Complications: No complications documented.

## 2020-10-20 NOTE — Anesthesia Procedure Notes (Signed)
Procedure Name: LMA Insertion Date/Time: 10/20/2020 2:17 PM Performed by: Nelle Don, CRNA Pre-anesthesia Checklist: Patient identified, Emergency Drugs available, Suction available and Patient being monitored Patient Re-evaluated:Patient Re-evaluated prior to induction Oxygen Delivery Method: Circle system utilized Preoxygenation: Pre-oxygenation with 100% oxygen Induction Type: IV induction LMA: LMA inserted LMA Size: 4.0 Number of attempts: 1 Dental Injury: Teeth and Oropharynx as per pre-operative assessment

## 2020-10-20 NOTE — H&P (Signed)
Debbie Velasquez is an 69 y.o. female.    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 Pre-op staging CT and MRI w/o distant disease.    2 - Solid RIGHT Renal Mass - Rt 2.7cm posterior medial mass with avid enhancement on CT 06/2016. Mass abuts posterior edge of renal vein. 2 artery (lower accessory) / 2 vein right renovascular anatomy.   Recent Surveillance:  06/2019 - CT stable Rt mass, Lt no recurrence  02/2020 - CT stable Rt mass, Lt no recurence   3 - Recurrent Nephrolithiasis -  2016 - medical passage UVJ stone  05/2018 - left ureteroscopy to stone free for 1cm UPJ stone.  02/2020 - left ureteroscopy to stone free for 76mm UPJ stone and 1cm lower pole stone.  09/2020 - NEW left 38mm UVJ stone + 42mm distal ureteral with mod hydro and , RLP 38mm non-obstructig stone by ER CT.   PMH sig for morbid obesity, DM2 (no neuropathy), benign hyst, Rt hip and bilateral knee OA, Left knee replacement. Her PCP is Vira Agar MD. Her daughter Debbie Blossom (Cunningham) is also very involved in her care. She is avid [employer] fan.    Today "Debbie Velasquez " is seen to proceed with BILATERAL ureteroscopic stone manipulation for recurrent left ureteral, Rt renal stones. No interval fevers. C19 screen negative. Cards clearance on file. Most recent UA without significant infectious parameters.    Past Medical History:  Diagnosis Date  . Anemia   . Arthritis   . Cancer of left kidney (HCC)    Left renal mass  . GERD (gastroesophageal reflux disease)   . History of kidney stones    x3 -remains with one on right.   Marland Kitchen Hx of seasonal allergies    rare flare ups  . Hypertension   . Left renal mass   . Obesity   . Pneumonia    hx of 20 years ago  . Right ureteral stone   . Type 2 diabetes mellitus (HCC)    Type 2  . Wears glasses   . Wears  partial dentures    upper    Past Surgical History:  Procedure Laterality Date  . CESAREAN SECTION  yrs ago  . CYSTOSCOPY WITH RETROGRADE PYELOGRAM, URETEROSCOPY AND STENT PLACEMENT Left 05/24/2018   Procedure: CYSTOSCOPY WITH RETROGRADE PYELOGRAM, URETEROSCOPY AND STENT PLACEMENT;  Surgeon: Alexis Frock, MD;  Location: [facility];  Service: Urology;  Laterality: Left;  . CYSTOSCOPY WITH RETROGRADE PYELOGRAM, URETEROSCOPY AND STENT PLACEMENT Left 02/27/2020   Procedure: CYSTOSCOPY WITH RETROGRADE PYELOGRAM, URETEROSCOPY AND STENT PLACEMENT;  Surgeon: Alexis Frock, MD;  Location: [facility];  Service: Urology;  Laterality: Left;  1 HR  . HOLMIUM LASER APPLICATION Left [id number]   Procedure: HOLMIUM LASER APPLICATION;  Surgeon: Alexis Frock, MD;  Location: [facility];  Service: Urology;  Laterality: Left;  . HOLMIUM LASER APPLICATION Left [id number]   Procedure: HOLMIUM LASER APPLICATION;  Surgeon: Alexis Frock, MD;  Location: [facility];  Service: Urology;  Laterality: Left;  . ROBOTIC ASSITED PARTIAL NEPHRECTOMY Left 12/22/2015   Procedure: XI ROBOTIC ASSITED PARTIAL NEPHRECTOMY;  Surgeon: Alexis Frock, MD;  Location: [facility];  Service: Urology;  Laterality: Left;  . TOTAL ABDOMINAL HYSTERECTOMY W/ BILATERAL SALPINGOOPHORECTOMY  1980's  . TOTAL KNEE ARTHROPLASTY Left 01/12/2017   Procedure: LEFT TOTAL KNEE ARTHROPLASTY and Right knee  steroid injection;  Surgeon: Kathryne Hitch, MD;  Location: [facility];  Service: Orthopedics;  Laterality: Left;    Family History  Problem Relation Age of Onset  . Esophageal cancer Maternal Aunt   . Colon cancer Neg Hx   . Stomach cancer Neg Hx   . Rectal cancer Neg Hx    Social History:  reports that she has never smoked. She has never used smokeless tobacco. She reports that she does not drink alcohol and does not use drugs.  Allergies: No Known Allergies  Medications Prior to Admission  Medication Sig Dispense Refill  . acetaminophen-codeine (TYLENOL #3)  300-30 MG tablet Take 1-2 tablets by mouth every 8 (eight) hours as needed for moderate pain. 30 tablet 0  . aspirin 81 MG chewable tablet Chew 1 tablet (81 mg total) by mouth 2 (two) times daily. (Patient taking differently: Chew 81 mg by mouth daily.) 30 tablet 0  . HYDROcodone-acetaminophen (NORCO/VICODIN) 5-325 MG tablet Take 1 tablet by mouth every 6 (six) hours as needed for severe pain. 6 tablet 0  . losartan-hydrochlorothiazide (HYZAAR) 100-25 MG tablet Take 1 tablet by mouth daily.    . metFORMIN (GLUCOPHAGE) 500 MG tablet Take 500 mg by mouth daily with breakfast.     . pantoprazole (PROTONIX) 40 MG tablet Take 1 tablet (40 mg total) by mouth daily. (Patient taking differently: Take 40 mg by mouth as needed.) 30 tablet 1  . tamsulosin (FLOMAX) 0.4 MG CAPS capsule Take 2 capsules (0.8 mg total) by mouth daily after breakfast. 30 capsule 0  . Vitamin D, Ergocalciferol, (DRISDOL) 50000 units CAPS capsule Take 50,000 Units by mouth every Thursday.  5    No results found for this or any previous visit (from the past 48 hour(s)). No results found.  Review of Systems  Constitutional: Negative for chills and fever.  Genitourinary: Positive for flank pain.  All other systems reviewed and are negative.   Blood pressure (!) 170/79, pulse 84, temperature 98.7 F (37.1 C), temperature source Oral, resp. rate 18, height 5\' 4"  (1.626 m), weight 117 kg, SpO2 98 %. Physical Exam Vitals reviewed.  HENT:     Head: Normocephalic.     Nose: Nose normal.  Eyes:     Pupils: Pupils are equal, round, and reactive to light.  Cardiovascular:     Rate and Rhythm: Normal rate.     Pulses: Normal pulses.  Pulmonary:     Effort: Pulmonary effort is normal.  Abdominal:     Comments: Mild CVAT at present. Stable large trucal obesity.   Skin:    General: Skin is warm.  Neurological:     General: No focal deficit present.  Psychiatric:        Mood and Affect: Mood normal.       Assessment/Plan  Proceed as planned with BILATERAL ureteroscopic stone manipulation. Risks, benefits, alternatives, expected peri-op course discussed previously and reiterated today.   , MD 10/20/2020, 1:30 PM

## 2020-10-20 NOTE — Brief Op Note (Signed)
10/20/2020  3:15 PM  PATIENT:  Virgel Gess  69 y.o. female  PRE-OPERATIVE DIAGNOSIS:  BILATERAL RENAL AND URETERAL STONES  POST-OPERATIVE DIAGNOSIS:  BILATERAL RENAL AND URETERAL STONES  PROCEDURE:  Procedure(s) with comments: CYSTOSCOPY WITH RETROGRADE PYELOGRAM, URETEROSCOPY AND STENT PLACEMENT (Bilateral) - 75 MINS HOLMIUM LASER APPLICATION (Bilateral)  SURGEON:  Surgeon(s) and Role:    Sebastian Ache, MD - Primary  PHYSICIAN ASSISTANT:   ASSISTANTS: none   ANESTHESIA:   general  EBL:  minimal   BLOOD ADMINISTERED:none  DRAINS: none   LOCAL MEDICATIONS USED:  NONE  SPECIMEN:  Source of Specimen:  bilateral renal / ureteral stone fragments  DISPOSITION OF SPECIMEN:  [facility] for compositional analysis  COUNTS:  YES  TOURNIQUET:  * No tourniquets in log *  DICTATION: .Other Dictation: Dictation Number  [id number]  PLAN OF CARE: Discharge to home after PACU  PATIENT DISPOSITION:  PACU - hemodynamically stable.   Delay start of Pharmacological VTE agent (>24hrs) due to surgical blood loss or risk of bleeding: yes

## 2020-10-21 ENCOUNTER — Encounter (HOSPITAL_COMMUNITY): Payer: Self-pay | Admitting: Urology

## 2020-10-21 NOTE — Anesthesia Postprocedure Evaluation (Addendum)
Anesthesia Post Note  Patient: Debbie Velasquez  Procedure(s) Performed: CYSTOSCOPY WITH RETROGRADE PYELOGRAM, URETEROSCOPY AND STENT PLACEMENT (Bilateral ) HOLMIUM LASER APPLICATION (Bilateral )     Patient location during evaluation: PACU Anesthesia Type: General Level of consciousness: awake and alert and oriented Pain management: pain level controlled Vital Signs Assessment: post-procedure vital signs reviewed and stable Respiratory status: spontaneous breathing, nonlabored ventilation and respiratory function stable Cardiovascular status: blood pressure returned to baseline Postop Assessment: no apparent nausea or vomiting Anesthetic complications: no   No complications documented.  Last Vitals:  Vitals:   10/20/20 1530 10/20/20 1545  BP: (!) 142/70 132/67  Pulse: 63 60  Resp: 16 19  Temp:  36.4 C  SpO2: 95% 98%    Last Pain:  Vitals:   10/20/20 1545  TempSrc:   PainSc: 0-No pain   Pain Goal: Patients Stated Pain Goal: 3 (10/20/20 1344)                 Kaylyn Layer

## 2020-10-25 NOTE — Op Note (Signed)
NAME: STARLIGHT, HARRISON MEDICAL RECORD [id number] ACCOUNT [id number] DATE OF BIRTH:06-18-1951 FACILITY: [facility] LOCATION: [facility] PH[facility]ICIAN:Analese Sovine, MD  OPERATIVE REPORT  DATE OF PROCEDURE:  10/20/2020  PREOPERATIVE DIAGNOSIS:  Left ureteral and bilateral renal stones.  POSTOPERATIVE DIAGNOSIS:  Left ureteral and bilateral renal stones.  PROCEDURES: 1.  Cystoscopy, bilateral retrograde pyelograms with interpretation. 2.  Bilateral ureteroscopy with laser lithotripsy. 3.  Insertion of bilateral ureteral stents, 5 x 24 Polaris with tether.  ESTIMATED BLOOD LOSS:  Nil.  COMPLICATIONS:  None.  SPECIMEN:  Bilateral renal and ureteral stone fragments for analysis.  FINDINGS: 1.  Large left distal ureteral stone with moderate hydronephrosis. 2.  Right greater than left intrarenal stone volume, mostly very small popcorn kernel type stone.  There was complete resolution of all accessible stone fragments larger than one-third of a mm following laser lithotripsy and basket extraction. 3.  Successful placement of bilateral ureteral stents, proximal end in renal pelvis, distal end in urinary bladder with tether.  INDICATIONS:  The patient is a very pleasant, but unfortunate 70 year old woman with history of morbid obesity and recurrent urolithiasis or stone recurrence pattern.  It has actually been accelerating over the past several years.  She underwent  ureteroscopy only 7 months ago to stone free, but unfortunately redeveloped significant stone volume including a large left distal ureteral stone greater than 8 mm as well as recurrent left intrarenal stones.  Options were discussed for management, and  she wished to proceed with bilateral ureteroscopy again with a goal of stone free.  We discussed beforehand again the recommendation of repeat metabolic evaluation following this given her acceleration of stone recurrence.  She wished to proceed.   Informed consent was obtained and  placed in medical record.  DESCRIPTION OF PROCEDURE:  The patient being Distiny Zegarelli verified, the procedure being bilateral ureteroscopy with laser lithotripsy was confirmed.  Procedure timeout was performed.  Intravenous Ancef was administered.  General anesthesia was  induced.  The patient was placed into a low lithotomy position.  Sterile field was created, prepped and draped the patient's vagina, introitus, and proximal thighs using iodine.  Cystourethroscopy was performed using 21-French rigid cystoscope with  offset lens.  Inspection of the bladder revealed no diverticula, calcifications, or papillary lesions.  Ureteral orifices were singleton bilaterally.  The left ureteral orifice was cannulated with a 6-French renal catheter, and left retrograde pyelogram  was obtained.  Left retrograde pyelogram demonstrated a single left ureter, single system left kidney.  There was moderate hydronephrosis and a large filling defect in the distal ureter consistent with known stone.  A 0.03 ZIPwire was advanced to lower pole and set  aside as a safety wire.  Next, right retrograde pyelogram was obtained.  Right retrograde pyelogram demonstrated a single right ureter, single system right kidney.  No filling defects or narrowing noted.  A separate 0.03 ZIPwire was advanced to lower pole and set aside as a safety wire.  An 8-French feeding tube was placed in  the urinary bladder for pressure release, and semirigid ureteroscopy was performed of the entire length of the right ureter alongside a separate sensor working wire.  No mucosal findings were found.  Next, semirigid ureteroscopy performed of the distal  left ureter alongside a separate sensor working wire.  In the distal left ureter as expected, there was a very large ureteral stone.  This was ovoid.  It did appear, but rather soft possible uric acid composition.  This was much too large for simple  basketing.  As such, holmium laser energy applied  at a setting of 0.2 joules and 20 Hz.  Using a dusting technique, approximately 70% of the stone was dusted, 30% fragmented.  The remaining fragments were amenable to simple basketing with an escape  basket and removed and set aside for composition analysis.  Repeat semirigid ureteroscopy was performed of the remaining length of the left ureter.  No mucosal findings were found.  As the goal today was to render stone free, the semirigid scope was  exchanged for a 12/14 short length ureteral access sheath to the level of the proximal ureter.  Using continuous fluoroscopic guidance, a flexible digital ureteroscopy was performed of the proximal ureter and systematic inspection of left kidney.  There  were multifocal relatively small volume intrarenal stones noted mostly in the mid pole.  Most of these were amenable to simple basketing and escape basket was used to remove these rendering left side stone free of all accessible stone fragments larger  than one-third mm.  Access sheath was removed under continuous vision.  No mucosal masses were found.  The access sheath was then placed over the right-sided sensor working wire to the level of the right proximal ureter, and flexible digital ureteroscopy  was performed of the proximal ureter and systematic inspection of the right kidney with all calices x3.  There was multifocal significant volume, but very small individual stones, almost like popcorn kernels.  Each individual stone was estimated to be  less than 0.5 mm.  These were essentially free floating.  This volume was not amenable to basketing.  As such, a popcorn lithotripsy technique was used with holmium laser energy using settings 2 joules and 10 Hz.  After the stones were cored out with  irrigation into the upper pole calix, this resulted in significant downsizing of initial fragments such that they were less than one-third mm in diameter.  Access sheath was removed under continuous vision.  No significant  mucosal findings were found.   Given the bilateral nature of the procedure, it was felt that brief interval stenting with tethered stents will be warranted.  As such, new 5 x 24 type stents placed over remaining safety wires bilaterally using fluoroscopic guidance.  Good proximal and  distal planes were noted.  Tethers were left in place, fashioned together, trimmed to approximately 4 inches extruding from the meatus and then tucked per vagina.  The procedure was then terminated.  The patient tolerated the procedure well.  No  immediate perioperative complications.  The patient was taken to the postanesthesia care unit in stable condition.  Plan for discharge home.  IN/NUANCE  D:10/20/2020 T:10/21/2020 JOB:[id number]

## 2020-11-02 ENCOUNTER — Ambulatory Visit: Payer: Medicare PPO | Admitting: Cardiology

## 2020-12-01 ENCOUNTER — Ambulatory Visit (HOSPITAL_COMMUNITY)
Admission: RE | Admit: 2020-12-01 | Discharge: 2020-12-01 | Disposition: A | Discharge status: Home / Self Care | Payer: Medicare PPO | Source: Ambulatory Visit | Attending: Internal Medicine | Admitting: Internal Medicine

## 2020-12-01 ENCOUNTER — Other Ambulatory Visit (HOSPITAL_COMMUNITY): Payer: Self-pay | Admitting: Internal Medicine

## 2020-12-01 ENCOUNTER — Other Ambulatory Visit: Payer: Self-pay

## 2020-12-01 DIAGNOSIS — I6529 Occlusion and stenosis of unspecified carotid artery: Secondary | ICD-10-CM

## 2020-12-02 ENCOUNTER — Other Ambulatory Visit: Payer: Self-pay | Admitting: Internal Medicine

## 2020-12-03 LAB — COMPLETE METABOLIC PANEL WITH GFR
AG Ratio: 1.2 (calc) (ref 1.0–2.5)
ALT: 7 U/L (ref 6–29)
AST: 14 U/L (ref 10–35)
Albumin: 4 g/dL (ref 3.6–5.1)
Alkaline phosphatase (APISO): 79 U/L (ref 37–153)
BUN/Creatinine Ratio: 19 (calc) (ref 6–22)
BUN: 25 mg/dL (ref 7–25)
CO2: 25 mmol/L (ref 20–32)
Calcium: 9.5 mg/dL (ref 8.6–10.4)
Chloride: 101 mmol/L (ref 98–110)
Creat: 1.33 mg/dL — ABNORMAL HIGH (ref 0.60–0.93)
GFR, Est African American: 46 mL/min/{1.73_m2} — ABNORMAL LOW (ref >=60)
GFR, Est Non African American: 40 mL/min/{1.73_m2} — ABNORMAL LOW (ref >=60)
Globulin: 3.3 g/dL (calc) (ref 1.9–3.7)
Glucose, Bld: 96 mg/dL (ref 65–99)
Potassium: 4.5 mmol/L (ref 3.5–5.3)
Sodium: 138 mmol/L (ref 135–146)
Total Bilirubin: 0.4 mg/dL (ref 0.2–1.2)
Total Protein: 7.3 g/dL (ref 6.1–8.1)

## 2020-12-03 LAB — TSH: TSH: 2.36 mIU/L (ref 0.40–4.50)

## 2020-12-03 LAB — CBC
HCT: 30.2 % — ABNORMAL LOW (ref 35.0–45.0)
Hemoglobin: 9.7 g/dL — ABNORMAL LOW (ref 11.7–15.5)
MCH: 26.8 pg — ABNORMAL LOW (ref 27.0–33.0)
MCHC: 32.1 g/dL (ref 32.0–36.0)
MCV: 83.4 fL (ref 80.0–100.0)
MPV: 9 fL (ref 7.5–12.5)
Platelets: 353 10*3/uL (ref 140–400)
RBC: 3.62 10*6/uL — ABNORMAL LOW (ref 3.80–5.10)
RDW: 12.8 % (ref 11.0–15.0)
WBC: 7.9 10*3/uL (ref 3.8–10.8)

## 2020-12-03 LAB — URIC ACID: Uric Acid, Serum: 4.1 mg/dL (ref 2.5–7.0)

## 2020-12-06 ENCOUNTER — Other Ambulatory Visit: Payer: Self-pay | Admitting: Internal Medicine

## 2020-12-06 DIAGNOSIS — Z1231 Encounter for screening mammogram for malignant neoplasm of breast: Secondary | ICD-10-CM

## 2021-01-25 ENCOUNTER — Inpatient Hospital Stay: Admission: RE | Admit: 2021-01-25 | Payer: Medicare PPO | Source: Ambulatory Visit

## 2021-03-16 ENCOUNTER — Other Ambulatory Visit: Payer: Self-pay

## 2021-03-16 ENCOUNTER — Ambulatory Visit
Admission: RE | Admit: 2021-03-16 | Discharge: 2021-03-16 | Disposition: A | Discharge status: Home / Self Care | Payer: Medicare PPO | Source: Ambulatory Visit | Attending: Internal Medicine | Admitting: Internal Medicine

## 2021-03-16 DIAGNOSIS — Z1231 Encounter for screening mammogram for malignant neoplasm of breast: Secondary | ICD-10-CM

## 2021-05-01 IMAGING — CT CT RENAL STONE PROTOCOL
2 of 4 series · 16 of 46 positions shown, 18 images · non-contrast
Comparison: 05/19/2018 CT abdomen/pelvis.

CLINICAL DATA: Left flank pain. Gas pain. History of
nephrolithiasis.

EXAM:
CT ABDOMEN AND PELVIS WITHOUT CONTRAST
TECHNIQUE: Multidetector CT imaging of the abdomen and pelvis was performed
following the standard protocol without IV contrast.

[Series 2: axial st · axial · 0.76mm/px · z∈[+644,+1004]mm · 13 of 79 slices shown, 15 images]
[im 4/79  soft-tissue]
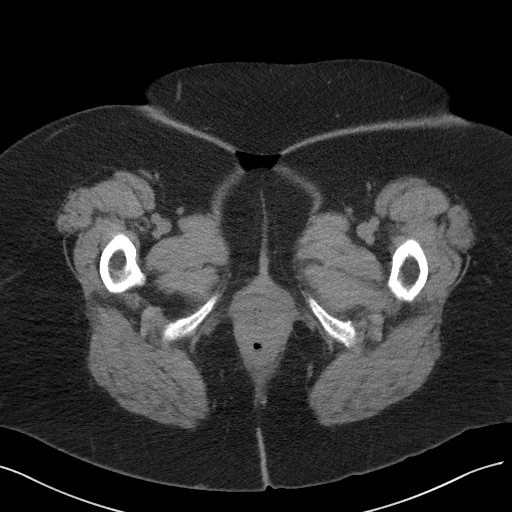
[im 4/79  bone]
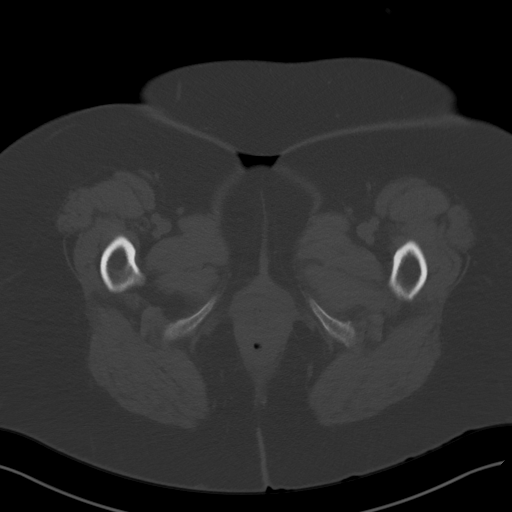
[im 10/79  soft-tissue]
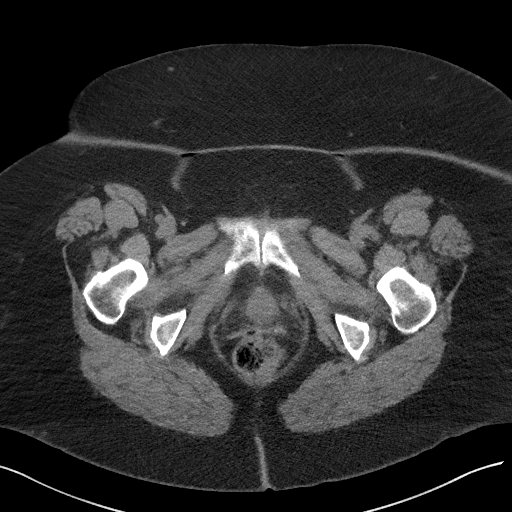
[im 16/79  soft-tissue]
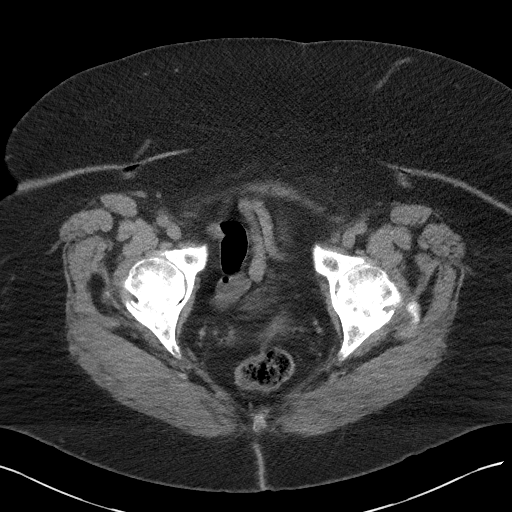
[im 22/79  soft-tissue]
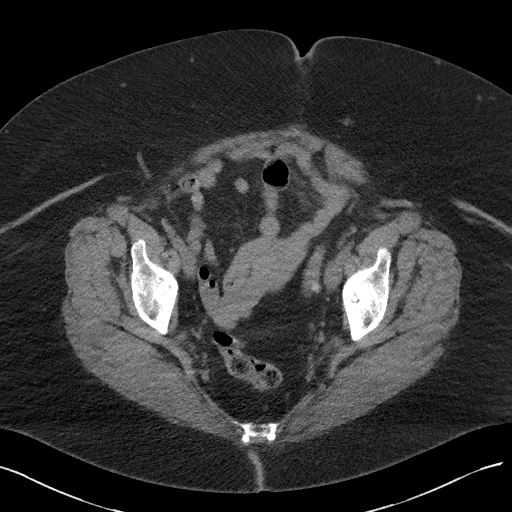
[im 28/79  soft-tissue]
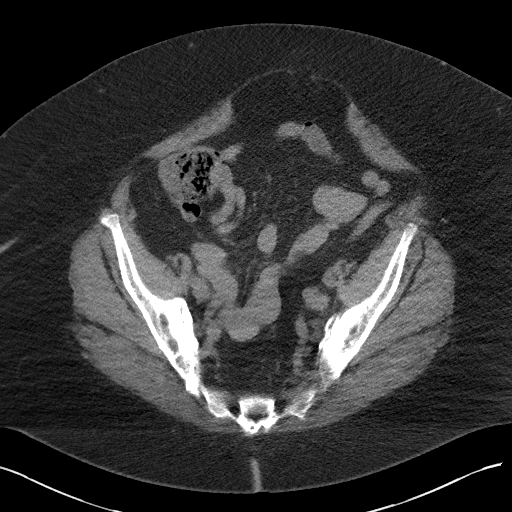
[im 34/79  soft-tissue]
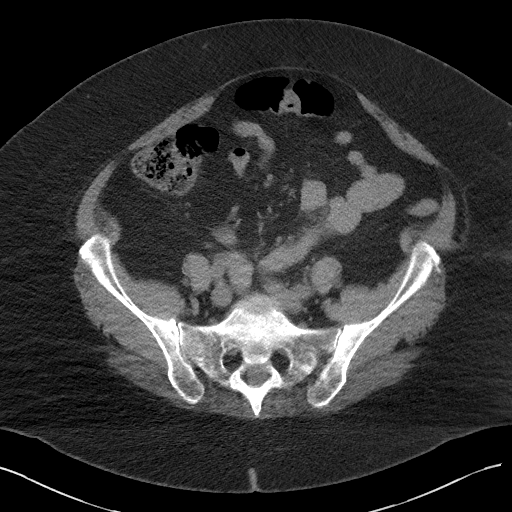
[im 40/79  soft-tissue]
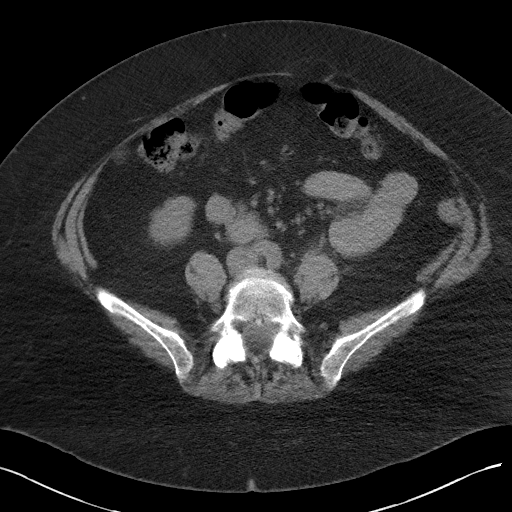
[im 46/79  soft-tissue]
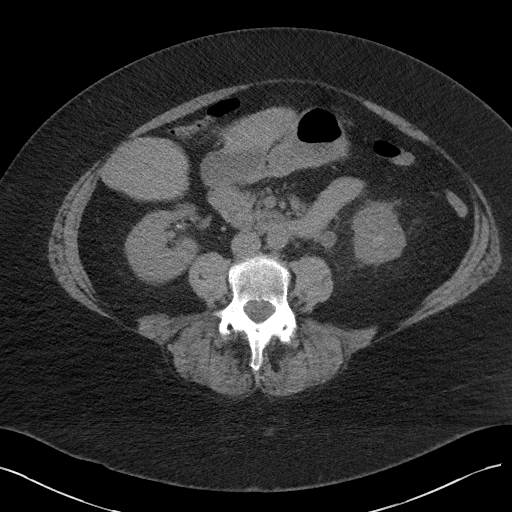
[im 52/79  soft-tissue]
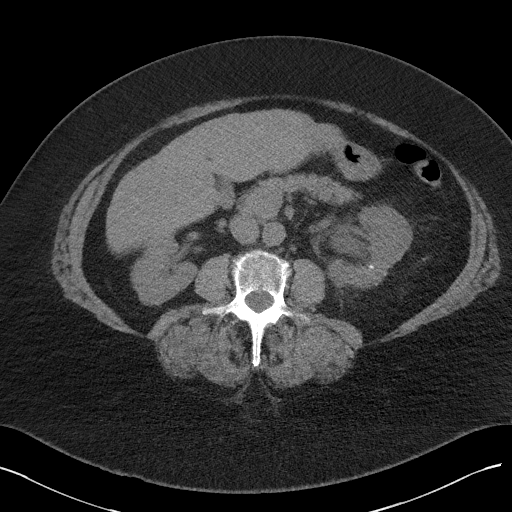
[im 52/79  bone]
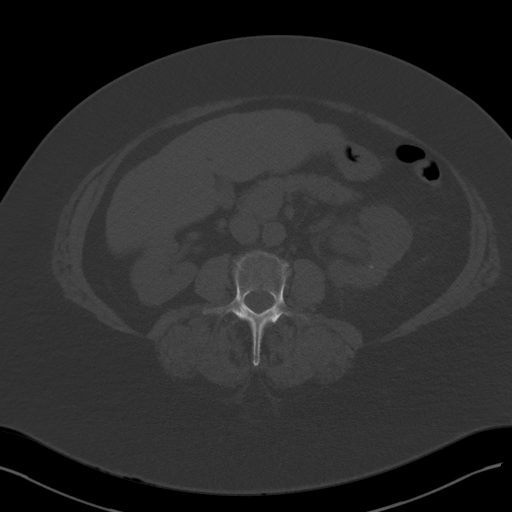
[im 58/79  soft-tissue]
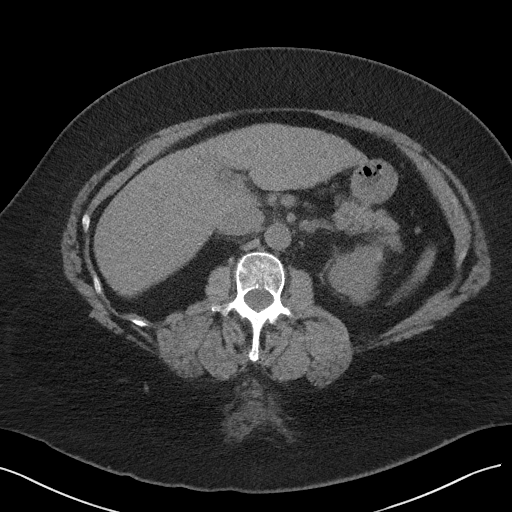
[im 64/79  soft-tissue]
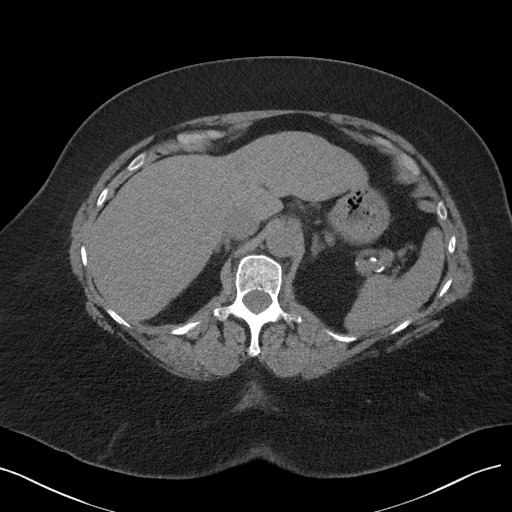
[im 70/79  soft-tissue]
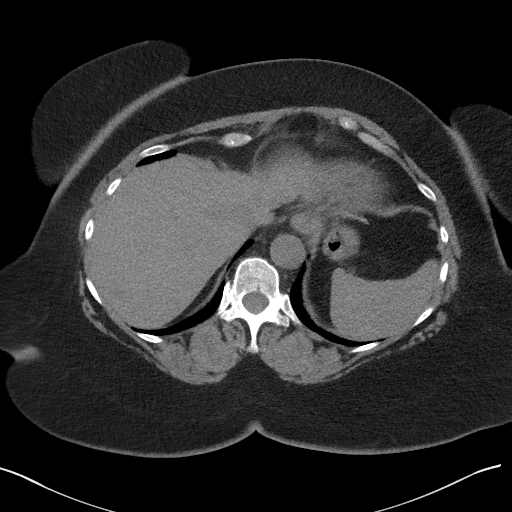
[im 76/79  soft-tissue]
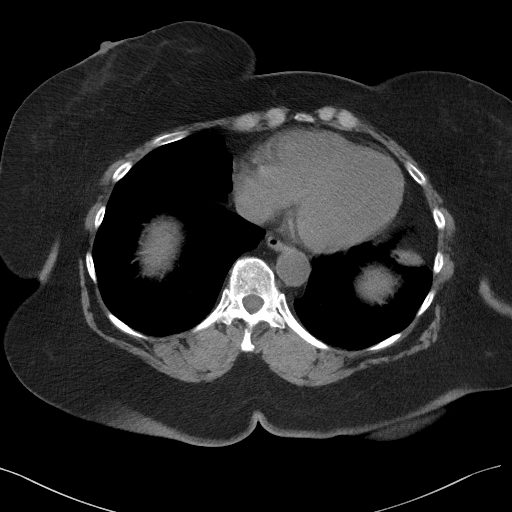

[Series 4: coronal st · coronal · 0.77mm/px · 3 of 104 slices shown]
[im 35/104  soft-tissue]
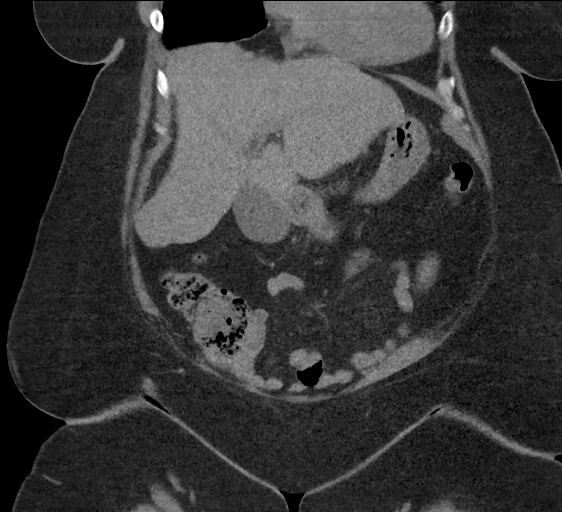
[im 46/104  soft-tissue]
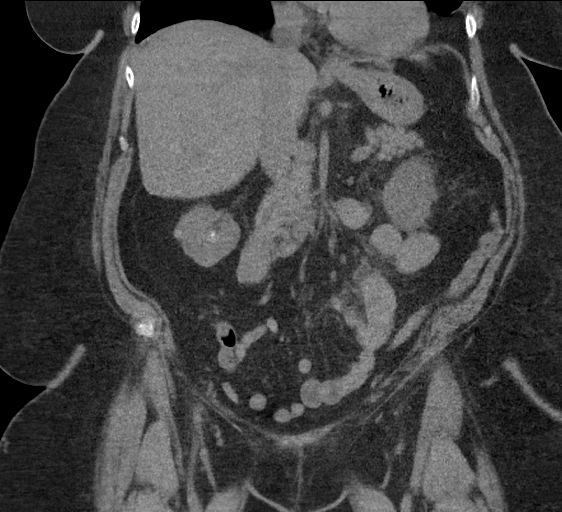
[im 58/104  soft-tissue]
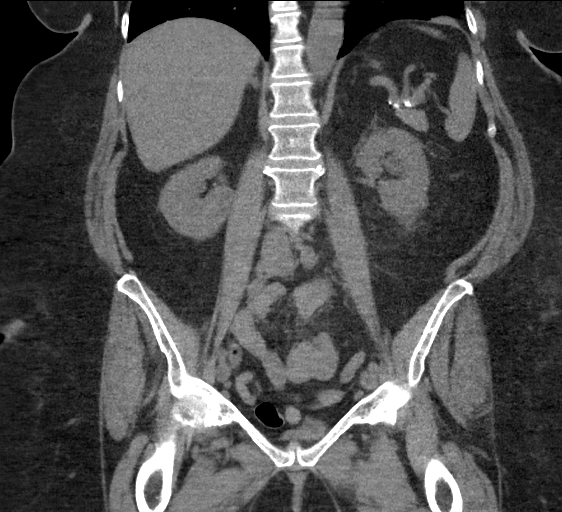

[16 of 46 positions shown; findings below may reference images not displayed]

FINDINGS: Lower chest: Mild cylindrical bronchiectasis in the central right
lung base, unchanged. No acute abnormality at the lung bases.

Hepatobiliary: Suggestion of a slightly irregular liver surface,
cannot exclude cirrhosis. No liver masses. Normal gallbladder with
no radiopaque cholelithiasis. No biliary ductal dilatation.

Pancreas: Normal, with no mass or duct dilation.

Spleen: Normal size. No mass.

Adrenals/Urinary Tract: Normal adrenals. Nonobstructing 9 mm and 1
mm lower right renal stones. No right hydronephrosis. Normal caliber
right ureter with no right ureteral stones. Obstructing clustered 7
mm, 3 mm and 2 mm left mid lumbar ureteral stones with mild to
moderate left hydroureteronephrosis and asymmetric left perinephric
fat stranding. At least 4 additional nonobstructing stones scattered
in the left renal collecting system, largest 6 mm in the lower left
kidney. No contour deforming renal masses. No additional left
ureteral stones. Normal nondistended bladder.

Stomach/Bowel: Normal non-distended stomach. Normal caliber small
bowel with no small bowel wall thickening. Normal appendix. Mild
sigmoid diverticulosis with no large bowel wall thickening or acute
pericolonic fat stranding.

Vascular/Lymphatic: Mildly atherosclerotic nonaneurysmal abdominal
aorta. No pathologically enlarged lymph nodes in the abdomen or
pelvis.

Reproductive: Status post hysterectomy, with no abnormal findings at
the vaginal cuff. No adnexal mass.

Other: No pneumoperitoneum, ascites or focal fluid collection.
Diastasis of the ventral abdominal wall. Multiple small fat
containing midline ventral supraumbilical abdominal hernias.

Musculoskeletal: No aggressive appearing focal osseous lesions. Mild
thoracolumbar spondylosis.
IMPRESSION: 1. Obstructing clustered 7 mm, 3 mm and 2 mm left mid lumbar
ureteral stones with mild to moderate left hydroureteronephrosis.
2. Additional nonobstructing stones in both kidneys.
3. Suggestion of a slightly irregular liver surface, cannot exclude
cirrhosis. Recommend correlation with liver function tests. Consider
outpatient hepatic elastography for further liver fibrosis risk
stratification, as clinically warranted.
4. Mild sigmoid diverticulosis.
5. Aortic Atherosclerosis (F403O-MU3.3).

## 2021-05-14 ENCOUNTER — Other Ambulatory Visit: Payer: Self-pay

## 2021-05-14 ENCOUNTER — Emergency Department (HOSPITAL_COMMUNITY)
Admission: EM | Admit: 2021-05-14 | Discharge: 2021-05-14 | Disposition: A | Discharge status: Home / Self Care | Payer: Medicare PPO | Attending: Emergency Medicine | Admitting: Emergency Medicine

## 2021-05-14 DIAGNOSIS — Z7984 Long term (current) use of oral hypoglycemic drugs: Secondary | ICD-10-CM | POA: Insufficient documentation

## 2021-05-14 DIAGNOSIS — U071 COVID-19: Secondary | ICD-10-CM | POA: Insufficient documentation

## 2021-05-14 DIAGNOSIS — Z85528 Personal history of other malignant neoplasm of kidney: Secondary | ICD-10-CM | POA: Diagnosis not present

## 2021-05-14 DIAGNOSIS — Z96652 Presence of left artificial knee joint: Secondary | ICD-10-CM | POA: Insufficient documentation

## 2021-05-14 DIAGNOSIS — I10 Essential (primary) hypertension: Secondary | ICD-10-CM | POA: Diagnosis not present

## 2021-05-14 DIAGNOSIS — J069 Acute upper respiratory infection, unspecified: Secondary | ICD-10-CM | POA: Diagnosis not present

## 2021-05-14 DIAGNOSIS — Z79899 Other long term (current) drug therapy: Secondary | ICD-10-CM | POA: Insufficient documentation

## 2021-05-14 DIAGNOSIS — E119 Type 2 diabetes mellitus without complications: Secondary | ICD-10-CM | POA: Insufficient documentation

## 2021-05-14 DIAGNOSIS — Z1152 Encounter for screening for COVID-19: Secondary | ICD-10-CM | POA: Diagnosis present

## 2021-05-14 NOTE — ED Provider Notes (Signed)
[facility] DEPT Provider Note   CSN: [id number] Arrival date & time: 05/14/21  1752     History No chief complaint on file.   Debbie Velasquez is a 70 y.o. female.  70 year old female requesting COVID test after exposure to her daughter who has tested positive and her husband who has mild symptoms, test pending.  Patient has a history of diabetes, hypertension.  No specific complaints at this time.  Debbie Velasquez was evaluated in Emergency Department on 05/14/2021 for the symptoms described in the history of present illness. She was evaluated in the context of the global COVID-19 pandemic, which necessitated consideration that the patient might be at risk for infection with the SARS-CoV-2 virus that causes COVID-19. Institutional protocols and algorithms that pertain to the evaluation of patients at risk for COVID-19 are in a state of rapid change based on information released by regulatory bodies including the CDC and federal and state organizations. These policies and algorithms were followed during the patient's care in the ED.       Past Medical History:  Diagnosis Date   Anemia    Arthritis    Cancer of left kidney ([place])    Left renal mass   GERD (gastroesophageal reflux disease)    History of kidney stones    x3 -remains with one on right.    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   Chronic low back pain without sciatica 06/18/2017   Primary osteoarthritis of right knee 03/26/2017    Presence of left artificial knee joint 02/26/2017   Unilateral primary osteoarthritis, left knee 01/12/2017   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/12/2017   Renal mass 12/22/2015    Past Surgical History:  Procedure Laterality Date   CESAREAN SECTION  yrs ago   [place], URETEROSCOPY AND STENT PLACEMENT Left 05/24/2018   Procedure: CYSTOSCOPY WITH RETROGRADE PYELOGRAM, URETEROSCOPY AND STENT PLACEMENT;  Surgeon: Alexis Frock, MD;  Location: [facility];  Service: Urology;  Laterality: Left;   CYSTOSCOPY WITH RETROGRADE PYELOGRAM, URETEROSCOPY AND STENT PLACEMENT Left 02/27/2020   Procedure: CYSTOSCOPY WITH RETROGRADE PYELOGRAM, URETEROSCOPY AND STENT PLACEMENT;  Surgeon: Alexis Frock, MD;  Location: [facility];  Service: Urology;  Laterality: Left;  1 HR   CYSTOSCOPY WITH RETROGRADE PYELOGRAM, URETEROSCOPY AND STENT PLACEMENT Bilateral 10/20/2020   Procedure: CYSTOSCOPY WITH RETROGRADE PYELOGRAM, URETEROSCOPY AND STENT PLACEMENT;  Surgeon: Alexis Frock, MD;  Location: [facility];  Service: Urology;  Laterality: Bilateral;  75 MINS   HOLMIUM LASER APPLICATION Left [id number]   Procedure: HOLMIUM LASER APPLICATION;  Surgeon: Alexis Frock, MD;  Location: [facility];  Service: Urology;  Laterality: Left;   HOLMIUM LASER APPLICATION Left [id number]   Procedure: HOLMIUM LASER APPLICATION;  Surgeon: Alexis Frock, MD;  [facility]: [facility];  Service: Urology;  Laterality: Left;   HOLMIUM LASER APPLICATION Bilateral [id number]  Procedure: HOLMIUM LASER APPLICATION;  Surgeon: Alexis Frock, MD;  [facility]: [facility];  Service: Urology;  Laterality: Bilateral;   ROBOTIC ASSITED PARTIAL NEPHRECTOMY Left 12/22/2015   Procedure: XI ROBOTIC ASSITED PARTIAL NEPHRECTOMY;  Surgeon: Alexis Frock, MD;  [facility]: [facility];  Service: Urology;  Laterality: Left;   TOTAL ABDOMINAL HYSTERECTOMY W/ BILATERAL SALPINGOOPHORECTOMY  1980's   TOTAL KNEE ARTHROPLASTY Left 01/12/2017   Procedure: LEFT  TOTAL KNEE ARTHROPLASTY and Right knee steroid injection;  Surgeon: Mcarthur Rossetti, MD;  [facility]: [facility];  Service: Orthopedics;  Laterality: Left;     OB History   No obstetric history on file.     Family History  Problem Relation Age of Onset   Esophageal cancer Maternal Aunt    Colon cancer Neg Hx    Stomach cancer Neg Hx    Rectal cancer Neg Hx     Social History   Tobacco Use   Smoking status: Never   Smokeless tobacco: Never  Vaping Use   Vaping Use: Never used  Substance Use Topics   Alcohol use: No    Alcohol/week: 0.0 standard drinks   Drug use: No    Home Medications Prior to Admission medications   Medication Sig Start Date End Date Taking? Authorizing Provider  cephALEXin (KEFLEX) 500 MG capsule Take 1 capsule (500 mg total) by mouth 2 (two) times daily. X 5 days to prevent infection with tethered stents. 10/20/20   Alexis Frock, MD  ketorolac (TORADOL) 10 MG tablet Take 1 tablet (10 mg total) by mouth every 8 (eight) hours as needed for moderate pain (or stent discomfort post-operatively). 10/20/20   Alexis Frock, MD  losartan-hydrochlorothiazide (HYZAAR) 100-25 MG tablet Take 1 tablet by mouth daily. 03/16/20   [provider]  metFORMIN (GLUCOPHAGE) 500 MG tablet Take 500 mg by mouth daily with breakfast.     [provider]  oxyCODONE-acetaminophen (PERCOCET) 5-325 MG tablet Take 1 tablet by mouth every 4 (four) hours as needed for severe pain. 10/20/20 10/20/21  Alexis Frock, MD  pantoprazole (PROTONIX) 40 MG tablet Take 1 tablet (40 mg total) by mouth daily. Patient taking differently: Take 40 mg by mouth as needed. 10/10/20 10/10/21  Charlynne Cousins, MD  Vitamin D, Ergocalciferol, (DRISDOL) 50000 units CAPS capsule Take 50,000 Units by mouth every Thursday. 08/06/17   [provider]    Allergies    Patient has no known allergies.  Review of Systems   Review of Systems  Constitutional:  Negative for  chills and fever.  HENT:  Negative for congestion.   Respiratory:  Negative for cough.   Musculoskeletal:  Negative for arthralgias and myalgias.  Skin:  Negative for rash and wound.  Allergic/Immunologic: Positive for immunocompromised state.  Neurological:  Negative for weakness.  Hematological:  Negative for adenopathy.  All other systems reviewed and are negative.  Physical Exam Updated Vital Signs BP (!) 167/87 (BP Location: Right Arm)   Pulse 84   Temp 98.6 F (37 C) (Oral)   Resp 16   Ht '5\' 4"'$  (1.626 m)   Wt 113.4 kg   SpO2 99%   BMI 42.91 kg/m   Physical Exam Vitals and nursing note reviewed.  Constitutional:      General: She is not in acute distress.    Appearance: She is well-developed. She is not diaphoretic.  HENT:     Head: Normocephalic and atraumatic.  Eyes:     Conjunctiva/sclera: Conjunctivae normal.  Cardiovascular:     Rate and  Rhythm: Normal rate and regular rhythm.     Pulses: Normal pulses.     Heart sounds: Normal heart sounds.  Pulmonary:     Effort: Pulmonary effort is normal.     Breath sounds: Normal breath sounds.  Abdominal:     Palpations: Abdomen is soft.     Tenderness: There is no abdominal tenderness.  Musculoskeletal:     Cervical back: Neck supple.  Skin:    General: Skin is warm and dry.     Findings: No erythema or rash.  Neurological:     Mental Status: She is alert and oriented to person, place, and time.  Psychiatric:        Behavior: Behavior normal.    ED Results / Procedures / Treatments   Labs (all labs ordered are listed, but only abnormal results are displayed) Labs Reviewed  SARS CORONAVIRUS 2 (TAT 6-24 HRS)    EKG None  Radiology No results found.  Procedures Procedures   Medications Ordered in ED Medications - No data to display  ED Course  I have reviewed the triage vital signs and the nursing notes.  Pertinent labs & imaging results that were available during my care of the patient were  reviewed by me and considered in my medical decision making (see chart for details).  Clinical Course as of 05/14/21 1946  Sat May 15, 8655  2127 70 year old female with COVID exposure without complaint requesting COVID test. Patient is well-appearing, vitals reassuring including O2 sat of 99% on room air. Plan is to send COVID test, advised patient to monitor his MyChart account for her results and call PCP to discuss if she is a candidate for antiviral therapy if her test is positive. [LM]    Clinical Course User Index [LM] Roque Lias   MDM Rules/Calculators/A&P                           Final Clinical Impression(s) / ED Diagnoses Final diagnoses:  Viral URI    Rx / DC Orders ED Discharge Orders     None        Roque Lias 05/14/21 1946    Dorie Rank, MD 05/16/21 [id number]

## 2021-05-14 NOTE — ED Triage Notes (Signed)
Daughter went to the beach and has Covid, requesting Covid testing.

## 2021-05-14 NOTE — Discharge Instructions (Addendum)
Home to quarantine.  Your COVID test results will be available in your MyChart in the next 24 hours. If your COVID test is positive, call your doctor's office to see if you are a candidate for antiviral therapy.

## 2021-05-15 ENCOUNTER — Ambulatory Visit: Payer: Medicare PPO

## 2021-05-15 LAB — SARS CORONAVIRUS 2 (TAT 6-24 HRS): SARS Coronavirus 2: POSITIVE — AB

## 2021-08-17 ENCOUNTER — Encounter: Payer: Self-pay | Admitting: Physician Assistant

## 2021-08-17 ENCOUNTER — Ambulatory Visit (INDEPENDENT_AMBULATORY_CARE_PROVIDER_SITE_OTHER): Payer: Medicare PPO | Admitting: Physician Assistant

## 2021-08-17 ENCOUNTER — Other Ambulatory Visit: Payer: Self-pay

## 2021-08-17 DIAGNOSIS — M25561 Pain in right knee: Secondary | ICD-10-CM | POA: Diagnosis not present

## 2021-08-17 DIAGNOSIS — M1711 Unilateral primary osteoarthritis, right knee: Secondary | ICD-10-CM | POA: Diagnosis not present

## 2021-08-17 MED ORDER — LIDOCAINE HCL 1 % IJ SOLN
3.0000 mL | INTRAMUSCULAR | Status: AC | PRN
  Administered 2021-08-17: 3 mL

## 2021-08-17 MED ORDER — METHYLPREDNISOLONE ACETATE 40 MG/ML IJ SUSP
40.0000 mg | INTRAMUSCULAR | Status: AC | PRN
Start: 2021-08-17 — End: 2021-08-17
  Administered 2021-08-17: 40 mg via INTRA_ARTICULAR

## 2021-08-17 NOTE — Progress Notes (Signed)
   Procedure Note  Patient: Debbie Velasquez             Date of Birth: [birth year]           MRN: [id number]             Visit Date: 08/17/2021  HPI: Debbie Velasquez returns today due to right knee pain.  She has known osteoarthritis with bone-on-bone medial compartment and severe patellofemoral arthritic changes.  It has been well over a year since we last saw her she had a cortisone injection.  She denies any new injury to the knee.  Pain is mostly medial aspect the knee.  She is requesting cortisone injection in the knee today.  Review of systems: No fevers, chills, injury to the right knee or recent vaccines.  Physical exam: Right knee good range of motion.  Patellofemoral crepitus.  Tenderness along medial joint line.  No gross instability valgus varus stressing of either knee.  Calves are supple nontender bilaterally.  Slight edema bilateral lower legs.  Well-healed surgical incision from left total knee arthroplasty.  Procedures: Visit Diagnoses:  1. Primary osteoarthritis of right knee     Large Joint Inj: R knee on 08/17/2021 1:56 PM Indications: pain Details: 22 G 1.5 in needle, anterolateral approach  Arthrogram: No  Medications: 3 mL lidocaine 1 %; 40 mg methylPREDNISolone acetate 40 MG/ML Outcome: tolerated well, no immediate complications Procedure, treatment alternatives, risks and benefits explained, specific risks discussed. Consent was given by the patient. Immediately prior to procedure a time out was called to verify the correct patient, procedure, equipment, support staff and site/side marked as required. Patient was prepped and draped in the usual sterile fashion.     Plan: Debbie Velasquez understands that she needs to wait least 3 months between injections.  She will work on Forensic scientist.  Did discuss with her that the swelling in her lower legs recommend compression hose to be worn during the day as needed.  Questions encouraged and answered.

## 2021-08-18 ENCOUNTER — Ambulatory Visit
Admission: EM | Admit: 2021-08-18 | Discharge: 2021-08-18 | Disposition: A | Discharge status: Home / Self Care | Payer: Medicare PPO

## 2021-08-18 ENCOUNTER — Encounter: Payer: Self-pay | Admitting: Emergency Medicine

## 2021-08-18 ENCOUNTER — Ambulatory Visit (INDEPENDENT_AMBULATORY_CARE_PROVIDER_SITE_OTHER): Payer: Medicare PPO

## 2021-08-18 DIAGNOSIS — J069 Acute upper respiratory infection, unspecified: Secondary | ICD-10-CM

## 2021-08-18 DIAGNOSIS — R059 Cough, unspecified: Secondary | ICD-10-CM

## 2021-08-18 DIAGNOSIS — R053 Chronic cough: Secondary | ICD-10-CM | POA: Diagnosis not present

## 2021-08-18 MED ORDER — BENZONATATE 100 MG PO CAPS: 100.0000 mg | ORAL_CAPSULE | Freq: Three times a day (TID) | ORAL | 0 refills | Status: DC | PRN

## 2021-08-18 MED ORDER — AZITHROMYCIN 500 MG PO TABS: ORAL_TABLET | ORAL | 0 refills | Status: AC

## 2021-08-18 NOTE — Discharge Instructions (Signed)
Your chest x-ray was normal.  You have been prescribed an antibiotic and cough medication to help alleviate your symptoms.  Please follow-up with primary care for further evaluation and management.

## 2021-08-18 NOTE — ED Triage Notes (Signed)
Patient c/o non-productive cough for several weeks, head congestion, wheezing.  Patient has taken Tylenol.  Patient is vaccinated for COVID.

## 2021-08-18 NOTE — ED Provider Notes (Signed)
[facility]    CSN: [id number] Arrival date & time: 08/18/21  0944      History   Chief Complaint Chief Complaint  Patient presents with   Cough    HPI Debbie Velasquez is a 70 y.o. female.   Patient presents with nonproductive cough, "itchy throat", nasal congestion, wheezing that has been present for several weeks.  Patient reports that she has taken Tylenol but no other medications to help alleviate symptoms.  Denies chest pain or shortness of breath.  Denies any known fevers or sick contacts.  Denies nausea, vomiting, abdominal pain, diarrhea.   Cough  Past Medical History:  Diagnosis Date   Anemia    Arthritis    Cancer of left kidney ([place])    Left renal mass   GERD (gastroesophageal reflux disease)    History of kidney stones    x3 -remains with one on right.    Hx of seasonal allergies    rare flare ups   Hypertension    Left renal mass    Obesity    Pneumonia    hx of 20 years ago   Right ureteral stone    Type 2 diabetes mellitus ([place])    Type 2   Wears glasses    Wears partial dentures    upper    Patient Active Problem List   Diagnosis Date Noted   Chest pain 10/09/2020   Nausea 10/09/2020   Hypertension    Type 2 diabetes mellitus (HCC)    GERD (gastroesophageal reflux disease)    Facet degeneration of lumbar region 12/18/2018   Chronic low back pain without sciatica 06/18/2017   Primary osteoarthritis of right knee 03/26/2017   Presence of left artificial knee joint 02/26/2017   Unilateral primary osteoarthritis, left knee 01/12/2017   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/12/2017   Renal mass 12/22/2015    Past Surgical History:  Procedure Laterality Date   CESAREAN SECTION  yrs ago   [place], URETEROSCOPY AND STENT PLACEMENT Left 05/24/2018   Procedure: CYSTOSCOPY WITH RETROGRADE PYELOGRAM, URETEROSCOPY AND STENT PLACEMENT;  Surgeon: Alexis Frock, MD;  Location: [facility];  Service:  Urology;  Laterality: Left;   CYSTOSCOPY WITH RETROGRADE PYELOGRAM, URETEROSCOPY AND STENT PLACEMENT Left 02/27/2020   Procedure: CYSTOSCOPY WITH RETROGRADE PYELOGRAM, URETEROSCOPY AND STENT PLACEMENT;  Surgeon: Alexis Frock, MD;  [facility];  Service: Urology;  Laterality: Left;  1 HR   CYSTOSCOPY WITH RETROGRADE PYELOGRAM, URETEROSCOPY AND STENT PLACEMENT Bilateral 10/20/2020   Procedure: CYSTOSCOPY WITH RETROGRADE PYELOGRAM, URETEROSCOPY AND STENT PLACEMENT;  Surgeon: Alexis Frock, MD;  [facility]: [facility];  Service: Urology;  Laterality: Bilateral;  75 MINS   HOLMIUM LASER APPLICATION Left 06/26/7653   Procedure: HOLMIUM LASER APPLICATION;  Surgeon: Alexis Frock, MD;  [facility]: [facility];  Service: Urology;  Laterality: Left;   HOLMIUM LASER APPLICATION Left 03/27/353   Procedure: HOLMIUM LASER APPLICATION;  Surgeon: Alexis Frock, MD;  [facility];  Service: Urology;  Laterality: Left;   HOLMIUM LASER APPLICATION Bilateral 65/68/1275   Procedure: HOLMIUM LASER APPLICATION;  Surgeon: Alexis Frock, MD;  [facility];  Service: Urology;  Laterality: Bilateral;   ROBOTIC ASSITED PARTIAL NEPHRECTOMY Left 12/22/2015   Procedure: XI ROBOTIC ASSITED PARTIAL NEPHRECTOMY;  Surgeon: Alexis Frock, MD;  [facility];  Service: Urology;  Laterality: Left;   TOTAL ABDOMINAL HYSTERECTOMY W/ BILATERAL SALPINGOOPHORECTOMY  1980's   TOTAL KNEE ARTHROPLASTY Left 01/12/2017   Procedure: LEFT TOTAL KNEE ARTHROPLASTY  and Right knee steroid injection;  Surgeon: Mcarthur Rossetti, MD;  [facility]: [facility];  Service: Orthopedics;  Laterality: Left;    OB History   No obstetric history on file.      Home Medications    Prior to Admission medications   Medication Sig Start Date End Date Taking? Authorizing Provider  allopurinol (ZYLOPRIM) 300 MG tablet Take 300 mg by mouth daily. 07/20/21  Yes [provider]  aspirin EC 81 MG tablet Take 81 mg by mouth daily. Swallow whole.    Yes [provider]  azithromycin (ZITHROMAX) 500 MG tablet Take 1 tablet (500 mg total) by mouth daily for 1 day, THEN 0.5 tablets (250 mg total) daily for 4 days. 08/18/21 08/23/21 Yes Jakhiya Brower, Michele Rockers, FNP  benzonatate (TESSALON) 100 MG capsule Take 1 capsule (100 mg total) by mouth every 8 (eight) hours as needed for cough. 08/18/21  Yes Kalika Smay, Hildred Alamin E, FNP  losartan-hydrochlorothiazide (HYZAAR) 100-25 MG tablet Take 1 tablet by mouth daily. 03/16/20  Yes [provider]  metFORMIN (GLUCOPHAGE) 500 MG tablet Take 500 mg by mouth daily with breakfast.    Yes [provider]  Vitamin D, Ergocalciferol, (DRISDOL) 50000 units CAPS capsule Take 50,000 Units by mouth every Thursday. 08/06/17  Yes [provider]  cephALEXin (KEFLEX) 500 MG capsule Take 1 capsule (500 mg total) by mouth 2 (two) times daily. X 5 days to prevent infection with tethered stents. 10/20/20   Alexis Frock, MD  ketorolac (TORADOL) 10 MG tablet Take 1 tablet (10 mg total) by mouth every 8 (eight) hours as needed for moderate pain (or stent discomfort post-operatively). 10/20/20   Alexis Frock, MD  oxyCODONE-acetaminophen (PERCOCET) 5-325 MG tablet Take 1 tablet by mouth every 4 (four) hours as needed for severe pain. 10/20/20 10/20/21  Alexis Frock, MD  pantoprazole (PROTONIX) 40 MG tablet Take 1 tablet (40 mg total) by mouth daily. Patient taking differently: Take 40 mg by mouth as needed. 10/10/20 10/10/21  Charlynne Cousins, MD    Family History Family History  Problem Relation Age of Onset   Esophageal cancer Maternal Aunt    Colon cancer Neg Hx    Stomach cancer Neg Hx    Rectal cancer Neg Hx     Social History Social History   Tobacco Use   Smoking status: Never   Smokeless tobacco: Never  Vaping Use   Vaping Use: Never used  Substance Use Topics   Alcohol use: No    Alcohol/week: 0.0 standard drinks   Drug use: No     Allergies   Patient has no known  allergies.   Review of Systems Review of Systems Per HPI  Physical Exam Triage Vital Signs ED Triage Vitals [08/18/21 1039]  Enc Vitals Group     BP (!) 175/93     Pulse Rate 64     Resp 20     Temp 97.6 F (36.4 C)     Temp Source Oral     SpO2 98 %     Weight 250 lb (113.4 kg)     Height 5\' 4"  (1.626 m)     Head Circumference      Peak Flow      Pain Score 0     Pain Loc      Pain Edu?      Excl. in [place]?    No data found.  Updated Vital Signs BP (!) 175/93 (BP Location: Left Arm)   Pulse 64  Temp 97.6 F (36.4 C) (Oral)   Resp 20   Ht 5\' 4"  (1.626 m)   Wt 250 lb (113.4 kg)   SpO2 98%   BMI 42.91 kg/m   Visual Acuity Right Eye Distance:   Left Eye Distance:   Bilateral Distance:    Right Eye Near:   Left Eye Near:    Bilateral Near:     Physical Exam Constitutional:      General: She is not in acute distress.    Appearance: Normal appearance.  HENT:     Head: Normocephalic and atraumatic.     Right Ear: Tympanic membrane and ear canal normal.     Left Ear: Tympanic membrane and ear canal normal.     Nose: Congestion present.     Mouth/Throat:     Mouth: Mucous membranes are moist.     Pharynx: No posterior oropharyngeal erythema.  Eyes:     Extraocular Movements: Extraocular movements intact.     Conjunctiva/sclera: Conjunctivae normal.     Pupils: Pupils are equal, round, and reactive to light.  Cardiovascular:     Rate and Rhythm: Normal rate and regular rhythm.     Pulses: Normal pulses.     Heart sounds: Normal heart sounds.  Pulmonary:     Effort: Pulmonary effort is normal. No respiratory distress.     Breath sounds: Normal breath sounds. No stridor. No wheezing, rhonchi or rales.  Abdominal:     General: Abdomen is flat. Bowel sounds are normal.     Palpations: Abdomen is soft.  Musculoskeletal:        General: Normal range of motion.     Cervical back: Normal range of motion.  Skin:    General: Skin is warm and dry.   Neurological:     General: No focal deficit present.     Mental Status: She is alert and oriented to person, place, and time. Mental status is at baseline.  Psychiatric:        Mood and Affect: Mood normal.        Behavior: Behavior normal.     UC Treatments / Results  Labs (all labs ordered are listed, but only abnormal results are displayed) Labs Reviewed - No data to display  EKG   Radiology DG Chest 2 View  Result Date: 08/18/2021 CLINICAL DATA:  Cough EXAM: CHEST - 2 VIEW COMPARISON:  Chest x-ray 10/09/2020 FINDINGS: Heart is mildly enlarged. Mediastinum appears stable. Minor opacities of left lung base similar to previous study may represent scarring or subsegmental atelectasis. No focal consolidation identified otherwise. No pleural effusion or pneumothorax. No acute osseous abnormality appreciated. IMPRESSION: Mild cardiomegaly with no acute intrathoracic process identified. Electronically Signed   By: Ofilia Neas M.D.   On: 08/18/2021 11:30    Procedures Procedures (including critical care time)  Medications Ordered in UC Medications - No data to display  Initial Impression / Assessment and Plan / UC Course  I have reviewed the triage vital signs and the nursing notes.  Pertinent labs & imaging results that were available during my care of the patient were reviewed by me and considered in my medical decision making (see chart for details).     Chest x-ray was negative for any acute cardiopulmonary process.  Will treat with azithromycin to cover for atypicals.  Suspect acute bronchitis.  Unable to prescribe prednisone steroid as patient has type 2 diabetes and recently received a steroid injection in knee yesterday.  Benzonatate to take as  needed for cough.  No red flags seen on exam.  Patient does have elevated blood pressure reading in urgent care today but states that she has not taken her blood pressure medications this morning.  Patient to restart medications  as prescribed.  No signs of hypertensive urgency.  Patient was advised to follow-up with PCP for mild cardiomegaly seen on chest x-ray.  Discussed strict return precautions. Patient verbalized understanding and is agreeable with plan.  Final Clinical Impressions(s) / UC Diagnoses   Final diagnoses:  Persistent cough for 3 weeks or longer  Acute upper respiratory infection     Discharge Instructions      Your chest x-ray was normal.  You have been prescribed an antibiotic and cough medication to help alleviate your symptoms.  Please follow-up with primary care for further evaluation and management.     ED Prescriptions     Medication Sig Dispense Auth. Provider   azithromycin (ZITHROMAX) 500 MG tablet Take 1 tablet (500 mg total) by mouth daily for 1 day, THEN 0.5 tablets (250 mg total) daily for 4 days. 3 tablet [place], [place] E, [place]   benzonatate (TESSALON) 100 MG capsule Take 1 capsule (100 mg total) by mouth every 8 (eight) hours as needed for cough. 21 capsule [place], Michele Rockers, [place]      PDMP not reviewed this encounter.   Teodora Medici, [place] 08/18/21 1152

## 2021-09-23 ENCOUNTER — Other Ambulatory Visit: Payer: Self-pay

## 2021-09-23 ENCOUNTER — Ambulatory Visit
Admission: EM | Admit: 2021-09-23 | Discharge: 2021-09-23 | Disposition: A | Discharge status: Home / Self Care | Payer: Medicare PPO | Attending: Internal Medicine | Admitting: Internal Medicine

## 2021-09-23 DIAGNOSIS — J069 Acute upper respiratory infection, unspecified: Secondary | ICD-10-CM | POA: Insufficient documentation

## 2021-09-23 DIAGNOSIS — R590 Localized enlarged lymph nodes: Secondary | ICD-10-CM | POA: Insufficient documentation

## 2021-09-23 LAB — POCT RAPID STREP A (OFFICE): Rapid Strep A Screen: NEGATIVE

## 2021-09-23 MED ORDER — AMOXICILLIN-POT CLAVULANATE 875-125 MG PO TABS: 1.0000 | ORAL_TABLET | Freq: Two times a day (BID) | ORAL | 0 refills | Status: DC

## 2021-09-23 NOTE — ED Triage Notes (Signed)
5 day h/o sinus pressure and soreness on the right side of her neck when she swallows. Pt c/o "hacky" cough and drainage at night. Has been taking tylenol with some relief.  Pt is suspicious of a sinus infection. Pt last seen on 10/27 and notes sxs cleared with antibiotics until now.

## 2021-09-23 NOTE — Discharge Instructions (Signed)
Rapid strep test was negative.  Throat culture and COVID-19 viral swab are pending.  It appears that you may have a sinus infection.  Augmentin has been prescribed to help treat this.

## 2021-09-23 NOTE — ED Provider Notes (Signed)
[facility]    CSN: [id number] Arrival date & time: 09/23/21  1007      History   Chief Complaint Chief Complaint  Patient presents with   sinus pressure    HPI Debbie Velasquez is a 70 y.o. female.   Patient presents with 5-day history of sinus pressure, nasal congestion, soreness and right-sided neck that occurs mainly when she swallows, nonproductive cough.  Denies any chest pain, shortness of breath, sore throat, ear pain, fevers.  Denies any known sick contacts.  Patient has taken Tylenol with some relief of pain.    Past Medical History:  Diagnosis Date   Anemia    Arthritis    Cancer of left kidney ([place])    Left renal mass   GERD (gastroesophageal reflux disease)    History of kidney stones    x3 -remains with one on right.    Hx of seasonal allergies    rare flare ups   Hypertension    Left renal mass    Obesity    Pneumonia    hx of 20 years ago   Right ureteral stone    Type 2 diabetes mellitus ([place])    Type 2   Wears glasses    Wears partial dentures    upper    Patient Active Problem List   Diagnosis Date Noted   Chest pain 10/09/2020   Nausea 10/09/2020   Hypertension    Type 2 diabetes mellitus (HCC)    GERD (gastroesophageal reflux disease)    Facet degeneration of lumbar region 12/18/2018   Chronic low back pain without sciatica 06/18/2017   Primary osteoarthritis of right knee 03/26/2017   Presence of left artificial knee joint 02/26/2017   Unilateral primary osteoarthritis, left knee 01/12/2017   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/12/2017   Renal mass 12/22/2015    Past Surgical History:  Procedure Laterality Date   CESAREAN SECTION  yrs ago   [place], URETEROSCOPY AND STENT PLACEMENT Left 05/24/2018   Procedure: CYSTOSCOPY WITH RETROGRADE PYELOGRAM, URETEROSCOPY AND STENT PLACEMENT;  Surgeon: Alexis Frock, MD;  Location: [facility];  Service: Urology;  Laterality: Left;    CYSTOSCOPY WITH RETROGRADE PYELOGRAM, URETEROSCOPY AND STENT PLACEMENT Left 02/27/2020   Procedure: CYSTOSCOPY WITH RETROGRADE PYELOGRAM, URETEROSCOPY AND STENT PLACEMENT;  Surgeon: Alexis Frock, MD;  Location: [facility];  Service: Urology;  Laterality: Left;  1 HR   CYSTOSCOPY WITH RETROGRADE PYELOGRAM, URETEROSCOPY AND STENT PLACEMENT Bilateral 10/20/2020   Procedure: CYSTOSCOPY WITH RETROGRADE PYELOGRAM, URETEROSCOPY AND STENT PLACEMENT;  Surgeon: Alexis Frock, MD;  Location: [facility];  Service: Urology;  Laterality: Bilateral;  75 MINS   HOLMIUM LASER APPLICATION Left 01/24/1539   Procedure: HOLMIUM LASER APPLICATION;  Surgeon: Alexis Frock, MD;  Location: [facility];  Service: Urology;  Laterality: Left;   HOLMIUM LASER APPLICATION Left 0/05/6760   Procedure: HOLMIUM LASER APPLICATION;  Surgeon: Alexis Frock, MD;  Location: [facility];  Service: Urology;  Laterality: Left;   HOLMIUM LASER APPLICATION Bilateral 95/06/3266   Procedure: HOLMIUM LASER APPLICATION;  Surgeon: Alexis Frock, MD;  Location: [facility];  Service: Urology;  Laterality: Bilateral;   ROBOTIC ASSITED PARTIAL NEPHRECTOMY Left 12/22/2015   Procedure: XI ROBOTIC ASSITED PARTIAL NEPHRECTOMY;  Surgeon: Alexis Frock, MD;  Location: [facility];  Service: Urology;  Laterality: Left;   TOTAL ABDOMINAL HYSTERECTOMY W/ BILATERAL SALPINGOOPHORECTOMY  1980's   TOTAL KNEE ARTHROPLASTY Left 01/12/2017   Procedure: LEFT TOTAL KNEE ARTHROPLASTY and Right knee steroid injection;  Surgeon: Mcarthur Rossetti, MD;  Location: [facility];  Service: Orthopedics;  Laterality: Left;    OB History   No obstetric history on file.      Home Medications    Prior to Admission medications   Medication Sig Start Date End Date Taking? Authorizing Provider  amoxicillin-clavulanate (AUGMENTIN) 875-125 MG tablet Take 1 tablet by mouth every 12 (twelve) hours. 09/23/21  Yes Delta Deshmukh, Hildred Alamin E, FNP  allopurinol (ZYLOPRIM) 300 MG tablet Take 300 mg by mouth daily.  07/20/21   [provider]  aspirin EC 81 MG tablet Take 81 mg by mouth daily. Swallow whole.    [provider]  benzonatate (TESSALON) 100 MG capsule Take 1 capsule (100 mg total) by mouth every 8 (eight) hours as needed for cough. 08/18/21   Teodora Medici, FNP  cephALEXin (KEFLEX) 500 MG capsule Take 1 capsule (500 mg total) by mouth 2 (two) times daily. X 5 days to prevent infection with tethered stents. 10/20/20   Alexis Frock, MD  ketorolac (TORADOL) 10 MG tablet Take 1 tablet (10 mg total) by mouth every 8 (eight) hours as needed for moderate pain (or stent discomfort post-operatively). 10/20/20   Alexis Frock, MD  losartan-hydrochlorothiazide (HYZAAR) 100-25 MG tablet Take 1 tablet by mouth daily. 03/16/20   [provider]  metFORMIN (GLUCOPHAGE) 500 MG tablet Take 500 mg by mouth daily with breakfast.     [provider]  oxyCODONE-acetaminophen (PERCOCET) 5-325 MG tablet Take 1 tablet by mouth every 4 (four) hours as needed for severe pain. 10/20/20 10/20/21  Alexis Frock, MD  pantoprazole (PROTONIX) 40 MG tablet Take 1 tablet (40 mg total) by mouth daily. Patient taking differently: Take 40 mg by mouth as needed. 10/10/20 10/10/21  Charlynne Cousins, MD  Vitamin D, Ergocalciferol, (DRISDOL) 50000 units CAPS capsule Take 50,000 Units by mouth every Thursday. 08/06/17   [provider]    Family History Family History  Problem Relation Age of Onset   Esophageal cancer Maternal Aunt    Colon cancer Neg Hx    Stomach cancer Neg Hx    Rectal cancer Neg Hx     Social History Social History   Tobacco Use   Smoking status: Never   Smokeless tobacco: Never  Vaping Use   Vaping Use: Never used  Substance Use Topics   Alcohol use: No    Alcohol/week: 0.0 standard drinks   Drug use: No     Allergies   Patient has no known allergies.   Review of Systems Review of Systems Per HPI  Physical Exam Triage Vital Signs ED  Triage Vitals  Enc Vitals Group     BP 09/23/21 1057 (!) 151/93     Pulse Rate 09/23/21 1057 66     Resp 09/23/21 1057 18     Temp 09/23/21 1057 98 F (36.7 C)     Temp Source 09/23/21 1057 Oral     SpO2 09/23/21 1057 97 %     Weight --      Height --      Head Circumference --      Peak Flow --      Pain Score 09/23/21 1100 0     Pain Loc --      Pain Edu? --      Excl. in [place]? --    No data found.  Updated Vital Signs BP (!) 151/93 (BP Location: Left Arm) Comment: Bp meds taken approx 1hr before visit  Pulse  66   Temp 98 F (36.7 C) (Oral)   Resp 18   SpO2 97%   Visual Acuity Right Eye Distance:   Left Eye Distance:   Bilateral Distance:    Right Eye Near:   Left Eye Near:    Bilateral Near:     Physical Exam Constitutional:      General: She is not in acute distress.    Appearance: Normal appearance. She is not toxic-appearing or diaphoretic.  HENT:     Head: Normocephalic and atraumatic.     Right Ear: Tympanic membrane and ear canal normal.     Left Ear: Tympanic membrane and ear canal normal.     Nose: Congestion present.     Right Sinus: Frontal sinus tenderness present. No maxillary sinus tenderness.     Left Sinus: No maxillary sinus tenderness or frontal sinus tenderness.     Mouth/Throat:     Mouth: Mucous membranes are moist.     Pharynx: Posterior oropharyngeal erythema present.  Eyes:     Extraocular Movements: Extraocular movements intact.     Conjunctiva/sclera: Conjunctivae normal.     Pupils: Pupils are equal, round, and reactive to light.  Cardiovascular:     Rate and Rhythm: Normal rate and regular rhythm.     Pulses: Normal pulses.     Heart sounds: Normal heart sounds.  Pulmonary:     Effort: Pulmonary effort is normal. No respiratory distress.     Breath sounds: Normal breath sounds. No wheezing.  Abdominal:     General: Abdomen is flat. Bowel sounds are normal.     Palpations: Abdomen is soft.  Musculoskeletal:        General:  Normal range of motion.     Cervical back: Normal range of motion.  Lymphadenopathy:     Cervical: Cervical adenopathy present.     Right cervical: Superficial cervical adenopathy present.  Skin:    General: Skin is warm and dry.  Neurological:     General: No focal deficit present.     Mental Status: She is alert and oriented to person, place, and time. Mental status is at baseline.  Psychiatric:        Mood and Affect: Mood normal.        Behavior: Behavior normal.     UC Treatments / Results  Labs (all labs ordered are listed, but only abnormal results are displayed) Labs Reviewed  NOVEL CORONAVIRUS, NAA  CULTURE, GROUP A STREP [facility])  POCT RAPID STREP A (OFFICE)    EKG   Radiology No results found.  Procedures Procedures (including critical care time)  Medications Ordered in UC Medications - No data to display  Initial Impression / Assessment and Plan / UC Course  I have reviewed the triage vital signs and the nursing notes.  Pertinent labs & imaging results that were available during my care of the patient were reviewed by me and considered in my medical decision making (see chart for details).     Physical exam is consistent with acute sinusitis.  Will treat with Augmentin x7 days.  Rapid strep completed due to cervical lymphadenopathy.  Strep was negative.  Throat culture is pending.  COVID-19 test pending as well.  Discussed supportive care and symptom management with patient.  Do not think that chest imaging is necessary at this time given no adventitious lung sounds on exam.  Discussed strict return precautions.  Patient verbalized understanding and was agreeable with plan. Final Clinical Impressions(s) / UC Diagnoses  Final diagnoses:  Acute upper respiratory infection  Cervical lymphadenopathy     Discharge Instructions      Rapid strep test was negative.  Throat culture and COVID-19 viral swab are pending.  It appears that you may have a sinus  infection.  Augmentin has been prescribed to help treat this.    ED Prescriptions     Medication Sig Dispense Auth. Provider   amoxicillin-clavulanate (AUGMENTIN) 875-125 MG tablet Take 1 tablet by mouth every 12 (twelve) hours. 14 tablet [place], Michele Rockers, [place]      PDMP not reviewed this encounter.   Teodora Medici, [place] 09/23/21 1153

## 2021-09-24 LAB — NOVEL CORONAVIRUS, NAA: SARS-CoV-2, NAA: NOT DETECTED

## 2021-09-26 LAB — CULTURE, GROUP A STREP (THRC)

## 2022-02-13 ENCOUNTER — Ambulatory Visit (INDEPENDENT_AMBULATORY_CARE_PROVIDER_SITE_OTHER): Payer: Medicare (Managed Care) | Admitting: Orthopaedic Surgery

## 2022-02-13 VITALS — Ht 64.0 in | Wt 260.0 lb

## 2022-02-13 DIAGNOSIS — M1711 Unilateral primary osteoarthritis, right knee: Secondary | ICD-10-CM | POA: Diagnosis not present

## 2022-02-13 DIAGNOSIS — G8929 Other chronic pain: Secondary | ICD-10-CM

## 2022-02-13 DIAGNOSIS — M25561 Pain in right knee: Secondary | ICD-10-CM

## 2022-02-13 NOTE — Progress Notes (Signed)
The patient is well-known to me.  She has severe end-stage arthritis of her right knee that is been well-documented.  Actually replaced her left knee several years ago.  She has never had any issues with that left knee at all.  We have tried multiple injections in her right knee over the years with both steroids and hyaluronic acid.  She is worked on activity modification and weight loss.  She is diabetic but has a hemoglobin A1c below 7.  She is 71 years old.  At this point her right knee pain is daily and it is 10 out of 10.  It is detrimentally affecting her mobility, her quality of life, and her actives daily living.  She denies any acute change in her medical status.  She is interested at this point in right knee replacement surgery given the success of her left knee replacement and given the failure of conservative treatment for several years now.  Her left knee was replaced in 2018. ? ?Examination of her right knee shows varus malalignment.  There is severe tenderness throughout flexion and extension of the knee with mild effusion.  The knee is ligamentously stable but significantly painful especially on the medial compartment of the knee.  There is also patellofemoral crepitation. ? ?X-rays in 2020 one of her right knee show severe end-stage arthritis with bone-on-bone wear of the medial compartment and the patellofemoral joint and varus malalignment.  She has a well-seated left total knee arthroplasty in the left knee moves very well. ? ?At this point I agree with setting her up for a right knee replacement given the failure of conservative treatment combined with her x-ray findings and clinical exam findings of the right knee.  We will be in touch about scheduling the surgery.  She agrees with this treatment plan. ?

## 2022-03-29 ENCOUNTER — Other Ambulatory Visit: Payer: Self-pay | Admitting: Physician Assistant

## 2022-03-29 DIAGNOSIS — M1711 Unilateral primary osteoarthritis, right knee: Secondary | ICD-10-CM

## 2022-03-29 NOTE — Progress Notes (Addendum)
COVID Vaccine Completed:  Yes  Date of COVID positive in last 90 days:  No  PCP - Nolene Ebbs, MD (office note requested) Cardiologist - Eleonore Chiquito, MD  Chest x-ray - 01-06-22 [facility] EKG - 04-06-22 Epic Stress Test - N/A ECHO - 10-10-20 Epic Cardiac Cath - N/A Pacemaker/ICD device last checked: Spinal Cord Stimulator:  Bowel Prep - N/A  Sleep Study - N/A CPAP -   Fasting Blood Sugar - 118 Checks Blood Sugar - checks every two months or so  Blood Thinner Instructions: Aspirin Instructions: ASA 81.  Stop x1 week, patient aware Last Dose:  Activity level:   Can go up a flight of stairs and perform activities of daily living without stopping and without symptoms of chest pain or shortness of breath.  Some limitations due to joint pain  Anesthesia review:  Hx of chest pain and L arm pain  evaluated by cardiology   DM. HTN,. CKD.    Hgb 9.5 on PAT labs  BP elevated at PAT 184/82 and on recheck 172/84.  Paient denied chest pain, headache or SOB.  She does monitor at home and says systolic typically runs in the 150 range.  Patient denies shortness of breath, fever, cough and chest pain at PAT appointment  Patient verbalized understanding of instructions that were given to them at the PAT appointment. Patient was also instructed that they will need to review over the PAT instructions again at home before surgery.

## 2022-03-29 NOTE — Patient Instructions (Addendum)
DUE TO COVID-19 ONLY TWO VISITORS  (aged 73 and older)  IS ALLOWED TO COME WITH YOU AND STAY IN THE WAITING ROOM ONLY DURING PRE OP AND PROCEDURE.   **NO VISITORS ARE ALLOWED IN THE SHORT STAY AREA OR RECOVERY ROOM!!**  IF YOU WILL BE ADMITTED INTO THE HOSPITAL YOU ARE ALLOWED ONLY FOUR SUPPORT PEOPLE DURING VISITATION HOURS ONLY (7 AM -8PM)   The support person(s) must pass our screening, gel in and out Visitors GUEST BADGE MUST BE WORN VISIBLY  One adult visitor may remain with you overnight and MUST be in the room by 8 P.M.   You are not required to [employer] often Do NOT share personal items Notify your provider if you are in close contact with someone who has COVID or you develop fever 100.4 or greater, new onset of sneezing, cough, sore throat, shortness of breath or body aches.        Your procedure is scheduled on:  04-14-22   Report to [place] Entrance    Report to admitting at 5:30 AM   Call this number if you have problems the morning of surgery [phone]   Do not eat food :After Midnight.   After Midnight you may have the following liquids until 5:15 AM DAY OF SURGERY  Clear Liquid Diet Water Black Coffee (sugar ok, NO MILK/CREAM OR CREAMERS)  Tea (sugar ok, NO MILK/CREAM OR CREAMERS) regular and decaf                             Plain Jell-O (NO RED)                                           Fruit ices (not with fruit pulp, NO RED)                                     Popsicles (NO RED)                                                                  Juice: apple, WHITE grape, WHITE cranberry Sports drinks like Gatorade (NO RED) Clear broth(vegetable,chicken,beef)                   The day of surgery:  Drink ONE (1) Pre-Surgery  G2 at 5:15 AM the morning of surgery. Drink in one sitting. Do not sip.  This drink was given to you during your hospital  pre-op appointment visit. Nothing else to drink after completing the  Pre-Surgery G2.          If you have questions, please contact your surgeon's office.   FOLLOW  ANY ADDITIONAL PRE OP INSTRUCTIONS YOU RECEIVED FROM YOUR SURGEON'S OFFICE!!!     Oral Hygiene is also important to reduce your risk of infection.                                    Remember -  BRUSH YOUR TEETH THE MORNING OF SURGERY WITH YOUR REGULAR TOOTHPASTE   Do NOT smoke after Midnight   Take these medicines the morning of surgery with A SIP OF WATER: Allopurinol  DO NOT [place]. PHARMACY WILL DISPENSE MEDICATIONS LISTED ON YOUR MEDICATION LIST TO YOU DURING YOUR ADMISSION [place]!  How to Manage Your Diabetes Before and After Surgery  Why is it important to control my blood sugar before and after surgery? Improving blood sugar levels before and after surgery helps healing and can limit problems. A way of improving blood sugar control is eating a healthy diet by:  Eating less sugar and carbohydrates  Increasing activity/exercise  Talking with your doctor about reaching your blood sugar goals High blood sugars (greater than 180 mg/dL) can raise your risk of infections and slow your recovery, so you will need to focus on controlling your diabetes during the weeks before surgery. Make sure that the doctor who takes care of your diabetes knows about your planned surgery including the date and location.  How do I manage my blood sugar before surgery? Check your blood sugar at least 4 times a day, starting 2 days before surgery, to make sure that the level is not too high or low. Check your blood sugar the morning of your surgery when you wake up and every 2 hours until you get to the Short Stay unit. If your blood sugar is less than 70 mg/dL, you will need to treat for low blood sugar: Do not take insulin. Treat a low blood sugar (less than 70 mg/dL) with  cup of clear juice (cranberry or apple), 4 glucose tablets, OR glucose gel. Recheck  blood sugar in 15 minutes after treatment (to make sure it is greater than 70 mg/dL). If your blood sugar is not greater than 70 mg/dL on recheck, call [phone] for further instructions. Report your blood sugar to the short stay nurse when you get to Short Stay.  If you are admitted to the hospital after surgery: Your blood sugar will be checked by the staff and you will probably be given insulin after surgery (instead of oral diabetes medicines) to make sure you have good blood sugar levels. The goal for blood sugar control after surgery is 80-180 mg/dL.   WHAT DO I DO ABOUT MY DIABETES MEDICATION?  Do not take oral diabetes medicines (pills) the morning of surgery.  THE DAY BEFORE SURGERY:  Take Metformin as prescribed   THE MORNING OF SURGERY:  Do not take Metformin .  Reviewed and Endorsed by [facility] Patient Education Committee, August 2015                               You may not have any metal on your body including hair pins, jewelry, and body piercing             Do not wear make-up, lotions, powders, perfumes or deodorant  Do not wear nail polish including gel and S&S, artificial/acrylic nails, or any other type of covering on natural nails including finger and toenails. If you have artificial nails, gel coating, etc. that needs to be removed by a nail salon please have this removed prior to surgery or surgery may need to be canceled/ delayed if the surgeon/ anesthesia feels like they are unable to be safely monitored.   Do not shave  48 hours prior to surgery.  Contacts, dentures or bridgework may not be worn into surgery.   Bring small overnight bag day of surgery.  Do not bring valuables to the hospital. [place].  Please read over the following fact sheets you were given: IF YOU HAVE QUESTIONS ABOUT YOUR PRE-OP INSTRUCTIONS PLEASE CALL [place] - Preparing for Surgery Before surgery, you can  play an important role.  Because skin is not sterile, your skin needs to be as free of germs as possible.  You can reduce the number of germs on your skin by washing with CHG (chlorahexidine gluconate) soap before surgery.  CHG is an antiseptic cleaner which kills germs and bonds with the skin to continue killing germs even after washing. Please DO NOT use if you have an allergy to CHG or antibacterial soaps.  If your skin becomes reddened/irritated stop using the CHG and inform your nurse when you arrive at Short Stay. Do not shave (including legs and underarms) for at least 48 hours prior to the first CHG shower.  You may shave your face/neck.  Please follow these instructions carefully:  1.  Shower with CHG Soap the night before surgery and the  morning of surgery.  2.  If you choose to wash your hair, wash your hair first as usual with your normal  shampoo.  3.  After you shampoo, rinse your hair and body thoroughly to remove the shampoo.                             4.  Use CHG as you would any other liquid soap.  You can apply chg directly to the skin and wash.  Gently with a scrungie or clean washcloth.  5.  Apply the CHG Soap to your body ONLY FROM THE NECK DOWN.   Do   not use on face/ open                           Wound or open sores. Avoid contact with eyes, ears mouth and   genitals (private parts).                       Wash face,  Genitals (private parts) with your normal soap.             6.  Wash thoroughly, paying special attention to the area where your    surgery  will be performed.  7.  Thoroughly rinse your body with warm water from the neck down.  8.  DO NOT shower/wash with your normal soap after using and rinsing off the CHG Soap.                9.  Pat yourself dry with a clean towel.            10.  Wear clean pajamas.            11.  Place clean sheets on your bed the night of your first shower and do not  sleep with pets. Day of Surgery : Do not apply any  lotions/deodorants the morning of surgery.  Please wear clean clothes to the hospital/surgery center.  FAILURE TO FOLLOW THESE INSTRUCTIONS MAY RESULT IN THE CANCELLATION OF YOUR SURGERY  PATIENT SIGNATURE_________________________________  NURSE SIGNATURE__________________________________  ________________________________________________________________________     Adam Phenix  An incentive spirometer is  a tool that can help keep your lungs clear and active. This tool measures how well you are filling your lungs with each breath. Taking long deep breaths may help reverse or decrease the chance of developing breathing (pulmonary) problems (especially infection) following: A long period of time when you are unable to move or be active. BEFORE THE PROCEDURE  If the spirometer includes an indicator to show your best effort, your nurse or respiratory therapist will set it to a desired goal. If possible, sit up straight or lean slightly forward. Try not to slouch. Hold the incentive spirometer in an upright position. INSTRUCTIONS FOR USE  Sit on the edge of your bed if possible, or sit up as far as you can in bed or on a chair. Hold the incentive spirometer in an upright position. Breathe out normally. Place the mouthpiece in your mouth and seal your lips tightly around it. Breathe in slowly and as deeply as possible, raising the piston or the ball toward the top of the column. Hold your breath for 3-5 seconds or for as long as possible. Allow the piston or ball to fall to the bottom of the column. Remove the mouthpiece from your mouth and breathe out normally. Rest for a few seconds and repeat Steps 1 through 7 at least 10 times every 1-2 hours when you are awake. Take your time and take a few normal breaths between deep breaths. The spirometer may include an indicator to show your best effort. Use the indicator as a goal to work toward during each repetition. After each set of 10  deep breaths, practice coughing to be sure your lungs are clear. If you have an incision (the cut made at the time of surgery), support your incision when coughing by placing a pillow or rolled up towels firmly against it. Once you are able to get out of bed, walk around indoors and cough well. You may stop using the incentive spirometer when instructed by your caregiver.  RISKS AND COMPLICATIONS Take your time so you do not get dizzy or light-headed. If you are in pain, you may need to take or ask for pain medication before doing incentive spirometry. It is harder to take a deep breath if you are having pain. AFTER USE Rest and breathe slowly and easily. It can be helpful to keep track of a log of your progress. Your caregiver can provide you with a simple table to help with this. If you are using the spirometer at home, follow these instructions: [place] IF:  You are having difficultly using the spirometer. You have trouble using the spirometer as often as instructed. Your pain medication is not giving enough relief while using the spirometer. You develop fever of 100.5 F (38.1 C) or higher. SEEK IMMEDIATE MEDICAL CARE IF:  You cough up bloody sputum that had not been present before. You develop fever of 102 F (38.9 C) or greater. You develop worsening pain at or near the incision site. MAKE SURE YOU:  Understand these instructions. Will watch your condition. Will get help right away if you are not doing well or get worse. Document Released: 02/19/2007 Document Revised: 01/01/2012 Document Reviewed: 04/22/2007 [facility] Patient Information 2014 [place], [place].   ________________________________________________________________________

## 2022-04-06 ENCOUNTER — Encounter (HOSPITAL_COMMUNITY): Payer: Self-pay

## 2022-04-06 ENCOUNTER — Encounter (HOSPITAL_COMMUNITY)
Admission: RE | Admit: 2022-04-06 | Discharge: 2022-04-06 | Disposition: A | Discharge status: Home / Self Care | Payer: Medicare (Managed Care) | Source: Ambulatory Visit | Attending: Orthopaedic Surgery | Admitting: Orthopaedic Surgery

## 2022-04-06 ENCOUNTER — Other Ambulatory Visit: Payer: Self-pay

## 2022-04-06 VITALS — BP 172/84 | HR 69 | Temp 98.3°F | Resp 20 | Ht 64.0 in | Wt 257.4 lb

## 2022-04-06 DIAGNOSIS — M1711 Unilateral primary osteoarthritis, right knee: Secondary | ICD-10-CM | POA: Diagnosis not present

## 2022-04-06 DIAGNOSIS — I2510 Atherosclerotic heart disease of native coronary artery without angina pectoris: Secondary | ICD-10-CM | POA: Insufficient documentation

## 2022-04-06 DIAGNOSIS — E119 Type 2 diabetes mellitus without complications: Secondary | ICD-10-CM | POA: Diagnosis not present

## 2022-04-06 DIAGNOSIS — Z01818 Encounter for other preprocedural examination: Secondary | ICD-10-CM | POA: Diagnosis present

## 2022-04-06 LAB — BASIC METABOLIC PANEL WITH GFR
Anion gap: 6 (ref 5–15)
BUN: 27 mg/dL — ABNORMAL HIGH (ref 8–23)
CO2: 25 mmol/L (ref 22–32)
Calcium: 9 mg/dL (ref 8.9–10.3)
Chloride: 109 mmol/L (ref 98–111)
Creatinine, Ser: 1.17 mg/dL — ABNORMAL HIGH (ref 0.44–1.00)
GFR, Estimated: 50 mL/min — ABNORMAL LOW
Glucose, Bld: 103 mg/dL — ABNORMAL HIGH (ref 70–99)
Potassium: 4.2 mmol/L (ref 3.5–5.1)
Sodium: 140 mmol/L (ref 135–145)

## 2022-04-06 LAB — CBC
HCT: 31.1 % — ABNORMAL LOW (ref 36.0–46.0)
Hemoglobin: 9.5 g/dL — ABNORMAL LOW (ref 12.0–15.0)
MCH: 27.8 pg (ref 26.0–34.0)
MCHC: 30.5 g/dL (ref 30.0–36.0)
MCV: 90.9 fL (ref 80.0–100.0)
Platelets: 325 10*3/uL (ref 150–400)
RBC: 3.42 MIL/uL — ABNORMAL LOW (ref 3.87–5.11)
RDW: 14.2 % (ref 11.5–15.5)
WBC: 8.3 10*3/uL (ref 4.0–10.5)
nRBC: 0 % (ref 0.0–0.2)

## 2022-04-06 LAB — HEMOGLOBIN A1C
Hgb A1c MFr Bld: 6 % — ABNORMAL HIGH (ref 4.8–5.6)
Mean Plasma Glucose: 125.5 mg/dL

## 2022-04-06 LAB — SURGICAL PCR SCREEN
MRSA, PCR: NEGATIVE
Staphylococcus aureus: NEGATIVE

## 2022-04-06 LAB — GLUCOSE, CAPILLARY: Glucose-Capillary: 103 mg/dL — ABNORMAL HIGH (ref 70–99)

## 2022-04-13 NOTE — Anesthesia Preprocedure Evaluation (Signed)
Anesthesia Evaluation  Patient identified by MRN, date of birth, ID band Patient awake    Reviewed: Allergy & Precautions, NPO status , Patient's Chart, lab work & pertinent test results  History of Anesthesia Complications Negative for: history of anesthetic complications  Airway Mallampati: II  TM Distance: >3 FB Neck ROM: Full    Dental  (+) Partial Upper   Pulmonary neg pulmonary ROS,    Pulmonary exam normal        Cardiovascular hypertension, Pt. on medications Normal cardiovascular exam     Neuro/Psych negative neurological ROS  negative psych ROS   GI/Hepatic Neg liver ROS, GERD  Medicated and Controlled,  Endo/Other  diabetes, Type 2, Oral Hypoglycemic AgentsMorbid obesity  Renal/GU Renal InsufficiencyRenal diseaseRenal ca  negative genitourinary   Musculoskeletal  (+) Arthritis ,   Abdominal   Peds  Hematology  (+) Blood dyscrasia, anemia , Hgb 8.6   Anesthesia Other Findings Day of surgery medications reviewed with patient.  Reproductive/Obstetrics negative OB ROS                             Anesthesia Physical  Anesthesia Plan  ASA: 3  Anesthesia Plan: Spinal and MAC   Post-op Pain Management: Minimal or no pain anticipated   Induction: Intravenous  PONV Risk Score and Plan: 3 and Treatment may vary due to age or medical condition, Ondansetron and Propofol infusion  Airway Management Planned: Natural Airway and Nasal Cannula  Additional Equipment: None  Intra-op Plan:   Post-operative Plan: Extubation in OR  Informed Consent: I have reviewed the patients History and Physical, chart, labs and discussed the procedure including the risks, benefits and alternatives for the proposed anesthesia with the patient or authorized representative who has indicated his/her understanding and acceptance.     Dental advisory given  Plan Discussed with: CRNA and  Anesthesiologist  Anesthesia Plan Comments: (See PAT note 10/18/2020, Konrad Felix, PA-C)        Anesthesia Quick Evaluation

## 2022-04-13 NOTE — H&P (Signed)
TOTAL KNEE ADMISSION H&P  Patient is being admitted for right total knee arthroplasty.  Subjective:  Chief Complaint:right knee pain.  HPI: Debbie Velasquez, 71 y.o. female, has a history of pain and functional disability in the right knee due to arthritis and has failed non-surgical conservative treatments for greater than 12 weeks to includeNSAID's and/or analgesics, corticosteriod injections, viscosupplementation injections, use of assistive devices, weight reduction as appropriate, and activity modification.  Onset of symptoms was gradual, starting 3 years ago with gradually worsening course since that time. The patient noted no past surgery on the right knee(s).  Patient currently rates pain in the right knee(s) at 10 out of 10 with activity. Patient has night pain, worsening of pain with activity and weight bearing, pain that interferes with activities of daily living, pain with passive range of motion, crepitus, and joint swelling.  Patient has evidence of subchondral sclerosis, periarticular osteophytes, and joint space narrowing by imaging studies. There is no active infection.  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    Hx of seasonal allergies    rare flare ups   Hypertension     Left renal mass    Obesity    Pneumonia    hx of 20 years ago   Right ureteral stone    Type 2 diabetes mellitus ([place])    Type 2   Wears glasses    Wears partial dentures    upper    Past Surgical History:  Procedure Laterality Date   CESAREAN SECTION  yrs ago   CYSTOSCOPY WITH RETROGRADE PYELOGRAM, URETEROSCOPY AND STENT PLACEMENT Left 05/24/2018   Procedure: CYSTOSCOPY WITH RETROGRADE PYELOGRAM, URETEROSCOPY AND STENT PLACEMENT;  Surgeon: Alexis Frock, MD;  Location: [facility];  Service: Urology;  Laterality: Left;   CYSTOSCOPY WITH RETROGRADE PYELOGRAM, URETEROSCOPY AND STENT PLACEMENT Left 02/27/2020   Procedure: CYSTOSCOPY WITH RETROGRADE PYELOGRAM, URETEROSCOPY AND STENT PLACEMENT;  Surgeon: Alexis Frock, MD;  Location: [facility];  Service: Urology;  Laterality: Left;  1 HR   CYSTOSCOPY WITH RETROGRADE PYELOGRAM, URETEROSCOPY AND STENT PLACEMENT Bilateral 10/20/2020   Procedure: CYSTOSCOPY WITH RETROGRADE PYELOGRAM, URETEROSCOPY AND STENT PLACEMENT;  Surgeon: Alexis Frock, MD;  Location: [facility];  Service: Urology;  Laterality: Bilateral;  75 MINS   HOLMIUM LASER APPLICATION Left 05/27/1659   Procedure: HOLMIUM LASER APPLICATION;  Surgeon: Alexis Frock, MD;  Location: [facility];  Service: Urology;  Laterality: Left;   HOLMIUM LASER APPLICATION Left 03/26/159   Procedure: HOLMIUM LASER APPLICATION;  Surgeon: Alexis Frock, MD;  Location: [facility];  Service: Urology;  Laterality: Left;   HOLMIUM LASER APPLICATION Bilateral 10/93/2355   Procedure: HOLMIUM LASER APPLICATION;  Surgeon: Alexis Frock, MD;  Location: [facility];  Service: Urology;  Laterality: Bilateral;   ROBOTIC ASSITED PARTIAL NEPHRECTOMY Left 12/22/2015   Procedure: XI ROBOTIC ASSITED PARTIAL NEPHRECTOMY;  Surgeon: Alexis Frock, MD;  Location: [facility];  Service: Urology;  Laterality: Left;   TOTAL ABDOMINAL HYSTERECTOMY W/ BILATERAL SALPINGOOPHORECTOMY  1980's   TOTAL KNEE ARTHROPLASTY Left 01/12/2017   Procedure: LEFT  TOTAL KNEE ARTHROPLASTY and Right knee steroid injection;  Surgeon: Mcarthur Rossetti, MD;  Location: [facility];  Service: Orthopedics;  Laterality: Left;    No current facility-administered medications for this encounter.   Current Outpatient Medications  Medication Sig Dispense Refill Last Dose   allopurinol (ZYLOPRIM) 300 MG tablet Take 300 mg by mouth daily.      aspirin EC 81 MG tablet Take 81 mg by mouth daily. Swallow whole.      losartan-hydrochlorothiazide (HYZAAR) 100-25 MG tablet Take 1 tablet by mouth daily.      metFORMIN (GLUCOPHAGE) 500 MG tablet Take 500 mg by mouth daily with breakfast.       Turmeric 400 MG CAPS Take 400 mg by mouth daily.      Vitamin D, Ergocalciferol, (DRISDOL) 50000 units CAPS capsule Take 50,000 Units by mouth every Thursday.  5    No Known Allergies  Social History   Tobacco Use   Smoking status: Never   Smokeless tobacco: Never  Substance Use Topics   Alcohol use: No    Alcohol/week: 0.0 standard drinks of alcohol    Family History  Problem Relation Age of Onset   Esophageal cancer Maternal Aunt    Colon cancer Neg Hx    Stomach cancer Neg Hx    Rectal cancer Neg Hx      Review of Systems  Musculoskeletal:  Positive for gait problem and joint swelling.  All other systems reviewed and are negative.   Objective:  Physical Exam Vitals reviewed.  Constitutional:      Appearance: Normal appearance.  HENT:     Head: Normocephalic and atraumatic.  Eyes:     Extraocular Movements: Extraocular movements intact.     Pupils: Pupils are equal, round, and reactive to light.  Cardiovascular:     Rate and Rhythm: Normal rate and regular rhythm.  Pulmonary:     Effort: Pulmonary effort is normal.     Breath sounds: Normal breath sounds.  Abdominal:     Palpations: Abdomen is soft.  Musculoskeletal:     Cervical back: Normal range of motion and neck supple.     Right knee: Effusion, bony tenderness and crepitus present. Decreased  range of motion. Tenderness present over the medial joint line, lateral joint line and patellar tendon. Abnormal alignment.  Neurological:     Mental Status: She is alert and oriented to person, place, and time.  Psychiatric:        Behavior: Behavior normal.     Vital signs in last 24 hours:    Labs:   Estimated body mass index is 44.18 kg/m as calculated from the following:   Height as of 04/06/22: '5\' 4"'$  (1.626 m).   Weight as of 04/06/22: 116.8 kg.   Imaging Review Plain radiographs demonstrate severe degenerative joint disease of the right knee(s). The overall alignment ismild varus. The bone quality appears to be good for age and reported activity level.      Assessment/Plan:  End stage arthritis, right knee   The patient history, physical examination, clinical judgment of the provider and imaging studies are consistent with end stage degenerative joint disease of  the right knee(s) and total knee arthroplasty is deemed medically necessary. The treatment options including medical management, injection therapy arthroscopy and arthroplasty were discussed at length. The risks and benefits of total knee arthroplasty were presented and reviewed. The risks due to aseptic loosening, infection, stiffness, patella tracking problems, thromboembolic complications and other imponderables were discussed. The patient acknowledged the explanation, agreed to proceed with the plan and consent was signed. Patient is being admitted for inpatient treatment for surgery, pain control, PT, OT, prophylactic antibiotics, VTE prophylaxis, progressive ambulation and ADL's and discharge planning. The patient is planning to be discharged home with home health services

## 2022-04-14 ENCOUNTER — Ambulatory Visit (HOSPITAL_COMMUNITY): Payer: Medicare (Managed Care) | Admitting: Physician Assistant

## 2022-04-14 ENCOUNTER — Encounter (HOSPITAL_COMMUNITY)
Admission: RE | Disposition: A | Discharge status: Home / Self Care | Payer: Self-pay | Source: Home / Self Care | Attending: Orthopaedic Surgery

## 2022-04-14 ENCOUNTER — Ambulatory Visit (HOSPITAL_BASED_OUTPATIENT_CLINIC_OR_DEPARTMENT_OTHER): Payer: Medicare (Managed Care) | Admitting: Certified Registered Nurse Anesthetist

## 2022-04-14 ENCOUNTER — Other Ambulatory Visit: Payer: Self-pay

## 2022-04-14 ENCOUNTER — Observation Stay (HOSPITAL_COMMUNITY): Payer: Medicare (Managed Care)

## 2022-04-14 ENCOUNTER — Observation Stay (HOSPITAL_COMMUNITY)
Admission: RE | Admit: 2022-04-14 | Discharge: 2022-04-17 | Disposition: A | Discharge status: Home / Self Care | Payer: Medicare (Managed Care) | Attending: Orthopaedic Surgery | Admitting: Orthopaedic Surgery

## 2022-04-14 ENCOUNTER — Encounter (HOSPITAL_COMMUNITY): Payer: Self-pay | Admitting: Orthopaedic Surgery

## 2022-04-14 DIAGNOSIS — M1711 Unilateral primary osteoarthritis, right knee: Secondary | ICD-10-CM | POA: Diagnosis not present

## 2022-04-14 DIAGNOSIS — Z7982 Long term (current) use of aspirin: Secondary | ICD-10-CM | POA: Diagnosis not present

## 2022-04-14 DIAGNOSIS — I10 Essential (primary) hypertension: Secondary | ICD-10-CM | POA: Insufficient documentation

## 2022-04-14 DIAGNOSIS — Z79899 Other long term (current) drug therapy: Secondary | ICD-10-CM | POA: Insufficient documentation

## 2022-04-14 DIAGNOSIS — Z96651 Presence of right artificial knee joint: Secondary | ICD-10-CM

## 2022-04-14 DIAGNOSIS — Z85528 Personal history of other malignant neoplasm of kidney: Secondary | ICD-10-CM | POA: Diagnosis not present

## 2022-04-14 DIAGNOSIS — Z7984 Long term (current) use of oral hypoglycemic drugs: Secondary | ICD-10-CM | POA: Insufficient documentation

## 2022-04-14 DIAGNOSIS — Z96652 Presence of left artificial knee joint: Secondary | ICD-10-CM | POA: Insufficient documentation

## 2022-04-14 DIAGNOSIS — E119 Type 2 diabetes mellitus without complications: Secondary | ICD-10-CM | POA: Insufficient documentation

## 2022-04-14 HISTORY — PX: TOTAL KNEE ARTHROPLASTY: SHX125

## 2022-04-14 LAB — TYPE AND SCREEN
ABO/RH(D): B POS
Antibody Screen: NEGATIVE

## 2022-04-14 LAB — GLUCOSE, CAPILLARY
Glucose-Capillary: 137 mg/dL — ABNORMAL HIGH (ref 70–99)
Glucose-Capillary: 125 mg/dL — ABNORMAL HIGH (ref 70–99)
Glucose-Capillary: 203 mg/dL — ABNORMAL HIGH (ref 70–99)
Glucose-Capillary: 187 mg/dL — ABNORMAL HIGH (ref 70–99)

## 2022-04-14 SURGERY — ARTHROPLASTY, KNEE, TOTAL
Anesthesia: Monitor Anesthesia Care | Site: Knee | Laterality: Right

## 2022-04-14 MED ORDER — ONDANSETRON HCL 4 MG/2ML IJ SOLN
INTRAMUSCULAR | Status: DC | PRN
  Administered 2022-04-14: 4 mg via INTRAVENOUS

## 2022-04-14 MED ORDER — ACETAMINOPHEN 160 MG/5ML PO SOLN: 325.0000 mg | ORAL | Status: DC | PRN

## 2022-04-14 MED ORDER — HYDROMORPHONE HCL 1 MG/ML IJ SOLN: 0.5000 mg | INTRAMUSCULAR | Status: DC | PRN

## 2022-04-14 MED ORDER — LOSARTAN POTASSIUM-HCTZ 100-25 MG PO TABS
1.0000 | ORAL_TABLET | Freq: Every day | ORAL | Status: DC
Start: 2022-04-14 — End: 2022-04-14

## 2022-04-14 MED ORDER — GLYCOPYRROLATE 0.2 MG/ML IJ SOLN
INTRAMUSCULAR | Status: AC
  Filled 2022-04-14: qty 1

## 2022-04-14 MED ORDER — PROPOFOL 1000 MG/100ML IV EMUL
INTRAVENOUS | Status: AC
  Filled 2022-04-14: qty 100

## 2022-04-14 MED ORDER — PROPOFOL 500 MG/50ML IV EMUL
INTRAVENOUS | Status: DC | PRN
  Administered 2022-04-14: 70 ug/kg/min via INTRAVENOUS

## 2022-04-14 MED ORDER — PANTOPRAZOLE SODIUM 40 MG PO TBEC
40.0000 mg | DELAYED_RELEASE_TABLET | Freq: Every day | ORAL | Status: DC
  Administered 2022-04-14 – 2022-04-17 (×4): 40 mg via ORAL
  Filled 2022-04-14 (×4): qty 1

## 2022-04-14 MED ORDER — ONDANSETRON HCL 4 MG/2ML IJ SOLN: 4.0000 mg | Freq: Once | INTRAMUSCULAR | Status: DC | PRN

## 2022-04-14 MED ORDER — FENTANYL CITRATE PF 50 MCG/ML IJ SOSY: 50.0000 ug | PREFILLED_SYRINGE | INTRAMUSCULAR | Status: DC

## 2022-04-14 MED ORDER — MENTHOL 3 MG MT LOZG: 1.0000 | LOZENGE | OROMUCOSAL | Status: DC | PRN

## 2022-04-14 MED ORDER — LACTATED RINGERS IV SOLN: INTRAVENOUS | Status: DC

## 2022-04-14 MED ORDER — OXYCODONE HCL 5 MG/5ML PO SOLN: 5.0000 mg | Freq: Once | ORAL | Status: DC | PRN

## 2022-04-14 MED ORDER — MEPERIDINE HCL 50 MG/ML IJ SOLN: 6.2500 mg | INTRAMUSCULAR | Status: DC | PRN

## 2022-04-14 MED ORDER — METFORMIN HCL 500 MG PO TABS
500.0000 mg | ORAL_TABLET | Freq: Every day | ORAL | Status: DC
  Administered 2022-04-15 – 2022-04-17 (×3): 500 mg via ORAL
  Filled 2022-04-14 (×3): qty 1

## 2022-04-14 MED ORDER — BUPIVACAINE-EPINEPHRINE (PF) 0.25% -1:200000 IJ SOLN
INTRAMUSCULAR | Status: AC
  Filled 2022-04-14: qty 30

## 2022-04-14 MED ORDER — FENTANYL CITRATE PF 50 MCG/ML IJ SOSY
PREFILLED_SYRINGE | INTRAMUSCULAR | Status: AC
  Administered 2022-04-14: 50 ug via INTRAVENOUS
  Filled 2022-04-14: qty 2

## 2022-04-14 MED ORDER — ACETAMINOPHEN 325 MG PO TABS: 325.0000 mg | ORAL_TABLET | ORAL | Status: DC | PRN

## 2022-04-14 MED ORDER — ACETAMINOPHEN 325 MG PO TABS
325.0000 mg | ORAL_TABLET | Freq: Four times a day (QID) | ORAL | Status: DC | PRN
  Administered 2022-04-15 – 2022-04-17 (×4): 650 mg via ORAL
  Filled 2022-04-14 (×4): qty 2

## 2022-04-14 MED ORDER — INSULIN ASPART 100 UNIT/ML IJ SOLN: 0.0000 [IU] | Freq: Every day | INTRAMUSCULAR | Status: DC

## 2022-04-14 MED ORDER — ROPIVACAINE HCL 7.5 MG/ML IJ SOLN
INTRAMUSCULAR | Status: DC | PRN
  Administered 2022-04-14: 30 mL via PERINEURAL

## 2022-04-14 MED ORDER — DOCUSATE SODIUM 100 MG PO CAPS
100.0000 mg | ORAL_CAPSULE | Freq: Two times a day (BID) | ORAL | Status: DC
  Administered 2022-04-14 – 2022-04-17 (×6): 100 mg via ORAL
  Filled 2022-04-14 (×6): qty 1

## 2022-04-14 MED ORDER — METHOCARBAMOL 500 MG PO TABS
500.0000 mg | ORAL_TABLET | Freq: Four times a day (QID) | ORAL | Status: DC | PRN
  Administered 2022-04-14 – 2022-04-15 (×3): 500 mg via ORAL
  Filled 2022-04-14 (×4): qty 1

## 2022-04-14 MED ORDER — SODIUM CHLORIDE 0.9 % IV SOLN: INTRAVENOUS | Status: DC

## 2022-04-14 MED ORDER — BUPIVACAINE-EPINEPHRINE 0.25% -1:200000 IJ SOLN
INTRAMUSCULAR | Status: DC | PRN
  Administered 2022-04-14: 30 mL

## 2022-04-14 MED ORDER — ORAL CARE MOUTH RINSE: 15.0000 mL | Freq: Once | OROMUCOSAL | Status: AC

## 2022-04-14 MED ORDER — METOCLOPRAMIDE HCL 5 MG PO TABS: 5.0000 mg | ORAL_TABLET | Freq: Three times a day (TID) | ORAL | Status: DC | PRN

## 2022-04-14 MED ORDER — CEFAZOLIN SODIUM-DEXTROSE 2-4 GM/100ML-% IV SOLN
2.0000 g | INTRAVENOUS | Status: AC
  Administered 2022-04-14: 2 g via INTRAVENOUS
  Filled 2022-04-14: qty 100

## 2022-04-14 MED ORDER — METHOCARBAMOL 500 MG IVPB - SIMPLE MED
INTRAVENOUS | Status: AC
  Filled 2022-04-14: qty 50

## 2022-04-14 MED ORDER — PROPOFOL 10 MG/ML IV BOLUS
INTRAVENOUS | Status: AC
  Filled 2022-04-14: qty 20

## 2022-04-14 MED ORDER — ASPIRIN 81 MG PO CHEW
81.0000 mg | CHEWABLE_TABLET | Freq: Two times a day (BID) | ORAL | Status: DC
  Administered 2022-04-14 – 2022-04-17 (×6): 81 mg via ORAL
  Filled 2022-04-14 (×6): qty 1

## 2022-04-14 MED ORDER — METHOCARBAMOL 500 MG IVPB - SIMPLE MED
500.0000 mg | Freq: Four times a day (QID) | INTRAVENOUS | Status: DC | PRN
  Administered 2022-04-14: 500 mg via INTRAVENOUS

## 2022-04-14 MED ORDER — PHENYLEPHRINE HCL-NACL 20-0.9 MG/250ML-% IV SOLN
INTRAVENOUS | Status: DC | PRN
  Administered 2022-04-14: 40 ug/min via INTRAVENOUS

## 2022-04-14 MED ORDER — CHLORHEXIDINE GLUCONATE 0.12 % MT SOLN
15.0000 mL | Freq: Once | OROMUCOSAL | Status: AC
  Administered 2022-04-14: 15 mL via OROMUCOSAL

## 2022-04-14 MED ORDER — ONDANSETRON HCL 4 MG/2ML IJ SOLN
INTRAMUSCULAR | Status: AC
  Filled 2022-04-14: qty 2

## 2022-04-14 MED ORDER — POVIDONE-IODINE 10 % EX SWAB
2.0000 | Freq: Once | CUTANEOUS | Status: AC
  Administered 2022-04-14: 2 via TOPICAL

## 2022-04-14 MED ORDER — INSULIN ASPART 100 UNIT/ML IJ SOLN
0.0000 [IU] | Freq: Three times a day (TID) | INTRAMUSCULAR | Status: DC
  Administered 2022-04-15: 2 [IU] via SUBCUTANEOUS

## 2022-04-14 MED ORDER — DEXAMETHASONE SODIUM PHOSPHATE 10 MG/ML IJ SOLN
INTRAMUSCULAR | Status: AC
  Filled 2022-04-14: qty 1

## 2022-04-14 MED ORDER — METOCLOPRAMIDE HCL 5 MG/ML IJ SOLN: 5.0000 mg | Freq: Three times a day (TID) | INTRAMUSCULAR | Status: DC | PRN

## 2022-04-14 MED ORDER — HYDROMORPHONE HCL 2 MG PO TABS
2.0000 mg | ORAL_TABLET | ORAL | Status: DC | PRN
  Administered 2022-04-14: 3 mg via ORAL
  Administered 2022-04-14 – 2022-04-16 (×5): 2 mg via ORAL
  Filled 2022-04-14 (×3): qty 1
  Filled 2022-04-14: qty 2
  Filled 2022-04-14 (×2): qty 1

## 2022-04-14 MED ORDER — ONDANSETRON HCL 4 MG/2ML IJ SOLN: 4.0000 mg | Freq: Four times a day (QID) | INTRAMUSCULAR | Status: DC | PRN

## 2022-04-14 MED ORDER — OXYCODONE HCL 5 MG PO TABS: 5.0000 mg | ORAL_TABLET | Freq: Once | ORAL | Status: DC | PRN

## 2022-04-14 MED ORDER — SODIUM CHLORIDE 0.9 % IR SOLN
Status: DC | PRN
  Administered 2022-04-14: 1000 mL

## 2022-04-14 MED ORDER — PROPOFOL 10 MG/ML IV BOLUS
INTRAVENOUS | Status: DC | PRN
  Administered 2022-04-14: 30 mg via INTRAVENOUS

## 2022-04-14 MED ORDER — HYDROCHLOROTHIAZIDE 25 MG PO TABS
25.0000 mg | ORAL_TABLET | Freq: Every day | ORAL | Status: DC
  Administered 2022-04-14 – 2022-04-17 (×4): 25 mg via ORAL
  Filled 2022-04-14 (×4): qty 1

## 2022-04-14 MED ORDER — ALLOPURINOL 300 MG PO TABS
300.0000 mg | ORAL_TABLET | Freq: Every day | ORAL | Status: DC
  Administered 2022-04-15 – 2022-04-17 (×3): 300 mg via ORAL
  Filled 2022-04-14 (×3): qty 1

## 2022-04-14 MED ORDER — LOSARTAN POTASSIUM 50 MG PO TABS
100.0000 mg | ORAL_TABLET | Freq: Every day | ORAL | Status: DC
  Administered 2022-04-14 – 2022-04-17 (×4): 100 mg via ORAL
  Filled 2022-04-14 (×4): qty 2

## 2022-04-14 MED ORDER — DEXAMETHASONE SODIUM PHOSPHATE 10 MG/ML IJ SOLN
INTRAMUSCULAR | Status: DC | PRN
  Administered 2022-04-14: 5 mg
  Administered 2022-04-14: 10 mg

## 2022-04-14 MED ORDER — CEFAZOLIN SODIUM-DEXTROSE 1-4 GM/50ML-% IV SOLN
1.0000 g | Freq: Four times a day (QID) | INTRAVENOUS | Status: AC
  Administered 2022-04-14 (×2): 1 g via INTRAVENOUS
  Filled 2022-04-14 (×2): qty 50

## 2022-04-14 MED ORDER — OXYCODONE HCL 5 MG PO TABS
5.0000 mg | ORAL_TABLET | ORAL | Status: DC | PRN
  Administered 2022-04-15 (×4): 10 mg via ORAL
  Administered 2022-04-17: 5 mg via ORAL
  Filled 2022-04-14: qty 1
  Filled 2022-04-14 (×3): qty 2
  Filled 2022-04-14: qty 1
  Filled 2022-04-14: qty 2

## 2022-04-14 MED ORDER — TRANEXAMIC ACID-NACL 1000-0.7 MG/100ML-% IV SOLN
1000.0000 mg | INTRAVENOUS | Status: AC
  Administered 2022-04-14: 1000 mg via INTRAVENOUS
  Filled 2022-04-14: qty 100

## 2022-04-14 MED ORDER — ONDANSETRON HCL 4 MG PO TABS: 4.0000 mg | ORAL_TABLET | Freq: Four times a day (QID) | ORAL | Status: DC | PRN

## 2022-04-14 MED ORDER — 0.9 % SODIUM CHLORIDE (POUR BTL) OPTIME
TOPICAL | Status: DC | PRN
  Administered 2022-04-14: 1000 mL

## 2022-04-14 MED ORDER — DIPHENHYDRAMINE HCL 12.5 MG/5ML PO ELIX
12.5000 mg | ORAL_SOLUTION | ORAL | Status: DC | PRN
  Administered 2022-04-14 – 2022-04-16 (×3): 25 mg via ORAL
  Filled 2022-04-14 (×3): qty 10

## 2022-04-14 MED ORDER — PHENOL 1.4 % MT LIQD: 1.0000 | OROMUCOSAL | Status: DC | PRN

## 2022-04-14 MED ORDER — FENTANYL CITRATE PF 50 MCG/ML IJ SOSY
25.0000 ug | PREFILLED_SYRINGE | INTRAMUSCULAR | Status: DC | PRN
  Administered 2022-04-14: 50 ug via INTRAVENOUS

## 2022-04-14 MED ORDER — ALUM & MAG HYDROXIDE-SIMETH 200-200-20 MG/5ML PO SUSP: 30.0000 mL | ORAL | Status: DC | PRN

## 2022-04-14 MED ORDER — FENTANYL CITRATE PF 50 MCG/ML IJ SOSY
PREFILLED_SYRINGE | INTRAMUSCULAR | Status: AC
  Filled 2022-04-14: qty 2

## 2022-04-14 SURGICAL SUPPLY — 65 items
APL SKNCLS STERI-STRIP NONHPOA (GAUZE/BANDAGES/DRESSINGS)
BAG COUNTER SPONGE SURGICOUNT (BAG) ×1 IMPLANT
BAG SPEC THK2 15X12 ZIP CLS (MISCELLANEOUS) ×1
BAG SPNG CNTER NS LX DISP (BAG) ×1
BAG ZIPLOCK 12X15 (MISCELLANEOUS) ×3 IMPLANT
BASEPLATE TIBIAL TRIATH (Orthopedic Implant) ×1 IMPLANT
BENZOIN TINCTURE PRP APPL 2/3 (GAUZE/BANDAGES/DRESSINGS) IMPLANT
BLADE SAG 18X100X1.27 (BLADE) ×2 IMPLANT
BLADE SURG SZ10 CARB STEEL (BLADE) ×4 IMPLANT
BNDG ELASTIC 6X5.8 VLCR STR LF (GAUZE/BANDAGES/DRESSINGS) ×3 IMPLANT
BOWL SMART MIX CTS (DISPOSABLE) ×1 IMPLANT
BSPLAT TIB 4 UNV CMNT TL STAB (Orthopedic Implant) ×1 IMPLANT
CEMENT BONE SIMPLEX SPEEDSET (Cement) ×2 IMPLANT
COOLER ICEMAN CLASSIC (MISCELLANEOUS) ×2 IMPLANT
COVER SURGICAL LIGHT HANDLE (MISCELLANEOUS) ×2 IMPLANT
CUFF TOURN SGL QUICK 34 (TOURNIQUET CUFF) ×2
CUFF TRNQT CYL 34X4.125X (TOURNIQUET CUFF) ×1 IMPLANT
DRAPE INCISE IOBAN 66X45 STRL (DRAPES) ×2 IMPLANT
DRAPE U-SHAPE 47X51 STRL (DRAPES) ×2 IMPLANT
DRSG PAD ABDOMINAL 8X10 ST (GAUZE/BANDAGES/DRESSINGS) ×6 IMPLANT
DURAPREP 26ML APPLICATOR (WOUND CARE) ×2 IMPLANT
ELECT BLADE TIP CTD 4 INCH (ELECTRODE) ×2 IMPLANT
ELECT REM PT RETURN 15FT ADLT (MISCELLANEOUS) ×2 IMPLANT
FEMORAL PEG DISTAL FIXATION (Orthopedic Implant) ×1 IMPLANT
FEMORAL POST STABILIZED NO 3 (Orthopedic Implant) ×1 IMPLANT
GAUZE PAD ABD 8X10 STRL (GAUZE/BANDAGES/DRESSINGS) ×1 IMPLANT
GAUZE SPONGE 4X4 12PLY STRL (GAUZE/BANDAGES/DRESSINGS) ×2 IMPLANT
GAUZE XEROFORM 1X8 LF (GAUZE/BANDAGES/DRESSINGS) ×1 IMPLANT
GLOVE BIO SURGEON STRL SZ7.5 (GLOVE) ×2 IMPLANT
GLOVE BIOGEL PI IND STRL 8 (GLOVE) ×2 IMPLANT
GLOVE BIOGEL PI INDICATOR 8 (GLOVE) ×2
GLOVE ECLIPSE 8.0 STRL XLNG CF (GLOVE) ×2 IMPLANT
GOWN STRL REUS W/ TWL XL LVL3 (GOWN DISPOSABLE) ×2 IMPLANT
GOWN STRL REUS W/TWL XL LVL3 (GOWN DISPOSABLE) ×4
HANDPIECE INTERPULSE COAX TIP (DISPOSABLE) ×2
HOLDER FOLEY CATH W/STRAP (MISCELLANEOUS) ×1 IMPLANT
IMMOBILIZER KNEE 20 (SOFTGOODS) ×2
IMMOBILIZER KNEE 20 THIGH 36 (SOFTGOODS) ×1 IMPLANT
INSERT TIB 4 13XPOST STAB BRNG (Insert) IMPLANT
INSERT TIB TRIATH 4X13 (Insert) ×2 IMPLANT
INSRT TIB 4 13XPOST STAB BRNG (Insert) ×1 IMPLANT
KIT TURNOVER KIT A (KITS) ×1 IMPLANT
NS IRRIG 1000ML POUR BTL (IV SOLUTION) ×2 IMPLANT
PACK TOTAL KNEE CUSTOM (KITS) ×3 IMPLANT
PAD COLD SHLDR WRAP-ON (PAD) ×2 IMPLANT
PADDING CAST COTTON 6X4 STRL (CAST SUPPLIES) ×3 IMPLANT
PATELLA 32MMX10MM (Knees) ×1 IMPLANT
PIN FLUTED HEDLESS FIX 3.5X1/8 (PIN) ×1 IMPLANT
PROTECTOR NERVE ULNAR (MISCELLANEOUS) ×2 IMPLANT
SET HNDPC FAN SPRY TIP SCT (DISPOSABLE) ×1 IMPLANT
SET PAD KNEE POSITIONER (MISCELLANEOUS) ×2 IMPLANT
SPIKE FLUID TRANSFER (MISCELLANEOUS) IMPLANT
STAPLER VISISTAT 35W (STAPLE) ×1 IMPLANT
STRIP CLOSURE SKIN 1/2X4 (GAUZE/BANDAGES/DRESSINGS) IMPLANT
SUT MNCRL AB 4-0 PS2 18 (SUTURE) IMPLANT
SUT VIC AB 0 CT1 27 (SUTURE) ×2
SUT VIC AB 0 CT1 27XBRD ANTBC (SUTURE) ×1 IMPLANT
SUT VIC AB 1 CT1 27 (SUTURE) ×2
SUT VIC AB 1 CT1 27XBRD ANTBC (SUTURE) IMPLANT
SUT VIC AB 1 CT1 36 (SUTURE) ×4 IMPLANT
SUT VIC AB 2-0 CT1 27 (SUTURE) ×4
SUT VIC AB 2-0 CT1 TAPERPNT 27 (SUTURE) ×2 IMPLANT
TRAY FOLEY MTR SLVR 16FR STAT (SET/KITS/TRAYS/PACK) ×1 IMPLANT
WATER STERILE IRR 1000ML POUR (IV SOLUTION) ×4 IMPLANT
WRAP KNEE MAXI GEL POST OP (GAUZE/BANDAGES/DRESSINGS) ×1 IMPLANT

## 2022-04-14 NOTE — Anesthesia Postprocedure Evaluation (Signed)
Anesthesia Post Note  Patient: RUBIE KAUFHOLD  Procedure(s) Performed: RIGHT TOTAL KNEE ARTHROPLASTY (Right: Knee)     Patient location during evaluation: PACU Anesthesia Type: MAC Level of consciousness: awake and alert Pain management: pain level controlled Vital Signs Assessment: post-procedure vital signs reviewed and stable Respiratory status: spontaneous breathing, nonlabored ventilation, respiratory function stable and patient connected to nasal cannula oxygen Cardiovascular status: stable and blood pressure returned to baseline Postop Assessment: no apparent nausea or vomiting Anesthetic complications: no   No notable events documented.  Last Vitals:  Vitals:   04/14/22 1225 04/14/22 1345  BP: 119/63 (!) 149/78  Pulse: 62 (!) 58  Resp:  16  Temp: 36.6 C 36.6 C  SpO2: 97% 99%    Last Pain:  Vitals:   04/14/22 1359  TempSrc:   PainSc: 2                  Leiana Rund

## 2022-04-15 DIAGNOSIS — M1711 Unilateral primary osteoarthritis, right knee: Secondary | ICD-10-CM | POA: Diagnosis not present

## 2022-04-15 LAB — BASIC METABOLIC PANEL
Anion gap: 9 (ref 5–15)
BUN: 27 mg/dL — ABNORMAL HIGH (ref 8–23)
CO2: 23 mmol/L (ref 22–32)
Calcium: 8.5 mg/dL — ABNORMAL LOW (ref 8.9–10.3)
Chloride: 104 mmol/L (ref 98–111)
Creatinine, Ser: 1.16 mg/dL — ABNORMAL HIGH (ref 0.44–1.00)
GFR, Estimated: 50 mL/min — ABNORMAL LOW (ref >60)
Glucose, Bld: 179 mg/dL — ABNORMAL HIGH (ref 70–99)
Potassium: 4.2 mmol/L (ref 3.5–5.1)
Sodium: 136 mmol/L (ref 135–145)

## 2022-04-15 LAB — CBC
HCT: 27.3 % — ABNORMAL LOW (ref 36.0–46.0)
Hemoglobin: 8.4 g/dL — ABNORMAL LOW (ref 12.0–15.0)
MCH: 28.2 pg (ref 26.0–34.0)
MCHC: 30.8 g/dL (ref 30.0–36.0)
MCV: 91.6 fL (ref 80.0–100.0)
Platelets: 283 10*3/uL (ref 150–400)
RBC: 2.98 MIL/uL — ABNORMAL LOW (ref 3.87–5.11)
RDW: 14 % (ref 11.5–15.5)
WBC: 13.4 10*3/uL — ABNORMAL HIGH (ref 4.0–10.5)
nRBC: 0 % (ref 0.0–0.2)

## 2022-04-15 LAB — GLUCOSE, CAPILLARY
Glucose-Capillary: 147 mg/dL — ABNORMAL HIGH (ref 70–99)
Glucose-Capillary: 96 mg/dL (ref 70–99)
Glucose-Capillary: 106 mg/dL — ABNORMAL HIGH (ref 70–99)
Glucose-Capillary: 135 mg/dL — ABNORMAL HIGH (ref 70–99)

## 2022-04-15 MED ORDER — METHOCARBAMOL 500 MG PO TABS: 500.0000 mg | ORAL_TABLET | Freq: Four times a day (QID) | ORAL | 1 refills | Status: AC | PRN

## 2022-04-15 MED ORDER — OXYCODONE HCL 5 MG PO TABS
5.0000 mg | ORAL_TABLET | ORAL | 0 refills | Status: DC | PRN
Start: 2022-04-15 — End: 2022-04-27

## 2022-04-15 MED ORDER — ASPIRIN 81 MG PO CHEW: 81.0000 mg | CHEWABLE_TABLET | Freq: Two times a day (BID) | ORAL | 0 refills | Status: AC

## 2022-04-15 NOTE — Progress Notes (Signed)
Physical Therapy Treatment Patient Details Name: Debbie Velasquez MRN: [id number] DOB: 1951-09-09 Today's Date: 04/15/2022   History of Present Illness Patient is a 71 y/o female admitted for R TKA.  PMH Positive for DM, ERD, HTN, L TKA, LBP and renal mass.    PT Comments    Pt progressing toward goals. Able to amb short distance into hallway, fatigued after however pain more controlled than yesterday per pt. Continue PT   Recommendations for follow up therapy are one component of a multi-disciplinary discharge planning process, led by the attending physician.  Recommendations may be updated based on patient status, additional functional criteria and insurance authorization.  Follow Up Recommendations  Follow physician's recommendations for discharge plan and follow up therapies     Assistance Recommended at Discharge Intermittent Supervision/Assistance  Patient can return home with the following A little help with walking and/or transfers;A little help with bathing/dressing/bathroom;Help with stairs or ramp for entrance;Assistance with cooking/housework;Assist for transportation   Equipment Recommendations  Rolling walker (2 wheels)    Recommendations for Other Services       Precautions / Restrictions Precautions Precautions: Fall;Knee Required Braces or Orthoses: Knee Immobilizer - Right Knee Immobilizer - Right: Discontinue once straight leg raise with < 10 degree lag Restrictions Weight Bearing Restrictions: No RLE Weight Bearing: Weight bearing as tolerated     Mobility  Bed Mobility Overal bed mobility: Needs Assistance Bed Mobility: Supine to Sit     Supine to sit: Min assist, HOB elevated     General bed mobility comments: increased time, assist for R LE    Transfers Overall transfer level: Needs assistance Equipment used: Rolling walker (2 wheels) Transfers: Sit to/from Stand Sit to Stand: Min assist           General transfer comment:  increased time and heavy UE support needed    Ambulation/Gait Ambulation/Gait assistance: Min guard Gait Distance (Feet): 25 Feet Assistive device: Rolling walker (2 wheels) Gait Pattern/deviations: Step-to pattern, Trunk flexed, Decreased stride length, Antalgic       General Gait Details: cues for posture, sequence, RW position   Stairs             Wheelchair Mobility    Modified Rankin (Stroke Patients Only)       Balance           Standing balance support: Bilateral upper extremity supported Standing balance-Leahy Scale: Poor Standing balance comment: UE support and flexed posture                            Cognition Arousal/Alertness: Awake/alert Behavior During Therapy: WFL for tasks assessed/performed Overall Cognitive Status: Within Functional Limits for tasks assessed                                          Exercises Total Joint Exercises Ankle Circles/Pumps: AROM, Both, 10 reps, Supine Quad Sets: AROM, Strengthening, 10 reps, Both    General Comments        Pertinent Vitals/Pain Pain Assessment Pain Assessment: 0-10 Pain Score: 7  Pain Location: R knee Pain Descriptors / Indicators: Discomfort, Grimacing, Guarding, Sore Pain Intervention(s): Limited activity within patient's tolerance, Monitored during session, Premedicated before session, Repositioned, Ice applied    Home Living  Prior Function            PT Goals (current goals can now be found in the care plan section) Acute Rehab PT Goals Patient Stated Goal: to return home (went to rehab last time) PT Goal Formulation: With patient/family Time For Goal Achievement: 04/21/22 Potential to Achieve Goals: Good Progress towards PT goals: Progressing toward goals    Frequency    7X/week      PT Plan Current plan remains appropriate    Co-evaluation              AM-PAC PT "6 Clicks" Mobility    Outcome Measure  Help needed turning from your back to your side while in a flat bed without using bedrails?: A Little Help needed moving from lying on your back to sitting on the side of a flat bed without using bedrails?: A Little Help needed moving to and from a bed to a chair (including a wheelchair)?: A Little Help needed standing up from a chair using your arms (e.g., wheelchair or bedside chair)?: A Little Help needed to walk in hospital room?: A Little Help needed climbing 3-5 steps with a railing? : A Lot 6 Click Score: 17    End of Session Equipment Utilized During Treatment: Gait belt Activity Tolerance: Patient limited by fatigue Patient left: in chair;with call bell/phone within reach;with chair alarm set Nurse Communication: Mobility status PT Visit Diagnosis: Other abnormalities of gait and mobility (R26.89);Difficulty in walking, not elsewhere classified (R26.2);Pain Pain - Right/Left: Right Pain - part of body: Knee     Time: 1200-1229 PT Time Calculation (min) (ACUTE ONLY): 29 min  Charges:  $Gait Training: 8-22 mins $Therapeutic Activity: 8-22 mins                     Delice Bison, PT  Acute Rehab Dept [facility]) [phone]  [facility] Weekend Pager [facility] only)  [phone]  04/15/2022    [facility] 04/15/2022, 4:23 PM

## 2022-04-15 NOTE — Progress Notes (Signed)
Subjective: 1 Day Post-Op Procedure(s) (LRB): RIGHT TOTAL KNEE ARTHROPLASTY (Right) Patient reports pain as moderate.  States that she had a rough evening.  Does have chronic anemia pre-op, so acute anemia not worrisome - vitals stable  Objective: Vital signs in last 24 hours: Temp:  [97.6 F (36.4 C)-98.5 F (36.9 C)] 98.1 F (36.7 C) (06/24 0458) Pulse Rate:  [57-76] 61 (06/24 0458) Resp:  [12-22] 20 (06/24 0458) BP: (111-158)/(56-78) 158/73 (06/24 0458) SpO2:  [97 %-100 %] 100 % (06/24 0458) Weight:  [116.8 kg] 116.8 kg (06/23 1225)  Intake/Output from previous day: 06/23 0701 - 06/24 0700 In: 3551.8 [P.O.:1210; I.V.:2041.8; IV Piggyback:300] Out: 2225 [Urine:2200; Blood:25] Intake/Output this shift: No intake/output data recorded.  Recent Labs    04/15/22 0346  HGB 8.4*   Recent Labs    04/15/22 0346  WBC 13.4*  RBC 2.98*  HCT 27.3*  PLT 283   Recent Labs    04/15/22 0346  NA 136  K 4.2  CL 104  CO2 23  BUN 27*  CREATININE 1.16*  GLUCOSE 179*  CALCIUM 8.5*   No results for input(s): "LABPT", "INR" in the last 72 hours.  Sensation intact distally Intact pulses distally Dorsiflexion/Plantar flexion intact Incision: dressing C/D/I Compartment soft   Assessment/Plan: 1 Day Post-Op Procedure(s) (LRB): RIGHT TOTAL KNEE ARTHROPLASTY (Right) Up with therapy Plan for discharge tomorrow Discharge home with home health      Kathryne Hitch 04/15/2022, 9:07 AM

## 2022-04-15 NOTE — Progress Notes (Signed)
04/15/22 1624  PT Visit Information  Last PT Received On 04/15/22  Assistance Needed +1  Pt continues to progress with gait distance/activity tolerance. Pt is hopeful to d/c home tomorrow   History of Present Illness Patient is a 71 y/o female admitted for R TKA.  PMH Positive for DM, ERD, HTN, L TKA, LBP and renal mass.  Subjective Data  Patient Stated Goal to return home (went to rehab last time)  Precautions  Precautions Fall;Knee  Required Braces or Orthoses Knee Immobilizer - Right  Knee Immobilizer - Right Discontinue once straight leg raise with < 10 degree lag  Restrictions  Weight Bearing Restrictions No  RLE Weight Bearing WBAT  Pain Assessment  Pain Assessment 0-10  Pain Score 6  Pain Location R knee  Pain Descriptors / Indicators Discomfort;Grimacing;Guarding;Sore  Pain Intervention(s) Limited activity within patient's tolerance;Monitored during session;Premedicated before session;Repositioned;Ice applied  Cognition  Arousal/Alertness Awake/alert  Behavior During Therapy WFL for tasks assessed/performed  Overall Cognitive Status Within Functional Limits for tasks assessed  Bed Mobility  Overal bed mobility Needs Assistance  Bed Mobility Sit to Supine  Sit to supine Min assist  General bed mobility comments increased time, assist for R LE  Transfers  Overall transfer level Needs assistance  Equipment used Rolling walker (2 wheels)  Transfers Sit to/from Stand  Sit to Stand Min assist;Min guard  General transfer comment increased time, cues for hand and RLE position  Ambulation/Gait  Ambulation/Gait assistance Min guard;Supervision  Gait Distance (Feet) 50 Feet/10' more   Assistive device Rolling walker (2 wheels)  Gait Pattern/deviations Step-to pattern;Trunk flexed;Decreased stride length  General Gait Details cues for posture, sequence, RW position. improved wt shift to RLE  Balance  Standing balance support Bilateral upper extremity supported  Standing  balance-Leahy Scale Poor  Standing balance comment UE support and flexed posture  Total Joint Exercises  Ankle Circles/Pumps AROM;Both;10 reps;Supine  Quad Sets AROM;Strengthening;10 reps;Both  Heel Slides AAROM;Right;10 reps  PT - End of Session  Equipment Utilized During Treatment Gait belt  Activity Tolerance Patient tolerated treatment well  Patient left with call bell/phone within reach;in bed;with bed alarm set  Nurse Communication Mobility status   PT - Assessment/Plan  PT Plan Current plan remains appropriate  PT Visit Diagnosis Other abnormalities of gait and mobility (R26.89);Difficulty in walking, not elsewhere classified (R26.2);Pain  Pain - Right/Left Right  Pain - part of body Knee  PT Frequency (ACUTE ONLY) 7X/week  Follow Up Recommendations Follow physician's recommendations for discharge plan and follow up therapies  Assistance recommended at discharge Intermittent Supervision/Assistance  Patient can return home with the following A little help with walking and/or transfers;A little help with bathing/dressing/bathroom;Help with stairs or ramp for entrance;Assistance with cooking/housework;Assist for transportation  PT equipment Rolling walker (2 wheels)  AM-PAC PT "6 Clicks" Mobility Outcome Measure (Version 2)  Help needed turning from your back to your side while in a flat bed without using bedrails? 3  Help needed moving from lying on your back to sitting on the side of a flat bed without using bedrails? 3  Help needed moving to and from a bed to a chair (including a wheelchair)? 3  Help needed standing up from a chair using your arms (e.g., wheelchair or bedside chair)? 3  Help needed to walk in hospital room? 3  Help needed climbing 3-5 steps with a railing?  2  6 Click Score 17  Consider Recommendation of Discharge To: Home with [facility]  Progressive Mobility  What  is the highest level of mobility based on the progressive mobility assessment? Level 5 (Walks with assist  in room/hall) - Balance while stepping forward/back and can walk in room with assist - Complete  Activity Ambulated with assistance in hallway  PT Goal Progression  Progress towards PT goals Progressing toward goals  Acute Rehab PT Goals  PT Goal Formulation With patient/family  Time For Goal Achievement 04/21/22  Potential to Achieve Goals Good  PT Time Calculation  PT Start Time (ACUTE ONLY) 1438  PT Stop Time (ACUTE ONLY) 1519  PT Time Calculation (min) (ACUTE ONLY) 41 min  PT General Charges  $$ ACUTE PT VISIT 1 Visit  PT Treatments  $Gait Training 23-37 mins  $Therapeutic Exercise 8-22 mins

## 2022-04-16 DIAGNOSIS — M1711 Unilateral primary osteoarthritis, right knee: Secondary | ICD-10-CM | POA: Diagnosis not present

## 2022-04-16 LAB — GLUCOSE, CAPILLARY
Glucose-Capillary: 106 mg/dL — ABNORMAL HIGH (ref 70–99)
Glucose-Capillary: 94 mg/dL (ref 70–99)
Glucose-Capillary: 118 mg/dL — ABNORMAL HIGH (ref 70–99)
Glucose-Capillary: 104 mg/dL — ABNORMAL HIGH (ref 70–99)

## 2022-04-16 NOTE — Progress Notes (Signed)
Physical Therapy Treatment Patient Details Name: Debbie Velasquez MRN: [id number] DOB: January 23, 1951 Today's Date: 04/16/2022   History of Present Illness Patient is a 71 y/o female admitted for R TKA.  PMH Positive for DM, ERD, HTN, L TKA, LBP and renal mass.    PT Comments    Pt continues to progress. She is hopeful to to d/c home today. TOC unable to get HHPT coverage for pt. Pt states her family can provide transport to [facility]. Will see again this pm.    Recommendations for follow up therapy are one component of a multi-disciplinary discharge planning process, led by the attending physician.  Recommendations may be updated based on patient status, additional functional criteria and insurance authorization.  Follow Up Recommendations  Follow physician's recommendations for discharge plan and follow up therapies     Assistance Recommended at Discharge Intermittent Supervision/Assistance  Patient can return home with the following A little help with walking and/or transfers;A little help with bathing/dressing/bathroom;Help with stairs or ramp for entrance;Assistance with cooking/housework;Assist for transportation   Equipment Recommendations  Rolling walker (2 wheels)    Recommendations for Other Services       Precautions / Restrictions Precautions Precautions: Fall;Knee Required Braces or Orthoses: Knee Immobilizer - Right Knee Immobilizer - Right: Discontinue once straight leg raise with < 10 degree lag Restrictions Weight Bearing Restrictions: No RLE Weight Bearing: Weight bearing as tolerated     Mobility  Bed Mobility               General bed mobility comments: in recliner    Transfers Overall transfer level: Needs assistance Equipment used: Rolling walker (2 wheels) Transfers: Sit to/from Stand Sit to Stand: Min guard, Supervision           General transfer comment: increased time, cues for hand and RLE position    Ambulation/Gait Ambulation/Gait  assistance: Supervision, Min guard Gait Distance (Feet): 70 Feet Assistive device: Rolling walker (2 wheels) Gait Pattern/deviations: Step-to pattern, Trunk flexed, Decreased stance time - right       General Gait Details: cues for posture, sequence, RW position. improved wt shift to RLE. min/guard to supervision for safety   Stairs             Wheelchair Mobility    Modified Rankin (Stroke Patients Only)       Balance           Standing balance support: Bilateral upper extremity supported Standing balance-Leahy Scale: Poor Standing balance comment: UE support and flexed posture                            Cognition Arousal/Alertness: Awake/alert Behavior During Therapy: WFL for tasks assessed/performed Overall Cognitive Status: Within Functional Limits for tasks assessed                                          Exercises Total Joint Exercises Ankle Circles/Pumps: AROM, Both, 10 reps, Supine Quad Sets: AROM, Strengthening, 10 reps, Both Heel Slides: AAROM, Right, 10 reps Straight Leg Raises: Left, 10 reps, AAROM    General Comments        Pertinent Vitals/Pain Pain Assessment Pain Assessment: 0-10 Pain Score: 5  Pain Location: R knee Pain Descriptors / Indicators: Discomfort, Grimacing, Guarding, Sore Pain Intervention(s): Limited activity within patient's tolerance, Monitored during session, Premedicated before session, Repositioned, Ice  applied    Home Living                          Prior Function            PT Goals (current goals can now be found in the care plan section) Acute Rehab PT Goals Patient Stated Goal: to return home (went to rehab last time) PT Goal Formulation: With patient/family Time For Goal Achievement: 04/21/22 Potential to Achieve Goals: Good Progress towards PT goals: Progressing toward goals    Frequency    7X/week      PT Plan Current plan remains appropriate     Co-evaluation              AM-PAC PT "6 Clicks" Mobility   Outcome Measure  Help needed turning from your back to your side while in a flat bed without using bedrails?: A Little Help needed moving from lying on your back to sitting on the side of a flat bed without using bedrails?: A Little Help needed moving to and from a bed to a chair (including a wheelchair)?: A Little Help needed standing up from a chair using your arms (e.g., wheelchair or bedside chair)?: A Little Help needed to walk in hospital room?: A Little Help needed climbing 3-5 steps with a railing? : A Lot 6 Click Score: 17    End of Session Equipment Utilized During Treatment: Gait belt Activity Tolerance: Patient tolerated treatment well Patient left: with call bell/phone within reach;in chair Nurse Communication: Mobility status PT Visit Diagnosis: Other abnormalities of gait and mobility (R26.89);Difficulty in walking, not elsewhere classified (R26.2);Pain Pain - Right/Left: Right Pain - part of body: Knee     Time: 0932-3557 PT Time Calculation (min) (ACUTE ONLY): 34 min  Charges:  $Gait Training: 8-22 mins $Therapeutic Exercise: 8-22 mins                     Delice Bison, PT  Acute Rehab Dept [facility]) [phone]  [facility] Weekend Pager [facility] only)  [phone]  04/16/2022    [facility] 04/16/2022, 12:27 PM

## 2022-04-16 NOTE — Progress Notes (Signed)
Patient ID: Debbie Velasquez, female   DOB: 1951/01/05, 71 y.o.   MRN: [id number]   Subjective: 2 Days Post-Op Procedure(s) (LRB): RIGHT TOTAL KNEE ARTHROPLASTY (Right) Patient reports pain as moderate.    Objective: Vital signs in last 24 hours: Temp:  [98.1 F (36.7 C)-98.6 F (37 C)] 98.6 F (37 C) (06/25 0529) Pulse Rate:  [64-72] 72 (06/25 0529) Resp:  [16-17] 17 (06/25 0529) BP: (135-174)/(47-67) 174/67 (06/25 0529) SpO2:  [97 %-100 %] 97 % (06/25 0529)  Intake/Output from previous day: 06/24 0701 - 06/25 0700 In: 1765.8 [P.O.:1560; I.V.:105.8] Out: 700 [Urine:700] Intake/Output this shift: No intake/output data recorded.  Recent Labs    04/15/22 0346  HGB 8.4*   Recent Labs    04/15/22 0346  WBC 13.4*  RBC 2.98*  HCT 27.3*  PLT 283   Recent Labs    04/15/22 0346  NA 136  K 4.2  CL 104  CO2 23  BUN 27*  CREATININE 1.16*  GLUCOSE 179*  CALCIUM 8.5*   No results for input(s): "LABPT", "INR" in the last 72 hours.  Neurologically intact No results found.  Assessment/Plan: 2 Days Post-Op Procedure(s) (LRB): RIGHT TOTAL KNEE ARTHROPLASTY (Right) Discharge home with home health  Eldred Manges 04/16/2022, 9:28 AM

## 2022-04-17 ENCOUNTER — Encounter (HOSPITAL_COMMUNITY): Payer: Self-pay | Admitting: Orthopaedic Surgery

## 2022-04-17 DIAGNOSIS — M1711 Unilateral primary osteoarthritis, right knee: Secondary | ICD-10-CM | POA: Diagnosis not present

## 2022-04-17 LAB — GLUCOSE, CAPILLARY: Glucose-Capillary: 116 mg/dL — ABNORMAL HIGH (ref 70–99)

## 2022-04-17 MED ORDER — BUPIVACAINE IN DEXTROSE 0.75-8.25 % IT SOLN
INTRATHECAL | Status: DC | PRN
  Administered 2022-04-14: 1.8 mL via INTRATHECAL

## 2022-04-17 NOTE — Progress Notes (Signed)
Patient ID: Debbie Velasquez, female   DOB: 05-06-1951, 71 y.o.   MRN: [id number] The patient is awake and alert this morning.  She feels better overall.  Her vital signs are stable and her right operative knee is stable.  There is concerned about insurance covering home health therapy for her.  If no home health therapy is established, we will need to get her set up for outpatient physical therapy.  She can be discharged home today though.

## 2022-04-17 NOTE — Discharge Summary (Signed)
Patient ID: Debbie Velasquez MRN: [id number] DOB/AGE: 12-02-1950 71 y.o.  Admit date: 04/14/2022 Discharge date: 04/17/2022  Admission Diagnoses:  Principal Problem:   Primary osteoarthritis of right knee Active Problems:   Status post total right knee replacement   Discharge Diagnoses:  Same  Past Medical History:  Diagnosis Date   Anemia    Arthritis    Cancer of left kidney (HCC)    Left renal mass   GERD (gastroesophageal reflux disease)    History of kidney stones    x3 -remains with one on right.    Hx of seasonal allergies    rare flare ups   Hypertension    Left renal mass    Obesity    Pneumonia    hx of 20 years ago   Right ureteral stone    Type 2 diabetes mellitus (HCC)    Type 2   Wears glasses    Wears partial dentures    upper    Surgeries: Procedure(s): RIGHT TOTAL KNEE ARTHROPLASTY on 04/14/2022   Consultants:   Discharged Condition: Improved  Hospital Course: Debbie Velasquez is an 71 y.o. female who was admitted 04/14/2022 for operative treatment ofPrimary osteoarthritis of right knee. Patient has severe unremitting pain that affects sleep, daily activities, and work/hobbies. After pre-op clearance the patient was taken to the operating room on 04/14/2022 and underwent  Procedure(s): RIGHT TOTAL KNEE ARTHROPLASTY.    Patient was given perioperative antibiotics:  Anti-infectives (From admission, onward)    Start     Dose/Rate Route Frequency Ordered Stop   04/14/22 1500  ceFAZolin (ANCEF) IVPB 1 g/50 mL premix        1 g 100 mL/hr over 30 Minutes Intravenous Every 6 hours 04/14/22 1113 04/14/22 2124   04/14/22 0600  ceFAZolin (ANCEF) IVPB 2g/100 mL premix        2 g 200 mL/hr over 30 Minutes Intravenous On call to O.R. 04/14/22 0535 04/14/22 6606        Patient was given sequential compression devices, early ambulation, and chemoprophylaxis to prevent DVT.  Patient benefited maximally from hospital stay and there were no  complications.    Recent vital signs: Patient Vitals for the past 24 hrs:  BP Temp Temp src Pulse Resp SpO2  04/17/22 0552 (!) 170/65 99.1 F (37.3 C) Oral 73 17 99 %  04/16/22 2103 (!) 176/54 99 F (37.2 C) Oral 81 17 99 %  04/16/22 1348 (!) 144/46 99.1 F (37.3 C) Oral 82 18 99 %     Recent laboratory studies:  Recent Labs    04/15/22 0346  WBC 13.4*  HGB 8.4*  HCT 27.3*  PLT 283  NA 136  K 4.2  CL 104  CO2 23  BUN 27*  CREATININE 1.16*  GLUCOSE 179*  CALCIUM 8.5*     Discharge Medications:   Allergies as of 04/17/2022   No Known Allergies      Medication List     STOP taking these medications    aspirin EC 81 MG tablet Replaced by: aspirin 81 MG chewable tablet       TAKE these medications    allopurinol 300 MG tablet Commonly known as: ZYLOPRIM Take 300 mg by mouth daily.   aspirin 81 MG chewable tablet Chew 1 tablet (81 mg total) by mouth 2 (two) times daily. Replaces: aspirin EC 81 MG tablet   losartan-hydrochlorothiazide 100-25 MG tablet Commonly known as: HYZAAR Take 1 tablet by mouth daily.   metFORMIN  500 MG tablet Commonly known as: GLUCOPHAGE Take 500 mg by mouth daily with breakfast.   methocarbamol 500 MG tablet Commonly known as: ROBAXIN Take 1 tablet (500 mg total) by mouth every 6 (six) hours as needed for muscle spasms.   oxyCODONE 5 MG immediate release tablet Commonly known as: Oxy IR/ROXICODONE Take 1-2 tablets (5-10 mg total) by mouth every 4 (four) hours as needed for moderate pain (pain score 4-6).   Turmeric 400 MG Caps Take 400 mg by mouth daily.   Vitamin D (Ergocalciferol) 1.25 MG (50000 UNIT) Caps capsule Commonly known as: DRISDOL Take 50,000 Units by mouth every Thursday.               Durable Medical Equipment  (From admission, onward)           Start     Ordered   04/14/22 1233  DME 3 n 1  Once        04/14/22 1232   04/14/22 1233  DME Walker rolling  Once       Question Answer Comment   Walker: With 5 Inch Wheels   Patient needs a walker to treat with the following condition Status post right knee replacement      04/14/22 1232            Diagnostic Studies: DG Knee Right Port  Result Date: 04/14/2022 CLINICAL DATA:  Status post right knee arthroplasty. EXAM: PORTABLE RIGHT KNEE - 1-2 VIEW COMPARISON:  None Available. FINDINGS: Status post right knee arthroplasty with surgical sutures about the anterior aspect of the knee. Subcutaneous emphysema as expected. No perihardware fracture. IMPRESSION: Status post right knee arthroplasty with postsurgical changes as expected. Electronically Signed   By: Debbie Velasquez D.O.   On: 04/14/2022 12:00    Disposition: Discharge disposition: 01-Home or Self Care          Follow-up Information     Kathryne Hitch, MD Follow up in 2 week(s).   Specialty: Orthopedic Surgery Contact information: [address] [place] [address] [phone]                  Signed: Kathryne Hitch 04/17/2022, 7:37 AM

## 2022-04-17 NOTE — Plan of Care (Signed)
Patient for discharge home with husband. Discharge instructions given.

## 2022-04-19 ENCOUNTER — Other Ambulatory Visit: Payer: Self-pay

## 2022-04-19 ENCOUNTER — Encounter: Payer: Self-pay | Admitting: Physical Therapy

## 2022-04-19 ENCOUNTER — Telehealth: Payer: Self-pay | Admitting: Orthopaedic Surgery

## 2022-04-19 ENCOUNTER — Ambulatory Visit (INDEPENDENT_AMBULATORY_CARE_PROVIDER_SITE_OTHER): Payer: Medicare (Managed Care) | Admitting: Physical Therapy

## 2022-04-19 DIAGNOSIS — R2681 Unsteadiness on feet: Secondary | ICD-10-CM

## 2022-04-19 DIAGNOSIS — M25561 Pain in right knee: Secondary | ICD-10-CM | POA: Diagnosis not present

## 2022-04-19 DIAGNOSIS — R600 Localized edema: Secondary | ICD-10-CM

## 2022-04-19 DIAGNOSIS — Z96651 Presence of right artificial knee joint: Secondary | ICD-10-CM

## 2022-04-19 DIAGNOSIS — M25661 Stiffness of right knee, not elsewhere classified: Secondary | ICD-10-CM | POA: Diagnosis not present

## 2022-04-19 DIAGNOSIS — M6281 Muscle weakness (generalized): Secondary | ICD-10-CM

## 2022-04-19 DIAGNOSIS — R2689 Other abnormalities of gait and mobility: Secondary | ICD-10-CM

## 2022-04-19 NOTE — Telephone Encounter (Signed)
Patient called. She would like a referral for physical therapy. Her call back number is [phone]

## 2022-04-19 NOTE — Therapy (Signed)
OUTPATIENT PHYSICAL THERAPY LOWER EXTREMITY EVALUATION   Patient Name: Debbie Velasquez MRN: [id number] DOB:02/27/1951, 71 y.o., female Today's Date: 04/19/2022   PT End of Session - 04/19/22 1548     Visit Number 1    Number of Visits 28    Date for PT Re-Evaluation 07/07/22    Authorization Type [employer]    PT Start Time 1502    PT Stop Time 1603    PT Time Calculation (min) 61 min    Activity Tolerance Patient tolerated treatment well;Patient limited by fatigue;Patient limited by pain    Behavior During Therapy WFL for tasks assessed/performed             Past Medical History:  Diagnosis Date   Anemia    Arthritis    Cancer of left kidney ([place])    Left renal mass   GERD (gastroesophageal reflux disease)    History of kidney stones    x3 -remains with one on right.    Hx of seasonal allergies    rare flare ups   Hypertension    Left renal mass    Obesity    Pneumonia    hx of 20 years ago   Right ureteral stone    Type 2 diabetes mellitus ([place])    Type 2   Wears glasses    Wears partial dentures    upper   Past Surgical History:  Procedure Laterality Date   CESAREAN SECTION  yrs ago   CYSTOSCOPY WITH RETROGRADE PYELOGRAM, URETEROSCOPY AND STENT PLACEMENT Left 05/24/2018   Procedure: CYSTOSCOPY WITH RETROGRADE PYELOGRAM, URETEROSCOPY AND STENT PLACEMENT;  Surgeon: Alexis Frock, MD;  Location: [facility];  Service: Urology;  Laterality: Left;   CYSTOSCOPY WITH RETROGRADE PYELOGRAM, URETEROSCOPY AND STENT PLACEMENT Left 02/27/2020   Procedure: CYSTOSCOPY WITH RETROGRADE PYELOGRAM, URETEROSCOPY AND STENT PLACEMENT;  Surgeon: Alexis Frock, MD;  Location: [facility];  Service: Urology;  Laterality: Left;  1 HR   CYSTOSCOPY WITH RETROGRADE PYELOGRAM, URETEROSCOPY AND STENT PLACEMENT Bilateral 10/20/2020   Procedure: CYSTOSCOPY WITH RETROGRADE PYELOGRAM, URETEROSCOPY AND STENT PLACEMENT;  Surgeon: Alexis Frock, MD;  Location: [facility];  Service: Urology;   Laterality: Bilateral;  75 MINS   HOLMIUM LASER APPLICATION Left 10/26/9700   Procedure: HOLMIUM LASER APPLICATION;  Surgeon: Alexis Frock, MD;  [facility]: [facility];  Service: Urology;  Laterality: Left;   HOLMIUM LASER APPLICATION Left 03/25/7857   Procedure: HOLMIUM LASER APPLICATION;  Surgeon: Alexis Frock, MD;  [facility]: [facility];  Service: Urology;  Laterality: Left;   HOLMIUM LASER APPLICATION Bilateral 85/11/7739   Procedure: HOLMIUM LASER APPLICATION;  Surgeon: Alexis Frock, MD;  [facility]: [facility];  Service: Urology;  Laterality: Bilateral;   ROBOTIC ASSITED PARTIAL NEPHRECTOMY Left 12/22/2015   Procedure: XI ROBOTIC ASSITED PARTIAL NEPHRECTOMY;  Surgeon: Alexis Frock, MD;  [facility];  Service: Urology;  Laterality: Left;   TOTAL ABDOMINAL HYSTERECTOMY W/ BILATERAL SALPINGOOPHORECTOMY  1980's   TOTAL KNEE ARTHROPLASTY Left 01/12/2017   Procedure: LEFT TOTAL KNEE ARTHROPLASTY and Right knee steroid injection;  Surgeon: Mcarthur Rossetti, MD;  [facility];  Service: Orthopedics;  Laterality: Left;   TOTAL KNEE ARTHROPLASTY Right 04/14/2022   Procedure: RIGHT TOTAL KNEE ARTHROPLASTY;  Surgeon: Mcarthur Rossetti, MD;  [facility];  Service: Orthopedics;  Laterality: Right;   Patient Active Problem List   Diagnosis Date Noted   Status post total right knee replacement 04/14/2022   Chest pain 10/09/2020   Nausea 10/09/2020   Hypertension  Type 2 diabetes mellitus ([facility])    GERD (gastroesophageal reflux disease)    Facet degeneration of lumbar region 12/18/2018   Chronic low back pain without sciatica 06/18/2017   Primary osteoarthritis of right knee 03/26/2017   Presence of left artificial knee joint 02/26/2017   Unilateral primary osteoarthritis, left knee 01/12/2017   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/12/2017   Renal mass 12/22/2015    PCP: Nolene Ebbs, MD  REFERRING PROVIDER: Mcarthur Rossetti, MD  REFERRING DIAG: [phone] (ICD-10-CM)  - Status post right knee replacement  THERAPY DIAG:  Stiffness of right knee, not elsewhere classified  Muscle weakness (generalized)  Localized edema  Acute pain of right knee  Other abnormalities of gait and mobility  Unsteadiness on feet  Rationale for Evaluation and Treatment Rehabilitation  ONSET DATE: 04/14/2022 right TKR  SUBJECTIVE:   SUBJECTIVE STATEMENT: She underwent right TKR on 04/14/2022.  Her insurance would not approve HHPT.   PERTINENT HISTORY: OA, left TKR 2018, left kidney CA with partial nephrectomy 2017, HTN, obesity, DM2, LBP with sciatica RLE  PAIN:  Are you having pain? Yes: NPRS scale: today 2/10 in last week lowest 2/10 & highest 10/10 Pain location: right knee more in back of knee & anterior shin Pain description: throbbing, stinging, burning Aggravating factors: walking a lot, initially bending after it gets stiff Relieving factors: meds, ice, elevation  PRECAUTIONS: None  WEIGHT BEARING RESTRICTIONS No  FALLS:  Has patient fallen in last 6 months? No  LIVING ENVIRONMENT: Lives with: lives with their spouse and lives with their daughter Lives in: House/apartment Stairs: Yes: External: 2 steps; on left going up Has following equipment at home: Single point cane, Walker - 2 wheeled, and built in Civil engineer, contracting  OCCUPATION: retired  PLOF: Independent, Independent with household mobility without device, and Independent with community mobility without device  PATIENT GOALS  walk without device, water aerobics,    OBJECTIVE:   DIAGNOSTIC FINDINGS: 04/14/22 Status post right knee arthroplasty with surgical sutures about the anterior aspect of the knee. Subcutaneous emphysema as expected. No perihardware fracture.  PATIENT SURVEYS:  FOTO 04/19/22  43% with target 63%  COGNITION: Overall cognitive status: Within functional limits for tasks assessed     SENSATION: Light touch: WFL  EDEMA:  RLE:  above knee 61.3cm around knee  55cm  below  knee 43.4cm LLE:  above knee 56.0 cm  around knee 45cm below knee  41cm  POSTURE: 6/28/203:  rounded shoulders, forward head, flexed trunk , and weight shift left  PALPATION: 6/28/203:  tenderness all areas of right knee, redness & warmth laterally and medially.   LOWER EXTREMITY ROM:  ROM P:passive  A:active Right eval Left eval  Hip flexion    Hip extension    Hip abduction    Hip adduction    Hip internal rotation    Hip external rotation    Knee flexion Supine head propped up P: 47* Seated A: 42*   Knee extension Supine P: -8* A: -30* SAQ   Ankle dorsiflexion    Ankle plantarflexion    Ankle inversion    Ankle eversion     (Blank rows = not tested)  LOWER EXTREMITY MMT:  MMT Right eval Left eval  Hip flexion    Hip extension    Hip abduction    Hip adduction    Hip internal rotation    Hip external rotation    Knee flexion 2-/5   Knee extension 2-/5   Ankle  dorsiflexion    Ankle plantarflexion    Ankle inversion    Ankle eversion     (Blank rows = not tested)  BED MOBILITY:  04/19/2022  Patient requires total manual assist for RLE for sit to supine & supine to sit.    GAIT: 6/28/203:  Distance walked: 50' Assistive device utilized: Environmental consultant - 2 wheeled Level of assistance: SBA Comments: antalgic gait with decreased stance RLE, right knee flexed in stance with minimal to no increase flexion for swing.     TODAY'S TREATMENT: Therex:         HEP instruction/performance c cues for techniques, handout provided.  Trial set performed of each for comprehension and symptom assessment.  See below for exercise list. Noted decrease in range for exercise based off symptoms.  Nustep seat 10 level 1 with BLEs & BUEs 5 sec hold for flexion 5 min.   PATIENT EDUCATION:  Education details: Access Code: [email or web address] Person educated: Patient Education method: Explanation, Demonstration, Tactile cues, Verbal cues, and Handouts Education comprehension: verbalized  understanding, returned demonstration, verbal cues required, tactile cues required, and needs further education   HOME EXERCISE PROGRAM: Access Code: [place] URL: [email or web address] Date: 04/19/2022 Prepared by: Jamey Reas  Exercises - Seated Knee Flexion Extension AROM   - 2-4 x daily - 7 x weekly - 2 sets - 10 reps - 5 seconds hold - Seated Hamstring Stretch with Strap  - 2-4 x daily - 7 x weekly - 1 sets - 2-3 reps - 30 seconds hold - Seated Quad Set  - 2-4 x daily - 7 x weekly - 2 sets - 10 reps - 5 seconds hold - Supine Short Arc Quad  - 2-4 x daily - 7 x weekly - 2 sets - 10 reps - 5 seconds hold - Supine Heel Slide with Strap  - 2-4 x daily - 7 x weekly - 2 sets - 10 reps - 5 seconds hold - Quad Setting and Stretching  - 2-4 x daily - 7 x weekly - 5-10 sets - 10 reps - prop 5-10 minutes & quad set5 seconds hold  ASSESSMENT:  CLINICAL IMPRESSION: Patient is a 71 y.o. female who was seen today for physical therapy evaluation and treatment for right TKR. Patient demonstrates decreased strength, ROM, increased edema and pain with gait abnormalities affecting functional mobility.  OBJECTIVE IMPAIRMENTS Abnormal gait, decreased activity tolerance, decreased balance, decreased endurance, decreased knowledge of use of DME, decreased mobility, difficulty walking, decreased ROM, decreased strength, increased edema, impaired flexibility, postural dysfunction, obesity, and pain.   ACTIVITY LIMITATIONS carrying, lifting, bending, sitting, standing, squatting, sleeping, stairs, transfers, bed mobility, and locomotion level  PARTICIPATION LIMITATIONS: meal prep, cleaning, driving, and community activity  PERSONAL FACTORS Age, Fitness, Past/current experiences, and 3+ comorbidities: see PMH  are also affecting patient's functional outcome.   REHAB POTENTIAL: Good  CLINICAL DECISION MAKING: Stable/uncomplicated  EVALUATION COMPLEXITY: Low   GOALS: Goals reviewed with  patient? Yes  SHORT TERM GOALS: Target date: 05/19/2022  Patient independent and verbalizes compliance with initial HEP Baseline: SEE OBJECTIVE DATA Goal status: INITIAL  2.  Patient reports 50% improvement in right knee pain. Baseline: SEE OBJECTIVE DATA Goal status: INITIAL  3.  PROM right knee extension -3* to flexion 90* Baseline: SEE OBJECTIVE DATA Goal status: INITIAL  LONG TERM GOALS: Target date: 07/07/2022  Patient will improve FOTO score to 63% Baseline: SEE OBJECTIVE DATA Goal status: INITIAL  2.  Patient reports right knee pain </= 2/10 with standing &  gait activities.  Baseline: SEE OBJECTIVE DATA Goal status: INITIAL  3.  Right Knee PROM 0* extension to 100* flexion Baseline: SEE OBJECTIVE DATA Goal status: INITIAL  4.  Right Knee AROM seated -3* extension to 90* flexion Baseline: SEE OBJECTIVE DATA Goal status: INITIAL  5.  Patient ambulates >300' community distances including negotiating ramps, curbs & stairs with LRAD independently. Baseline: SEE OBJECTIVE DATA Goal status: INITIAL    PLAN: PT FREQUENCY: 2-3 x/wk  PT DURATION: 12 weeks  PLANNED INTERVENTIONS: Therapeutic exercises, Therapeutic activity, Neuromuscular re-education, Balance training, Gait training, Patient/Family education, Joint mobilization, Stair training, DME instructions, Aquatic Therapy, Electrical stimulation, Cryotherapy, Moist heat, scar mobilization, Taping, Vasopneumatic device, Manual therapy, and physical performance testing  PLAN FOR NEXT SESSION: Manual therapy & exercise emphasizing range, review & update HEP. End with vaso.   Jamey Reas, PT, DPT 04/19/2022, 4:26 PM

## 2022-04-19 NOTE — Telephone Encounter (Signed)
Referral for PT sent. 

## 2022-04-20 ENCOUNTER — Encounter: Payer: Self-pay | Admitting: Physical Therapy

## 2022-04-20 ENCOUNTER — Ambulatory Visit (INDEPENDENT_AMBULATORY_CARE_PROVIDER_SITE_OTHER): Payer: Medicare (Managed Care) | Admitting: Physical Therapy

## 2022-04-20 DIAGNOSIS — M25561 Pain in right knee: Secondary | ICD-10-CM | POA: Diagnosis not present

## 2022-04-20 DIAGNOSIS — M6281 Muscle weakness (generalized): Secondary | ICD-10-CM

## 2022-04-20 DIAGNOSIS — R2689 Other abnormalities of gait and mobility: Secondary | ICD-10-CM

## 2022-04-20 DIAGNOSIS — M25661 Stiffness of right knee, not elsewhere classified: Secondary | ICD-10-CM

## 2022-04-20 DIAGNOSIS — R600 Localized edema: Secondary | ICD-10-CM | POA: Diagnosis not present

## 2022-04-20 NOTE — Therapy (Signed)
OUTPATIENT PHYSICAL THERAPY TREATMENT NOTE   Patient Name: Debbie Velasquez MRN: [id number] DOB:09-05-1951, 71 y.o., female Today's Date: 04/20/2022  END OF SESSION:   PT End of Session - 04/20/22 1528     Visit Number 2    Number of Visits 28    Date for PT Re-Evaluation 07/07/22    Authorization Type [employer]    PT Start Time 2426    PT Stop Time 1605    PT Time Calculation (min) 41 min    Activity Tolerance Patient tolerated treatment well;Patient limited by fatigue;Patient limited by pain    Behavior During Therapy WFL for tasks assessed/performed             Past Medical History:  Diagnosis Date   Anemia    Arthritis    Cancer of left kidney ([place])    Left renal mass   GERD (gastroesophageal reflux disease)    History of kidney stones    x3 -remains with one on right.    Hx of seasonal allergies    rare flare ups   Hypertension    Left renal mass    Obesity    Pneumonia    hx of 20 years ago   Right ureteral stone    Type 2 diabetes mellitus ([place])    Type 2   Wears glasses    Wears partial dentures    upper   Past Surgical History:  Procedure Laterality Date   CESAREAN SECTION  yrs ago   CYSTOSCOPY WITH RETROGRADE PYELOGRAM, URETEROSCOPY AND STENT PLACEMENT Left 05/24/2018   Procedure: CYSTOSCOPY WITH RETROGRADE PYELOGRAM, URETEROSCOPY AND STENT PLACEMENT;  Surgeon: Alexis Frock, MD;  [facility];  Service: Urology;  Laterality: Left;   CYSTOSCOPY WITH RETROGRADE PYELOGRAM, URETEROSCOPY AND STENT PLACEMENT Left 02/27/2020   Procedure: CYSTOSCOPY WITH RETROGRADE PYELOGRAM, URETEROSCOPY AND STENT PLACEMENT;  Surgeon: Alexis Frock, MD;  [facility];  Service: Urology;  Laterality: Left;  1 HR   CYSTOSCOPY WITH RETROGRADE PYELOGRAM, URETEROSCOPY AND STENT PLACEMENT Bilateral 10/20/2020   Procedure: CYSTOSCOPY WITH RETROGRADE PYELOGRAM, URETEROSCOPY AND STENT PLACEMENT;  Surgeon: Alexis Frock, MD;  [facility];  Service: Urology;   Laterality: Bilateral;  75 MINS   HOLMIUM LASER APPLICATION Left 05/26/4195   Procedure: HOLMIUM LASER APPLICATION;  Surgeon: Alexis Frock, MD;  [facility];  Service: Urology;  Laterality: Left;   HOLMIUM LASER APPLICATION Left 11/24/2977   Procedure: HOLMIUM LASER APPLICATION;  Surgeon: Alexis Frock, MD;  [facility];  Service: Urology;  Laterality: Left;   HOLMIUM LASER APPLICATION Bilateral 89/21/1941   Procedure: HOLMIUM LASER APPLICATION;  Surgeon: Alexis Frock, MD;  [facility];  Service: Urology;  Laterality: Bilateral;   ROBOTIC ASSITED PARTIAL NEPHRECTOMY Left 12/22/2015   Procedure: XI ROBOTIC ASSITED PARTIAL NEPHRECTOMY;  Surgeon: Alexis Frock, MD;  [facility];  Service: Urology;  Laterality: Left;   TOTAL ABDOMINAL HYSTERECTOMY W/ BILATERAL SALPINGOOPHORECTOMY  1980's   TOTAL KNEE ARTHROPLASTY Left 01/12/2017   Procedure: LEFT TOTAL KNEE ARTHROPLASTY and Right knee steroid injection;  Surgeon: Mcarthur Rossetti, MD;  [facility];  Service: Orthopedics;  Laterality: Left;   TOTAL KNEE ARTHROPLASTY Right 04/14/2022   Procedure: RIGHT TOTAL KNEE ARTHROPLASTY;  Surgeon: Mcarthur Rossetti, MD;  [facility];  Service: Orthopedics;  Laterality: Right;   Patient Active Problem List   Diagnosis Date Noted   Status post total right knee replacement 04/14/2022   Chest pain 10/09/2020   Nausea 10/09/2020  Hypertension    Type 2 diabetes mellitus ([facility])    GERD (gastroesophageal reflux disease)    Facet degeneration of lumbar region 12/18/2018   Chronic low back pain without sciatica 06/18/2017   Primary osteoarthritis of right knee 03/26/2017   Presence of left artificial knee joint 02/26/2017   Unilateral primary osteoarthritis, left knee 01/12/2017   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/12/2017   Renal mass 12/22/2015    THERAPY DIAG:  Stiffness of right knee, not elsewhere classified  Muscle weakness (generalized)  Localized  edema  Acute pain of right knee  Other abnormalities of gait and mobility  PCP: Nolene Ebbs, MD   REFERRING PROVIDER: Mcarthur Rossetti, MD   REFERRING DIAG: [phone] (ICD-10-CM) - Status post right knee replacement  Rationale for Evaluation and Treatment Rehabilitation   ONSET DATE: 04/14/2022 right TKR   SUBJECTIVE:    SUBJECTIVE STATEMENT: She says her pain is not good today, her knee is irritated and swollen.   PERTINENT HISTORY: OA, left TKR 2018, left kidney CA with partial nephrectomy 2017, HTN, obesity, DM2, LBP with sciatica RLE   PAIN:  Are you having pain? Yes: NPRS scale: today 6/10 currently Pain location: right knee more in back of knee & anterior shin Pain description: throbbing, stinging, burning Aggravating factors: walking a lot, initially bending after it gets stiff Relieving factors: meds, ice, elevation   PRECAUTIONS: None   WEIGHT BEARING RESTRICTIONS No   FALLS:  Has patient fallen in last 6 months? No   LIVING ENVIRONMENT: Lives with: lives with their spouse and lives with their daughter Lives in: House/apartment Stairs: Yes: External: 2 steps; on left going up Has following equipment at home: Single point cane, Walker - 2 wheeled, and built in Civil engineer, contracting   OCCUPATION: retired   PLOF: Independent, Independent with household mobility without device, and Independent with community mobility without device   PATIENT GOALS  walk without device, water aerobics,      OBJECTIVE:    DIAGNOSTIC FINDINGS: 04/14/22 Status post right knee arthroplasty with surgical sutures about the anterior aspect of the knee. Subcutaneous emphysema as expected. No perihardware fracture.   PATIENT SURVEYS:  FOTO 04/19/22  43% with target 63%   COGNITION: Overall cognitive status: Within functional limits for tasks assessed                          SENSATION: Light touch: WFL   EDEMA:  RLE:  above knee 61.3cm around knee  55cm  below knee 43.4cm LLE:   above knee 56.0 cm  around knee 45cm below knee  41cm   POSTURE: 6/28/203:  rounded shoulders, forward head, flexed trunk , and weight shift left   PALPATION: 6/28/203:  tenderness all areas of right knee, redness & warmth laterally and medially.    LOWER EXTREMITY ROM:   ROM P:passive  A:active Right eval Left eval  Hip flexion      Hip extension      Hip abduction      Hip adduction      Hip internal rotation      Hip external rotation      Knee flexion Supine head propped up P: 47* Seated A: 42*    Knee extension Supine P: -8* A: -30* SAQ    Ankle dorsiflexion      Ankle plantarflexion      Ankle inversion      Ankle eversion       (  Blank rows = not tested)   LOWER EXTREMITY MMT:   MMT Right eval Left eval  Hip flexion      Hip extension      Hip abduction      Hip adduction      Hip internal rotation      Hip external rotation      Knee flexion 2-/5    Knee extension 2-/5    Ankle dorsiflexion      Ankle plantarflexion      Ankle inversion      Ankle eversion       (Blank rows = not tested)   BED MOBILITY:  04/19/2022  Patient requires total manual assist for RLE for sit to supine & supine to sit.     GAIT: 6/28/203:  Distance walked: 50' Assistive device utilized: Environmental consultant - 2 wheeled Level of assistance: SBA Comments: antalgic gait with decreased stance RLE, right knee flexed in stance with minimal to no increase flexion for swing.        TODAY'S TREATMENT: 04/20/22 -Seated LAQ X10 -Seated knee flexion AAROM heel slide using strap 5 sec X10 -seated hamstring with calf stretch with strap 10 sec X10 -Seated marches on Rt X10 -Supine knee flexion stretch on table low load long duration stretch knee popped over bolster with towel rolled up X 5 min until heel finally touches bed -Manual therapy for Rt knee PROM into flexion and extension with overpressure -Vasopnuematic device to Rt kneeX 10 min medium compression, lowest temp.   PATIENT EDUCATION:   Education details: Access Code: [email or web address] Person educated: Patient Education method: Explanation, Demonstration, Tactile cues, Verbal cues, and Handouts Education comprehension: verbalized understanding, returned demonstration, verbal cues required, tactile cues required, and needs further education     HOME EXERCISE PROGRAM: Access Code: [place] URL: [email or web address] Date: 04/19/2022 Prepared by: Jamey Reas   Exercises - Seated Knee Flexion Extension AROM   - 2-4 x daily - 7 x weekly - 2 sets - 10 reps - 5 seconds hold - Seated Hamstring Stretch with Strap  - 2-4 x daily - 7 x weekly - 1 sets - 2-3 reps - 30 seconds hold - Seated Quad Set  - 2-4 x daily - 7 x weekly - 2 sets - 10 reps - 5 seconds hold - Supine Short Arc Quad  - 2-4 x daily - 7 x weekly - 2 sets - 10 reps - 5 seconds hold - Supine Heel Slide with Strap  - 2-4 x daily - 7 x weekly - 2 sets - 10 reps - 5 seconds hold - Quad Setting and Stretching  - 2-4 x daily - 7 x weekly - 5-10 sets - 10 reps - prop 5-10 minutes & quad set5 seconds hold   ASSESSMENT:   CLINICAL IMPRESSION: Session limited by pain. We worked to improve ROM as tolerated. Her knee extension ROM is actually doing well but flexion ROM very limited so I used low load long duration stretch to help with this. I used vaso post tx to reduce pain, inflammation and edema in her Rt knee.  OBJECTIVE IMPAIRMENTS Abnormal gait, decreased activity tolerance, decreased balance, decreased endurance, decreased knowledge of use of DME, decreased mobility, difficulty walking, decreased ROM, decreased strength, increased edema, impaired flexibility, postural dysfunction, obesity, and pain.    ACTIVITY LIMITATIONS carrying, lifting, bending, sitting, standing, squatting, sleeping, stairs, transfers, bed mobility, and locomotion level   PARTICIPATION LIMITATIONS: meal prep, cleaning, driving, and community activity   PERSONAL FACTORS  Age, Fitness,  Past/current experiences, and 3+ comorbidities: see PMH  are also affecting patient's functional outcome.    REHAB POTENTIAL: Good   CLINICAL DECISION MAKING: Stable/uncomplicated   EVALUATION COMPLEXITY: Low     GOALS: Goals reviewed with patient? Yes   SHORT TERM GOALS: Target date: 05/19/2022   Patient independent and verbalizes compliance with initial HEP Baseline: SEE OBJECTIVE DATA Goal status: INITIAL   2.  Patient reports 50% improvement in right knee pain. Baseline: SEE OBJECTIVE DATA Goal status: INITIAL   3.  PROM right knee extension -3* to flexion 90* Baseline: SEE OBJECTIVE DATA Goal status: INITIAL   LONG TERM GOALS: Target date: 07/07/2022   Patient will improve FOTO score to 63% Baseline: SEE OBJECTIVE DATA Goal status: INITIAL   2.  Patient reports right knee pain </= 2/10 with standing & gait activities.  Baseline: SEE OBJECTIVE DATA Goal status: INITIAL   3.  Right Knee PROM 0* extension to 100* flexion Baseline: SEE OBJECTIVE DATA Goal status: INITIAL   4.  Right Knee AROM seated -3* extension to 90* flexion Baseline: SEE OBJECTIVE DATA Goal status: INITIAL   5.  Patient ambulates >300' community distances including negotiating ramps, curbs & stairs with LRAD independently. Baseline: SEE OBJECTIVE DATA Goal status: INITIAL       PLAN: PT FREQUENCY: 2-3 x/wk   PT DURATION: 12 weeks   PLANNED INTERVENTIONS: Therapeutic exercises, Therapeutic activity, Neuromuscular re-education, Balance training, Gait training, Patient/Family education, Joint mobilization, Stair training, DME instructions, Aquatic Therapy, Electrical stimulation, Cryotherapy, Moist heat, scar mobilization, Taping, Vasopneumatic device, Manual therapy, and physical performance testing   PLAN FOR NEXT SESSION: Manual therapy & exercise emphasizing range, nu step if available, End with vaso.  Debbe Odea, PT,DPT 04/20/2022, 3:29 PM

## 2022-04-25 ENCOUNTER — Emergency Department (HOSPITAL_COMMUNITY)
Admission: EM | Admit: 2022-04-25 | Discharge: 2022-04-25 | Discharge status: Against Medical Advice | Payer: Medicare (Managed Care) | Attending: Emergency Medicine | Admitting: Emergency Medicine

## 2022-04-25 ENCOUNTER — Other Ambulatory Visit: Payer: Self-pay

## 2022-04-25 ENCOUNTER — Encounter (HOSPITAL_COMMUNITY): Payer: Self-pay

## 2022-04-25 DIAGNOSIS — M25561 Pain in right knee: Secondary | ICD-10-CM | POA: Diagnosis present

## 2022-04-25 DIAGNOSIS — Z5321 Procedure and treatment not carried out due to patient leaving prior to being seen by health care provider: Secondary | ICD-10-CM | POA: Diagnosis not present

## 2022-04-25 NOTE — ED Triage Notes (Signed)
Pt reports with soreness to her right knee after having knee surgery last Friday. Pt is worried that she has a blood clot.

## 2022-04-25 NOTE — ED Notes (Signed)
Pt told registration that she was leaving.

## 2022-04-26 ENCOUNTER — Encounter: Payer: Self-pay | Admitting: Rehabilitative and Restorative Service Providers"

## 2022-04-26 ENCOUNTER — Ambulatory Visit (INDEPENDENT_AMBULATORY_CARE_PROVIDER_SITE_OTHER): Payer: Medicare (Managed Care) | Admitting: Rehabilitative and Restorative Service Providers"

## 2022-04-26 DIAGNOSIS — R2681 Unsteadiness on feet: Secondary | ICD-10-CM

## 2022-04-26 DIAGNOSIS — R600 Localized edema: Secondary | ICD-10-CM

## 2022-04-26 DIAGNOSIS — R2689 Other abnormalities of gait and mobility: Secondary | ICD-10-CM

## 2022-04-26 DIAGNOSIS — M6281 Muscle weakness (generalized): Secondary | ICD-10-CM

## 2022-04-26 DIAGNOSIS — M25561 Pain in right knee: Secondary | ICD-10-CM

## 2022-04-26 DIAGNOSIS — M25661 Stiffness of right knee, not elsewhere classified: Secondary | ICD-10-CM | POA: Diagnosis not present

## 2022-04-26 NOTE — Therapy (Signed)
OUTPATIENT PHYSICAL THERAPY TREATMENT NOTE   Patient Name: Debbie Velasquez MRN: [id number] DOB:Mar 12, 1951, 71 y.o., female Today's Date: 04/26/2022  END OF SESSION:   PT End of Session - 04/26/22 1136     Visit Number 3    Number of Visits 28    Date for PT Re-Evaluation 07/07/22    Authorization Type [employer]    PT Start Time 1102    PT Stop Time 1152    PT Time Calculation (min) 50 min    Activity Tolerance Patient tolerated treatment well;Patient limited by fatigue;Patient limited by pain    Behavior During Therapy WFL for tasks assessed/performed              Past Medical History:  Diagnosis Date   Anemia    Arthritis    Cancer of left kidney ([place])    Left renal mass   GERD (gastroesophageal reflux disease)    History of kidney stones    x3 -remains with one on right.    Hx of seasonal allergies    rare flare ups   Hypertension    Left renal mass    Obesity    Pneumonia    hx of 20 years ago   Right ureteral stone    Type 2 diabetes mellitus ([place])    Type 2   Wears glasses    Wears partial dentures    upper   Past Surgical History:  Procedure Laterality Date   CESAREAN SECTION  yrs ago   CYSTOSCOPY WITH RETROGRADE PYELOGRAM, URETEROSCOPY AND STENT PLACEMENT Left 05/24/2018   Procedure: CYSTOSCOPY WITH RETROGRADE PYELOGRAM, URETEROSCOPY AND STENT PLACEMENT;  Surgeon: Alexis Frock, MD;  Location: [facility];  Service: Urology;  Laterality: Left;   CYSTOSCOPY WITH RETROGRADE PYELOGRAM, URETEROSCOPY AND STENT PLACEMENT Left 02/27/2020   Procedure: CYSTOSCOPY WITH RETROGRADE PYELOGRAM, URETEROSCOPY AND STENT PLACEMENT;  Surgeon: Alexis Frock, MD;  Location: [facility];  Service: Urology;  Laterality: Left;  1 HR   CYSTOSCOPY WITH RETROGRADE PYELOGRAM, URETEROSCOPY AND STENT PLACEMENT Bilateral 10/20/2020   Procedure: CYSTOSCOPY WITH RETROGRADE PYELOGRAM, URETEROSCOPY AND STENT PLACEMENT;  Surgeon: Alexis Frock, MD;  Location: [facility];  Service: Urology;   Laterality: Bilateral;  75 MINS   HOLMIUM LASER APPLICATION Left 12/26/1441   Procedure: HOLMIUM LASER APPLICATION;  Surgeon: Alexis Frock, MD;  Location: [facility];  Service: Urology;  Laterality: Left;   HOLMIUM LASER APPLICATION Left 10/27/4006   Procedure: HOLMIUM LASER APPLICATION;  Surgeon: Alexis Frock, MD;  Location: [facility];  Service: Urology;  Laterality: Left;   HOLMIUM LASER APPLICATION Bilateral 67/61/9509   Procedure: HOLMIUM LASER APPLICATION;  Surgeon: Alexis Frock, MD;  Location: [facility];  Service: Urology;  Laterality: Bilateral;   ROBOTIC ASSITED PARTIAL NEPHRECTOMY Left 12/22/2015   Procedure: XI ROBOTIC ASSITED PARTIAL NEPHRECTOMY;  Surgeon: Alexis Frock, MD;  Location: [facility];  Service: Urology;  Laterality: Left;   TOTAL ABDOMINAL HYSTERECTOMY W/ BILATERAL SALPINGOOPHORECTOMY  1980's   TOTAL KNEE ARTHROPLASTY Left 01/12/2017   Procedure: LEFT TOTAL KNEE ARTHROPLASTY and Right knee steroid injection;  Surgeon: Mcarthur Rossetti, MD;  Location: [facility];  Service: Orthopedics;  Laterality: Left;   TOTAL KNEE ARTHROPLASTY Right 04/14/2022   Procedure: RIGHT TOTAL KNEE ARTHROPLASTY;  Surgeon: Mcarthur Rossetti, MD;  Location: [facility];  Service: Orthopedics;  Laterality: Right;   Patient Active Problem List   Diagnosis Date Noted   Status post total right knee replacement 04/14/2022   Chest pain 10/09/2020   Nausea 10/09/2020  Hypertension    Type 2 diabetes mellitus ([facility])    GERD (gastroesophageal reflux disease)    Facet degeneration of lumbar region 12/18/2018   Chronic low back pain without sciatica 06/18/2017   Primary osteoarthritis of right knee 03/26/2017   Presence of left artificial knee joint 02/26/2017   Unilateral primary osteoarthritis, left knee 01/12/2017   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/12/2017   Renal mass 12/22/2015    THERAPY DIAG:  Stiffness of right knee, not elsewhere classified  Muscle weakness (generalized)  Localized  edema  Acute pain of right knee  Other abnormalities of gait and mobility  Unsteadiness on feet  PCP: Nolene Ebbs, MD   REFERRING PROVIDER: Mcarthur Rossetti, MD   REFERRING DIAG: [phone] (ICD-10-CM) - Status post right knee replacement  Rationale for Evaluation and Treatment Rehabilitation   ONSET DATE: 04/14/2022 right TKR   SUBJECTIVE:    SUBJECTIVE STATEMENT: Pt indicated she got really tender to touch in knee that led her to go to ED.  Ended up leaving due to backup in ED office    PERTINENT HISTORY: OA, left TKR 2018, left kidney CA with partial nephrectomy 2017, HTN, obesity, DM2, LBP with sciatica RLE   PAIN:  NPRS scale: today 3/10  Pain location: Rt knee/lower leg Pain description: throbbing, stinging, burning Aggravating factors: WB activity, constant throughout day Relieving factors: meds, ice, elevation   PRECAUTIONS: None   WEIGHT BEARING RESTRICTIONS No   FALLS:  Has patient fallen in last 6 months? No   LIVING ENVIRONMENT: Lives with: lives with their spouse and lives with their daughter Lives in: House/apartment Stairs: Yes: External: 2 steps; on left going up Has following equipment at home: Single point cane, Walker - 2 wheeled, and built in Civil engineer, contracting   OCCUPATION: retired   PLOF: Independent, Independent with household mobility without device, and Independent with community mobility without device   PATIENT GOALS  walk without device, water aerobics,      OBJECTIVE:    DIAGNOSTIC FINDINGS: 04/14/22 Status post right knee arthroplasty with surgical sutures about the anterior aspect of the knee. Subcutaneous emphysema as expected. No perihardware fracture.   PATIENT SURVEYS:  FOTO 04/19/22  43% with target 63%   COGNITION: Overall cognitive status: Within functional limits for tasks assessed                          SENSATION: Light touch: WFL   EDEMA:  RLE:  above knee 61.3cm around knee  55cm  below knee 43.4cm LLE:  above  knee 56.0 cm  around knee 45cm below knee  41cm   POSTURE: 6/28/203:  rounded shoulders, forward head, flexed trunk , and weight shift left   PALPATION: 6/28/203:  tenderness all areas of right knee, redness & warmth laterally and medially.    LOWER EXTREMITY ROM:   ROM P:passive  A:active Right eval Left eval   Hip flexion       Hip extension       Hip abduction       Hip adduction       Hip internal rotation       Hip external rotation       Knee flexion Supine head propped up P: 47* Seated A: 42*   Supine AROM heel slide 68 deg  Knee extension Supine P: -8* A: -30* SAQ     Ankle dorsiflexion       Ankle plantarflexion  Ankle inversion       Ankle eversion        (Blank rows = not tested)   LOWER EXTREMITY MMT:   MMT Right eval Left eval  Hip flexion      Hip extension      Hip abduction      Hip adduction      Hip internal rotation      Hip external rotation      Knee flexion 2-/5    Knee extension 2-/5    Ankle dorsiflexion      Ankle plantarflexion      Ankle inversion      Ankle eversion       (Blank rows = not tested)   BED MOBILITY:  04/19/2022  Patient requires total manual assist for RLE for sit to supine & supine to sit.     GAIT: 6/28/203:  Distance walked: 50' Assistive device utilized: Environmental consultant - 2 wheeled Level of assistance: SBA Comments: antalgic gait with decreased stance RLE, right knee flexed in stance with minimal to no increase flexion for swing.        TODAY'S TREATMENT: 04/26/2022: Therex:  Seated alternating isometric holds extension/flexion Rt knee in 70 deg 5 sec hold x 18 bilateral  Seated Rt knee LAQ c end range pause each directions x 10 (cues for routine home use)  Supine heel slide x 1 AROM   Supine heel prop TKE stretch (home cues) 90 seconds  Nustep Lvl 4 7 mins ROM to tolerance.   Church pew anterior/posterior weight shift 2 mins c SBA  Manual:  Rt knee flexion/distraction/IR mobilization c movement for Rt knee  ROM gains in sitting  Vasopneumatic  Supine head elevated 10 mins 34 deg medium compression c leg in elevation    04/20/22 -Seated LAQ X10 -Seated knee flexion AAROM heel slide using strap 5 sec X10 -seated hamstring with calf stretch with strap 10 sec X10 -Seated marches on Rt X10 -Supine knee flexion stretch on table low load long duration stretch knee popped over bolster with towel rolled up X 5 min until heel finally touches bed -Manual therapy for Rt knee PROM into flexion and extension with overpressure -Vasopnuematic device to Rt kneeX 10 min medium compression, lowest temp.   PATIENT EDUCATION:  Education details: Access Code: [email or web address] Person educated: Patient Education method: Explanation, Demonstration, Tactile cues, Verbal cues, and Handouts Education comprehension: verbalized understanding, returned demonstration, verbal cues required, tactile cues required, and needs further education     HOME EXERCISE PROGRAM: Access Code: [place] URL: [email or web address] Date: 04/19/2022 Prepared by: Jamey Reas   Exercises - Seated Knee Flexion Extension AROM   - 2-4 x daily - 7 x weekly - 2 sets - 10 reps - 5 seconds hold - Seated Hamstring Stretch with Strap  - 2-4 x daily - 7 x weekly - 1 sets - 2-3 reps - 30 seconds hold - Seated Quad Set  - 2-4 x daily - 7 x weekly - 2 sets - 10 reps - 5 seconds hold - Supine Short Arc Quad  - 2-4 x daily - 7 x weekly - 2 sets - 10 reps - 5 seconds hold - Supine Heel Slide with Strap  - 2-4 x daily - 7 x weekly - 2 sets - 10 reps - 5 seconds hold - Quad Setting and Stretching  - 2-4 x daily - 7 x weekly - 5-10 sets - 10 reps - prop 5-10 minutes & quad set5 seconds hold  ASSESSMENT:   CLINICAL IMPRESSION: No posterior calf tightness noted on inspection today.  Pt continued to present c Rt knee pain c mobility, strength and balance control deficits which impair functional activity tolerance and performance.  Continued  skilled PT services indicated.   OBJECTIVE IMPAIRMENTS Abnormal gait, decreased activity tolerance, decreased balance, decreased endurance, decreased knowledge of use of DME, decreased mobility, difficulty walking, decreased ROM, decreased strength, increased edema, impaired flexibility, postural dysfunction, obesity, and pain.    ACTIVITY LIMITATIONS carrying, lifting, bending, sitting, standing, squatting, sleeping, stairs, transfers, bed mobility, and locomotion level   PARTICIPATION LIMITATIONS: meal prep, cleaning, driving, and community activity   PERSONAL FACTORS Age, Fitness, Past/current experiences, and 3+ comorbidities: see PMH  are also affecting patient's functional outcome.    REHAB POTENTIAL: Good   CLINICAL DECISION MAKING: Stable/uncomplicated   EVALUATION COMPLEXITY: Low     GOALS: Goals reviewed with patient? Yes   SHORT TERM GOALS: Target date: 05/19/2022   Patient independent and verbalizes compliance with initial HEP Baseline: SEE OBJECTIVE DATA Goal status: on going - 04/26/2022   2.  Patient reports 50% improvement in right knee pain. Baseline: SEE OBJECTIVE DATA Goal status: on going - 04/26/2022   3.  PROM right knee extension -3* to flexion 90* Baseline: SEE OBJECTIVE DATA Goal status: on going - 04/26/2022   LONG TERM GOALS: Target date: 07/07/2022   Patient will improve FOTO score to 63% Baseline: SEE OBJECTIVE DATA Goal status: INITIAL   2.  Patient reports right knee pain </= 2/10 with standing & gait activities.  Baseline: SEE OBJECTIVE DATA Goal status: INITIAL   3.  Right Knee PROM 0* extension to 100* flexion Baseline: SEE OBJECTIVE DATA Goal status: INITIAL   4.  Right Knee AROM seated -3* extension to 90* flexion Baseline: SEE OBJECTIVE DATA Goal status: INITIAL   5.  Patient ambulates >300' community distances including negotiating ramps, curbs & stairs with LRAD independently. Baseline: SEE OBJECTIVE DATA Goal status: INITIAL        PLAN: PT FREQUENCY: 2-3 x/wk   PT DURATION: 12 weeks   PLANNED INTERVENTIONS: Therapeutic exercises, Therapeutic activity, Neuromuscular re-education, Balance training, Gait training, Patient/Family education, Joint mobilization, Stair training, DME instructions, Aquatic Therapy, Electrical stimulation, Cryotherapy, Moist heat, scar mobilization, Taping, Vasopneumatic device, Manual therapy, and physical performance testing   PLAN FOR NEXT SESSION: Progressive improvements in mobility, strength, early balance activity.  MD updated measurements note.   Scot Jun, PT, DPT, OCS, ATC 04/26/22  11:50 AM

## 2022-04-27 ENCOUNTER — Encounter: Payer: Self-pay | Admitting: Orthopaedic Surgery

## 2022-04-27 ENCOUNTER — Ambulatory Visit (INDEPENDENT_AMBULATORY_CARE_PROVIDER_SITE_OTHER): Payer: Medicare (Managed Care) | Admitting: Rehabilitative and Restorative Service Providers"

## 2022-04-27 ENCOUNTER — Encounter: Payer: Self-pay | Admitting: Rehabilitative and Restorative Service Providers"

## 2022-04-27 ENCOUNTER — Ambulatory Visit (INDEPENDENT_AMBULATORY_CARE_PROVIDER_SITE_OTHER): Payer: Medicare (Managed Care) | Admitting: Orthopaedic Surgery

## 2022-04-27 DIAGNOSIS — R600 Localized edema: Secondary | ICD-10-CM | POA: Diagnosis not present

## 2022-04-27 DIAGNOSIS — Z96651 Presence of right artificial knee joint: Secondary | ICD-10-CM

## 2022-04-27 DIAGNOSIS — M25561 Pain in right knee: Secondary | ICD-10-CM | POA: Diagnosis not present

## 2022-04-27 DIAGNOSIS — M6281 Muscle weakness (generalized): Secondary | ICD-10-CM | POA: Diagnosis not present

## 2022-04-27 DIAGNOSIS — R2681 Unsteadiness on feet: Secondary | ICD-10-CM

## 2022-04-27 DIAGNOSIS — M25661 Stiffness of right knee, not elsewhere classified: Secondary | ICD-10-CM | POA: Diagnosis not present

## 2022-04-27 DIAGNOSIS — R2689 Other abnormalities of gait and mobility: Secondary | ICD-10-CM

## 2022-04-27 MED ORDER — OXYCODONE HCL 5 MG PO TABS: 5.0000 mg | ORAL_TABLET | ORAL | 0 refills | Status: DC | PRN

## 2022-04-27 NOTE — Progress Notes (Signed)
The patient is here for first postoperative visit status post a right knee replacement.  We replaced her left knee years ago.  She said that left knee replacement was easier and she is having a harder time with this right knee.  She has been compliant with going to physical therapy as an outpatient.  Their note stated they have flexed her today to 68 degrees.  She knows that she needs to get further than that.  Some of this may be limited by her pain and the staples.  Her incision looks good so the staples were removed and Steri-Strips applied.  Her calf is soft.  She has been compliant with an aspirin twice a day.  She will go back to just once a day that she was on prior to surgery.  I will send in some more oxycodone for her.  This is appropriate at this point postoperative.  She knows to wait to drive at least 2 weeks from now and to not have narcotics in her system when she gets behind the wheel.  We will see her back in 4 weeks from now for repeat evaluation but no x-rays are needed.

## 2022-04-27 NOTE — Therapy (Signed)
OUTPATIENT PHYSICAL THERAPY TREATMENT NOTE   Patient Name: Debbie Velasquez MRN: [id number] DOB:1951-04-01, 71 y.o., female Today's Date: 04/27/2022  END OF SESSION:   PT End of Session - 04/27/22 0940     Visit Number 4    Number of Visits 28    Date for PT Re-Evaluation 07/07/22    Authorization Type [employer]    PT Start Time [phone]    PT Stop Time 1021    PT Time Calculation (min) 50 min    Activity Tolerance Patient tolerated treatment well;Patient limited by fatigue;Patient limited by pain    Behavior During Therapy [facility] for tasks assessed/performed               Past Medical History:  Diagnosis Date   Anemia    Arthritis    Cancer of left kidney ([place])    Left renal mass   GERD (gastroesophageal reflux disease)    History of kidney stones    x3 -remains with one on right.    Hx of seasonal allergies    rare flare ups   Hypertension    Left renal mass    Obesity    Pneumonia    hx of 20 years ago   Right ureteral stone    Type 2 diabetes mellitus ([place])    Type 2   Wears glasses    Wears partial dentures    upper   Past Surgical History:  Procedure Laterality Date   CESAREAN SECTION  yrs ago   CYSTOSCOPY WITH RETROGRADE PYELOGRAM, URETEROSCOPY AND STENT PLACEMENT Left 05/24/2018   Procedure: CYSTOSCOPY WITH RETROGRADE PYELOGRAM, URETEROSCOPY AND STENT PLACEMENT;  Surgeon: Alexis Frock, MD;  Location: [facility];  Service: Urology;  Laterality: Left;   CYSTOSCOPY WITH RETROGRADE PYELOGRAM, URETEROSCOPY AND STENT PLACEMENT Left 02/27/2020   Procedure: CYSTOSCOPY WITH RETROGRADE PYELOGRAM, URETEROSCOPY AND STENT PLACEMENT;  Surgeon: Alexis Frock, MD;  Location: [facility];  Service: Urology;  Laterality: Left;  1 HR   CYSTOSCOPY WITH RETROGRADE PYELOGRAM, URETEROSCOPY AND STENT PLACEMENT Bilateral 10/20/2020   Procedure: CYSTOSCOPY WITH RETROGRADE PYELOGRAM, URETEROSCOPY AND STENT PLACEMENT;  Surgeon: Alexis Frock, MD;  Location: [facility];  Service:  Urology;  Laterality: Bilateral;  75 MINS   HOLMIUM LASER APPLICATION Left 12/23/3555   Procedure: HOLMIUM LASER APPLICATION;  Surgeon: Alexis Frock, MD;  Location: [facility];  Service: Urology;  Laterality: Left;   HOLMIUM LASER APPLICATION Left 12/23/2023   Procedure: HOLMIUM LASER APPLICATION;  Surgeon: Alexis Frock, MD;  Location: [facility];  Service: Urology;  Laterality: Left;   HOLMIUM LASER APPLICATION Bilateral 42/70/6237   Procedure: HOLMIUM LASER APPLICATION;  Surgeon: Alexis Frock, MD;  Location: [facility];  Service: Urology;  Laterality: Bilateral;   ROBOTIC ASSITED PARTIAL NEPHRECTOMY Left 12/22/2015   Procedure: XI ROBOTIC ASSITED PARTIAL NEPHRECTOMY;  Surgeon: Alexis Frock, MD;  Location: [facility];  Service: Urology;  Laterality: Left;   TOTAL ABDOMINAL HYSTERECTOMY W/ BILATERAL SALPINGOOPHORECTOMY  1980's   TOTAL KNEE ARTHROPLASTY Left 01/12/2017   Procedure: LEFT TOTAL KNEE ARTHROPLASTY and Right knee steroid injection;  Surgeon: Mcarthur Rossetti, MD;  Location: [facility];  Service: Orthopedics;  Laterality: Left;   TOTAL KNEE ARTHROPLASTY Right 04/14/2022   Procedure: RIGHT TOTAL KNEE ARTHROPLASTY;  Surgeon: Mcarthur Rossetti, MD;  Location: [facility];  Service: Orthopedics;  Laterality: Right;   Patient Active Problem List   Diagnosis Date Noted   Status post total right knee replacement 04/14/2022   Chest pain 10/09/2020   Nausea 10/09/2020  Hypertension    Type 2 diabetes mellitus ([facility])    GERD (gastroesophageal reflux disease)    Facet degeneration of lumbar region 12/18/2018   Chronic low back pain without sciatica 06/18/2017   Primary osteoarthritis of right knee 03/26/2017   Presence of left artificial knee joint 02/26/2017   Unilateral primary osteoarthritis, left knee 01/12/2017   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/12/2017   Renal mass 12/22/2015    THERAPY DIAG:  Stiffness of right knee, not elsewhere classified  Muscle weakness  (generalized)  Localized edema  Acute pain of right knee  Other abnormalities of gait and mobility  Unsteadiness on feet  PCP: Nolene Ebbs, MD   REFERRING PROVIDER: Mcarthur Rossetti, MD   REFERRING DIAG: [phone] (ICD-10-CM) - Status post right knee replacement  Rationale for Evaluation and Treatment Rehabilitation   ONSET DATE: 04/14/2022 right TKR   SUBJECTIVE:    SUBJECTIVE STATEMENT: Pt indicated arrival symptoms about 3/10.  Nothing worst from yesterday.  Still sore.    PERTINENT HISTORY: OA, left TKR 2018, left kidney CA with partial nephrectomy 2017, HTN, obesity, DM2, LBP with sciatica RLE   PAIN:  NPRS scale: today 3/10  Pain location: Rt knee/lower leg Pain description: throbbing, stinging, burning Aggravating factors: WB activity, constant throughout day Relieving factors: meds, ice, elevation   PRECAUTIONS: None   WEIGHT BEARING RESTRICTIONS No   FALLS:  Has patient fallen in last 6 months? No   LIVING ENVIRONMENT: Lives with: lives with their spouse and lives with their daughter Lives in: House/apartment Stairs: Yes: External: 2 steps; on left going up Has following equipment at home: Single point cane, Walker - 2 wheeled, and built in Civil engineer, contracting   OCCUPATION: retired   PLOF: Independent, Independent with household mobility without device, and Independent with community mobility without device   PATIENT GOALS  walk without device, water aerobics,      OBJECTIVE:    DIAGNOSTIC FINDINGS: 04/14/22 Status post right knee arthroplasty with surgical sutures about the anterior aspect of the knee. Subcutaneous emphysema as expected. No perihardware fracture.   PATIENT SURVEYS:  04/19/2022 FOTO   43% with target 63%   COGNITION: 04/19/2022 Overall cognitive status: Within functional limits for tasks assessed                          SENSATION: 04/19/2022 Light touch: WFL   EDEMA:  04/19/2022 RLE:  above knee 61.3cm around knee  55cm  below  knee 43.4cm LLE:  above knee 56.0 cm  around knee 45cm below knee  41cm   POSTURE: 6/28/203:  rounded shoulders, forward head, flexed trunk , and weight shift left   PALPATION: 6/28/203:  tenderness all areas of right knee, redness & warmth laterally and medially.    LOWER EXTREMITY ROM:   ROM P:passive  A:active Right 04/19/2022 Left 04/19/2022 04/26/2022  Hip flexion       Hip extension       Hip abduction       Hip adduction       Hip internal rotation       Hip external rotation       Knee flexion Supine head propped up P: 47* Seated A: 42*   Supine AROM heel slide 68 deg  Knee extension Supine P: -8* A: -30* SAQ     Ankle dorsiflexion       Ankle plantarflexion       Ankle inversion  Ankle eversion        (Blank rows = not tested)   LOWER EXTREMITY MMT:   MMT Right 04/19/2022 Left 04/19/2022  Hip flexion      Hip extension      Hip abduction      Hip adduction      Hip internal rotation      Hip external rotation      Knee flexion 2-/5    Knee extension 2-/5    Ankle dorsiflexion      Ankle plantarflexion      Ankle inversion      Ankle eversion       (Blank rows = not tested)   BED MOBILITY:  04/19/2022  Patient requires total manual assist for RLE for sit to supine & supine to sit.     GAIT: 6/28/203:  Distance walked: 50' Assistive device utilized: Environmental consultant - 2 wheeled Level of assistance: SBA Comments: antalgic gait with decreased stance RLE, right knee flexed in stance with minimal to no increase flexion for swing.        TODAY'S TREATMENT: 04/27/2022: Therex:  Seated alternating isometric holds extension/flexion Rt knee in 70 deg 5 sec hold x 18 bilateral  Seated Rt knee LAQ c end range pause each directions 2 x 10 1.5 lbs  Seated Rt leg hamstring stretch c strap (foot on ground) 15 sec x 5  Seated quad set Rt 5 sec hold x 10   Sit to stand to sit no UE assist 23 inch table height 2 x 5  Standing runner stretch at wall 15 sec x 5 Rt leg  posterior  [employer] pew anterior/posterior weight shift 2 mins c SBA  Manual:  Rt knee flexion/distraction/IR mobilization c movement for Rt knee ROM gains in sitting.  Contract/relax to Rt quad for flexion gains.   Vasopneumatic  Supine head elevated 10 mins 34 deg medium compression c leg in elevation  04/26/2022: Therex:  Seated alternating isometric holds extension/flexion Rt knee in 70 deg 5 sec hold x 18 bilateral  Seated Rt knee LAQ c end range pause each directions x 10 (cues for routine home use)  Supine heel slide x 1 AROM   Supine heel prop TKE stretch (home cues) 90 seconds  Nustep Lvl 4 7 mins ROM to tolerance.   [employer] pew anterior/posterior weight shift 2 mins c SBA  Manual:  Rt knee flexion/distraction/IR mobilization c movement for Rt knee ROM gains in sitting  Vasopneumatic  Supine head elevated 10 mins 34 deg medium compression c leg in elevation    04/20/22 -Seated LAQ X10 -Seated knee flexion AAROM heel slide using strap 5 sec X10 -seated hamstring with calf stretch with strap 10 sec X10 -Seated marches on Rt X10 -Supine knee flexion stretch on table low load long duration stretch knee popped over bolster with towel rolled up X 5 min until heel finally touches bed -Manual therapy for Rt knee PROM into flexion and extension with overpressure -Vasopnuematic device to Rt kneeX 10 min medium compression, lowest temp.   PATIENT EDUCATION:  Education details: Access Code: [email or web address] Person educated: Patient Education method: Explanation, Demonstration, Tactile cues, Verbal cues, and Handouts Education comprehension: verbalized understanding, returned demonstration, verbal cues required, tactile cues required, and needs further education     HOME EXERCISE PROGRAM: Access Code: [place] URL: [email or web address] Date: 04/19/2022 Prepared by: Jamey Reas   Exercises - Seated Knee Flexion Extension AROM   - 2-4 x daily - 7 x weekly - 2  sets - 10  reps - 5 seconds hold - Seated Hamstring Stretch with Strap  - 2-4 x daily - 7 x weekly - 1 sets - 2-3 reps - 30 seconds hold - Seated Quad Set  - 2-4 x daily - 7 x weekly - 2 sets - 10 reps - 5 seconds hold - Supine Short Arc Quad  - 2-4 x daily - 7 x weekly - 2 sets - 10 reps - 5 seconds hold - Supine Heel Slide with Strap  - 2-4 x daily - 7 x weekly - 2 sets - 10 reps - 5 seconds hold - Quad Setting and Stretching  - 2-4 x daily - 7 x weekly - 5-10 sets - 10 reps - prop 5-10 minutes & quad set5 seconds hold   ASSESSMENT:   CLINICAL IMPRESSION: Pt continued to present c strength, mobility and movement coordination deficits which impair functional activity and independence level.  Early gains noted in activation of quad in none weighted strengthening.  Continued skilled PT services warranted at this time.   OBJECTIVE IMPAIRMENTS Abnormal gait, decreased activity tolerance, decreased balance, decreased endurance, decreased knowledge of use of DME, decreased mobility, difficulty walking, decreased ROM, decreased strength, increased edema, impaired flexibility, postural dysfunction, obesity, and pain.    ACTIVITY LIMITATIONS carrying, lifting, bending, sitting, standing, squatting, sleeping, stairs, transfers, bed mobility, and locomotion level   PARTICIPATION LIMITATIONS: meal prep, cleaning, driving, and community activity   PERSONAL FACTORS Age, Fitness, Past/current experiences, and 3+ comorbidities: see PMH  are also affecting patient's functional outcome.    REHAB POTENTIAL: Good   CLINICAL DECISION MAKING: Stable/uncomplicated   EVALUATION COMPLEXITY: Low     GOALS: Goals reviewed with patient? Yes   SHORT TERM GOALS: Target date: 05/19/2022   Patient independent and verbalizes compliance with initial HEP Baseline: SEE OBJECTIVE DATA Goal status: on going - 04/26/2022   2.  Patient reports 50% improvement in right knee pain. Baseline: SEE OBJECTIVE DATA Goal status: on going -  04/26/2022   3.  PROM right knee extension -3* to flexion 90* Baseline: SEE OBJECTIVE DATA Goal status: on going - 04/26/2022   LONG TERM GOALS: Target date: 07/07/2022   Patient will improve FOTO score to 63% Baseline: SEE OBJECTIVE DATA Goal status: INITIAL   2.  Patient reports right knee pain </= 2/10 with standing & gait activities.  Baseline: SEE OBJECTIVE DATA Goal status: INITIAL   3.  Right Knee PROM 0* extension to 100* flexion Baseline: SEE OBJECTIVE DATA Goal status: INITIAL   4.  Right Knee AROM seated -3* extension to 90* flexion Baseline: SEE OBJECTIVE DATA Goal status: INITIAL   5.  Patient ambulates >300' community distances including negotiating ramps, curbs & stairs with LRAD independently. Baseline: SEE OBJECTIVE DATA Goal status: INITIAL       PLAN: PT FREQUENCY: 2-3 x/wk   PT DURATION: 12 weeks   PLANNED INTERVENTIONS: Therapeutic exercises, Therapeutic activity, Neuromuscular re-education, Balance training, Gait training, Patient/Family education, Joint mobilization, Stair training, DME instructions, Aquatic Therapy, Electrical stimulation, Cryotherapy, Moist heat, scar mobilization, Taping, Vasopneumatic device, Manual therapy, and physical performance testing   PLAN FOR NEXT SESSION: Manual for mobility/pain improvements.  Progressive strengthening as tolerated.   Scot Jun, PT, DPT, OCS, ATC 04/27/22  10:11 AM

## 2022-04-28 ENCOUNTER — Encounter: Payer: Self-pay | Admitting: Rehabilitative and Restorative Service Providers"

## 2022-04-28 ENCOUNTER — Ambulatory Visit (INDEPENDENT_AMBULATORY_CARE_PROVIDER_SITE_OTHER): Payer: Medicare (Managed Care) | Admitting: Rehabilitative and Restorative Service Providers"

## 2022-04-28 DIAGNOSIS — R2681 Unsteadiness on feet: Secondary | ICD-10-CM

## 2022-04-28 DIAGNOSIS — M6281 Muscle weakness (generalized): Secondary | ICD-10-CM

## 2022-04-28 DIAGNOSIS — M25561 Pain in right knee: Secondary | ICD-10-CM | POA: Diagnosis not present

## 2022-04-28 DIAGNOSIS — R600 Localized edema: Secondary | ICD-10-CM

## 2022-04-28 DIAGNOSIS — M25661 Stiffness of right knee, not elsewhere classified: Secondary | ICD-10-CM | POA: Diagnosis not present

## 2022-04-28 DIAGNOSIS — R2689 Other abnormalities of gait and mobility: Secondary | ICD-10-CM

## 2022-04-28 NOTE — Therapy (Signed)
OUTPATIENT PHYSICAL THERAPY TREATMENT NOTE   Patient Name: Debbie Velasquez MRN: [id number] DOB:05/01/1951, 71 y.o., female Today's Date: 04/28/2022  END OF SESSION:   PT End of Session - 04/28/22 1348     Visit Number 5    Number of Visits 28    Date for PT Re-Evaluation 07/07/22    Authorization Type [employer]    PT Start Time 1341    PT Stop Time 1431    PT Time Calculation (min) 50 min    Activity Tolerance Patient tolerated treatment well;Patient limited by fatigue;Patient limited by pain    Behavior During Therapy WFL for tasks assessed/performed                Past Medical History:  Diagnosis Date   Anemia    Arthritis    Cancer of left kidney ([place])    Left renal mass   GERD (gastroesophageal reflux disease)    History of kidney stones    x3 -remains with one on right.    Hx of seasonal allergies    rare flare ups   Hypertension    Left renal mass    Obesity    Pneumonia    hx of 20 years ago   Right ureteral stone    Type 2 diabetes mellitus ([place])    Type 2   Wears glasses    Wears partial dentures    upper   Past Surgical History:  Procedure Laterality Date   CESAREAN SECTION  yrs ago   CYSTOSCOPY WITH RETROGRADE PYELOGRAM, URETEROSCOPY AND STENT PLACEMENT Left 05/24/2018   Procedure: CYSTOSCOPY WITH RETROGRADE PYELOGRAM, URETEROSCOPY AND STENT PLACEMENT;  Surgeon: Alexis Frock, MD;  Location: [facility];  Service: Urology;  Laterality: Left;   CYSTOSCOPY WITH RETROGRADE PYELOGRAM, URETEROSCOPY AND STENT PLACEMENT Left 02/27/2020   Procedure: CYSTOSCOPY WITH RETROGRADE PYELOGRAM, URETEROSCOPY AND STENT PLACEMENT;  Surgeon: Alexis Frock, MD;  Location: [facility];  Service: Urology;  Laterality: Left;  1 HR   CYSTOSCOPY WITH RETROGRADE PYELOGRAM, URETEROSCOPY AND STENT PLACEMENT Bilateral 10/20/2020   Procedure: CYSTOSCOPY WITH RETROGRADE PYELOGRAM, URETEROSCOPY AND STENT PLACEMENT;  Surgeon: Alexis Frock, MD;  Location: [facility];  Service:  Urology;  Laterality: Bilateral;  75 MINS   HOLMIUM LASER APPLICATION Left 12/23/8248   Procedure: HOLMIUM LASER APPLICATION;  Surgeon: Alexis Frock, MD;  Location: [facility];  Service: Urology;  Laterality: Left;   HOLMIUM LASER APPLICATION Left 02/22/9766   Procedure: HOLMIUM LASER APPLICATION;  Surgeon: Alexis Frock, MD;  Location: [facility];  Service: Urology;  Laterality: Left;   HOLMIUM LASER APPLICATION Bilateral 34/19/3790   Procedure: HOLMIUM LASER APPLICATION;  Surgeon: Alexis Frock, MD;  Location: [facility];  Service: Urology;  Laterality: Bilateral;   ROBOTIC ASSITED PARTIAL NEPHRECTOMY Left 12/22/2015   Procedure: XI ROBOTIC ASSITED PARTIAL NEPHRECTOMY;  Surgeon: Alexis Frock, MD;  Location: [facility];  Service: Urology;  Laterality: Left;   TOTAL ABDOMINAL HYSTERECTOMY W/ BILATERAL SALPINGOOPHORECTOMY  1980's   TOTAL KNEE ARTHROPLASTY Left 01/12/2017   Procedure: LEFT TOTAL KNEE ARTHROPLASTY and Right knee steroid injection;  Surgeon: Mcarthur Rossetti, MD;  Location: [facility];  Service: Orthopedics;  Laterality: Left;   TOTAL KNEE ARTHROPLASTY Right 04/14/2022   Procedure: RIGHT TOTAL KNEE ARTHROPLASTY;  Surgeon: Mcarthur Rossetti, MD;  Location: [facility];  Service: Orthopedics;  Laterality: Right;   Patient Active Problem List   Diagnosis Date Noted   Status post total right knee replacement 04/14/2022   Chest pain 10/09/2020   Nausea  10/09/2020   Hypertension    Type 2 diabetes mellitus ([facility])    GERD (gastroesophageal reflux disease)    Facet degeneration of lumbar region 12/18/2018   Chronic low back pain without sciatica 06/18/2017   Primary osteoarthritis of right knee 03/26/2017   Presence of left artificial knee joint 02/26/2017   Unilateral primary osteoarthritis, left knee 01/12/2017   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/12/2017   Renal mass 12/22/2015    THERAPY DIAG:  Stiffness of right knee, not elsewhere classified  Muscle weakness  (generalized)  Localized edema  Acute pain of right knee  Other abnormalities of gait and mobility  Unsteadiness on feet  PCP: Nolene Ebbs, MD   REFERRING PROVIDER: Mcarthur Rossetti, MD   REFERRING DIAG: [phone] (ICD-10-CM) - Status post right knee replacement  Rationale for Evaluation and Treatment Rehabilitation   ONSET DATE: 04/14/2022 right TKR   SUBJECTIVE:    SUBJECTIVE STATEMENT: Pt indicated arrival symptoms about 3/10.  Nothing worst from yesterday.  Still sore.    PERTINENT HISTORY: OA, left TKR 2018, left kidney CA with partial nephrectomy 2017, HTN, obesity, DM2, LBP with sciatica RLE   PAIN:  NPRS scale: today 3/10  Pain location: Rt knee/lower leg Pain description: throbbing, stinging, burning Aggravating factors: WB activity, constant throughout day Relieving factors: meds, ice, elevation   PRECAUTIONS: None   WEIGHT BEARING RESTRICTIONS No   FALLS:  Has patient fallen in last 6 months? No   LIVING ENVIRONMENT: Lives with: lives with their spouse and lives with their daughter Lives in: House/apartment Stairs: Yes: External: 2 steps; on left going up Has following equipment at home: Single point cane, Walker - 2 wheeled, and built in Civil engineer, contracting   OCCUPATION: retired   PLOF: Independent, Independent with household mobility without device, and Independent with community mobility without device   PATIENT GOALS  walk without device, water aerobics,      OBJECTIVE:    DIAGNOSTIC FINDINGS: 04/14/22 Status post right knee arthroplasty with surgical sutures about the anterior aspect of the knee. Subcutaneous emphysema as expected. No perihardware fracture.   PATIENT SURVEYS:  04/19/2022 FOTO   43% with target 63%   COGNITION: 04/19/2022 Overall cognitive status: Within functional limits for tasks assessed                          SENSATION: 04/19/2022 Light touch: WFL   EDEMA:  04/19/2022 RLE:  above knee 61.3cm around knee  55cm  below  knee 43.4cm LLE:  above knee 56.0 cm  around knee 45cm below knee  41cm   POSTURE: 6/28/203:  rounded shoulders, forward head, flexed trunk , and weight shift left   PALPATION: 6/28/203:  tenderness all areas of right knee, redness & warmth laterally and medially.    LOWER EXTREMITY ROM:   ROM P:passive  A:active Right 04/19/2022 Left 04/19/2022 Right 04/26/2022  Hip flexion       Hip extension       Hip abduction       Hip adduction       Hip internal rotation       Hip external rotation       Knee flexion Supine head propped up P: 47* Seated A: 42*   Supine AROM heel slide 68 deg  Knee extension Supine P: -8* A: -30* SAQ     Ankle dorsiflexion       Ankle plantarflexion       Ankle  inversion       Ankle eversion        (Blank rows = not tested)   LOWER EXTREMITY MMT:   MMT Right 04/19/2022 Left 04/19/2022  Hip flexion      Hip extension      Hip abduction      Hip adduction      Hip internal rotation      Hip external rotation      Knee flexion 2-/5    Knee extension 2-/5    Ankle dorsiflexion      Ankle plantarflexion      Ankle inversion      Ankle eversion       (Blank rows = not tested)   BED MOBILITY:  04/19/2022  Patient requires total manual assist for RLE for sit to supine & supine to sit.     GAIT: 04/28/2022:  FWW ambulation continued in clinic  6/28/203:  Distance walked: 50' Assistive device utilized: Environmental consultant - 2 wheeled Level of assistance: SBA Comments: antalgic gait with decreased stance RLE, right knee flexed in stance with minimal to no increase flexion for swing.        TODAY'S TREATMENT: 04/28/2022: Therex:  Nustep Lvl 5 9 mins UE/LE  Leg press double leg 50 lbs 2 x 10 c slow lowering focus Seated alternating isometric holds extension/flexion Rt knee in 70 deg 5 sec hold x 18 bilateral  Seated Rt knee LAQ c end range pause each directions 3 x 10 2 lbs  Seated Rt leg hamstring stretch c strap (foot on ground) 15 sec x 5  Supine heel  slide c strap 10 sec x 8     Neuro re-ed:  Modified tandem stance (leg forward but not directly in line) 1 min x 1 bilateral Retro step Rt leg posterior x 10   Vasopneumatic  Supine head elevated 10 mins 34 deg medium compression c leg in elevation  04/27/2022: Therex:  Seated alternating isometric holds extension/flexion Rt knee in 70 deg 5 sec hold x 18 bilateral  Seated Rt knee LAQ c end range pause each directions 2 x 10 1.5 lbs  Seated Rt leg hamstring stretch c strap (foot on ground) 15 sec x 5  Seated quad set Rt 5 sec hold x 10   Sit to stand to sit no UE assist 23 inch table height 2 x 5  Standing runner stretch at wall 15 sec x 5 Rt leg posterior  [employer] pew anterior/posterior weight shift 2 mins c SBA  Manual:  Rt knee flexion/distraction/IR mobilization c movement for Rt knee ROM gains in sitting.  Contract/relax to Rt quad for flexion gains.   Vasopneumatic  Supine head elevated 10 mins 34 deg medium compression c leg in elevation  04/26/2022: Therex:  Seated alternating isometric holds extension/flexion Rt knee in 70 deg 5 sec hold x 18 bilateral  Seated Rt knee LAQ c end range pause each directions x 10 (cues for routine home use)  Supine heel slide x 1 AROM   Supine heel prop TKE stretch (home cues) 90 seconds  Nustep Lvl 4 7 mins ROM to tolerance.   [employer] pew anterior/posterior weight shift 2 mins c SBA  Manual:  Rt knee flexion/distraction/IR mobilization c movement for Rt knee ROM gains in sitting  Vasopneumatic  Supine head elevated 10 mins 34 deg medium compression c leg in elevation      PATIENT EDUCATION:  Education details: Access Code: [place] educated: Patient Education method: Explanation,  Demonstration, Tactile cues, Verbal cues, and Handouts Education comprehension: verbalized understanding, returned demonstration, verbal cues required, tactile cues required, and needs further education     HOME EXERCISE PROGRAM: Access Code:  [place]: [email or web address] Date: 04/19/2022 Prepared by: Jamey Reas   Exercises - Seated Knee Flexion Extension AROM   - 2-4 x daily - 7 x weekly - 2 sets - 10 reps - 5 seconds hold - Seated Hamstring Stretch with Strap  - 2-4 x daily - 7 x weekly - 1 sets - 2-3 reps - 30 seconds hold - Seated Quad Set  - 2-4 x daily - 7 x weekly - 2 sets - 10 reps - 5 seconds hold - Supine Short Arc Quad  - 2-4 x daily - 7 x weekly - 2 sets - 10 reps - 5 seconds hold - Supine Heel Slide with Strap  - 2-4 x daily - 7 x weekly - 2 sets - 10 reps - 5 seconds hold - Quad Setting and Stretching  - 2-4 x daily - 7 x weekly - 5-10 sets - 10 reps - prop 5-10 minutes & quad set5 seconds hold   ASSESSMENT:   CLINICAL IMPRESSION: Continued progression in mobility and strength intervention as tolerated.  Encouraged increased WB activity today in clinic c leg press and balance intervention in standing.  Continued skilled PT services recommended for mobility, strength and balance improvements.   OBJECTIVE IMPAIRMENTS Abnormal gait, decreased activity tolerance, decreased balance, decreased endurance, decreased knowledge of use of DME, decreased mobility, difficulty walking, decreased ROM, decreased strength, increased edema, impaired flexibility, postural dysfunction, obesity, and pain.    ACTIVITY LIMITATIONS carrying, lifting, bending, sitting, standing, squatting, sleeping, stairs, transfers, bed mobility, and locomotion level   PARTICIPATION LIMITATIONS: meal prep, cleaning, driving, and community activity   PERSONAL FACTORS Age, Fitness, Past/current experiences, and 3+ comorbidities: see PMH  are also affecting patient's functional outcome.    REHAB POTENTIAL: Good   CLINICAL DECISION MAKING: Stable/uncomplicated   EVALUATION COMPLEXITY: Low     GOALS: Goals reviewed with patient? Yes   SHORT TERM GOALS: Target date: 05/19/2022   Patient independent and verbalizes compliance  with initial HEP Baseline: SEE OBJECTIVE DATA Goal status: on going - 04/26/2022   2.  Patient reports 50% improvement in right knee pain. Baseline: SEE OBJECTIVE DATA Goal status: on going - 04/26/2022   3.  PROM right knee extension -3* to flexion 90* Baseline: SEE OBJECTIVE DATA Goal status: on going - 04/26/2022   LONG TERM GOALS: Target date: 07/07/2022   Patient will improve FOTO score to 63% Baseline: SEE OBJECTIVE DATA Goal status: INITIAL   2.  Patient reports right knee pain </= 2/10 with standing & gait activities.  Baseline: SEE OBJECTIVE DATA Goal status: INITIAL   3.  Right Knee PROM 0* extension to 100* flexion Baseline: SEE OBJECTIVE DATA Goal status: INITIAL   4.  Right Knee AROM seated -3* extension to 90* flexion Baseline: SEE OBJECTIVE DATA Goal status: INITIAL   5.  Patient ambulates >300' community distances including negotiating ramps, curbs & stairs with LRAD independently. Baseline: SEE OBJECTIVE DATA Goal status: INITIAL       PLAN: PT FREQUENCY: 2-3 x/wk   PT DURATION: 12 weeks   PLANNED INTERVENTIONS: Therapeutic exercises, Therapeutic activity, Neuromuscular re-education, Balance training, Gait training, Patient/Family education, Joint mobilization, Stair training, DME instructions, Aquatic Therapy, Electrical stimulation, Cryotherapy, Moist heat, scar mobilization, Taping, Vasopneumatic device, Manual therapy, and physical performance testing   PLAN FOR  NEXT SESSION: Manual for mobility/pain improvements.  Progressive strengthening as tolerated c static balance interventions  Scot Jun, PT, DPT, OCS, ATC 04/28/22  2:17 PM

## 2022-05-01 ENCOUNTER — Ambulatory Visit (INDEPENDENT_AMBULATORY_CARE_PROVIDER_SITE_OTHER): Payer: Medicare (Managed Care) | Admitting: Physical Therapy

## 2022-05-01 ENCOUNTER — Encounter: Payer: Self-pay | Admitting: Physical Therapy

## 2022-05-01 DIAGNOSIS — M6281 Muscle weakness (generalized): Secondary | ICD-10-CM | POA: Diagnosis not present

## 2022-05-01 DIAGNOSIS — M25561 Pain in right knee: Secondary | ICD-10-CM

## 2022-05-01 DIAGNOSIS — R2689 Other abnormalities of gait and mobility: Secondary | ICD-10-CM

## 2022-05-01 DIAGNOSIS — M25661 Stiffness of right knee, not elsewhere classified: Secondary | ICD-10-CM

## 2022-05-01 DIAGNOSIS — R600 Localized edema: Secondary | ICD-10-CM

## 2022-05-01 DIAGNOSIS — R2681 Unsteadiness on feet: Secondary | ICD-10-CM

## 2022-05-01 NOTE — Therapy (Signed)
OUTPATIENT PHYSICAL THERAPY TREATMENT NOTE   Patient Name: Debbie Velasquez MRN: [id number] DOB:Jul 18, 1951, 71 y.o., female Today's Date: 05/01/2022  END OF SESSION:   PT End of Session - 05/01/22 1435     Visit Number 6    Number of Visits 28    Date for PT Re-Evaluation 07/07/22    Authorization Type [employer]    PT Start Time 1430    PT Stop Time 1525    PT Time Calculation (min) 55 min    Activity Tolerance Patient tolerated treatment well;Patient limited by fatigue;Patient limited by pain    Behavior During Therapy WFL for tasks assessed/performed                 Past Medical History:  Diagnosis Date   Anemia    Arthritis    Cancer of left kidney ([place])    Left renal mass   GERD (gastroesophageal reflux disease)    History of kidney stones    x3 -remains with one on right.    Hx of seasonal allergies    rare flare ups   Hypertension    Left renal mass    Obesity    Pneumonia    hx of 20 years ago   Right ureteral stone    Type 2 diabetes mellitus ([place])    Type 2   Wears glasses    Wears partial dentures    upper   Past Surgical History:  Procedure Laterality Date   CESAREAN SECTION  yrs ago   CYSTOSCOPY WITH RETROGRADE PYELOGRAM, URETEROSCOPY AND STENT PLACEMENT Left 05/24/2018   Procedure: CYSTOSCOPY WITH RETROGRADE PYELOGRAM, URETEROSCOPY AND STENT PLACEMENT;  Surgeon: Alexis Frock, MD;  Location: [facility];  Service: Urology;  Laterality: Left;   CYSTOSCOPY WITH RETROGRADE PYELOGRAM, URETEROSCOPY AND STENT PLACEMENT Left 02/27/2020   Procedure: CYSTOSCOPY WITH RETROGRADE PYELOGRAM, URETEROSCOPY AND STENT PLACEMENT;  Surgeon: Alexis Frock, MD;  Location: [facility];  Service: Urology;  Laterality: Left;  1 HR   CYSTOSCOPY WITH RETROGRADE PYELOGRAM, URETEROSCOPY AND STENT PLACEMENT Bilateral 10/20/2020   Procedure: CYSTOSCOPY WITH RETROGRADE PYELOGRAM, URETEROSCOPY AND STENT PLACEMENT;  Surgeon: Alexis Frock, MD;  Location: [facility];  Service:  Urology;  Laterality: Bilateral;  75 MINS   HOLMIUM LASER APPLICATION Left 05/24/5052   Procedure: HOLMIUM LASER APPLICATION;  Surgeon: Alexis Frock, MD;  Location: [facility];  Service: Urology;  Laterality: Left;   HOLMIUM LASER APPLICATION Left 06/29/6733   Procedure: HOLMIUM LASER APPLICATION;  Surgeon: Alexis Frock, MD;  Location: [facility];  Service: Urology;  Laterality: Left;   HOLMIUM LASER APPLICATION Bilateral 19/37/9024   Procedure: HOLMIUM LASER APPLICATION;  Surgeon: Alexis Frock, MD;  Location: [facility];  Service: Urology;  Laterality: Bilateral;   ROBOTIC ASSITED PARTIAL NEPHRECTOMY Left 12/22/2015   Procedure: XI ROBOTIC ASSITED PARTIAL NEPHRECTOMY;  Surgeon: Alexis Frock, MD;  Location: [facility];  Service: Urology;  Laterality: Left;   TOTAL ABDOMINAL HYSTERECTOMY W/ BILATERAL SALPINGOOPHORECTOMY  1980's   TOTAL KNEE ARTHROPLASTY Left 01/12/2017   Procedure: LEFT TOTAL KNEE ARTHROPLASTY and Right knee steroid injection;  Surgeon: Mcarthur Rossetti, MD;  Location: [facility];  Service: Orthopedics;  Laterality: Left;   TOTAL KNEE ARTHROPLASTY Right 04/14/2022   Procedure: RIGHT TOTAL KNEE ARTHROPLASTY;  Surgeon: Mcarthur Rossetti, MD;  Location: [facility];  Service: Orthopedics;  Laterality: Right;   Patient Active Problem List   Diagnosis Date Noted   Status post total right knee replacement 04/14/2022   Chest pain 10/09/2020  Nausea 10/09/2020   Hypertension    Type 2 diabetes mellitus ([facility])    GERD (gastroesophageal reflux disease)    Facet degeneration of lumbar region 12/18/2018   Chronic low back pain without sciatica 06/18/2017   Primary osteoarthritis of right knee 03/26/2017   Presence of left artificial knee joint 02/26/2017   Unilateral primary osteoarthritis, left knee 01/12/2017   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/12/2017   Renal mass 12/22/2015    THERAPY DIAG:  Stiffness of right knee, not elsewhere classified  Muscle weakness  (generalized)  Localized edema  Acute pain of right knee  Other abnormalities of gait and mobility  Unsteadiness on feet  PCP: Nolene Ebbs, MD   REFERRING PROVIDER: Mcarthur Rossetti, MD   REFERRING DIAG: [phone] (ICD-10-CM) - Status post right knee replacement  Rationale for Evaluation and Treatment Rehabilitation   ONSET DATE: 04/14/2022 right TKR   SUBJECTIVE:    SUBJECTIVE STATEMENT: Her weekend went well.  She stiffens up quickly between exercise bouts but does report sitting longer than PT recommended.    PERTINENT HISTORY: OA, left TKR 2018, left kidney CA with partial nephrectomy 2017, HTN, obesity, DM2, LBP with sciatica RLE   PAIN:  NPRS scale: today 2-3/10 since last PT appt lowest 0/10 and highest 5/10 Pain location: Rt knee/lower leg Pain description: throbbing, stinging, burning Aggravating factors: WB activity, constant throughout day Relieving factors: meds, ice, elevation   PRECAUTIONS: None   WEIGHT BEARING RESTRICTIONS No   FALLS:  Has patient fallen in last 6 months? No   LIVING ENVIRONMENT: Lives with: lives with their spouse and lives with their daughter Lives in: House/apartment Stairs: Yes: External: 2 steps; on left going up Has following equipment at home: Single point cane, Walker - 2 wheeled, and built in Civil engineer, contracting   OCCUPATION: retired   PLOF: Independent, Independent with household mobility without device, and Independent with community mobility without device   PATIENT GOALS  walk without device, water aerobics,      OBJECTIVE:    DIAGNOSTIC FINDINGS: 04/14/22 Status post right knee arthroplasty with surgical sutures about the anterior aspect of the knee. Subcutaneous emphysema as expected. No perihardware fracture.   PATIENT SURVEYS:  04/19/2022 FOTO   43% with target 63%   COGNITION: 04/19/2022 Overall cognitive status: Within functional limits for tasks assessed                          SENSATION: 04/19/2022  Light touch: WFL   EDEMA:  04/19/2022 RLE:  above knee 61.3cm around knee  55cm  below knee 43.4cm LLE:  above knee 56.0 cm  around knee 45cm below knee  41cm   POSTURE: 6/28/203:  rounded shoulders, forward head, flexed trunk , and weight shift left   PALPATION: 6/28/203:  tenderness all areas of right knee, redness & warmth laterally and medially.    LOWER EXTREMITY ROM:   ROM P:passive  A:active Right 04/19/2022 Left 04/19/2022 Right 04/26/2022  Hip flexion       Hip extension       Hip abduction       Hip adduction       Hip internal rotation       Hip external rotation       Knee flexion Supine head propped up P: 47* Seated A: 42*   Supine AROM heel slide 68 deg  Knee extension Supine P: -8* A: -30* SAQ     Ankle dorsiflexion  Ankle plantarflexion       Ankle inversion       Ankle eversion        (Blank rows = not tested)   LOWER EXTREMITY MMT:   MMT Right 04/19/2022 Left 04/19/2022  Hip flexion      Hip extension      Hip abduction      Hip adduction      Hip internal rotation      Hip external rotation      Knee flexion 2-/5    Knee extension 2-/5    Ankle dorsiflexion      Ankle plantarflexion      Ankle inversion      Ankle eversion       (Blank rows = not tested)   BED MOBILITY:  04/19/2022  Patient requires total manual assist for RLE for sit to supine & supine to sit.     GAIT: 04/28/2022:  FWW ambulation continued in clinic  6/28/203:  Distance walked: 50' Assistive device utilized: Environmental consultant - 2 wheeled Level of assistance: SBA Comments: antalgic gait with decreased stance RLE, right knee flexed in stance with minimal to no increase flexion for swing.        TODAY'S TREATMENT: 05/01/2022 Therex:  SciFit bike seat 11 rocking back / forth 8 mins BUEs / BLEs  Leg press double leg 75 lbs 5 sec hold flex & ext 2 sets x 10 reps PT manual assist end range  Seated Rt knee LAQ and active knee flexion c end range 5 sec hold each directions 30 reps  with contralateral LE opposing motion.   Neuro re-ed:  Modified tandem stance (leg forward but not directly in line) 1 min x 1 bilateral Heel raises 3 sec hold & toe raises 3 sec hold without UE support 10 reps ea.   Manual therapy:  PROM with overpressure and contract-relax for flexion & extension  Vasopneumatic  Supine head elevated 10 mins 34 deg medium compression c leg in elevation  04/28/2022: Therex:  Nustep Lvl 5 9 mins UE/LE  Leg press double leg 50 lbs 2 x 10 c slow lowering focus Seated alternating isometric holds extension/flexion Rt knee in 70 deg 5 sec hold x 18 bilateral  Seated Rt knee LAQ c end range pause each directions 3 x 10 2 lbs  Seated Rt leg hamstring stretch c strap (foot on ground) 15 sec x 5  Supine heel slide c strap 10 sec x 8     Neuro re-ed:  Modified tandem stance (leg forward but not directly in line) 1 min x 1 bilateral Retro step Rt leg posterior x 10   Vasopneumatic  Supine head elevated 10 mins 34 deg medium compression c leg in elevation  04/27/2022: Therex:  Seated alternating isometric holds extension/flexion Rt knee in 70 deg 5 sec hold x 18 bilateral  Seated Rt knee LAQ c end range pause each directions 2 x 10 1.5 lbs  Seated Rt leg hamstring stretch c strap (foot on ground) 15 sec x 5  Seated quad set Rt 5 sec hold x 10   Sit to stand to sit no UE assist 23 inch table height 2 x 5  Standing runner stretch at wall 15 sec x 5 Rt leg posterior  Church pew anterior/posterior weight shift 2 mins c SBA  Manual:  Rt knee flexion/distraction/IR mobilization c movement for Rt knee ROM gains in sitting.  Contract/relax to Rt quad for flexion gains.   Vasopneumatic  Supine head elevated 10 mins 34 deg medium compression c leg in elevation      PATIENT EDUCATION:  Education details: Access Code: [place] educated: Patient Education method: Explanation, Demonstration, Tactile cues, Verbal cues, and Handouts Education comprehension:  verbalized understanding, returned demonstration, verbal cues required, tactile cues required, and needs further education     HOME EXERCISE PROGRAM: Access Code: [place] URL: [email or web address] Date: 04/19/2022 Prepared by: Jamey Reas   Exercises - Seated Knee Flexion Extension AROM   - 2-4 x daily - 7 x weekly - 2 sets - 10 reps - 5 seconds hold - Seated Hamstring Stretch with Strap  - 2-4 x daily - 7 x weekly - 1 sets - 2-3 reps - 30 seconds hold - Seated Quad Set  - 2-4 x daily - 7 x weekly - 2 sets - 10 reps - 5 seconds hold - Supine Short Arc Quad  - 2-4 x daily - 7 x weekly - 2 sets - 10 reps - 5 seconds hold - Supine Heel Slide with Strap  - 2-4 x daily - 7 x weekly - 2 sets - 10 reps - 5 seconds hold - Quad Setting and Stretching  - 2-4 x daily - 7 x weekly - 5-10 sets - 10 reps - prop 5-10 minutes & quad set5 seconds hold   ASSESSMENT:   CLINICAL IMPRESSION: Patient is increasing range with skilled PT progressing functional activities & exercises.  She is limited by pain but is able to tolerate progressive activities.  Pt continues to benefit from skilled PT.   OBJECTIVE IMPAIRMENTS Abnormal gait, decreased activity tolerance, decreased balance, decreased endurance, decreased knowledge of use of DME, decreased mobility, difficulty walking, decreased ROM, decreased strength, increased edema, impaired flexibility, postural dysfunction, obesity, and pain.    ACTIVITY LIMITATIONS carrying, lifting, bending, sitting, standing, squatting, sleeping, stairs, transfers, bed mobility, and locomotion level   PARTICIPATION LIMITATIONS: meal prep, cleaning, driving, and community activity   PERSONAL FACTORS Age, Fitness, Past/current experiences, and 3+ comorbidities: see PMH  are also affecting patient's functional outcome.    REHAB POTENTIAL: Good   CLINICAL DECISION MAKING: Stable/uncomplicated   EVALUATION COMPLEXITY: Low     GOALS: Goals reviewed with  patient? Yes   SHORT TERM GOALS: Target date: 05/19/2022   Patient independent and verbalizes compliance with initial HEP Baseline: SEE OBJECTIVE DATA Goal status: on going - 04/26/2022   2.  Patient reports 50% improvement in right knee pain. Baseline: SEE OBJECTIVE DATA Goal status: on going - 04/26/2022   3.  PROM right knee extension -3* to flexion 90* Baseline: SEE OBJECTIVE DATA Goal status: on going - 04/26/2022   LONG TERM GOALS: Target date: 07/07/2022   Patient will improve FOTO score to 63% Baseline: SEE OBJECTIVE DATA Goal status: INITIAL   2.  Patient reports right knee pain </= 2/10 with standing & gait activities.  Baseline: SEE OBJECTIVE DATA Goal status: INITIAL   3.  Right Knee PROM 0* extension to 100* flexion Baseline: SEE OBJECTIVE DATA Goal status: INITIAL   4.  Right Knee AROM seated -3* extension to 90* flexion Baseline: SEE OBJECTIVE DATA Goal status: INITIAL   5.  Patient ambulates >300' community distances including negotiating ramps, curbs & stairs with LRAD independently. Baseline: SEE OBJECTIVE DATA Goal status: INITIAL       PLAN: PT FREQUENCY: 2-3 x/wk   PT DURATION: 12 weeks   PLANNED INTERVENTIONS: Therapeutic exercises, Therapeutic activity, Neuromuscular re-education, Balance training, Gait training, Patient/Family education,  Joint mobilization, Stair training, DME instructions, Aquatic Therapy, Electrical stimulation, Cryotherapy, Moist heat, scar mobilization, Taping, Vasopneumatic device, Manual therapy, and physical performance testing   PLAN FOR NEXT SESSION:   Manual therapy  for mobility/pain improvements.  Progressive strengthening as tolerated c static balance interventions, vaso to end  Jamey Reas, PT, DPT 05/01/22  3:38 PM

## 2022-05-03 ENCOUNTER — Encounter: Payer: Self-pay | Admitting: Physical Therapy

## 2022-05-03 ENCOUNTER — Ambulatory Visit (INDEPENDENT_AMBULATORY_CARE_PROVIDER_SITE_OTHER): Payer: Medicare (Managed Care) | Admitting: Physical Therapy

## 2022-05-03 DIAGNOSIS — M6281 Muscle weakness (generalized): Secondary | ICD-10-CM

## 2022-05-03 DIAGNOSIS — M25661 Stiffness of right knee, not elsewhere classified: Secondary | ICD-10-CM

## 2022-05-03 DIAGNOSIS — R2689 Other abnormalities of gait and mobility: Secondary | ICD-10-CM

## 2022-05-03 DIAGNOSIS — R600 Localized edema: Secondary | ICD-10-CM

## 2022-05-03 DIAGNOSIS — M25561 Pain in right knee: Secondary | ICD-10-CM | POA: Diagnosis not present

## 2022-05-03 DIAGNOSIS — R2681 Unsteadiness on feet: Secondary | ICD-10-CM

## 2022-05-03 NOTE — Therapy (Signed)
OUTPATIENT PHYSICAL THERAPY TREATMENT NOTE   Patient Name: Debbie Velasquez MRN: [id number] DOB:11/22/1950, 71 y.o., female Today's Date: 05/03/2022  END OF SESSION:   PT End of Session - 05/03/22 1437     Visit Number 7    Number of Visits 28    Date for PT Re-Evaluation 07/07/22    Authorization Type [employer]    PT Start Time 1432    PT Stop Time 1528    PT Time Calculation (min) 56 min    Activity Tolerance Patient tolerated treatment well;Patient limited by fatigue;Patient limited by pain    Behavior During Therapy WFL for tasks assessed/performed                  Past Medical History:  Diagnosis Date   Anemia    Arthritis    Cancer of left kidney ([place])    Left renal mass   GERD (gastroesophageal reflux disease)    History of kidney stones    x3 -remains with one on right.    Hx of seasonal allergies    rare flare ups   Hypertension    Left renal mass    Obesity    Pneumonia    hx of 20 years ago   Right ureteral stone    Type 2 diabetes mellitus ([place])    Type 2   Wears glasses    Wears partial dentures    upper   Past Surgical History:  Procedure Laterality Date   CESAREAN SECTION  yrs ago   CYSTOSCOPY WITH RETROGRADE PYELOGRAM, URETEROSCOPY AND STENT PLACEMENT Left 05/24/2018   Procedure: CYSTOSCOPY WITH RETROGRADE PYELOGRAM, URETEROSCOPY AND STENT PLACEMENT;  Surgeon: Alexis Frock, MD;  Location: [facility];  Service: Urology;  Laterality: Left;   CYSTOSCOPY WITH RETROGRADE PYELOGRAM, URETEROSCOPY AND STENT PLACEMENT Left 02/27/2020   Procedure: CYSTOSCOPY WITH RETROGRADE PYELOGRAM, URETEROSCOPY AND STENT PLACEMENT;  Surgeon: Alexis Frock, MD;  Location: [facility];  Service: Urology;  Laterality: Left;  1 HR   CYSTOSCOPY WITH RETROGRADE PYELOGRAM, URETEROSCOPY AND STENT PLACEMENT Bilateral 10/20/2020   Procedure: CYSTOSCOPY WITH RETROGRADE PYELOGRAM, URETEROSCOPY AND STENT PLACEMENT;  Surgeon: Alexis Frock, MD;  Location: [facility];  Service:  Urology;  Laterality: Bilateral;  75 MINS   HOLMIUM LASER APPLICATION Left 01/27/4258   Procedure: HOLMIUM LASER APPLICATION;  Surgeon: Alexis Frock, MD;  Location: [facility];  Service: Urology;  Laterality: Left;   HOLMIUM LASER APPLICATION Left 02/25/3874   Procedure: HOLMIUM LASER APPLICATION;  Surgeon: Alexis Frock, MD;  Location: [facility];  Service: Urology;  Laterality: Left;   HOLMIUM LASER APPLICATION Bilateral 64/33/2951   Procedure: HOLMIUM LASER APPLICATION;  Surgeon: Alexis Frock, MD;  Location: [facility];  Service: Urology;  Laterality: Bilateral;   ROBOTIC ASSITED PARTIAL NEPHRECTOMY Left 12/22/2015   Procedure: XI ROBOTIC ASSITED PARTIAL NEPHRECTOMY;  Surgeon: Alexis Frock, MD;  Location: [facility];  Service: Urology;  Laterality: Left;   TOTAL ABDOMINAL HYSTERECTOMY W/ BILATERAL SALPINGOOPHORECTOMY  1980's   TOTAL KNEE ARTHROPLASTY Left 01/12/2017   Procedure: LEFT TOTAL KNEE ARTHROPLASTY and Right knee steroid injection;  Surgeon: Mcarthur Rossetti, MD;  Location: [facility];  Service: Orthopedics;  Laterality: Left;   TOTAL KNEE ARTHROPLASTY Right 04/14/2022   Procedure: RIGHT TOTAL KNEE ARTHROPLASTY;  Surgeon: Mcarthur Rossetti, MD;  Location: [facility];  Service: Orthopedics;  Laterality: Right;   Patient Active Problem List   Diagnosis Date Noted   Status post total right knee replacement 04/14/2022   Chest pain 10/09/2020  Nausea 10/09/2020   Hypertension    Type 2 diabetes mellitus ([facility])    GERD (gastroesophageal reflux disease)    Facet degeneration of lumbar region 12/18/2018   Chronic low back pain without sciatica 06/18/2017   Primary osteoarthritis of right knee 03/26/2017   Presence of left artificial knee joint 02/26/2017   Unilateral primary osteoarthritis, left knee 01/12/2017   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/12/2017   Renal mass 12/22/2015    THERAPY DIAG:  Stiffness of right knee, not elsewhere classified  Muscle weakness  (generalized)  Localized edema  Acute pain of right knee  Other abnormalities of gait and mobility  Unsteadiness on feet  PCP: Nolene Ebbs, MD   REFERRING PROVIDER: Mcarthur Rossetti, MD   REFERRING DIAG: [id number] (ICD-10-CM) - Status post right knee replacement  Rationale for Evaluation and Treatment Rehabilitation   ONSET DATE: 04/14/2022 right TKR   SUBJECTIVE:    SUBJECTIVE STATEMENT: She reports doing something yesterday that caused 6/10 knee pain, leg tightness, and swelling in her foot. She iced in the afternoon that relieved some pain and swelling.   PERTINENT HISTORY: OA, left TKR 2018, left kidney CA with partial nephrectomy 2017, HTN, obesity, DM2, LBP with sciatica RLE   PAIN:  NPRS scale: 0/10 since last PT appt highest 6/10 Pain location: Rt knee/lower leg Pain description: throbbing, stinging, burning Aggravating factors: WB activity, constant throughout day Relieving factors: meds, ice, elevation   PRECAUTIONS: None   WEIGHT BEARING RESTRICTIONS No   FALLS:  Has patient fallen in last 6 months? No   LIVING ENVIRONMENT: Lives with: lives with their spouse and lives with their daughter Lives in: House/apartment Stairs: Yes: External: 2 steps; on left going up Has following equipment at home: Single point cane, Walker - 2 wheeled, and built in Civil engineer, contracting   OCCUPATION: retired   PLOF: Independent, Independent with household mobility without device, and Independent with community mobility without device   PATIENT GOALS  walk without device, water aerobics,      OBJECTIVE:    DIAGNOSTIC FINDINGS: 04/14/22 Status post right knee arthroplasty with surgical sutures about the anterior aspect of the knee. Subcutaneous emphysema as expected. No perihardware fracture.   PATIENT SURVEYS:  04/19/2022 FOTO   43% with target 63%   COGNITION: 04/19/2022 Overall cognitive status: Within functional limits for tasks assessed                           SENSATION: 04/19/2022 Light touch: WFL   EDEMA:  04/19/2022 RLE:  above knee 61.3cm around knee  55cm  below knee 43.4cm LLE:  above knee 56.0 cm  around knee 45cm below knee  41cm   POSTURE: 6/28/203:  rounded shoulders, forward head, flexed trunk , and weight shift left   PALPATION: 6/28/203:  tenderness all areas of right knee, redness & warmth laterally and medially.    LOWER EXTREMITY ROM:   ROM P:passive  A:active Right 04/19/2022 Left 04/19/2022 Right 04/26/2022 Right  05/03/2022  Hip flexion        Hip extension        Hip abduction        Hip adduction        Hip internal rotation        Hip external rotation        Knee flexion Supine head propped up P: 47* Seated A: 42*   Supine AROM heel slide 68 deg Seated P: 77*  A: 70*  Knee extension Supine P: -8* A: -30* SAQ    Seated LAQ A: -19*  Ankle dorsiflexion        Ankle plantarflexion        Ankle inversion        Ankle eversion         (Blank rows = not tested)   LOWER EXTREMITY MMT:   MMT Right 04/19/2022 Left 04/19/2022  Hip flexion      Hip extension      Hip abduction      Hip adduction      Hip internal rotation      Hip external rotation      Knee flexion 2-/5    Knee extension 2-/5    Ankle dorsiflexion      Ankle plantarflexion      Ankle inversion      Ankle eversion       (Blank rows = not tested)   BED MOBILITY:  04/19/2022  Patient requires total manual assist for RLE for sit to supine & supine to sit.     GAIT: 04/28/2022:  FWW ambulation continued in clinic  6/28/203:  Distance walked: 50' Assistive device utilized: Environmental consultant - 2 wheeled Level of assistance: SBA Comments: antalgic gait with decreased stance RLE, right knee flexed in stance with minimal to no increase flexion for swing.      TODAY'S TREATMENT: 05/03/2022 Therex:  Leg press BLE 75 lbs 5 sec hold flex & ext 2 sets x 10 reps, RLE 31 lbs x 10 reps  SciFit bike seat 11 rocking back / forth 8 mins BUEs / BLEs  Seated Rt  knee LAQ and active knee flexion c end range 5 sec hold each directions x30 reps with contralateral LE opposing motion, 2lb ankle weight RLE   Neuro re-ed:  Modified tandem stance on foam beam (leg forward but not directly in line) 30 sec x 1 bilateral, multiple losses of balance each direction to grab sink or walker RLE single leg stance with anterior 3 cone tapping x 3 reps BUE on RW, x3 reps LUE support, x3 reps RUE support  Manual therapy:  PROM with overpressure and contract-relax for knee flexion  Vasopneumatic  Supine head elevated 10 mins 34 deg medium compression c leg in elevation  05/01/2022 Therex:  SciFit bike seat 11 rocking back / forth 8 mins BUEs / BLEs  Leg press double leg 75 lbs 5 sec hold flex & ext 2 sets x 10 reps PT manual assist end range  Seated Rt knee LAQ and active knee flexion c end range 5 sec hold each directions 30 reps with contralateral LE opposing motion.   Neuro re-ed:  Modified tandem stance (leg forward but not directly in line) 1 min x 1 bilateral Heel raises 3 sec hold & toe raises 3 sec hold without UE support 10 reps ea.   Manual therapy:  PROM with overpressure and contract-relax for flexion & extension  Vasopneumatic  Supine head elevated 10 mins 34 deg medium compression c leg in elevation  04/28/2022: Therex:  Nustep Lvl 5 9 mins UE/LE  Leg press double leg 50 lbs 2 x 10 c slow lowering focus Seated alternating isometric holds extension/flexion Rt knee in 70 deg 5 sec hold x 18 bilateral  Seated Rt knee LAQ c end range pause each directions 3 x 10 2 lbs  Seated Rt leg hamstring stretch c strap (foot on ground) 15 sec x 5  Supine heel  slide c strap 10 sec x 8     Neuro re-ed:  Modified tandem stance (leg forward but not directly in line) 1 min x 1 bilateral Retro step Rt leg posterior x 10   Vasopneumatic  Supine head elevated 10 mins 34 deg medium compression c leg in elevation   PATIENT EDUCATION:  Education details: Access  Code: [place] Person educated: Patient Education method: Explanation, Demonstration, Tactile cues, Verbal cues, and Handouts Education comprehension: verbalized understanding, returned demonstration, verbal cues required, tactile cues required, and needs further education     HOME EXERCISE PROGRAM: Access Code: [place] URL: [email or web address] Date: 04/19/2022 Prepared by: Jamey Reas   Exercises - Seated Knee Flexion Extension AROM   - 2-4 x daily - 7 x weekly - 2 sets - 10 reps - 5 seconds hold - Seated Hamstring Stretch with Strap  - 2-4 x daily - 7 x weekly - 1 sets - 2-3 reps - 30 seconds hold - Seated Quad Set  - 2-4 x daily - 7 x weekly - 2 sets - 10 reps - 5 seconds hold - Supine Short Arc Quad  - 2-4 x daily - 7 x weekly - 2 sets - 10 reps - 5 seconds hold - Supine Heel Slide with Strap  - 2-4 x daily - 7 x weekly - 2 sets - 10 reps - 5 seconds hold - Quad Setting and Stretching  - 2-4 x daily - 7 x weekly - 5-10 sets - 10 reps - prop 5-10 minutes & quad set5 seconds hold   ASSESSMENT:   CLINICAL IMPRESSION: Patient is limited by pain for range and functional strengthening, but continues to tolerate progression of activities. Her knee flexion and extension ROM values have both improved. Pt continues to benefit from skilled PT.  OBJECTIVE IMPAIRMENTS Abnormal gait, decreased activity tolerance, decreased balance, decreased endurance, decreased knowledge of use of DME, decreased mobility, difficulty walking, decreased ROM, decreased strength, increased edema, impaired flexibility, postural dysfunction, obesity, and pain.    ACTIVITY LIMITATIONS carrying, lifting, bending, sitting, standing, squatting, sleeping, stairs, transfers, bed mobility, and locomotion level   PARTICIPATION LIMITATIONS: meal prep, cleaning, driving, and community activity   PERSONAL FACTORS Age, Fitness, Past/current experiences, and 3+ comorbidities: see PMH  are also affecting  patient's functional outcome.    REHAB POTENTIAL: Good   CLINICAL DECISION MAKING: Stable/uncomplicated   EVALUATION COMPLEXITY: Low     GOALS: Goals reviewed with patient? Yes   SHORT TERM GOALS: Target date: 05/19/2022   Patient independent and verbalizes compliance with initial HEP Baseline: SEE OBJECTIVE DATA Goal status: on going - 04/26/2022   2.  Patient reports 50% improvement in right knee pain. Baseline: SEE OBJECTIVE DATA Goal status: on going - 04/26/2022   3.  PROM right knee extension -3* to flexion 90* Baseline: SEE OBJECTIVE DATA Goal status: on going - 04/26/2022   LONG TERM GOALS: Target date: 07/07/2022   Patient will improve FOTO score to 63% Baseline: SEE OBJECTIVE DATA Goal status: INITIAL   2.  Patient reports right knee pain </= 2/10 with standing & gait activities.  Baseline: SEE OBJECTIVE DATA Goal status: INITIAL   3.  Right Knee PROM 0* extension to 100* flexion Baseline: SEE OBJECTIVE DATA Goal status: INITIAL   4.  Right Knee AROM seated -3* extension to 90* flexion Baseline: SEE OBJECTIVE DATA Goal status: INITIAL   5.  Patient ambulates >300' community distances including negotiating ramps, curbs & stairs with LRAD independently. Baseline: SEE OBJECTIVE  DATA Goal status: INITIAL       PLAN: PT FREQUENCY: 2-3 x/wk   PT DURATION: 12 weeks   PLANNED INTERVENTIONS: Therapeutic exercises, Therapeutic activity, Neuromuscular re-education, Balance training, Gait training, Patient/Family education, Joint mobilization, Stair training, DME instructions, Aquatic Therapy, Electrical stimulation, Cryotherapy, Moist heat, scar mobilization, Taping, Vasopneumatic device, Manual therapy, and physical performance testing   PLAN FOR NEXT SESSION:   continue Manual therapy for flexion & ext.  Progressive strengthening as tolerated c static balance interventions, vaso to end  [employer], SPT 05/03/2022, 3:51 PM   This entire session of physical  therapy was performed under the direct supervision of PT signing evaluation /treatment. PT reviewed note and agrees.  Jamey Reas, PT, DPT 05/03/22  4:55 PM

## 2022-05-05 ENCOUNTER — Encounter: Payer: Self-pay | Admitting: Rehabilitative and Restorative Service Providers"

## 2022-05-05 ENCOUNTER — Encounter (HOSPITAL_BASED_OUTPATIENT_CLINIC_OR_DEPARTMENT_OTHER): Payer: Medicare (Managed Care) | Admitting: Rehabilitative and Restorative Service Providers"

## 2022-05-05 ENCOUNTER — Ambulatory Visit (INDEPENDENT_AMBULATORY_CARE_PROVIDER_SITE_OTHER): Payer: Medicare (Managed Care) | Admitting: Rehabilitative and Restorative Service Providers"

## 2022-05-05 DIAGNOSIS — R2689 Other abnormalities of gait and mobility: Secondary | ICD-10-CM

## 2022-05-05 DIAGNOSIS — M25561 Pain in right knee: Secondary | ICD-10-CM | POA: Diagnosis not present

## 2022-05-05 DIAGNOSIS — R600 Localized edema: Secondary | ICD-10-CM | POA: Diagnosis not present

## 2022-05-05 DIAGNOSIS — R2681 Unsteadiness on feet: Secondary | ICD-10-CM

## 2022-05-05 DIAGNOSIS — M6281 Muscle weakness (generalized): Secondary | ICD-10-CM

## 2022-05-05 DIAGNOSIS — M25661 Stiffness of right knee, not elsewhere classified: Secondary | ICD-10-CM

## 2022-05-05 NOTE — Therapy (Signed)
OUTPATIENT PHYSICAL THERAPY TREATMENT NOTE   Patient Name: Debbie Velasquez MRN: [id number] DOB:1950/10/31, 71 y.o., female Today's Date: 05/03/2022  END OF SESSION:   PT End of Session - 05/03/22 1437     Visit Number 8   Number of Visits 28    Date for PT Re-Evaluation 07/07/22    Authorization Type [employer]    PT Start Time 1104   PT Stop Time 1203   PT Time Calculation (min) 56 min    Activity Tolerance Patient tolerated treatment well;Patient limited by fatigue;Patient limited by pain    Behavior During Therapy WFL for tasks assessed/performed                 Past Medical History:  Diagnosis Date   Anemia    Arthritis    Cancer of left kidney ([place])    Left renal mass   GERD (gastroesophageal reflux disease)    History of kidney stones    x3 -remains with one on right.    Hx of seasonal allergies    rare flare ups   Hypertension    Left renal mass    Obesity    Pneumonia    hx of 20 years ago   Right ureteral stone    Type 2 diabetes mellitus ([place])    Type 2   Wears glasses    Wears partial dentures    upper   Past Surgical History:  Procedure Laterality Date   CESAREAN SECTION  yrs ago   CYSTOSCOPY WITH RETROGRADE PYELOGRAM, URETEROSCOPY AND STENT PLACEMENT Left 05/24/2018   Procedure: CYSTOSCOPY WITH RETROGRADE PYELOGRAM, URETEROSCOPY AND STENT PLACEMENT;  Surgeon: Alexis Frock, MD;  Location: [facility];  Service: Urology;  Laterality: Left;   CYSTOSCOPY WITH RETROGRADE PYELOGRAM, URETEROSCOPY AND STENT PLACEMENT Left 02/27/2020   Procedure: CYSTOSCOPY WITH RETROGRADE PYELOGRAM, URETEROSCOPY AND STENT PLACEMENT;  Surgeon: Alexis Frock, MD;  Location: [facility];  Service: Urology;  Laterality: Left;  1 HR   CYSTOSCOPY WITH RETROGRADE PYELOGRAM, URETEROSCOPY AND STENT PLACEMENT Bilateral 10/20/2020   Procedure: CYSTOSCOPY WITH RETROGRADE PYELOGRAM, URETEROSCOPY AND STENT PLACEMENT;  Surgeon: Alexis Frock, MD;  Location: [facility];  Service:  Urology;  Laterality: Bilateral;  75 MINS   HOLMIUM LASER APPLICATION Left 0/04/3709   Procedure: HOLMIUM LASER APPLICATION;  Surgeon: Alexis Frock, MD;  Location: [facility];  Service: Urology;  Laterality: Left;   HOLMIUM LASER APPLICATION Left 03/25/6947   Procedure: HOLMIUM LASER APPLICATION;  Surgeon: Alexis Frock, MD;  Location: [facility];  Service: Urology;  Laterality: Left;   HOLMIUM LASER APPLICATION Bilateral 54/62/7035   Procedure: HOLMIUM LASER APPLICATION;  Surgeon: Alexis Frock, MD;  Location: [facility];  Service: Urology;  Laterality: Bilateral;   ROBOTIC ASSITED PARTIAL NEPHRECTOMY Left 12/22/2015   Procedure: XI ROBOTIC ASSITED PARTIAL NEPHRECTOMY;  Surgeon: Alexis Frock, MD;  Location: [facility];  Service: Urology;  Laterality: Left;   TOTAL ABDOMINAL HYSTERECTOMY W/ BILATERAL SALPINGOOPHORECTOMY  1980's   TOTAL KNEE ARTHROPLASTY Left 01/12/2017   Procedure: LEFT TOTAL KNEE ARTHROPLASTY and Right knee steroid injection;  Surgeon: Mcarthur Rossetti, MD;  Location: [facility];  Service: Orthopedics;  Laterality: Left;   TOTAL KNEE ARTHROPLASTY Right 04/14/2022   Procedure: RIGHT TOTAL KNEE ARTHROPLASTY;  Surgeon: Mcarthur Rossetti, MD;  Location: [facility];  Service: Orthopedics;  Laterality: Right;   Patient Active Problem List   Diagnosis Date Noted   Status post total right knee replacement 04/14/2022   Chest pain 10/09/2020   Nausea 10/09/2020  Hypertension    Type 2 diabetes mellitus ([facility])    GERD (gastroesophageal reflux disease)    Facet degeneration of lumbar region 12/18/2018   Chronic low back pain without sciatica 06/18/2017   Primary osteoarthritis of right knee 03/26/2017   Presence of left artificial knee joint 02/26/2017   Unilateral primary osteoarthritis, left knee 01/12/2017   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/12/2017   Renal mass 12/22/2015    THERAPY DIAG:  Stiffness of right knee, not elsewhere classified  Muscle weakness  (generalized)  Localized edema  Acute pain of right knee  Other abnormalities of gait and mobility  Unsteadiness on feet  PCP: Nolene Ebbs, MD   REFERRING PROVIDER: Mcarthur Rossetti, MD   REFERRING DIAG: [phone] (ICD-10-CM) - Status post right knee replacement  Rationale for Evaluation and Treatment Rehabilitation   ONSET DATE: 04/14/2022 right TKR   SUBJECTIVE:    SUBJECTIVE STATEMENT: She reports higher swelling in foot and posterior knee since yesterday evening and used ice and elevation to reduce it some. She had higher pain in the evening after the last PT appointment once the medication wore off. She has some discomfort today but no pain.   PERTINENT HISTORY: OA, left TKR 2018, left kidney CA with partial nephrectomy 2017, HTN, obesity, DM2, LBP with sciatica RLE   PAIN:  NPRS scale: 0/10, since last PT appt highest 5-6/10 Pain location: Rt knee/lower leg Pain description: throbbing, stinging, burning Aggravating factors: WB activity, constant throughout day Relieving factors: meds, ice, elevation   PRECAUTIONS: None   WEIGHT BEARING RESTRICTIONS No   FALLS:  Has patient fallen in last 6 months? No   LIVING ENVIRONMENT: Lives with: lives with their spouse and lives with their daughter Lives in: House/apartment Stairs: Yes: External: 2 steps; on left going up Has following equipment at home: Single point cane, Walker - 2 wheeled, and built in Civil engineer, contracting   OCCUPATION: retired   PLOF: Independent, Independent with household mobility without device, and Independent with community mobility without device   PATIENT GOALS  walk without device, water aerobics,      OBJECTIVE:    DIAGNOSTIC FINDINGS:  04/14/22 Status post right knee arthroplasty with surgical sutures about the anterior aspect of the knee. Subcutaneous emphysema as expected. No perihardware fracture.   PATIENT SURVEYS:  04/19/2022 FOTO   43% with target 63%   COGNITION: 04/19/2022  Overall cognitive status: Within functional limits for tasks assessed                          SENSATION: 04/19/2022 Light touch: WFL   EDEMA:  04/19/2022 RLE:  above knee 61.3cm around knee  55cm  below knee 43.4cm LLE:  above knee 56.0 cm  around knee 45cm below knee  41cm   POSTURE: 6/28/203:  rounded shoulders, forward head, flexed trunk , and weight shift left   PALPATION: 6/28/203:  tenderness all areas of right knee, redness & warmth laterally and medially.    LOWER EXTREMITY ROM:   ROM P:passive  A:active Right 04/19/2022 Left 04/19/2022 Right 04/26/2022 Right  05/03/2022  Hip flexion        Hip extension        Hip abduction        Hip adduction        Hip internal rotation        Hip external rotation        Knee flexion Supine head propped up P:  47* Seated A: 42*   Supine AROM heel slide 68 deg Seated P: 77* A: 70*  Knee extension Supine P: -8* A: -30* SAQ    Seated LAQ A: -19*  Ankle dorsiflexion        Ankle plantarflexion        Ankle inversion        Ankle eversion         (Blank rows = not tested)   LOWER EXTREMITY MMT:   MMT Right 04/19/2022 Left 04/19/2022  Hip flexion      Hip extension      Hip abduction      Hip adduction      Hip internal rotation      Hip external rotation      Knee flexion 2-/5    Knee extension 2-/5    Ankle dorsiflexion      Ankle plantarflexion      Ankle inversion      Ankle eversion       (Blank rows = not tested)   BED MOBILITY:  04/19/2022  Patient requires total manual assist for RLE for sit to supine & supine to sit.     GAIT: 05/05/2022:  Household distance SPC use in Lt UE within clinic c SBA to supervision.  < 150 ft  04/28/2022:  FWW ambulation continued in clinic  6/28/203:  Distance walked: 50' Assistive device utilized: Environmental consultant - 2 wheeled Level of assistance: SBA Comments: antalgic gait with decreased stance RLE, right knee flexed in stance with minimal to no increase flexion for swing.       TODAY'S TREATMENT: 05/05/2022 Therex:  Nustep Lvl 5, 8 mins UE/LE  TherActivity:  Sit to /from stand with slow lowering and seat tap c bed at mid-thigh height x 8 reps, verbal cueing for foot placement and occasional UE support on cane and bed for stability  Pt ambulated 50 ft with single point cane in LUE safely with no knee instability or losses of balance, PT recommended to use it in the household with verbalized understanding from pt   Neuro re-ed:  Single leg stance with 3 cone tapping (anteromedial, anterior, lateral) 1 x 5 reps each leg RUE support on RW, 1 x 5 reps each leg LUE support Alternating posterior leg kicks 1 x 15 with BUE support on RW, verbal cueing for glute activation with hip forward and upright posture Modified tandem stance on ground (leg forward but not directly in line) eyes closed 1 x 30 sec each leg, no losses of balance  Manual therapy:  PROM with manual pressure and tibial IR/distraction for knee flexion  Vasopneumatic  Supine head elevated 10 mins 34 deg medium compression c R leg in elevation  05/03/2022 Therex:  Leg press BLE 75 lbs 5 sec hold flex & ext 2 sets x 10 reps, RLE 31 lbs x 10 reps  SciFit bike seat 11 rocking back / forth 8 mins BUEs / BLEs  Seated Rt knee LAQ and active knee flexion c end range 5 sec hold each directions x30 reps with contralateral LE opposing motion, 2lb ankle weight RLE   Neuro re-ed:  Modified tandem stance on foam beam (leg forward but not directly in line) 30 sec x 1 bilateral, multiple losses of balance each direction to grab sink or walker RLE single leg stance with anterior 3 cone tapping x 3 reps BUE on RW, x3 reps LUE support, x3 reps RUE support  Manual therapy:  PROM with overpressure and  contract-relax for knee flexion  Vasopneumatic  Supine head elevated 10 mins 34 deg medium compression c leg in elevation  05/01/2022 Therex:  SciFit bike seat 11 rocking back / forth 8 mins BUEs / BLEs  Leg press double  leg 75 lbs 5 sec hold flex & ext 2 sets x 10 reps PT manual assist end range  Seated Rt knee LAQ and active knee flexion c end range 5 sec hold each directions 30 reps with contralateral LE opposing motion.   Neuro re-ed:  Modified tandem stance (leg forward but not directly in line) 1 min x 1 bilateral Heel raises 3 sec hold & toe raises 3 sec hold without UE support 10 reps ea.   Manual therapy:  PROM with overpressure and contract-relax for flexion & extension  Vasopneumatic  Supine head elevated 10 mins 34 deg medium compression c leg in elevation    PATIENT EDUCATION:  Education details: Access Code: [place] educated: Patient Education method: Explanation, Demonstration, Tactile cues, Verbal cues, and Handouts Education comprehension: verbalized understanding, returned demonstration, verbal cues required, tactile cues required, and needs further education     HOME EXERCISE PROGRAM: Access Code: [place] URL: [email or web address] Date: 04/19/2022 Prepared by: Jamey Reas   Exercises - Seated Knee Flexion Extension AROM   - 2-4 x daily - 7 x weekly - 2 sets - 10 reps - 5 seconds hold - Seated Hamstring Stretch with Strap  - 2-4 x daily - 7 x weekly - 1 sets - 2-3 reps - 30 seconds hold - Seated Quad Set  - 2-4 x daily - 7 x weekly - 2 sets - 10 reps - 5 seconds hold - Supine Short Arc Quad  - 2-4 x daily - 7 x weekly - 2 sets - 10 reps - 5 seconds hold - Supine Heel Slide with Strap  - 2-4 x daily - 7 x weekly - 2 sets - 10 reps - 5 seconds hold - Quad Setting and Stretching  - 2-4 x daily - 7 x weekly - 5-10 sets - 10 reps - prop 5-10 minutes & quad set5 seconds hold   ASSESSMENT:   CLINICAL IMPRESSION: She is improving in static balance tasks and appears to be improving knee flexion ROM with manual techniques. It was recommended to use a cane in the LUE in the household instead of furniture walking, and she appears safe and stable to do so. She  remains limited by pain, stiffness, and swelling for range of motion and functional strengthening, including sitting to/from a standard chair height. She continues to benefit from skilled PT.  OBJECTIVE IMPAIRMENTS Abnormal gait, decreased activity tolerance, decreased balance, decreased endurance, decreased knowledge of use of DME, decreased mobility, difficulty walking, decreased ROM, decreased strength, increased edema, impaired flexibility, postural dysfunction, obesity, and pain.    ACTIVITY LIMITATIONS carrying, lifting, bending, sitting, standing, squatting, sleeping, stairs, transfers, bed mobility, and locomotion level   PARTICIPATION LIMITATIONS: meal prep, cleaning, driving, and community activity   PERSONAL FACTORS Age, Fitness, Past/current experiences, and 3+ comorbidities: see PMH  are also affecting patient's functional outcome.    REHAB POTENTIAL: Good   CLINICAL DECISION MAKING: Stable/uncomplicated   EVALUATION COMPLEXITY: Low     GOALS: Goals reviewed with patient? Yes   SHORT TERM GOALS: Target date: 05/19/2022   Patient independent and verbalizes compliance with initial HEP Baseline: SEE OBJECTIVE DATA Goal status: on going - 04/26/2022   2.  Patient reports 50% improvement in right knee pain. Baseline: SEE  OBJECTIVE DATA Goal status: on going - 04/26/2022   3.  PROM right knee extension -3* to flexion 90* Baseline: SEE OBJECTIVE DATA Goal status: on going - 04/26/2022   LONG TERM GOALS: Target date: 07/07/2022   Patient will improve FOTO score to 63% Baseline: SEE OBJECTIVE DATA Goal status: INITIAL   2.  Patient reports right knee pain </= 2/10 with standing & gait activities.  Baseline: SEE OBJECTIVE DATA Goal status: INITIAL   3.  Right Knee PROM 0* extension to 100* flexion Baseline: SEE OBJECTIVE DATA Goal status: INITIAL   4.  Right Knee AROM seated -3* extension to 90* flexion Baseline: SEE OBJECTIVE DATA Goal status: INITIAL   5.  Patient  ambulates >300' community distances including negotiating ramps, curbs & stairs with LRAD independently. Baseline: SEE OBJECTIVE DATA Goal status: INITIAL       PLAN: PT FREQUENCY: 2-3 x/wk   PT DURATION: 12 weeks   PLANNED INTERVENTIONS: Therapeutic exercises, Therapeutic activity, Neuromuscular re-education, Balance training, Gait training, Patient/Family education, Joint mobilization, Stair training, DME instructions, Aquatic Therapy, Electrical stimulation, Cryotherapy, Moist heat, scar mobilization, Taping, Vasopneumatic device, Manual therapy, and physical performance testing   PLAN FOR NEXT SESSION:  continue Manual therapy for flexion & ext.  Progressive strengthening with mix of weight bearing and non weight bearing, static balance interventions, vaso to end  [employer], SPT 05/05/2022, 12:20 PM   This entire session of physical therapy was performed under the direct supervision of PT signing evaluation /treatment. PT reviewed note and agrees.  Scot Jun, PT, DPT, OCS, ATC 05/05/22  1:19 PM

## 2022-05-08 ENCOUNTER — Ambulatory Visit (INDEPENDENT_AMBULATORY_CARE_PROVIDER_SITE_OTHER): Payer: Medicare (Managed Care) | Admitting: Physical Therapy

## 2022-05-08 ENCOUNTER — Encounter: Payer: Self-pay | Admitting: Physical Therapy

## 2022-05-08 DIAGNOSIS — R2681 Unsteadiness on feet: Secondary | ICD-10-CM

## 2022-05-08 DIAGNOSIS — M25661 Stiffness of right knee, not elsewhere classified: Secondary | ICD-10-CM

## 2022-05-08 DIAGNOSIS — M25561 Pain in right knee: Secondary | ICD-10-CM

## 2022-05-08 DIAGNOSIS — R600 Localized edema: Secondary | ICD-10-CM

## 2022-05-08 DIAGNOSIS — M6281 Muscle weakness (generalized): Secondary | ICD-10-CM | POA: Diagnosis not present

## 2022-05-08 DIAGNOSIS — R2689 Other abnormalities of gait and mobility: Secondary | ICD-10-CM

## 2022-05-08 NOTE — Therapy (Signed)
OUTPATIENT PHYSICAL THERAPY TREATMENT NOTE   Patient Name: Debbie Velasquez MRN: [id number] DOB:Nov 20, 1950, 71 y.o., female Today's Date: 05/08/2022  PCP: Nolene Ebbs, MD REFERRING PROVIDER: Mcarthur Rossetti, MD  END OF SESSION:   PT End of Session - 05/08/22 1429     Visit Number 9    Number of Visits 28    Date for PT Re-Evaluation 07/07/22    Authorization Type [employer]    Progress Note Due on Visit 10    PT Start Time 1430    PT Stop Time 1537    PT Time Calculation (min) 67 min    Activity Tolerance Patient tolerated treatment well;Patient limited by fatigue;Patient limited by pain    Behavior During Therapy WFL for tasks assessed/performed             Past Medical History:  Diagnosis Date   Anemia    Arthritis    Cancer of left kidney ([place])    Left renal mass   GERD (gastroesophageal reflux disease)    History of kidney stones    x3 -remains with one on right.    Hx of seasonal allergies    rare flare ups   Hypertension    Left renal mass    Obesity    Pneumonia    hx of 20 years ago   Right ureteral stone    Type 2 diabetes mellitus ([place])    Type 2   Wears glasses    Wears partial dentures    upper   Past Surgical History:  Procedure Laterality Date   CESAREAN SECTION  yrs ago   CYSTOSCOPY WITH RETROGRADE PYELOGRAM, URETEROSCOPY AND STENT PLACEMENT Left 05/24/2018   Procedure: CYSTOSCOPY WITH RETROGRADE PYELOGRAM, URETEROSCOPY AND STENT PLACEMENT;  Surgeon: Alexis Frock, MD;  Location: [facility];  Service: Urology;  Laterality: Left;   CYSTOSCOPY WITH RETROGRADE PYELOGRAM, URETEROSCOPY AND STENT PLACEMENT Left 02/27/2020   Procedure: CYSTOSCOPY WITH RETROGRADE PYELOGRAM, URETEROSCOPY AND STENT PLACEMENT;  Surgeon: Alexis Frock, MD;  Location: [facility];  Service: Urology;  Laterality: Left;  1 HR   CYSTOSCOPY WITH RETROGRADE PYELOGRAM, URETEROSCOPY AND STENT PLACEMENT Bilateral 10/20/2020   Procedure: CYSTOSCOPY WITH RETROGRADE  PYELOGRAM, URETEROSCOPY AND STENT PLACEMENT;  Surgeon: Alexis Frock, MD;  Location: [facility];  Service: Urology;  Laterality: Bilateral;  75 MINS   HOLMIUM LASER APPLICATION Left 4/0/9735   Procedure: HOLMIUM LASER APPLICATION;  Surgeon: Alexis Frock, MD;  Location: [facility];  Service: Urology;  Laterality: Left;   HOLMIUM LASER APPLICATION Left 12/23/9922   Procedure: HOLMIUM LASER APPLICATION;  Surgeon: Alexis Frock, MD;  Location: [facility];  Service: Urology;  Laterality: Left;   HOLMIUM LASER APPLICATION Bilateral 26/83/4196   Procedure: HOLMIUM LASER APPLICATION;  Surgeon: Alexis Frock, MD;  Location: [facility];  Service: Urology;  Laterality: Bilateral;   ROBOTIC ASSITED PARTIAL NEPHRECTOMY Left 12/22/2015   Procedure: XI ROBOTIC ASSITED PARTIAL NEPHRECTOMY;  Surgeon: Alexis Frock, MD;  Location: [facility];  Service: Urology;  Laterality: Left;   TOTAL ABDOMINAL HYSTERECTOMY W/ BILATERAL SALPINGOOPHORECTOMY  1980's   TOTAL KNEE ARTHROPLASTY Left 01/12/2017   Procedure: LEFT TOTAL KNEE ARTHROPLASTY and Right knee steroid injection;  Surgeon: Mcarthur Rossetti, MD;  Location: [facility];  Service: Orthopedics;  Laterality: Left;   TOTAL KNEE ARTHROPLASTY Right 04/14/2022   Procedure: RIGHT TOTAL KNEE ARTHROPLASTY;  Surgeon: Mcarthur Rossetti, MD;  Location: [facility];  Service: Orthopedics;  Laterality: Right;   Patient Active Problem List   Diagnosis Date Noted  Status post total right knee replacement 04/14/2022   Chest pain 10/09/2020   Nausea 10/09/2020   Hypertension    Type 2 diabetes mellitus ([facility])    GERD (gastroesophageal reflux disease)    Facet degeneration of lumbar region 12/18/2018   Chronic low back pain without sciatica 06/18/2017   Primary osteoarthritis of right knee 03/26/2017   Presence of left artificial knee joint 02/26/2017   Unilateral primary osteoarthritis, left knee 01/12/2017   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/12/2017   Renal mass 12/22/2015     REFERRING DIAG: Z96.651 (ICD-10-CM) - Status post right knee replacement  THERAPY DIAG:  Stiffness of right knee, not elsewhere classified  Muscle weakness (generalized)  Localized edema  Acute pain of right knee  Other abnormalities of gait and mobility  Unsteadiness on feet  Rationale for Evaluation and Treatment Rehabilitation  ONSET DATE: 04/14/2022 right TKR   SUBJECTIVE:    SUBJECTIVE STATEMENT: She took pain meds before PT today. She had some swelling since last appointment, mostly at night, and uses ice to relieve it.   PERTINENT HISTORY: OA, left TKR 2018, left kidney CA with partial nephrectomy 2017, HTN, obesity, DM2, LBP with sciatica RLE   PAIN:  NPRS scale: 0/10, since last PT appt highest 5/10 following session Pain location: Rt knee/lower leg Pain description: throbbing, stinging, burning Aggravating factors: WB activity, constant throughout day Relieving factors: meds, ice, elevation   PRECAUTIONS: None   WEIGHT BEARING RESTRICTIONS No   FALLS:  Has patient fallen in last 6 months? No   LIVING ENVIRONMENT: Lives with: lives with their spouse and lives with their daughter Lives in: House/apartment Stairs: Yes: External: 2 steps; on left going up Has following equipment at home: Single point cane, Walker - 2 wheeled, and built in Civil engineer, contracting   OCCUPATION: retired   PLOF: Independent, Independent with household mobility without device, and Independent with community mobility without device   PATIENT GOALS  walk without device, water aerobics,      OBJECTIVE:    DIAGNOSTIC FINDINGS:  04/14/22 Status post right knee arthroplasty with surgical sutures about the anterior aspect of the knee. Subcutaneous emphysema as expected. No perihardware fracture.   PATIENT SURVEYS:  04/19/2022 FOTO   43% with target 63%   COGNITION: 04/19/2022 Overall cognitive status: Within functional limits for tasks assessed                           SENSATION: 04/19/2022 Light touch: WFL   EDEMA:  04/19/2022 RLE:  above knee 61.3cm around knee  55cm  below knee 43.4cm LLE:  above knee 56.0 cm  around knee 45cm below knee  41cm   POSTURE: 6/28/203:  rounded shoulders, forward head, flexed trunk , and weight shift left   PALPATION: 6/28/203:  tenderness all areas of right knee, redness & warmth laterally and medially.    LOWER EXTREMITY ROM:   ROM P:passive  A:active Right 04/19/2022 Left 04/19/2022 Right 04/26/2022 Right  05/03/2022  Hip flexion          Hip extension          Hip abduction          Hip adduction          Hip internal rotation          Hip external rotation          Knee flexion Supine head propped up P: 47* Seated A: 42*  Supine AROM heel slide 68 deg Seated P: 77* A: 70*  Knee extension Supine P: -8* A: -30* SAQ     Seated LAQ A: -19*  Ankle dorsiflexion          Ankle plantarflexion          Ankle inversion          Ankle eversion           (Blank rows = not tested)   LOWER EXTREMITY MMT:   MMT Right 04/19/2022 Left 04/19/2022  Hip flexion      Hip extension      Hip abduction      Hip adduction      Hip internal rotation      Hip external rotation      Knee flexion 2-/5    Knee extension 2-/5    Ankle dorsiflexion      Ankle plantarflexion      Ankle inversion      Ankle eversion       (Blank rows = not tested)   BED MOBILITY:  04/19/2022  Patient requires total manual assist for RLE for sit to supine & supine to sit.     GAIT: 05/05/2022:  Household distance SPC use in Lt UE within clinic c SBA to supervision.  < 150 ft   04/28/2022:  FWW ambulation continued in clinic   6/28/203:  Distance walked: 50' Assistive device utilized: Environmental consultant - 2 wheeled Level of assistance: SBA Comments: antalgic gait with decreased stance RLE, right knee flexed in stance with minimal to no increase flexion for swing.      TODAY'S TREATMENT: 05/08/2022 Therex:             SciFit bike seat 11 BUEs  / BLEs rocking back / forth 4 mins, backwards cycling 2 min, forwards cycling 2 min Leg press BLE 81 lbs 5 sec hold flex & ext 2 sets x 10 reps, RLE 31 lbs x 10 reps Seated Rt knee LAQ and active knee flexion c end range 5 sec hold each directions 10 reps with contralateral LE opposing motion, verbal cueing for knee flexion              Neuro re-ed: In //bars on foam beam Standing cross ways eyes open 2 x 30s, eyes closed 2 x 30s Stepping forward off beam alternating no UE support down, BUE support up x 3 each Stepping backwards off beam alternating no UE support down, BUE support up x 3 each, x 4 each with slight posterior perturbation   TherActivity:             Pt ambulated 77 ft with single point cane in LUE safely including up and down curb and ramp, with no knee instability or losses of balance. PT recommended to use it in the household and community and RW for longer or unsteady environments with verbalized understanding from pt   Manual therapy:  PROM with manual pressure and tibial IR/distraction for knee flexion, contract-relax antagonist   Vasopneumatic             Supine head elevated 10 mins 34 deg medium compression c R leg in elevation   05/05/2022 Therex:             Nustep Lvl 5, 8 mins UE/LE   TherActivity:             Sit to /from stand with slow lowering and seat tap c bed at mid-thigh height x  8 reps, verbal cueing for foot placement and occasional UE support on cane and bed for stability             Pt ambulated 50 ft with single point cane in LUE safely with no knee instability or losses of balance, PT recommended to use it in the household with verbalized understanding from pt              Neuro re-ed:             Single leg stance with 3 cone tapping (anteromedial, anterior, lateral) 1 x 5 reps each leg RUE support on RW, 1 x 5 reps each leg LUE support Alternating posterior leg kicks 1 x 15 with BUE support on RW, verbal cueing for glute activation with hip  forward and upright posture Modified tandem stance on ground (leg forward but not directly in line) eyes closed 1 x 30 sec each leg, no losses of balance   Manual therapy:  PROM with manual pressure and tibial IR/distraction for knee flexion   Vasopneumatic             Supine head elevated 10 mins 34 deg medium compression c R leg in elevation   05/03/2022 Therex:             Leg press BLE 75 lbs 5 sec hold flex & ext 2 sets x 10 reps, RLE 31 lbs x 10 reps             SciFit bike seat 11 rocking back / forth 8 mins BUEs / BLEs             Seated Rt knee LAQ and active knee flexion c end range 5 sec hold each directions x30 reps with contralateral LE opposing motion, 2lb ankle weight RLE              Neuro re-ed:             Modified tandem stance on foam beam (leg forward but not directly in line) 30 sec x 1 bilateral, multiple losses of balance each direction to grab sink or walker RLE single leg stance with anterior 3 cone tapping x 3 reps BUE on RW, x3 reps LUE support, x3 reps RUE support   Manual therapy:  PROM with overpressure and contract-relax for knee flexion   Vasopneumatic             Supine head elevated 10 mins 34 deg medium compression c leg in elevation     PATIENT EDUCATION:  Education details: Access Code: [place] Person educated: Patient Education method: Explanation, Demonstration, Tactile cues, Verbal cues, and Handouts Education comprehension: verbalized understanding, returned demonstration, verbal cues required, tactile cues required, and needs further education     HOME EXERCISE PROGRAM: Access Code: [email or web address] URL: [email or web address] Date: 04/19/2022 Prepared by: Jamey Reas   Exercises - Seated Knee Flexion Extension AROM   - 2-4 x daily - 7 x weekly - 2 sets - 10 reps - 5 seconds hold - Seated Hamstring Stretch with Strap  - 2-4 x daily - 7 x weekly - 1 sets - 2-3 reps - 30 seconds hold - Seated Quad Set  - 2-4 x daily - 7 x weekly  - 2 sets - 10 reps - 5 seconds hold - Supine Short Arc Quad  - 2-4 x daily - 7 x weekly - 2 sets - 10 reps - 5 seconds hold - Supine Heel Slide with Strap  - 2-4  x daily - 7 x weekly - 2 sets - 10 reps - 5 seconds hold - Quad Setting and Stretching  - 2-4 x daily - 7 x weekly - 5-10 sets - 10 reps - prop 5-10 minutes & quad set5 seconds hold   ASSESSMENT:   CLINICAL IMPRESSION: She improved to completing full revolutions around the bike and is progressing in standing balance tasks. She appears safe to use a single point cane in the household and community. Range of motion continues to be limited by pain and edema, and flexion range of motion should continue to be emphasized in anticipation of the short term goal target date. She continues to benefit from skilled PT to address balance, strength, and knee mobility deficits.   OBJECTIVE IMPAIRMENTS Abnormal gait, decreased activity tolerance, decreased balance, decreased endurance, decreased knowledge of use of DME, decreased mobility, difficulty walking, decreased ROM, decreased strength, increased edema, impaired flexibility, postural dysfunction, obesity, and pain.    ACTIVITY LIMITATIONS carrying, lifting, bending, sitting, standing, squatting, sleeping, stairs, transfers, bed mobility, and locomotion level   PARTICIPATION LIMITATIONS: meal prep, cleaning, driving, and community activity   PERSONAL FACTORS Age, Fitness, Past/current experiences, and 3+ comorbidities: see PMH  are also affecting patient's functional outcome.    REHAB POTENTIAL: Good   CLINICAL DECISION MAKING: Stable/uncomplicated   EVALUATION COMPLEXITY: Low     GOALS: Goals reviewed with patient? Yes   SHORT TERM GOALS: Target date: 05/19/2022   Patient independent and verbalizes compliance with initial HEP Baseline: SEE OBJECTIVE DATA Goal status: on going - 04/26/2022   2.  Patient reports 50% improvement in right knee pain. Baseline: SEE OBJECTIVE DATA Goal  status: on going - 04/26/2022   3.  PROM right knee extension -3* to flexion 90* Baseline: SEE OBJECTIVE DATA Goal status: on going - 04/26/2022   LONG TERM GOALS: Target date: 07/07/2022   Patient will improve FOTO score to 63% Baseline: SEE OBJECTIVE DATA Goal status: INITIAL   2.  Patient reports right knee pain </= 2/10 with standing & gait activities.  Baseline: SEE OBJECTIVE DATA Goal status: INITIAL   3.  Right Knee PROM 0* extension to 100* flexion Baseline: SEE OBJECTIVE DATA Goal status: INITIAL   4.  Right Knee AROM seated -3* extension to 90* flexion Baseline: SEE OBJECTIVE DATA Goal status: INITIAL   5.  Patient ambulates >300' community distances including negotiating ramps, curbs & stairs with LRAD independently. Baseline: SEE OBJECTIVE DATA Goal status: INITIAL       PLAN: PT FREQUENCY: 2-3 x/wk   PT DURATION: 12 weeks   PLANNED INTERVENTIONS: Therapeutic exercises, Therapeutic activity, Neuromuscular re-education, Balance training, Gait training, Patient/Family education, Joint mobilization, Stair training, DME instructions, Aquatic Therapy, Electrical stimulation, Cryotherapy, Moist heat, scar mobilization, Taping, Vasopneumatic device, Manual therapy, and physical performance testing   PLAN FOR NEXT SESSION:  Next visit will be 10th visit progress note and do interim FOTO. Continue manual therapy for flexion and extension with pain management strategies. Progressive strengthening and balance can continue to be progressed, including with functional activity, balance during gait, and reactive balance strategy. Vaso to end.   Jana Hakim, SPT 05/08/2022, 3:53 PM    This entire session of physical therapy was performed under the direct supervision of PT signing evaluation /treatment. PT reviewed note and agrees.   Jamey Reas, PT, DPT 05/08/22  3:54 PM

## 2022-05-09 NOTE — Therapy (Signed)
OUTPATIENT PHYSICAL THERAPY TREATMENT NOTE   Patient Name: Debbie Velasquez MRN: [id number] DOB:20-Jan-1951, 71 y.o., female Today's Date: 05/09/2022  PCP: Nolene Ebbs, MD REFERRING PROVIDER: Mcarthur Rossetti, MD  END OF SESSION:     Past Medical History:  Diagnosis Date   Anemia    Arthritis    Cancer of left kidney ([place])    Left renal mass   GERD (gastroesophageal reflux disease)    History of kidney stones    x3 -remains with one on right.    Hx of seasonal allergies    rare flare ups   Hypertension    Left renal mass    Obesity    Pneumonia    hx of 20 years ago   Right ureteral stone    Type 2 diabetes mellitus ([place])    Type 2   Wears glasses    Wears partial dentures    upper   Past Surgical History:  Procedure Laterality Date   CESAREAN SECTION  yrs ago   CYSTOSCOPY WITH RETROGRADE PYELOGRAM, URETEROSCOPY AND STENT PLACEMENT Left 05/24/2018   Procedure: CYSTOSCOPY WITH RETROGRADE PYELOGRAM, URETEROSCOPY AND STENT PLACEMENT;  Surgeon: Alexis Frock, MD;  Location: [facility];  Service: Urology;  Laterality: Left;   CYSTOSCOPY WITH RETROGRADE PYELOGRAM, URETEROSCOPY AND STENT PLACEMENT Left 02/27/2020   Procedure: CYSTOSCOPY WITH RETROGRADE PYELOGRAM, URETEROSCOPY AND STENT PLACEMENT;  Surgeon: Alexis Frock, MD;  Location: [facility];  Service: Urology;  Laterality: Left;  1 HR   CYSTOSCOPY WITH RETROGRADE PYELOGRAM, URETEROSCOPY AND STENT PLACEMENT Bilateral 10/20/2020   Procedure: CYSTOSCOPY WITH RETROGRADE PYELOGRAM, URETEROSCOPY AND STENT PLACEMENT;  Surgeon: Alexis Frock, MD;  Location: [facility];  Service: Urology;  Laterality: Bilateral;  75 MINS   HOLMIUM LASER APPLICATION Left 10/29/6158   Procedure: HOLMIUM LASER APPLICATION;  Surgeon: Alexis Frock, MD;  Location: [facility];  Service: Urology;  Laterality: Left;   HOLMIUM LASER APPLICATION Left 04/24/7105   Procedure: HOLMIUM LASER APPLICATION;  Surgeon: Alexis Frock, MD;  Location: [facility];   Service: Urology;  Laterality: Left;   HOLMIUM LASER APPLICATION Bilateral 26/94/8546   Procedure: HOLMIUM LASER APPLICATION;  Surgeon: Alexis Frock, MD;  Location: [facility];  Service: Urology;  Laterality: Bilateral;   ROBOTIC ASSITED PARTIAL NEPHRECTOMY Left 12/22/2015   Procedure: XI ROBOTIC ASSITED PARTIAL NEPHRECTOMY;  Surgeon: Alexis Frock, MD;  Location: [facility];  Service: Urology;  Laterality: Left;   TOTAL ABDOMINAL HYSTERECTOMY W/ BILATERAL SALPINGOOPHORECTOMY  1980's   TOTAL KNEE ARTHROPLASTY Left 01/12/2017   Procedure: LEFT TOTAL KNEE ARTHROPLASTY and Right knee steroid injection;  Surgeon: Mcarthur Rossetti, MD;  Location: [facility];  Service: Orthopedics;  Laterality: Left;   TOTAL KNEE ARTHROPLASTY Right 04/14/2022   Procedure: RIGHT TOTAL KNEE ARTHROPLASTY;  Surgeon: Mcarthur Rossetti, MD;  Location: [facility];  Service: Orthopedics;  Laterality: Right;   Patient Active Problem List   Diagnosis Date Noted   Status post total right knee replacement 04/14/2022   Chest pain 10/09/2020   Nausea 10/09/2020   Hypertension    Type 2 diabetes mellitus ([facility])    GERD (gastroesophageal reflux disease)    Facet degeneration of lumbar region 12/18/2018   Chronic low back pain without sciatica 06/18/2017   Primary osteoarthritis of right knee 03/26/2017   Presence of left artificial knee joint 02/26/2017   Unilateral primary osteoarthritis, left knee 01/12/2017   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/12/2017   Renal mass 12/22/2015    REFERRING DIAG: Z96.651 (ICD-10-CM) - Status post  right knee replacement  THERAPY DIAG:  No diagnosis found.  Rationale for Evaluation and Treatment Rehabilitation  ONSET DATE: 04/14/2022 right TKR   SUBJECTIVE:    SUBJECTIVE STATEMENT: *** She took pain meds before PT today. She had some swelling since last appointment, mostly at night, and uses ice to relieve it.   PERTINENT HISTORY: OA, left TKR 2018, left kidney CA with partial  nephrectomy 2017, HTN, obesity, DM2, LBP with sciatica RLE   PAIN:  NPRS scale: *** 0/10, since last PT appt highest 5/10 following session Pain location: Rt knee/lower leg Pain description: throbbing, stinging, burning Aggravating factors: WB activity, constant throughout day Relieving factors: meds, ice, elevation   PRECAUTIONS: None   WEIGHT BEARING RESTRICTIONS No   FALLS:  Has patient fallen in last 6 months? No   LIVING ENVIRONMENT: Lives with: lives with their spouse and lives with their daughter Lives in: House/apartment Stairs: Yes: External: 2 steps; on left going up Has following equipment at home: Single point cane, Walker - 2 wheeled, and built in Civil engineer, contracting   OCCUPATION: retired   PLOF: Independent, Independent with household mobility without device, and Independent with community mobility without device   PATIENT GOALS  walk without device, water aerobics,      OBJECTIVE:    DIAGNOSTIC FINDINGS:  04/14/22 Status post right knee arthroplasty with surgical sutures about the anterior aspect of the knee. Subcutaneous emphysema as expected. No perihardware fracture.   PATIENT SURVEYS:  04/19/2022 FOTO   43% with target 63%   COGNITION: 04/19/2022 Overall cognitive status: Within functional limits for tasks assessed                          SENSATION: 04/19/2022 Light touch: WFL   EDEMA:  04/19/2022 RLE:  above knee 61.3cm around knee  55cm  below knee 43.4cm LLE:  above knee 56.0 cm  around knee 45cm below knee  41cm   POSTURE: 6/28/203:  rounded shoulders, forward head, flexed trunk , and weight shift left   PALPATION: 6/28/203:  tenderness all areas of right knee, redness & warmth laterally and medially.    LOWER EXTREMITY ROM:   ROM P:passive  A:active Right 04/19/2022 Left 04/19/2022 Right 04/26/2022 Right  05/03/2022 Right 05/10/2022  Hip flexion           Hip extension           Hip abduction           Hip adduction           Hip internal  rotation           Hip external rotation           Knee flexion Supine head propped up P: 47* Seated A: 42*   Supine AROM heel slide 68 deg Seated P: 77* A: 70*   Knee extension Supine P: -8* A: -30* SAQ     Seated LAQ A: -19*   Ankle dorsiflexion           Ankle plantarflexion           Ankle inversion           Ankle eversion            (Blank rows = not tested)   LOWER EXTREMITY MMT:   MMT Right 04/19/2022 Left 04/19/2022  Hip flexion      Hip extension      Hip abduction  Hip adduction      Hip internal rotation      Hip external rotation      Knee flexion 2-/5    Knee extension 2-/5    Ankle dorsiflexion      Ankle plantarflexion      Ankle inversion      Ankle eversion       (Blank rows = not tested)   BED MOBILITY:  04/19/2022  Patient requires total manual assist for RLE for sit to supine & supine to sit.     GAIT: 05/05/2022:  Household distance SPC use in Lt UE within clinic c SBA to supervision.  < 150 ft   04/28/2022:  FWW ambulation continued in clinic   6/28/203:  Distance walked: 50' Assistive device utilized: Environmental consultant - 2 wheeled Level of assistance: SBA Comments: antalgic gait with decreased stance RLE, right knee flexed in stance with minimal to no increase flexion for swing.      TODAY'S TREATMENT: 05/10/2022 *** Sci fit, leg press, seated leg extension for eccentric, heavy manual  05/08/2022 Therex:             SciFit bike seat 11 BUEs / BLEs rocking back / forth 4 mins, backwards cycling 2 min, forwards cycling 2 min Leg press BLE 81 lbs 5 sec hold flex & ext 2 sets x 10 reps, RLE 31 lbs x 10 reps Seated Rt knee LAQ and active knee flexion c end range 5 sec hold each directions 10 reps with contralateral LE opposing motion, verbal cueing for knee flexion              Neuro re-ed: In //bars on foam beam Standing cross ways eyes open 2 x 30s, eyes closed 2 x 30s Stepping forward off beam alternating no UE support down, BUE support up x 3  each Stepping backwards off beam alternating no UE support down, BUE support up x 3 each, x 4 each with slight posterior perturbation   TherActivity:             Pt ambulated 77 ft with single point cane in LUE safely including up and down curb and ramp, with no knee instability or losses of balance. PT recommended to use it in the household and community and RW for longer or unsteady environments with verbalized understanding from pt   Manual therapy:  PROM with manual pressure and tibial IR/distraction for knee flexion, contract-relax antagonist   Vasopneumatic             Supine head elevated 10 mins 34 deg medium compression c R leg in elevation   05/05/2022 Therex:             Nustep Lvl 5, 8 mins UE/LE   TherActivity:             Sit to /from stand with slow lowering and seat tap c bed at mid-thigh height x 8 reps, verbal cueing for foot placement and occasional UE support on cane and bed for stability             Pt ambulated 50 ft with single point cane in LUE safely with no knee instability or losses of balance, PT recommended to use it in the household with verbalized understanding from pt              Neuro re-ed:             Single leg stance with 3 cone tapping (anteromedial, anterior, lateral) 1  x 5 reps each leg RUE support on RW, 1 x 5 reps each leg LUE support Alternating posterior leg kicks 1 x 15 with BUE support on RW, verbal cueing for glute activation with hip forward and upright posture Modified tandem stance on ground (leg forward but not directly in line) eyes closed 1 x 30 sec each leg, no losses of balance   Manual therapy:  PROM with manual pressure and tibial IR/distraction for knee flexion   Vasopneumatic             Supine head elevated 10 mins 34 deg medium compression c R leg in elevation   05/03/2022 Therex:             Leg press BLE 75 lbs 5 sec hold flex & ext 2 sets x 10 reps, RLE 31 lbs x 10 reps             SciFit bike seat 11 rocking back / forth  8 mins BUEs / BLEs             Seated Rt knee LAQ and active knee flexion c end range 5 sec hold each directions x30 reps with contralateral LE opposing motion, 2lb ankle weight RLE              Neuro re-ed:             Modified tandem stance on foam beam (leg forward but not directly in line) 30 sec x 1 bilateral, multiple losses of balance each direction to grab sink or walker RLE single leg stance with anterior 3 cone tapping x 3 reps BUE on RW, x3 reps LUE support, x3 reps RUE support   Manual therapy:  PROM with overpressure and contract-relax for knee flexion   Vasopneumatic             Supine head elevated 10 mins 34 deg medium compression c leg in elevation     PATIENT EDUCATION:  Education details: Access Code: [place] Person educated: Patient Education method: Explanation, Demonstration, Tactile cues, Verbal cues, and Handouts Education comprehension: verbalized understanding, returned demonstration, verbal cues required, tactile cues required, and needs further education     HOME EXERCISE PROGRAM: Access Code: [email or web address] URL: [email or web address] Date: 04/19/2022 Prepared by: Jamey Reas   Exercises - Seated Knee Flexion Extension AROM   - 2-4 x daily - 7 x weekly - 2 sets - 10 reps - 5 seconds hold - Seated Hamstring Stretch with Strap  - 2-4 x daily - 7 x weekly - 1 sets - 2-3 reps - 30 seconds hold - Seated Quad Set  - 2-4 x daily - 7 x weekly - 2 sets - 10 reps - 5 seconds hold - Supine Short Arc Quad  - 2-4 x daily - 7 x weekly - 2 sets - 10 reps - 5 seconds hold - Supine Heel Slide with Strap  - 2-4 x daily - 7 x weekly - 2 sets - 10 reps - 5 seconds hold - Quad Setting and Stretching  - 2-4 x daily - 7 x weekly - 5-10 sets - 10 reps - prop 5-10 minutes & quad set5 seconds hold   ASSESSMENT:   CLINICAL IMPRESSION: *** She improved to completing full revolutions around the bike and is progressing in standing balance tasks. She appears safe to use a  single point cane in the household and community. Range of motion continues to be limited by pain and edema, and flexion range of motion  should continue to be emphasized in anticipation of the short term goal target date. She continues to benefit from skilled PT to address balance, strength, and knee mobility deficits.   OBJECTIVE IMPAIRMENTS Abnormal gait, decreased activity tolerance, decreased balance, decreased endurance, decreased knowledge of use of DME, decreased mobility, difficulty walking, decreased ROM, decreased strength, increased edema, impaired flexibility, postural dysfunction, obesity, and pain.    ACTIVITY LIMITATIONS carrying, lifting, bending, sitting, standing, squatting, sleeping, stairs, transfers, bed mobility, and locomotion level   PARTICIPATION LIMITATIONS: meal prep, cleaning, driving, and community activity   PERSONAL FACTORS Age, Fitness, Past/current experiences, and 3+ comorbidities: see PMH  are also affecting patient's functional outcome.    REHAB POTENTIAL: Good   CLINICAL DECISION MAKING: Stable/uncomplicated   EVALUATION COMPLEXITY: Low     GOALS: Goals reviewed with patient? Yes   SHORT TERM GOALS: Target date: 05/19/2022   Patient independent and verbalizes compliance with initial HEP Baseline: SEE OBJECTIVE DATA Goal status: on going - 04/26/2022   2.  Patient reports 50% improvement in right knee pain. Baseline: SEE OBJECTIVE DATA Goal status: on going - 04/26/2022   3.  PROM right knee extension -3* to flexion 90* Baseline: SEE OBJECTIVE DATA Goal status: on going - 04/26/2022   LONG TERM GOALS: Target date: 07/07/2022   Patient will improve FOTO score to 63% Baseline: SEE OBJECTIVE DATA Goal status: INITIAL   2.  Patient reports right knee pain </= 2/10 with standing & gait activities.  Baseline: SEE OBJECTIVE DATA Goal status: INITIAL   3.  Right Knee PROM 0* extension to 100* flexion Baseline: SEE OBJECTIVE DATA Goal status: INITIAL    4.  Right Knee AROM seated -3* extension to 90* flexion Baseline: SEE OBJECTIVE DATA Goal status: INITIAL   5.  Patient ambulates >300' community distances including negotiating ramps, curbs & stairs with LRAD independently. Baseline: SEE OBJECTIVE DATA Goal status: INITIAL       PLAN: PT FREQUENCY: 2-3 x/wk   PT DURATION: 12 weeks   PLANNED INTERVENTIONS: Therapeutic exercises, Therapeutic activity, Neuromuscular re-education, Balance training, Gait training, Patient/Family education, Joint mobilization, Stair training, DME instructions, Aquatic Therapy, Electrical stimulation, Cryotherapy, Moist heat, scar mobilization, Taping, Vasopneumatic device, Manual therapy, and physical performance testing   PLAN FOR NEXT SESSION:  *** Next visit will be 10th visit progress note and do interim FOTO. Continue manual therapy for flexion and extension with pain management strategies. Progressive strengthening and balance can continue to be progressed, including with functional activity, balance during gait, and reactive balance strategy. Vaso to end.   Jana Hakim, SPT 05/09/2022, 3:53 PM    This entire session of physical therapy was performed under the direct supervision of PT signing evaluation /treatment. PT reviewed note and agrees.   Jamey Reas, PT, DPT 05/09/22  3:54 PM

## 2022-05-10 ENCOUNTER — Encounter: Payer: Self-pay | Admitting: Physical Therapy

## 2022-05-10 ENCOUNTER — Ambulatory Visit (INDEPENDENT_AMBULATORY_CARE_PROVIDER_SITE_OTHER): Payer: Medicare (Managed Care) | Admitting: Physical Therapy

## 2022-05-10 DIAGNOSIS — R2689 Other abnormalities of gait and mobility: Secondary | ICD-10-CM

## 2022-05-10 DIAGNOSIS — M25661 Stiffness of right knee, not elsewhere classified: Secondary | ICD-10-CM

## 2022-05-10 DIAGNOSIS — M6281 Muscle weakness (generalized): Secondary | ICD-10-CM

## 2022-05-10 DIAGNOSIS — R2681 Unsteadiness on feet: Secondary | ICD-10-CM

## 2022-05-10 DIAGNOSIS — R600 Localized edema: Secondary | ICD-10-CM | POA: Diagnosis not present

## 2022-05-10 DIAGNOSIS — M25561 Pain in right knee: Secondary | ICD-10-CM

## 2022-05-12 ENCOUNTER — Ambulatory Visit (INDEPENDENT_AMBULATORY_CARE_PROVIDER_SITE_OTHER): Payer: Medicare (Managed Care) | Admitting: Rehabilitative and Restorative Service Providers"

## 2022-05-12 ENCOUNTER — Encounter: Payer: Self-pay | Admitting: Rehabilitative and Restorative Service Providers"

## 2022-05-12 DIAGNOSIS — R2689 Other abnormalities of gait and mobility: Secondary | ICD-10-CM

## 2022-05-12 DIAGNOSIS — M25561 Pain in right knee: Secondary | ICD-10-CM

## 2022-05-12 DIAGNOSIS — R600 Localized edema: Secondary | ICD-10-CM | POA: Diagnosis not present

## 2022-05-12 DIAGNOSIS — R2681 Unsteadiness on feet: Secondary | ICD-10-CM

## 2022-05-12 DIAGNOSIS — M25661 Stiffness of right knee, not elsewhere classified: Secondary | ICD-10-CM

## 2022-05-12 DIAGNOSIS — M6281 Muscle weakness (generalized): Secondary | ICD-10-CM

## 2022-05-12 NOTE — Therapy (Signed)
OUTPATIENT PHYSICAL THERAPY TREATMENT NOTE   Patient Name: Debbie Velasquez MRN: [id number] DOB:Sep 10, 1951, 71 y.o., female Today's Date: 05/12/2022  PCP: Fleet Contras, MD REFERRING PROVIDER: Kathryne Hitch, MD     END OF SESSION:   PT End of Session - 05/12/22 1348     Visit Number 11    Number of Visits 28    Date for PT Re-Evaluation 07/07/22    Authorization Type [employer]    Progress Note Due on Visit 20    PT Start Time 1341    PT Stop Time 1430    PT Time Calculation (min) 49 min    Activity Tolerance Patient tolerated treatment well    Behavior During Therapy WFL for tasks assessed/performed              Past Medical History:  Diagnosis Date   Anemia    Arthritis    Cancer of left kidney (HCC)    Left renal mass   GERD (gastroesophageal reflux disease)    History of kidney stones    x3 -remains with one on right.    Hx of seasonal allergies    rare flare ups   Hypertension    Left renal mass    Obesity    Pneumonia    hx of 20 years ago   Right ureteral stone    Type 2 diabetes mellitus (HCC)    Type 2   Wears glasses    Wears partial dentures    upper   Past Surgical History:  Procedure Laterality Date   CESAREAN SECTION  yrs ago   CYSTOSCOPY WITH RETROGRADE PYELOGRAM, URETEROSCOPY AND STENT PLACEMENT Left 05/24/2018   Procedure: CYSTOSCOPY WITH RETROGRADE PYELOGRAM, URETEROSCOPY AND STENT PLACEMENT;  Surgeon: Sebastian Ache, MD;  Location: [facility];  Service: Urology;  Laterality: Left;   CYSTOSCOPY WITH RETROGRADE PYELOGRAM, URETEROSCOPY AND STENT PLACEMENT Left 02/27/2020   Procedure: CYSTOSCOPY WITH RETROGRADE PYELOGRAM, URETEROSCOPY AND STENT PLACEMENT;  Surgeon: Sebastian Ache, MD;  Location: [facility];  Service: Urology;  Laterality: Left;  1 HR   CYSTOSCOPY WITH RETROGRADE PYELOGRAM, URETEROSCOPY AND STENT PLACEMENT Bilateral 10/20/2020   Procedure: CYSTOSCOPY WITH RETROGRADE PYELOGRAM, URETEROSCOPY AND STENT PLACEMENT;   Surgeon: Sebastian Ache, MD;  Location: [facility];  Service: Urology;  Laterality: Bilateral;  75 MINS   HOLMIUM LASER APPLICATION Left 05/24/2018   Procedure: HOLMIUM LASER APPLICATION;  Surgeon: Sebastian Ache, MD;  Location: [facility];  Service: Urology;  Laterality: Left;   HOLMIUM LASER APPLICATION Left 02/27/2020   Procedure: HOLMIUM LASER APPLICATION;  Surgeon: Sebastian Ache, MD;  Location: [facility];  Service: Urology;  Laterality: Left;   HOLMIUM LASER APPLICATION Bilateral 10/20/2020   Procedure: HOLMIUM LASER APPLICATION;  Surgeon: Sebastian Ache, MD;  Location: [facility];  Service: Urology;  Laterality: Bilateral;   ROBOTIC ASSITED PARTIAL NEPHRECTOMY Left 12/22/2015   Procedure: XI ROBOTIC ASSITED PARTIAL NEPHRECTOMY;  Surgeon: Sebastian Ache, MD;  Location: [facility];  Service: Urology;  Laterality: Left;   TOTAL ABDOMINAL HYSTERECTOMY W/ BILATERAL SALPINGOOPHORECTOMY  1980's   TOTAL KNEE ARTHROPLASTY Left 01/12/2017   Procedure: LEFT TOTAL KNEE ARTHROPLASTY and Right knee steroid injection;  Surgeon: Kathryne Hitch, MD;  Location: [facility];  Service: Orthopedics;  Laterality: Left;   TOTAL KNEE ARTHROPLASTY Right 04/14/2022   Procedure: RIGHT TOTAL KNEE ARTHROPLASTY;  Surgeon: Kathryne Hitch, MD;  Location: [facility];  Service: Orthopedics;  Laterality: Right;   Patient Active Problem List   Diagnosis Date Noted  Status post total right knee replacement 04/14/2022   Chest pain 10/09/2020   Nausea 10/09/2020   Hypertension    Type 2 diabetes mellitus (HCC)    GERD (gastroesophageal reflux disease)    Facet degeneration of lumbar region 12/18/2018   Chronic low back pain without sciatica 06/18/2017   Primary osteoarthritis of right knee 03/26/2017   Presence of left artificial knee joint 02/26/2017   Unilateral primary osteoarthritis, left knee 01/12/2017   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/12/2017   Renal mass 12/22/2015    REFERRING DIAG: Z96.651 (ICD-10-CM) -  Status post right knee replacement  THERAPY DIAG:  Stiffness of right knee, not elsewhere classified  Muscle weakness (generalized)  Localized edema  Acute pain of right knee  Other abnormalities of gait and mobility  Unsteadiness on feet  Rationale for Evaluation and Treatment Rehabilitation  ONSET DATE: 04/14/2022 right TKR   SUBJECTIVE:    SUBJECTIVE STATEMENT: Pt indicated no pain at rest prior to appointment.  Pain at most in last 24 hours - 6/10.  Pt indicated alittle change on sleeping.    PERTINENT HISTORY: OA, left TKR 2018, left kidney CA with partial nephrectomy 2017, HTN, obesity, DM2, LBP with sciatica RLE   PAIN:  NPRS scale: 0/10 upon arrival.  Pain location: Rt knee/lower leg Pain description: throbbing, stinging, burning Aggravating factors: increased activity.  Relieving factors: meds, ice, elevation   PRECAUTIONS: None   WEIGHT BEARING RESTRICTIONS No   FALLS:  Has patient fallen in last 6 months? No   LIVING ENVIRONMENT: Lives with: lives with their spouse and lives with their daughter Lives in: House/apartment Stairs: Yes: External: 2 steps; on left going up Has following equipment at home: Single point cane, Walker - 2 wheeled, and built in Civil engineer, contracting   OCCUPATION: retired   PLOF: Independent, Independent with household mobility without device, and Independent with community mobility without device   PATIENT GOALS  walk without device, water aerobics,      OBJECTIVE:    DIAGNOSTIC FINDINGS:  04/14/22 Status post right knee arthroplasty with surgical sutures about the anterior aspect of the knee. Subcutaneous emphysema as expected. No perihardware fracture.   PATIENT SURVEYS:  04/19/2022 FOTO   43% with target 63% 05/10/2022 FOTO   55%   COGNITION: 04/19/2022 Overall cognitive status: Within functional limits for tasks assessed                          SENSATION: 04/19/2022 Light touch: WFL   EDEMA:  04/19/2022 RLE:  above knee  61.3cm around knee  55cm  below knee 43.4cm LLE:  above knee 56.0 cm  around knee 45cm below knee  41cm   POSTURE: 6/28/203:  rounded shoulders, forward head, flexed trunk , and weight shift left   PALPATION: 6/28/203:  tenderness all areas of right knee, redness & warmth laterally and medially.    LOWER EXTREMITY ROM:   ROM P:passive  A:active Right 04/19/2022 Left 04/19/2022 Right 04/26/2022 Right  05/03/2022 Right 05/10/2022  Hip flexion           Hip extension           Hip abduction           Hip adduction           Hip internal rotation           Hip external rotation           Knee flexion Supine  head propped up P: 47* Seated A: 42*   Supine AROM heel slide 68 deg Seated P: 77* A: 70* Seated P: 95* A: 90* after manual  Knee extension Supine P: -8* A: -30* SAQ     Seated LAQ A: -19* Supine P: -9* A:-15* After manual  Ankle dorsiflexion           Ankle plantarflexion           Ankle inversion           Ankle eversion            (Blank rows = not tested)   LOWER EXTREMITY MMT:   MMT Right 04/19/2022 Left 04/19/2022  Hip flexion      Hip extension      Hip abduction      Hip adduction      Hip internal rotation      Hip external rotation      Knee flexion 2-/5    Knee extension 2-/5    Ankle dorsiflexion      Ankle plantarflexion      Ankle inversion      Ankle eversion       (Blank rows = not tested)   BED MOBILITY:   04/19/2022  Patient requires total manual assist for RLE for sit to supine & supine to sit.   05/05/2022  Patient moves RLE independently for sit to supine & supine to sit, and requires min assistance for raising RLE to elevated heights   GAIT: 05/10/2022:  pt amb in & out of clinic with [facility] safely >250' 05/05/2022:  Household distance SPC use in [place] UE within clinic c SBA to supervision.  < 150 ft   04/28/2022:  FWW ambulation continued in clinic   6/28/203:  Distance walked: 50' Assistive device utilized: Environmental consultant - 2 wheeled Level of  assistance: SBA Comments: antalgic gait with decreased stance RLE, right knee flexed in stance with minimal to no increase flexion for swing.      TODAY'S TREATMENT: 05/12/2022 Therex:             SciFit bike seat 12 BUEs / BLEs rocking back / forth 1.5 mins, backwards cycling 1 min, 3 mins fwd slow turning Leg press double leg 81 lbs 2 x 10 , Rt leg 37 lbs 2 x 10 Seated Rt knee flexion c Lt leg overpressure 10 sec x 6 (cues for use) Seated SLR x 10 on Rt  Neuro Re-ed  Church pew anterior/posterior ankle strategy on foam 2 mins c SBA occasional min A to prevent loss of balance  Feet together as close as she could eyes open 30 sec, eyes closed 45 seconds  Standing on foam bilateral finger tip assist c SBA hip extension x 10 bilateral  On foam- modified tandem stance c SBA 30 sec x 1 bilateral  Manual Therapy  Seated Rt knee flexion c distraction/IR mobilization c movement.  Contract relax techniques for Rt knee flexion.   Vasopneumatic             Supine head elevated 10 mins 34 deg medium compression c R leg in elevation  05/10/2022 Therex:             SciFit bike seat 11 BUEs / BLEs rocking back / forth 1 mins, backwards cycling 2 min, forwards cycling 5 min level 1 Seated leg extension BLE concentric RLE eccentric 5# 2 x 10 reps Seated hamstring curl BLE 20# x 15 reps  Manual Therapy  Seated knee flexion PROM with overpressure to tolerance  Supine knee extension with towel roll prop grade 1 & 2 mobilizations  Vasopneumatic             Supine head elevated 10 mins 34 deg medium compression c R leg in elevation  05/08/2022 Therex:             SciFit bike seat 11 BUEs / BLEs rocking back / forth 4 mins, backwards cycling 2 min, forwards cycling 2 min Leg press BLE 81 lbs 5 sec hold flex & ext 2 sets x 10 reps, RLE 31 lbs x 10 reps Seated Rt knee LAQ and active knee flexion c end range 5 sec hold each directions 10 reps with contralateral LE opposing motion, verbal cueing for knee  flexion              Neuro re-ed: In //bars on foam beam Standing cross ways eyes open 2 x 30s, eyes closed 2 x 30s Stepping forward off beam alternating no UE support down, BUE support up x 3 each Stepping backwards off beam alternating no UE support down, BUE support up x 3 each, x 4 each with slight posterior perturbation   TherActivity:             Pt ambulated 77 ft with single point cane in LUE safely including up and down curb and ramp, with no knee instability or losses of balance. PT recommended to use it in the household and community and RW for longer or unsteady environments with verbalized understanding from pt   Manual therapy:  PROM with manual pressure and tibial IR/distraction for knee flexion, contract-relax antagonist   Vasopneumatic             Supine head elevated 10 mins 34 deg medium compression c R leg in elevation     PATIENT EDUCATION:  Education details: Access Code: [place] educated: Patient Education method: Explanation, Demonstration, Tactile cues, Verbal cues, and Handouts Education comprehension: verbalized understanding, returned demonstration, verbal cues required, tactile cues required, and needs further education     HOME EXERCISE PROGRAM: Access Code: [place] URL: [email or web address] Date: 04/19/2022 Prepared by: Jamey Reas   Exercises - Seated Knee Flexion Extension AROM   - 2-4 x daily - 7 x weekly - 2 sets - 10 reps - 5 seconds hold - Seated Hamstring Stretch with Strap  - 2-4 x daily - 7 x weekly - 1 sets - 2-3 reps - 30 seconds hold - Seated Quad Set  - 2-4 x daily - 7 x weekly - 2 sets - 10 reps - 5 seconds hold - Supine Short Arc Quad  - 2-4 x daily - 7 x weekly - 2 sets - 10 reps - 5 seconds hold - Supine Heel Slide with Strap  - 2-4 x daily - 7 x weekly - 2 sets - 10 reps - 5 seconds hold - Quad Setting and Stretching  - 2-4 x daily - 7 x weekly - 5-10 sets - 10 reps - prop 5-10 minutes & quad set5 seconds  hold   ASSESSMENT:   CLINICAL IMPRESSION: Pt continued to present c flexion and extension end range limitations at this time.  Continued skilled PT services indicated to continue to improve mobility as well as improve overall strength for functional activity.    OBJECTIVE IMPAIRMENTS Abnormal gait, decreased activity tolerance, decreased balance, decreased endurance, decreased knowledge of use of DME, decreased mobility, difficulty walking, decreased ROM, decreased strength, increased  edema, impaired flexibility, postural dysfunction, obesity, and pain.    ACTIVITY LIMITATIONS carrying, lifting, bending, sitting, standing, squatting, sleeping, stairs, transfers, bed mobility, and locomotion level   PARTICIPATION LIMITATIONS: meal prep, cleaning, driving, and community activity   PERSONAL FACTORS Age, Fitness, Past/current experiences, and 3+ comorbidities: see PMH  are also affecting patient's functional outcome.    REHAB POTENTIAL: Good   CLINICAL DECISION MAKING: Stable/uncomplicated   EVALUATION COMPLEXITY: Low     GOALS: Goals reviewed with patient? Yes   SHORT TERM GOALS: Target date: 05/19/2022   Patient independent and verbalizes compliance with initial HEP Baseline: SEE OBJECTIVE DATA Goal status: met 05/10/22   2.  Patient reports 50% improvement in right knee pain. Baseline: SEE OBJECTIVE DATA Goal status: met 05/10/22   3.  PROM right knee extension -3* to flexion 90* Baseline: SEE OBJECTIVE DATA Goal status: partially met - 05/10/2022   LONG TERM GOALS: Target date: 07/07/2022   Patient will improve FOTO score to 63% Baseline: SEE OBJECTIVE DATA Goal status: on going - assessed 05/12/2022   2.  Patient reports right knee pain </= 2/10 with standing & gait activities.  Baseline: SEE OBJECTIVE DATA Goal status: on going - assessed 05/12/2022   3.  Right Knee PROM 0* extension to 100* flexion Baseline: SEE OBJECTIVE DATA Goal status: on going - assessed  05/12/2022   4.  Right Knee AROM seated -3* extension to 90* flexion Baseline: SEE OBJECTIVE DATA Goal status: on going - assessed 05/12/2022   5.  Patient ambulates >300' community distances including negotiating ramps, curbs & stairs with LRAD independently. Baseline: SEE OBJECTIVE DATA Goal status: on going - assessed 05/12/2022       PLAN: PT FREQUENCY: 2-3 x/wk   PT DURATION: 12 weeks   PLANNED INTERVENTIONS: Therapeutic exercises, Therapeutic activity, Neuromuscular re-education, Balance training, Gait training, Patient/Family education, Joint mobilization, Stair training, DME instructions, Aquatic Therapy, Electrical stimulation, Cryotherapy, Moist heat, scar mobilization, Taping, Vasopneumatic device, Manual therapy, and physical performance testing   PLAN FOR NEXT SESSION:  End range mobility gains, progressive strengthening.  SLR for home use and TKE stretch review   Scot Jun, PT, DPT, OCS, ATC 05/12/22  2:27 PM

## 2022-05-15 ENCOUNTER — Encounter: Payer: Self-pay | Admitting: Physical Therapy

## 2022-05-15 ENCOUNTER — Ambulatory Visit (INDEPENDENT_AMBULATORY_CARE_PROVIDER_SITE_OTHER): Payer: Medicare (Managed Care) | Admitting: Physical Therapy

## 2022-05-15 DIAGNOSIS — M25661 Stiffness of right knee, not elsewhere classified: Secondary | ICD-10-CM | POA: Diagnosis not present

## 2022-05-15 DIAGNOSIS — R2681 Unsteadiness on feet: Secondary | ICD-10-CM

## 2022-05-15 DIAGNOSIS — M25561 Pain in right knee: Secondary | ICD-10-CM | POA: Diagnosis not present

## 2022-05-15 DIAGNOSIS — R600 Localized edema: Secondary | ICD-10-CM

## 2022-05-15 DIAGNOSIS — M6281 Muscle weakness (generalized): Secondary | ICD-10-CM | POA: Diagnosis not present

## 2022-05-15 DIAGNOSIS — R2689 Other abnormalities of gait and mobility: Secondary | ICD-10-CM

## 2022-05-15 NOTE — Therapy (Signed)
OUTPATIENT PHYSICAL THERAPY TREATMENT NOTE   Patient Name: Debbie Velasquez MRN: [id number] DOB:1951/05/09, 71 y.o., female Today's Date: 05/15/2022  PCP: Nolene Ebbs, MD REFERRING PROVIDER: Mcarthur Rossetti, MD  END OF SESSION:   PT End of Session - 05/15/22 1419     Visit Number 12    Number of Visits 28    Date for PT Re-Evaluation 07/07/22    Authorization Type [employer]    Progress Note Due on Visit 20    PT Start Time 1429    PT Stop Time 1527    PT Time Calculation (min) 58 min    Activity Tolerance Patient tolerated treatment well;Patient limited by pain;Patient limited by fatigue    Behavior During Therapy [facility] for tasks assessed/performed             Past Medical History:  Diagnosis Date   Anemia    Arthritis    Cancer of left kidney (HCC)    Left renal mass   GERD (gastroesophageal reflux disease)    History of kidney stones    x3 -remains with one on right.    Hx of seasonal allergies    rare flare ups   Hypertension    Left renal mass    Obesity    Pneumonia    hx of 20 years ago   Right ureteral stone    Type 2 diabetes mellitus ([place])    Type 2   Wears glasses    Wears partial dentures    upper   Past Surgical History:  Procedure Laterality Date   CESAREAN SECTION  yrs ago   CYSTOSCOPY WITH RETROGRADE PYELOGRAM, URETEROSCOPY AND STENT PLACEMENT Left 05/24/2018   Procedure: CYSTOSCOPY WITH RETROGRADE PYELOGRAM, URETEROSCOPY AND STENT PLACEMENT;  Surgeon: Alexis Frock, MD;  Location: [facility];  Service: Urology;  Laterality: Left;   CYSTOSCOPY WITH RETROGRADE PYELOGRAM, URETEROSCOPY AND STENT PLACEMENT Left 02/27/2020   Procedure: CYSTOSCOPY WITH RETROGRADE PYELOGRAM, URETEROSCOPY AND STENT PLACEMENT;  Surgeon: Alexis Frock, MD;  Location: [facility];  Service: Urology;  Laterality: Left;  1 HR   CYSTOSCOPY WITH RETROGRADE PYELOGRAM, URETEROSCOPY AND STENT PLACEMENT Bilateral 10/20/2020   Procedure: CYSTOSCOPY WITH RETROGRADE  PYELOGRAM, URETEROSCOPY AND STENT PLACEMENT;  Surgeon: Alexis Frock, MD;  Location: [facility];  Service: Urology;  Laterality: Bilateral;  75 MINS   HOLMIUM LASER APPLICATION Left 12/24/1960   Procedure: HOLMIUM LASER APPLICATION;  Surgeon: Alexis Frock, MD;  Location: [facility];  Service: Urology;  Laterality: Left;   HOLMIUM LASER APPLICATION Left 11/24/9796   Procedure: HOLMIUM LASER APPLICATION;  Surgeon: Alexis Frock, MD;  Location: [facility];  Service: Urology;  Laterality: Left;   HOLMIUM LASER APPLICATION Bilateral 92/08/9416   Procedure: HOLMIUM LASER APPLICATION;  Surgeon: Alexis Frock, MD;  Location: [facility];  Service: Urology;  Laterality: Bilateral;   ROBOTIC ASSITED PARTIAL NEPHRECTOMY Left 12/22/2015   Procedure: XI ROBOTIC ASSITED PARTIAL NEPHRECTOMY;  Surgeon: Alexis Frock, MD;  Location: [facility];  Service: Urology;  Laterality: Left;   TOTAL ABDOMINAL HYSTERECTOMY W/ BILATERAL SALPINGOOPHORECTOMY  1980's   TOTAL KNEE ARTHROPLASTY Left 01/12/2017   Procedure: LEFT TOTAL KNEE ARTHROPLASTY and Right knee steroid injection;  Surgeon: Mcarthur Rossetti, MD;  Location: [facility];  Service: Orthopedics;  Laterality: Left;   TOTAL KNEE ARTHROPLASTY Right 04/14/2022   Procedure: RIGHT TOTAL KNEE ARTHROPLASTY;  Surgeon: Mcarthur Rossetti, MD;  Location: [facility];  Service: Orthopedics;  Laterality: Right;   Patient Active Problem List   Diagnosis Date Noted  Status post total right knee replacement 04/14/2022   Chest pain 10/09/2020   Nausea 10/09/2020   Hypertension    Type 2 diabetes mellitus (HCC)    GERD (gastroesophageal reflux disease)    Facet degeneration of lumbar region 12/18/2018   Chronic low back pain without sciatica 06/18/2017   Primary osteoarthritis of right knee 03/26/2017   Presence of left artificial knee joint 02/26/2017   Unilateral primary osteoarthritis, left knee 01/12/2017   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/12/2017   Renal mass 12/22/2015     REFERRING DIAG: U76.546 (ICD-10-CM) - Status post right knee replacement  THERAPY DIAG:  Stiffness of right knee, not elsewhere classified  Muscle weakness (generalized)  Localized edema  Acute pain of right knee  Other abnormalities of gait and mobility  Unsteadiness on feet  Rationale for Evaluation and Treatment Rehabilitation  ONSET DATE: 04/14/2022 right TKR   SUBJECTIVE:    SUBJECTIVE STATEMENT: She notices a lot of swelling in her knee, and also her foot and ankle that is worse at night and after sitting for a while. She did extra walking this weekend when visiting a friend in a nursing home and had cramps in her legs after, but reports that isn't unusual.  PERTINENT HISTORY: OA, left TKR 2018, left kidney CA with partial nephrectomy 2017, HTN, obesity, DM2, LBP with sciatica RLE   PAIN:  NPRS scale: 0/10 upon arrival, rose to a 2-3/10 since last appointment Pain location: Rt knee/lower leg Pain description: throbbing, stinging, burning Aggravating factors: increased activity.  Relieving factors: meds, ice, elevation   PRECAUTIONS: None   WEIGHT BEARING RESTRICTIONS No   FALLS:  Has patient fallen in last 6 months? No   LIVING ENVIRONMENT: Lives with: lives with their spouse and lives with their daughter Lives in: House/apartment Stairs: Yes: External: 2 steps; on left going up Has following equipment at home: Single point cane, Walker - 2 wheeled, and built in Civil engineer, contracting   OCCUPATION: retired   PLOF: Independent, Independent with household mobility without device, and Independent with community mobility without device   PATIENT GOALS  walk without device, water aerobics,      OBJECTIVE:    DIAGNOSTIC FINDINGS:  04/14/22 Status post right knee arthroplasty with surgical sutures about the anterior aspect of the knee. Subcutaneous emphysema as expected. No perihardware fracture.   PATIENT SURVEYS:  04/19/2022 FOTO   43% with target 63% 05/10/2022  FOTO   55%   COGNITION: 04/19/2022 Overall cognitive status: Within functional limits for tasks assessed                          SENSATION: 04/19/2022 Light touch: WFL   EDEMA:  04/19/2022 RLE:  above knee 61.3cm around knee  55cm  below knee 43.4cm LLE:  above knee 56.0 cm  around knee 45cm below knee  41cm   POSTURE: 6/28/203:  rounded shoulders, forward head, flexed trunk , and weight shift left   PALPATION: 6/28/203:  tenderness all areas of right knee, redness & warmth laterally and medially.    LOWER EXTREMITY ROM:   ROM P:passive  A:active Right 04/19/2022 Left 04/19/2022 Right 04/26/2022 Right  05/03/2022 Right 05/10/2022  Hip flexion           Hip extension           Hip abduction           Hip adduction  Hip internal rotation           Hip external rotation           Knee flexion Supine head propped up P: 47* Seated A: 42*   Supine AROM heel slide 68 deg Seated P: 77* A: 70* Seated P: 95* A: 90* after manual  Knee extension Supine P: -8* A: -30* SAQ     Seated LAQ A: -19* Supine P: -9* A:-15* After manual  Ankle dorsiflexion           Ankle plantarflexion           Ankle inversion           Ankle eversion            (Blank rows = not tested)   LOWER EXTREMITY MMT:   MMT Right 04/19/2022 Left 04/19/2022  Hip flexion      Hip extension      Hip abduction      Hip adduction      Hip internal rotation      Hip external rotation      Knee flexion 2-/5    Knee extension 2-/5    Ankle dorsiflexion      Ankle plantarflexion      Ankle inversion      Ankle eversion       (Blank rows = not tested)   BED MOBILITY:   04/19/2022  Patient requires total manual assist for RLE for sit to supine & supine to sit.   05/05/2022  Patient moves RLE independently for sit to supine & supine to sit, and requires min assistance for raising RLE to elevated heights   GAIT: 05/10/2022:  pt amb in & out of clinic with [facility] safely >250' 05/05/2022:  Household  distance SPC use in [place] [facility] within clinic c SBA to supervision.  < 150 ft   04/28/2022:  FWW ambulation continued in clinic   6/28/203:  Distance walked: 50' Assistive device utilized: Environmental consultant - 2 wheeled Level of assistance: SBA Comments: antalgic gait with decreased stance RLE, right knee flexed in stance with minimal to no increase flexion for swing.      TODAY'S TREATMENT: 05/15/2022 Therex:             SciFit bike seat 11 BUEs / BLEs 8 mins fwd slow turning Leg press double leg 81 lbs 2 x 10, Rt leg 37 lbs 2 x 10 Seated towel roll heel prop knee extension stretch with weight 5# RLE 2 x 2 min hold with quad set x 10 reps, PT education on modification for weight at home and tactile cueing for quad set & leg press to increase knee ext Seated straight leg raise x 5 reps Supine straight leg raise x 5 reps 2 sets, required towel roll under heel to facilitate knee extension and tactile cueing for quad activation Supine modified thomas hip flexor stretch 2 x 30 sec Updated HEP handout with verbal and visual demo, pt verbalized understanding following reps in-clinic and received handout  Self Care:  PT recommendation for water consumption with soda and education on signs of dehydration  Vasopneumatic             Supine head elevated 10 mins 34 deg medium compression c R leg in elevation  05/12/2022 Therex:             SciFit bike seat 12 BUEs / BLEs rocking back / forth 1.5 mins, backwards cycling 1 min, 3 mins  fwd slow turning Leg press double leg 81 lbs 2 x 10 , Rt leg 37 lbs 2 x 10 Seated Rt knee flexion c Lt leg overpressure 10 sec x 6 (cues for use) Seated SLR x 10 on Rt  Neuro Re-ed  [employer] pew anterior/posterior ankle strategy on foam 2 mins c SBA occasional min A to prevent loss of balance  Feet together as close as she could eyes open 30 sec, eyes closed 45 seconds  Standing on foam bilateral finger tip assist c SBA hip extension x 10 bilateral  On foam- modified tandem stance c  SBA 30 sec x 1 bilateral  Manual Therapy  Seated Rt knee flexion c distraction/IR mobilization c movement.  Contract relax techniques for Rt knee flexion.   Vasopneumatic             Supine head elevated 10 mins 34 deg medium compression c R leg in elevation  05/10/2022 Therex:             SciFit bike seat 11 BUEs / BLEs rocking back / forth 1 mins, backwards cycling 2 min, forwards cycling 5 min level 1 Seated leg extension BLE concentric RLE eccentric 5# 2 x 10 reps Seated hamstring curl BLE 20# x 15 reps  Manual Therapy  Seated knee flexion PROM with overpressure to tolerance  Supine knee extension with towel roll prop grade 1 & 2 mobilizations  Vasopneumatic             Supine head elevated 10 mins 34 deg medium compression c R leg in elevation     PATIENT EDUCATION:  Education details: Access Code: [place] educated: Patient Education method: Explanation, Demonstration, Tactile cues, Verbal cues, and Handouts Education comprehension: verbalized understanding, returned demonstration, verbal cues required, tactile cues required, and needs further education     HOME EXERCISE PROGRAM: Access Code: [place] URL: [email or web address] Date: 05/15/2022 Prepared by: Jamey Reas  Exercises - Seated Knee Flexion Extension AROM   - 2-4 x daily - 7 x weekly - 2 sets - 10 reps - 5 seconds hold - Seated Hamstring Stretch with Strap  - 2-4 x daily - 7 x weekly - 1 sets - 2-3 reps - 30 seconds hold - Seated Quad Set  - 2-4 x daily - 7 x weekly - 2 sets - 10 reps - 5 seconds hold - Supine Short Arc Quad  - 2-4 x daily - 7 x weekly - 2 sets - 10 reps - 5 seconds hold - Supine Heel Slide with Strap  - 2-4 x daily - 7 x weekly - 2 sets - 10 reps - 5 seconds hold - Quad Setting and Stretching  - 2-4 x daily - 7 x weekly - 5-10 sets - 10 reps - prop 5-10 minutes & quad set5 seconds hold - Seated Passive Knee Extension with Weight  - 3 x daily - 7 x weekly - 1 sets - 1  reps - 5 min hold - seated single straight leg raise   - 3-5 x daily - 7 x weekly - 2-3 sets - 10 reps - 5 seconds hold - Supine Straight Leg Raises  - 3-5 x daily - 7 x weekly - 4 sets - 5 reps - 3 seconds hold - Modified Thomas Stretch  - 2-3 x daily - 7 x weekly - 1 sets - 3 reps - 30 seconds hold   ASSESSMENT:   CLINICAL IMPRESSION: She continues to be limited by quad strength and flexion/extension  range of motion, and hip flexor and hamstring tightness further limit available knee extension range of motion in supine and long-sitting. She continues to benefit from skilled PT to address range of motion and strength limitations required for functional activities and mobility.   OBJECTIVE IMPAIRMENTS Abnormal gait, decreased activity tolerance, decreased balance, decreased endurance, decreased knowledge of use of DME, decreased mobility, difficulty walking, decreased ROM, decreased strength, increased edema, impaired flexibility, postural dysfunction, obesity, and pain.    ACTIVITY LIMITATIONS carrying, lifting, bending, sitting, standing, squatting, sleeping, stairs, transfers, bed mobility, and locomotion level   PARTICIPATION LIMITATIONS: meal prep, cleaning, driving, and community activity   PERSONAL FACTORS Age, Fitness, Past/current experiences, and 3+ comorbidities: see PMH  are also affecting patient's functional outcome.    REHAB POTENTIAL: Good   CLINICAL DECISION MAKING: Stable/uncomplicated   EVALUATION COMPLEXITY: Low     GOALS: Goals reviewed with patient? Yes   SHORT TERM GOALS: Target date: 05/19/2022   Patient independent and verbalizes compliance with initial HEP Baseline: SEE OBJECTIVE DATA Goal status: met 05/10/22   2.  Patient reports 50% improvement in right knee pain. Baseline: SEE OBJECTIVE DATA Goal status: met 05/10/22   3.  PROM right knee extension -3* to flexion 90* Baseline: SEE OBJECTIVE DATA Goal status: partially met - 05/10/2022   LONG TERM  GOALS: Target date: 07/07/2022   Patient will improve FOTO score to 63% Baseline: SEE OBJECTIVE DATA Goal status: on going - assessed 05/12/2022   2.  Patient reports right knee pain </= 2/10 with standing & gait activities.  Baseline: SEE OBJECTIVE DATA Goal status: on going - assessed 05/12/2022   3.  Right Knee PROM 0* extension to 100* flexion Baseline: SEE OBJECTIVE DATA Goal status: on going - assessed 05/12/2022   4.  Right Knee AROM seated -3* extension to 90* flexion Baseline: SEE OBJECTIVE DATA Goal status: on going - assessed 05/12/2022   5.  Patient ambulates >300' community distances including negotiating ramps, curbs & stairs with LRAD independently. Baseline: SEE OBJECTIVE DATA Goal status: on going - assessed 05/12/2022     PLAN: PT FREQUENCY: 2-3 x/wk   PT DURATION: 12 weeks   PLANNED INTERVENTIONS: Therapeutic exercises, Therapeutic activity, Neuromuscular re-education, Balance training, Gait training, Patient/Family education, Joint mobilization, Stair training, DME instructions, Aquatic Therapy, Electrical stimulation, Cryotherapy, Moist heat, scar mobilization, Taping, Vasopneumatic device, Manual therapy, and physical performance testing   PLAN FOR NEXT SESSION:  check ROM,  End range mobility gains knee flex and ext, progressive strengthening in closed and open chain, check HEP  Jana Hakim, SPT 05/15/22, 3:45 PM  This entire session of physical therapy was performed under the direct supervision of PT signing evaluation /treatment. PT reviewed note and agrees.  Jamey Reas, PT, DPT 05/15/22  3:43 PM

## 2022-05-17 ENCOUNTER — Ambulatory Visit (INDEPENDENT_AMBULATORY_CARE_PROVIDER_SITE_OTHER): Payer: Medicare (Managed Care) | Admitting: Physical Therapy

## 2022-05-17 ENCOUNTER — Encounter: Payer: Self-pay | Admitting: Physical Therapy

## 2022-05-17 DIAGNOSIS — M25661 Stiffness of right knee, not elsewhere classified: Secondary | ICD-10-CM | POA: Diagnosis not present

## 2022-05-17 DIAGNOSIS — R600 Localized edema: Secondary | ICD-10-CM

## 2022-05-17 DIAGNOSIS — M6281 Muscle weakness (generalized): Secondary | ICD-10-CM | POA: Diagnosis not present

## 2022-05-17 DIAGNOSIS — R2681 Unsteadiness on feet: Secondary | ICD-10-CM

## 2022-05-17 DIAGNOSIS — M25561 Pain in right knee: Secondary | ICD-10-CM | POA: Diagnosis not present

## 2022-05-17 DIAGNOSIS — R2689 Other abnormalities of gait and mobility: Secondary | ICD-10-CM

## 2022-05-17 NOTE — Therapy (Signed)
OUTPATIENT PHYSICAL THERAPY TREATMENT NOTE   Patient Name: Debbie Velasquez MRN: [id number] DOB:[birth year], 71 y.o., female Today's Date: 05/17/2022  PCP: Nolene Ebbs, MD REFERRING PROVIDER: Mcarthur Rossetti, MD  END OF SESSION:   PT End of Session - 05/17/22 1432     Visit Number 13    Number of Visits 28    Date for PT Re-Evaluation 07/07/22    Authorization Type [employer]    Progress Note Due on Visit 20    PT Start Time 1429    PT Stop Time 1525    PT Time Calculation (min) 56 min    Activity Tolerance Patient tolerated treatment well;Patient limited by pain;Patient limited by fatigue    Behavior During Therapy [facility] for tasks assessed/performed              Past Medical History:  Diagnosis Date   Anemia    Arthritis    Cancer of left kidney (HCC)    Left renal mass   GERD (gastroesophageal reflux disease)    History of kidney stones    x3 -remains with one on right.    Hx of seasonal allergies    rare flare ups   Hypertension    Left renal mass    Obesity    Pneumonia    hx of 20 years ago   Right ureteral stone    Type 2 diabetes mellitus ([place])    Type 2   Wears glasses    Wears partial dentures    upper   Past Surgical History:  Procedure Laterality Date   CESAREAN SECTION  yrs ago   CYSTOSCOPY WITH RETROGRADE PYELOGRAM, URETEROSCOPY AND STENT PLACEMENT Left 05/24/2018   Procedure: CYSTOSCOPY WITH RETROGRADE PYELOGRAM, URETEROSCOPY AND STENT PLACEMENT;  Surgeon: Alexis Frock, MD;  Location: [facility];  Service: Urology;  Laterality: Left;   CYSTOSCOPY WITH RETROGRADE PYELOGRAM, URETEROSCOPY AND STENT PLACEMENT Left 02/27/2020   Procedure: CYSTOSCOPY WITH RETROGRADE PYELOGRAM, URETEROSCOPY AND STENT PLACEMENT;  Surgeon: Alexis Frock, MD;  Location: [facility];  Service: Urology;  Laterality: Left;  1 HR   CYSTOSCOPY WITH RETROGRADE PYELOGRAM, URETEROSCOPY AND STENT PLACEMENT Bilateral 10/20/2020   Procedure: CYSTOSCOPY WITH RETROGRADE  PYELOGRAM, URETEROSCOPY AND STENT PLACEMENT;  Surgeon: Alexis Frock, MD;  Location: [facility];  Service: Urology;  Laterality: Bilateral;  75 MINS   HOLMIUM LASER APPLICATION Left 11/29/7822   Procedure: HOLMIUM LASER APPLICATION;  Surgeon: Alexis Frock, MD;  Location: [facility];  Service: Urology;  Laterality: Left;   HOLMIUM LASER APPLICATION Left 11/26/5359   Procedure: HOLMIUM LASER APPLICATION;  Surgeon: Alexis Frock, MD;  Location: [facility];  Service: Urology;  Laterality: Left;   HOLMIUM LASER APPLICATION Bilateral 44/31/5400   Procedure: HOLMIUM LASER APPLICATION;  Surgeon: Alexis Frock, MD;  Location: [facility];  Service: Urology;  Laterality: Bilateral;   ROBOTIC ASSITED PARTIAL NEPHRECTOMY Left 12/22/2015   Procedure: XI ROBOTIC ASSITED PARTIAL NEPHRECTOMY;  Surgeon: Alexis Frock, MD;  Location: [facility];  Service: Urology;  Laterality: Left;   TOTAL ABDOMINAL HYSTERECTOMY W/ BILATERAL SALPINGOOPHORECTOMY  1980's   TOTAL KNEE ARTHROPLASTY Left 01/12/2017   Procedure: LEFT TOTAL KNEE ARTHROPLASTY and Right knee steroid injection;  Surgeon: Mcarthur Rossetti, MD;  Location: [facility];  Service: Orthopedics;  Laterality: Left;   TOTAL KNEE ARTHROPLASTY Right 04/14/2022   Procedure: RIGHT TOTAL KNEE ARTHROPLASTY;  Surgeon: Mcarthur Rossetti, MD;  Location: [facility];  Service: Orthopedics;  Laterality: Right;   Patient Active Problem List   Diagnosis Date  Noted   Status post total right knee replacement 04/14/2022   Chest pain 10/09/2020   Nausea 10/09/2020   Hypertension    Type 2 diabetes mellitus (HCC)    GERD (gastroesophageal reflux disease)    Facet degeneration of lumbar region 12/18/2018   Chronic low back pain without sciatica 06/18/2017   Primary osteoarthritis of right knee 03/26/2017   Presence of left artificial knee joint 02/26/2017   Unilateral primary osteoarthritis, left knee 01/12/2017   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/12/2017   Renal mass 12/22/2015     REFERRING DIAG: F75.102 (ICD-10-CM) - Status post right knee replacement  THERAPY DIAG:  Stiffness of right knee, not elsewhere classified  Muscle weakness (generalized)  Localized edema  Acute pain of right knee  Other abnormalities of gait and mobility  Unsteadiness on feet  Rationale for Evaluation and Treatment Rehabilitation  ONSET DATE: 04/14/2022 right TKR   SUBJECTIVE:    SUBJECTIVE STATEMENT: She had intense gluteal/hip soreness since the last session with the new iliopsoas thomas stretch and used the RW yesterday to get around. She felt better upon waking up and took a pain pill prior to PT session.  PERTINENT HISTORY: OA, left TKR 2018, left kidney CA with partial nephrectomy 2017, HTN, obesity, DM2, LBP with sciatica RLE   PAIN:  NPRS scale: 0/10 upon arrival, rose to a 2-3/10 since last appointment Pain location: Rt knee/lower leg Pain description: throbbing, stinging, burning Aggravating factors: increased activity.  Relieving factors: meds, ice, elevation   PRECAUTIONS: None   WEIGHT BEARING RESTRICTIONS No   FALLS:  Has patient fallen in last 6 months? No   LIVING ENVIRONMENT: Lives with: lives with their spouse and lives with their daughter Lives in: House/apartment Stairs: Yes: External: 2 steps; on left going up Has following equipment at home: Single point cane, Walker - 2 wheeled, and built in Civil engineer, contracting   OCCUPATION: retired   PLOF: Independent, Independent with household mobility without device, and Independent with community mobility without device   PATIENT GOALS  walk without device, water aerobics,      OBJECTIVE:    DIAGNOSTIC FINDINGS:  04/14/22 Status post right knee arthroplasty with surgical sutures about the anterior aspect of the knee. Subcutaneous emphysema as expected. No perihardware fracture.   PATIENT SURVEYS:  04/19/2022 FOTO   43% with target 63% 05/10/2022 FOTO   55%   COGNITION: 04/19/2022 Overall cognitive  status: Within functional limits for tasks assessed                          SENSATION: 04/19/2022 Light touch: WFL   EDEMA:  04/19/2022 RLE:  above knee 61.3cm around knee  55cm  below knee 43.4cm LLE:  above knee 56.0 cm  around knee 45cm below knee  41cm   POSTURE: 6/28/203:  rounded shoulders, forward head, flexed trunk , and weight shift left   PALPATION: 6/28/203:  tenderness all areas of right knee, redness & warmth laterally and medially.    LOWER EXTREMITY ROM:   ROM P:passive  A:active Right 04/19/2022 Left 04/19/2022 Right 04/26/2022 Right  05/03/2022 Right 05/10/2022 Right 05/17/2022  Hip flexion            Hip extension            Hip abduction            Hip adduction            Hip internal rotation  Hip external rotation            Knee flexion Supine head propped up P: 47* Seated A: 42*   Supine AROM heel slide 68 deg Seated P: 77* A: 70* Seated P: 95* A: 90* after manual Seated P: 93* A: 90* After manual  Knee extension Supine P: -8* A: -30* SAQ     Seated LAQ A: -19* Supine P: -9* A:-15* After manual   Ankle dorsiflexion            Ankle plantarflexion            Ankle inversion            Ankle eversion             (Blank rows = not tested)   LOWER EXTREMITY MMT:   MMT Right 04/19/2022 Left 04/19/2022  Hip flexion      Hip extension      Hip abduction      Hip adduction      Hip internal rotation      Hip external rotation      Knee flexion 2-/5    Knee extension 2-/5    Ankle dorsiflexion      Ankle plantarflexion      Ankle inversion      Ankle eversion       (Blank rows = not tested)   BED MOBILITY:   04/19/2022  Patient requires total manual assist for RLE for sit to supine & supine to sit.   05/05/2022  Patient moves RLE independently for sit to supine & supine to sit, and requires min assistance for raising RLE to elevated heights   GAIT: 05/10/2022:  pt amb in & out of clinic with [facility] safely >250' 05/05/2022:   Household distance SPC use in [place] UE within clinic c SBA to supervision.  < 150 ft   04/28/2022:  FWW ambulation continued in clinic   6/28/203:  Distance walked: 50' Assistive device utilized: Environmental consultant - 2 wheeled Level of assistance: SBA Comments: antalgic gait with decreased stance RLE, right knee flexed in stance with minimal to no increase flexion for swing.      TODAY'S TREATMENT: 05/17/2022 Therex:             SciFit bike seat 11 BUEs / BLEs 8 mins fwd slow turning Standing counter hip flexion stretch for upright posture at neutral x 2 minutes. Pt verbalized understanding & this stretch did not seem to bother her hip.   Neuro Re-Ed:  Foam beam // bars alternating standing marching x8 bil. fingertip UE support  Foam beam // bars church pew anterior/posterior ankle strategy x 1 min no UE support  Foam beam // bars modified tandem stance no UEs 30s eyes open 30s eyes closed, LLE posterior 2 minor losses of balance with UE support to recover and RLE posterior 3 losses of balance with unable to recover back to foam balance  Manual Therapy  Seated Rt knee flexion c distraction/IR mobilization c movement.  Contract relax techniques for Rt knee flexion.   Supine Rt hip flexion and rotation stretching with knee in pain-free range. Pt reported lateral and posterior Rt hip pain with external rotation and abduction, and no symptoms with straight hip flexion or internal rotation. PT educated on towel roll bolster under hip to reduce external rotation in order to calm sx, pt verbalized understanding  Vasopneumatic             Supine head elevated  10 mins 34 deg medium compression c R leg in elevation  05/15/2022 Therex:             SciFit bike seat 11 BUEs / BLEs 8 mins fwd slow turning Leg press double leg 81 lbs 2 x 10, Rt leg 37 lbs 2 x 10 Seated towel roll heel prop knee extension stretch with weight 5# RLE 2 x 2 min hold with quad set x 10 reps, PT education on modification for weight at home  and tactile cueing for quad set & leg press to increase knee ext Seated straight leg raise x 5 reps Supine straight leg raise x 5 reps 2 sets, required towel roll under heel to facilitate knee extension and tactile cueing for quad activation Supine modified thomas hip flexor stretch 2 x 30 sec Updated HEP handout with verbal and visual demo, pt verbalized understanding following reps in-clinic and received handout  Self Care:  PT recommendation for water consumption with soda and education on signs of dehydration  Vasopneumatic             Supine head elevated 10 mins 34 deg medium compression c R leg in elevation  05/12/2022 Therex:             SciFit bike seat 12 BUEs / BLEs rocking back / forth 1.5 mins, backwards cycling 1 min, 3 mins fwd slow turning Leg press double leg 81 lbs 2 x 10 , Rt leg 37 lbs 2 x 10 Seated Rt knee flexion c Lt leg overpressure 10 sec x 6 (cues for use) Seated SLR x 10 on Rt  Neuro Re-ed  Church pew anterior/posterior ankle strategy on foam 2 mins c SBA occasional min A to prevent loss of balance  Feet together as close as she could eyes open 30 sec, eyes closed 45 seconds  Standing on foam bilateral finger tip assist c SBA hip extension x 10 bilateral  On foam- modified tandem stance c SBA 30 sec x 1 bilateral  Manual Therapy  Seated Rt knee flexion c distraction/IR mobilization c movement.  Contract relax techniques for Rt knee flexion.   Vasopneumatic             Supine head elevated 10 mins 34 deg medium compression c R leg in elevation     PATIENT EDUCATION:  Education details: Access Code: [place] educated: Patient Education method: Explanation, Demonstration, Tactile cues, Verbal cues, and Handouts Education comprehension: verbalized understanding, returned demonstration, verbal cues required, tactile cues required, and needs further education     HOME EXERCISE PROGRAM: Access Code: [place] URL:  [email or web address] Date: 05/15/2022 Prepared by: Jamey Reas  Exercises - Seated Knee Flexion Extension AROM   - 2-4 x daily - 7 x weekly - 2 sets - 10 reps - 5 seconds hold - Seated Hamstring Stretch with Strap  - 2-4 x daily - 7 x weekly - 1 sets - 2-3 reps - 30 seconds hold - Seated Quad Set  - 2-4 x daily - 7 x weekly - 2 sets - 10 reps - 5 seconds hold - Supine Short Arc Quad  - 2-4 x daily - 7 x weekly - 2 sets - 10 reps - 5 seconds hold - Supine Heel Slide with Strap  - 2-4 x daily - 7 x weekly - 2 sets - 10 reps - 5 seconds hold - Quad Setting and Stretching  - 2-4 x daily - 7 x weekly - 5-10 sets - 10 reps -  prop 5-10 minutes & quad set5 seconds hold - Seated Passive Knee Extension with Weight  - 3 x daily - 7 x weekly - 1 sets - 1 reps - 5 min hold - seated single straight leg raise   - 3-5 x daily - 7 x weekly - 2-3 sets - 10 reps - 5 seconds hold - Supine Straight Leg Raises  - 3-5 x daily - 7 x weekly - 4 sets - 5 reps - 3 seconds hold - Modified Thomas Stretch  - 2-3 x daily - 7 x weekly - 1 sets - 3 reps - 30 seconds hold   ASSESSMENT:   CLINICAL IMPRESSION: She presented with intense lateral/posterior Rt hip pain and pins and needles sensation in the leg since last appointment brought on by iliopsoas thomas stretch, reporting need to use the walker and limited ability to move since then. Symptoms increase with hip external rotation and abduction at any degree of flexion, and was slightly relieved with a bolster under the lateral hip to restrict end range ER. Static balance activity on compliant surfaces is continuing to improve. Range of motion reassessment noted no significant change, possibly related to recent hip pain irritability and lack of exercising. She continues to benefit from skilled PT to address ROM, strength, balance, posture, and gait limitations.   OBJECTIVE IMPAIRMENTS Abnormal gait, decreased activity tolerance, decreased balance, decreased  endurance, decreased knowledge of use of DME, decreased mobility, difficulty walking, decreased ROM, decreased strength, increased edema, impaired flexibility, postural dysfunction, obesity, and pain.    ACTIVITY LIMITATIONS carrying, lifting, bending, sitting, standing, squatting, sleeping, stairs, transfers, bed mobility, and locomotion level   PARTICIPATION LIMITATIONS: meal prep, cleaning, driving, and community activity   PERSONAL FACTORS Age, Fitness, Past/current experiences, and 3+ comorbidities: see PMH  are also affecting patient's functional outcome.    REHAB POTENTIAL: Good   CLINICAL DECISION MAKING: Stable/uncomplicated   EVALUATION COMPLEXITY: Low     GOALS: Goals reviewed with patient? Yes   SHORT TERM GOALS: Target date: 05/19/2022   Patient independent and verbalizes compliance with initial HEP Baseline: SEE OBJECTIVE DATA Goal status: met 05/10/22   2.  Patient reports 50% improvement in right knee pain. Baseline: SEE OBJECTIVE DATA Goal status: met 05/10/22   3.  PROM right knee extension -3* to flexion 90* Baseline: SEE OBJECTIVE DATA Goal status: partially met - 05/17/2022   LONG TERM GOALS: Target date: 07/07/2022   Patient will improve FOTO score to 63% Baseline: SEE OBJECTIVE DATA Goal status: on going - assessed 05/12/2022   2.  Patient reports right knee pain </= 2/10 with standing & gait activities.  Baseline: SEE OBJECTIVE DATA Goal status: on going - assessed 05/12/2022   3.  Right Knee PROM 0* extension to 100* flexion Baseline: SEE OBJECTIVE DATA Goal status: on going - assessed 05/17/2022   4.  Right Knee AROM seated -3* extension to 90* flexion Baseline: SEE OBJECTIVE DATA Goal status: on going - assessed 05/17/2022   5.  Patient ambulates >300' community distances including negotiating ramps, curbs & stairs with LRAD independently. Baseline: SEE OBJECTIVE DATA Goal status: on going - assessed 05/12/2022     PLAN: PT FREQUENCY: 2-3  x/wk   PT DURATION: 12 weeks   PLANNED INTERVENTIONS: Therapeutic exercises, Therapeutic activity, Neuromuscular re-education, Balance training, Gait training, Patient/Family education, Joint mobilization, Stair training, DME instructions, Aquatic Therapy, Electrical stimulation, Cryotherapy, Moist heat, scar mobilization, Taping, Vasopneumatic device, Manual therapy, and physical performance testing   PLAN FOR  NEXT SESSION: assess hip irritability with ER and stretching/manual/modalities at hip for pain as indicated. End range mobility gains knee flex and ext, progressive strengthening   Jana Hakim, SPT 05/15/22, 3:45 PM  This entire session of physical therapy was performed under the direct supervision of PT signing evaluation /treatment. PT reviewed note and agrees.  Jamey Reas, PT, DPT 05/17/22  3:42 PM

## 2022-05-19 ENCOUNTER — Encounter: Payer: Self-pay | Admitting: Physical Therapy

## 2022-05-19 ENCOUNTER — Ambulatory Visit (INDEPENDENT_AMBULATORY_CARE_PROVIDER_SITE_OTHER): Payer: Medicare (Managed Care) | Admitting: Physical Therapy

## 2022-05-19 DIAGNOSIS — M25661 Stiffness of right knee, not elsewhere classified: Secondary | ICD-10-CM

## 2022-05-19 DIAGNOSIS — M25561 Pain in right knee: Secondary | ICD-10-CM

## 2022-05-19 DIAGNOSIS — M6281 Muscle weakness (generalized): Secondary | ICD-10-CM

## 2022-05-19 DIAGNOSIS — R2681 Unsteadiness on feet: Secondary | ICD-10-CM

## 2022-05-19 DIAGNOSIS — R2689 Other abnormalities of gait and mobility: Secondary | ICD-10-CM

## 2022-05-19 DIAGNOSIS — R600 Localized edema: Secondary | ICD-10-CM

## 2022-05-19 NOTE — Therapy (Signed)
OUTPATIENT PHYSICAL THERAPY TREATMENT NOTE   Patient Name: Debbie Velasquez MRN: [id number] DOB:09-16-1951, 71 y.o., female Today's Date: 05/19/2022  PCP: Nolene Ebbs, MD REFERRING PROVIDER: Mcarthur Rossetti, MD  END OF SESSION:   PT End of Session - 05/19/22 1511     Visit Number 14    Number of Visits 28    Date for PT Re-Evaluation 07/07/22    Authorization Type [employer]    Progress Note Due on Visit 50    PT Start Time 1431    PT Stop Time 1511    PT Time Calculation (min) 40 min    Activity Tolerance Patient tolerated treatment well    Behavior During Therapy WFL for tasks assessed/performed               Past Medical History:  Diagnosis Date   Anemia    Arthritis    Cancer of left kidney ([place])    Left renal mass   GERD (gastroesophageal reflux disease)    History of kidney stones    x3 -remains with one on right.    Hx of seasonal allergies    rare flare ups   Hypertension    Left renal mass    Obesity    Pneumonia    hx of 20 years ago   Right ureteral stone    Type 2 diabetes mellitus ([place])    Type 2   Wears glasses    Wears partial dentures    upper   Past Surgical History:  Procedure Laterality Date   CESAREAN SECTION  yrs ago   CYSTOSCOPY WITH RETROGRADE PYELOGRAM, URETEROSCOPY AND STENT PLACEMENT Left 05/24/2018   Procedure: CYSTOSCOPY WITH RETROGRADE PYELOGRAM, URETEROSCOPY AND STENT PLACEMENT;  Surgeon: Alexis Frock, MD;  Location: [facility];  Service: Urology;  Laterality: Left;   CYSTOSCOPY WITH RETROGRADE PYELOGRAM, URETEROSCOPY AND STENT PLACEMENT Left 02/27/2020   Procedure: CYSTOSCOPY WITH RETROGRADE PYELOGRAM, URETEROSCOPY AND STENT PLACEMENT;  Surgeon: Alexis Frock, MD;  Location: [facility];  Service: Urology;  Laterality: Left;  1 HR   CYSTOSCOPY WITH RETROGRADE PYELOGRAM, URETEROSCOPY AND STENT PLACEMENT Bilateral 10/20/2020   Procedure: CYSTOSCOPY WITH RETROGRADE PYELOGRAM, URETEROSCOPY AND STENT PLACEMENT;   Surgeon: Alexis Frock, MD;  Location: [facility];  Service: Urology;  Laterality: Bilateral;  75 MINS   HOLMIUM LASER APPLICATION Left 12/24/5454   Procedure: HOLMIUM LASER APPLICATION;  Surgeon: Alexis Frock, MD;  Location: [facility];  Service: Urology;  Laterality: Left;   HOLMIUM LASER APPLICATION Left 11/28/6387   Procedure: HOLMIUM LASER APPLICATION;  Surgeon: Alexis Frock, MD;  Location: [facility];  Service: Urology;  Laterality: Left;   HOLMIUM LASER APPLICATION Bilateral 37/34/2876   Procedure: HOLMIUM LASER APPLICATION;  Surgeon: Alexis Frock, MD;  Location: [facility];  Service: Urology;  Laterality: Bilateral;   ROBOTIC ASSITED PARTIAL NEPHRECTOMY Left 12/22/2015   Procedure: XI ROBOTIC ASSITED PARTIAL NEPHRECTOMY;  Surgeon: Alexis Frock, MD;  Location: [facility];  Service: Urology;  Laterality: Left;   TOTAL ABDOMINAL HYSTERECTOMY W/ BILATERAL SALPINGOOPHORECTOMY  1980's   TOTAL KNEE ARTHROPLASTY Left 01/12/2017   Procedure: LEFT TOTAL KNEE ARTHROPLASTY and Right knee steroid injection;  Surgeon: Mcarthur Rossetti, MD;  Location: [facility];  Service: Orthopedics;  Laterality: Left;   TOTAL KNEE ARTHROPLASTY Right 04/14/2022   Procedure: RIGHT TOTAL KNEE ARTHROPLASTY;  Surgeon: Mcarthur Rossetti, MD;  Location: [facility];  Service: Orthopedics;  Laterality: Right;   Patient Active Problem List   Diagnosis Date Noted   Status post  total right knee replacement 04/14/2022   Chest pain 10/09/2020   Nausea 10/09/2020   Hypertension    Type 2 diabetes mellitus ([facility])    GERD (gastroesophageal reflux disease)    Facet degeneration of lumbar region 12/18/2018   Chronic low back pain without sciatica 06/18/2017   Primary osteoarthritis of right knee 03/26/2017   Presence of left artificial knee joint 02/26/2017   Unilateral primary osteoarthritis, left knee 01/12/2017   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/12/2017   Renal mass 12/22/2015    REFERRING DIAG: Z96.651 (ICD-10-CM) -  Status post right knee replacement  THERAPY DIAG:  Stiffness of right knee, not elsewhere classified  Muscle weakness (generalized)  Localized edema  Acute pain of right knee  Other abnormalities of gait and mobility  Unsteadiness on feet  Rationale for Evaluation and Treatment Rehabilitation  ONSET DATE: 04/14/2022 right TKR   SUBJECTIVE:    SUBJECTIVE STATEMENT:  I'm feeling better today, able to use the cane instead of the RW. It feels like the more active I am the more I'm in pain at both the knee and the hip. The hip does not hurt, its sore- I'm not in excruciating pain, just uncomfortable and sore. My knee is very tender and I actually have a bruise on the front of my shin bone.   PERTINENT HISTORY: OA, left TKR 2018, left kidney CA with partial nephrectomy 2017, HTN, obesity, DM2, LBP with sciatica RLE   PAIN:  NPRS scale: 2/10 upon arrival, rose to a 2-3/10 since last appointment Pain location: Rt knee/lower leg Pain description: throbbing, stinging, burning Aggravating factors: increased activity.  Relieving factors: meds, ice, elevation   PRECAUTIONS: None   WEIGHT BEARING RESTRICTIONS No   FALLS:  Has patient fallen in last 6 months? No   LIVING ENVIRONMENT: Lives with: lives with their spouse and lives with their daughter Lives in: House/apartment Stairs: Yes: External: 2 steps; on left going up Has following equipment at home: Single point cane, Walker - 2 wheeled, and built in Civil engineer, contracting   OCCUPATION: retired   PLOF: Independent, Independent with household mobility without device, and Independent with community mobility without device   PATIENT GOALS  walk without device, water aerobics,      OBJECTIVE:    DIAGNOSTIC FINDINGS:  04/14/22 Status post right knee arthroplasty with surgical sutures about the anterior aspect of the knee. Subcutaneous emphysema as expected. No perihardware fracture.   PATIENT SURVEYS:  04/19/2022 FOTO   43% with  target 63% 05/10/2022 FOTO   55%   COGNITION: 04/19/2022 Overall cognitive status: Within functional limits for tasks assessed                          SENSATION: 04/19/2022 Light touch: WFL   EDEMA:  04/19/2022 RLE:  above knee 61.3cm around knee  55cm  below knee 43.4cm LLE:  above knee 56.0 cm  around knee 45cm below knee  41cm   POSTURE: 6/28/203:  rounded shoulders, forward head, flexed trunk , and weight shift left   PALPATION: 6/28/203:  tenderness all areas of right knee, redness & warmth laterally and medially.    LOWER EXTREMITY ROM:   ROM P:passive  A:active Right 04/19/2022 Left 04/19/2022 Right 04/26/2022 Right  05/03/2022 Right 05/10/2022 Right 05/17/2022  Hip flexion            Hip extension            Hip abduction  Hip adduction            Hip internal rotation            Hip external rotation            Knee flexion Supine head propped up P: 47* Seated A: 42*   Supine AROM heel slide 68 deg Seated P: 77* A: 70* Seated P: 95* A: 90* after manual Seated P: 93* A: 90* After manual  Knee extension Supine P: -8* A: -30* SAQ     Seated LAQ A: -19* Supine P: -9* A:-15* After manual   Ankle dorsiflexion            Ankle plantarflexion            Ankle inversion            Ankle eversion             (Blank rows = not tested)   LOWER EXTREMITY MMT:   MMT Right 04/19/2022 Left 04/19/2022  Hip flexion      Hip extension      Hip abduction      Hip adduction      Hip internal rotation      Hip external rotation      Knee flexion 2-/5    Knee extension 2-/5    Ankle dorsiflexion      Ankle plantarflexion      Ankle inversion      Ankle eversion       (Blank rows = not tested)   BED MOBILITY:   04/19/2022  Patient requires total manual assist for RLE for sit to supine & supine to sit.   05/05/2022  Patient moves RLE independently for sit to supine & supine to sit, and requires min assistance for raising RLE to elevated heights    GAIT: 05/10/2022:  pt amb in & out of clinic with [facility] safely >250' 05/05/2022:  Household distance SPC use in [place] UE within clinic c SBA to supervision.  < 150 ft   04/28/2022:  FWW ambulation continued in clinic   6/28/203:  Distance walked: 50' Assistive device utilized: Environmental consultant - 2 wheeled Level of assistance: SBA Comments: antalgic gait with decreased stance RLE, right knee flexed in stance with minimal to no increase flexion for swing.      TODAY'S TREATMENT:  05/19/22  Precor bike seat 7 partial rotations x6 minutes  MFR R TFL x3 rounds/3 different sites good release of mm tension noted   Knee self-flexion stretch on 8 inch step 10x10 second holds  TKEs in standing with red TB 1x15 3 second holds  LAQs red TB 1x10 3 second holds Contact relax techniques prior to knee flexion overpressure in sitting   05/17/2022 Therex:             SciFit bike seat 11 BUEs / BLEs 8 mins fwd slow turning Standing counter hip flexion stretch for upright posture at neutral x 2 minutes. Pt verbalized understanding & this stretch did not seem to bother her hip.   Neuro Re-Ed:  Foam beam // bars alternating standing marching x8 bil. fingertip UE support  Foam beam // bars church pew anterior/posterior ankle strategy x 1 min no UE support  Foam beam // bars modified tandem stance no UEs 30s eyes open 30s eyes closed, LLE posterior 2 minor losses of balance with UE support to recover and RLE posterior 3 losses of balance with unable to recover back to foam balance  Manual Therapy  Seated Rt knee flexion c distraction/IR mobilization c movement.  Contract relax techniques for Rt knee flexion.   Supine Rt hip flexion and rotation stretching with knee in pain-free range. Pt reported lateral and posterior Rt hip pain with external rotation and abduction, and no symptoms with straight hip flexion or internal rotation. PT educated on towel roll bolster under hip to reduce external rotation in order to calm sx,  pt verbalized understanding  Vasopneumatic             Supine head elevated 10 mins 34 deg medium compression c R leg in elevation  05/15/2022 Therex:             SciFit bike seat 11 BUEs / BLEs 8 mins fwd slow turning Leg press double leg 81 lbs 2 x 10, Rt leg 37 lbs 2 x 10 Seated towel roll heel prop knee extension stretch with weight 5# RLE 2 x 2 min hold with quad set x 10 reps, PT education on modification for weight at home and tactile cueing for quad set & leg press to increase knee ext Seated straight leg raise x 5 reps Supine straight leg raise x 5 reps 2 sets, required towel roll under heel to facilitate knee extension and tactile cueing for quad activation Supine modified thomas hip flexor stretch 2 x 30 sec Updated HEP handout with verbal and visual demo, pt verbalized understanding following reps in-clinic and received handout  Self Care:  PT recommendation for water consumption with soda and education on signs of dehydration  Vasopneumatic             Supine head elevated 10 mins 34 deg medium compression c R leg in elevation  05/12/2022 Therex:             SciFit bike seat 12 BUEs / BLEs rocking back / forth 1.5 mins, backwards cycling 1 min, 3 mins fwd slow turning Leg press double leg 81 lbs 2 x 10 , Rt leg 37 lbs 2 x 10 Seated Rt knee flexion c Lt leg overpressure 10 sec x 6 (cues for use) Seated SLR x 10 on Rt  Neuro Re-ed  [employer] pew anterior/posterior ankle strategy on foam 2 mins c SBA occasional min A to prevent loss of balance  Feet together as close as she could eyes open 30 sec, eyes closed 45 seconds  Standing on foam bilateral finger tip assist c SBA hip extension x 10 bilateral  On foam- modified tandem stance c SBA 30 sec x 1 bilateral  Manual Therapy  Seated Rt knee flexion c distraction/IR mobilization c movement.  Contract relax techniques for Rt knee flexion.   Vasopneumatic             Supine head elevated 10 mins 34 deg medium compression c R  leg in elevation     PATIENT EDUCATION:  Education details: anatomy/cause of and role of trigger points in pain, gait compensations, benefits of manual, general course of progression after surgery  Person educated: Patient Education method: Explanation, Demonstration, Tactile cues, Verbal cues, and Handouts Education comprehension: verbalized understanding, returned demonstration, verbal cues required, tactile cues required, and needs further education     HOME EXERCISE PROGRAM: Access Code: [place] URL: [email or web address] Date: 05/15/2022 Prepared by: Jamey Reas  Exercises - Seated Knee Flexion Extension AROM   - 2-4 x daily - 7 x weekly - 2 sets - 10 reps - 5 seconds hold - Seated Hamstring Stretch with Strap  -  2-4 x daily - 7 x weekly - 1 sets - 2-3 reps - 30 seconds hold - Seated Quad Set  - 2-4 x daily - 7 x weekly - 2 sets - 10 reps - 5 seconds hold - Supine Short Arc Quad  - 2-4 x daily - 7 x weekly - 2 sets - 10 reps - 5 seconds hold - Supine Heel Slide with Strap  - 2-4 x daily - 7 x weekly - 2 sets - 10 reps - 5 seconds hold - Quad Setting and Stretching  - 2-4 x daily - 7 x weekly - 5-10 sets - 10 reps - prop 5-10 minutes & quad set5 seconds hold - Seated Passive Knee Extension with Weight  - 3 x daily - 7 x weekly - 1 sets - 1 reps - 5 min hold - seated single straight leg raise   - 3-5 x daily - 7 x weekly - 2-3 sets - 10 reps - 5 seconds hold - Supine Straight Leg Raises  - 3-5 x daily - 7 x weekly - 4 sets - 5 reps - 3 seconds hold - Modified Thomas Stretch  - 2-3 x daily - 7 x weekly - 1 sets - 3 reps - 30 seconds hold   ASSESSMENT:   CLINICAL IMPRESSION:  Debbie Velasquez arrives feeling a bit better today, able to tolerate using SPC instead of RW. Worked on ROM on [employer] bike today, then tried some STM techniques to L hip as tolerated- found a lot of trigger points in R TFL with improved gait pattern noted after MFR to this area. Otherwise worked on  knee ROM and general mobility. She needed a lot of reassurance today, was a bit disappointed in her progress, lots of education on general course of recovery after TKA/gentle reminders she really is only about one month post op.    OBJECTIVE IMPAIRMENTS Abnormal gait, decreased activity tolerance, decreased balance, decreased endurance, decreased knowledge of use of DME, decreased mobility, difficulty walking, decreased ROM, decreased strength, increased edema, impaired flexibility, postural dysfunction, obesity, and pain.    ACTIVITY LIMITATIONS carrying, lifting, bending, sitting, standing, squatting, sleeping, stairs, transfers, bed mobility, and locomotion level   PARTICIPATION LIMITATIONS: meal prep, cleaning, driving, and community activity   PERSONAL FACTORS Age, Fitness, Past/current experiences, and 3+ comorbidities: see PMH  are also affecting patient's functional outcome.    REHAB POTENTIAL: Good   CLINICAL DECISION MAKING: Stable/uncomplicated   EVALUATION COMPLEXITY: Low     GOALS: Goals reviewed with patient? Yes   SHORT TERM GOALS: Target date: 05/19/2022   Patient independent and verbalizes compliance with initial HEP Baseline: SEE OBJECTIVE DATA Goal status: met 05/10/22   2.  Patient reports 50% improvement in right knee pain. Baseline: SEE OBJECTIVE DATA Goal status: met 05/10/22   3.  PROM right knee extension -3* to flexion 90* Baseline: SEE OBJECTIVE DATA Goal status: partially met - 05/17/2022   LONG TERM GOALS: Target date: 07/07/2022   Patient will improve FOTO score to 63% Baseline: SEE OBJECTIVE DATA Goal status: on going - assessed 05/12/2022   2.  Patient reports right knee pain </= 2/10 with standing & gait activities.  Baseline: SEE OBJECTIVE DATA Goal status: on going - assessed 05/12/2022   3.  Right Knee PROM 0* extension to 100* flexion Baseline: SEE OBJECTIVE DATA Goal status: on going - assessed 05/17/2022   4.  Right Knee AROM seated -3*  extension to 90* flexion Baseline: SEE OBJECTIVE DATA Goal status:  on going - assessed 05/17/2022   5.  Patient ambulates >300' community distances including negotiating ramps, curbs & stairs with LRAD independently. Baseline: SEE OBJECTIVE DATA Goal status: on going - assessed 05/12/2022     PLAN: PT FREQUENCY: 2-3 x/wk   PT DURATION: 12 weeks   PLANNED INTERVENTIONS: Therapeutic exercises, Therapeutic activity, Neuromuscular re-education, Balance training, Gait training, Patient/Family education, Joint mobilization, Stair training, DME instructions, Aquatic Therapy, Electrical stimulation, Cryotherapy, Moist heat, scar mobilization, Taping, Vasopneumatic device, Manual therapy, and physical performance testing   PLAN FOR NEXT SESSION: assess hip irritability with ER and stretching/manual/modalities at hip for pain as indicated. End range mobility gains knee flex and ext, progressive strengthening . Continue hip MFR if she is agreeable.    Ann Lions PT DPT PN2  05/19/2022, 3:13 PM

## 2022-05-22 ENCOUNTER — Encounter: Payer: Self-pay | Admitting: Physical Therapy

## 2022-05-22 ENCOUNTER — Ambulatory Visit (INDEPENDENT_AMBULATORY_CARE_PROVIDER_SITE_OTHER): Payer: Medicare (Managed Care) | Admitting: Physical Therapy

## 2022-05-22 DIAGNOSIS — M25661 Stiffness of right knee, not elsewhere classified: Secondary | ICD-10-CM | POA: Diagnosis not present

## 2022-05-22 DIAGNOSIS — M25561 Pain in right knee: Secondary | ICD-10-CM

## 2022-05-22 DIAGNOSIS — R600 Localized edema: Secondary | ICD-10-CM

## 2022-05-22 DIAGNOSIS — M6281 Muscle weakness (generalized): Secondary | ICD-10-CM

## 2022-05-22 DIAGNOSIS — R2681 Unsteadiness on feet: Secondary | ICD-10-CM

## 2022-05-22 DIAGNOSIS — R2689 Other abnormalities of gait and mobility: Secondary | ICD-10-CM

## 2022-05-22 NOTE — Therapy (Addendum)
OUTPATIENT PHYSICAL THERAPY TREATMENT NOTE   Patient Name: Debbie Velasquez MRN: [id number] DOB:11/10/1950, 71 y.o., female Today's Date: 05/22/2022  PCP: Nolene Ebbs, MD REFERRING PROVIDER: Mcarthur Rossetti, MD  END OF SESSION:   PT End of Session - 05/22/22 1526     Visit Number 15    Number of Visits 28    Date for PT Re-Evaluation 07/07/22    Authorization Type [employer]    Progress Note Due on Visit 4    PT Start Time 1430    PT Stop Time 1532    PT Time Calculation (min) 62 min    Activity Tolerance Patient tolerated treatment well    Behavior During Therapy WFL for tasks assessed/performed             Past Medical History:  Diagnosis Date   Anemia    Arthritis    Cancer of left kidney ([place])    Left renal mass   GERD (gastroesophageal reflux disease)    History of kidney stones    x3 -remains with one on right.    Hx of seasonal allergies    rare flare ups   Hypertension    Left renal mass    Obesity    Pneumonia    hx of 20 years ago   Right ureteral stone    Type 2 diabetes mellitus ([place])    Type 2   Wears glasses    Wears partial dentures    upper   Past Surgical History:  Procedure Laterality Date   CESAREAN SECTION  yrs ago   CYSTOSCOPY WITH RETROGRADE PYELOGRAM, URETEROSCOPY AND STENT PLACEMENT Left 05/24/2018   Procedure: CYSTOSCOPY WITH RETROGRADE PYELOGRAM, URETEROSCOPY AND STENT PLACEMENT;  Surgeon: Alexis Frock, MD;  Location: [facility];  Service: Urology;  Laterality: Left;   CYSTOSCOPY WITH RETROGRADE PYELOGRAM, URETEROSCOPY AND STENT PLACEMENT Left 02/27/2020   Procedure: CYSTOSCOPY WITH RETROGRADE PYELOGRAM, URETEROSCOPY AND STENT PLACEMENT;  Surgeon: Alexis Frock, MD;  Location: [facility];  Service: Urology;  Laterality: Left;  1 HR   CYSTOSCOPY WITH RETROGRADE PYELOGRAM, URETEROSCOPY AND STENT PLACEMENT Bilateral 10/20/2020   Procedure: CYSTOSCOPY WITH RETROGRADE PYELOGRAM, URETEROSCOPY AND STENT PLACEMENT;  Surgeon:  Alexis Frock, MD;  Location: [facility];  Service: Urology;  Laterality: Bilateral;  75 MINS   HOLMIUM LASER APPLICATION Left 12/28/3336   Procedure: HOLMIUM LASER APPLICATION;  Surgeon: Alexis Frock, MD;  Location: [facility];  Service: Urology;  Laterality: Left;   HOLMIUM LASER APPLICATION Left 12/22/9189   Procedure: HOLMIUM LASER APPLICATION;  Surgeon: Alexis Frock, MD;  Location: [facility];  Service: Urology;  Laterality: Left;   HOLMIUM LASER APPLICATION Bilateral 66/03/44   Procedure: HOLMIUM LASER APPLICATION;  Surgeon: Alexis Frock, MD;  Location: [facility];  Service: Urology;  Laterality: Bilateral;   ROBOTIC ASSITED PARTIAL NEPHRECTOMY Left 12/22/2015   Procedure: XI ROBOTIC ASSITED PARTIAL NEPHRECTOMY;  Surgeon: Alexis Frock, MD;  Location: [facility];  Service: Urology;  Laterality: Left;   TOTAL ABDOMINAL HYSTERECTOMY W/ BILATERAL SALPINGOOPHORECTOMY  1980's   TOTAL KNEE ARTHROPLASTY Left 01/12/2017   Procedure: LEFT TOTAL KNEE ARTHROPLASTY and Right knee steroid injection;  Surgeon: Mcarthur Rossetti, MD;  Location: [facility];  Service: Orthopedics;  Laterality: Left;   TOTAL KNEE ARTHROPLASTY Right 04/14/2022   Procedure: RIGHT TOTAL KNEE ARTHROPLASTY;  Surgeon: Mcarthur Rossetti, MD;  Location: [facility];  Service: Orthopedics;  Laterality: Right;   Patient Active Problem List   Diagnosis Date Noted   Status post total right  knee replacement 04/14/2022   Chest pain 10/09/2020   Nausea 10/09/2020   Hypertension    Type 2 diabetes mellitus ([facility])    GERD (gastroesophageal reflux disease)    Facet degeneration of lumbar region 12/18/2018   Chronic low back pain without sciatica 06/18/2017   Primary osteoarthritis of right knee 03/26/2017   Presence of left artificial knee joint 02/26/2017   Unilateral primary osteoarthritis, left knee 01/12/2017   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/12/2017   Renal mass 12/22/2015    REFERRING DIAG: Z96.651 (ICD-10-CM) - Status post  right knee replacement  THERAPY DIAG:  Stiffness of right knee, not elsewhere classified  Muscle weakness (generalized)  Localized edema  Acute pain of right knee  Other abnormalities of gait and mobility  Unsteadiness on feet  Rationale for Evaluation and Treatment Rehabilitation  ONSET DATE: 04/14/2022 right TKR   SUBJECTIVE:    SUBJECTIVE STATEMENT: She felt that last session didn't do much, and the MFR hurt. She walked a lot on Saturday and felt increased pain that evening that made sleeping difficult and she took medication to sleep. She still has stiffness and swelling through her knee, foot, and ankle with standing & gait activities.   PERTINENT HISTORY: OA, left TKR 2018, left kidney CA with partial nephrectomy 2017, HTN, obesity, DM2, LBP with sciatica RLE   PAIN:  NPRS scale: 0/10 upon arrival, rose to a 5/10 since last appointment Pain location: Rt knee/lower leg Pain description: throbbing, stinging, burning Aggravating factors: increased activity.  Relieving factors: meds, ice, elevation   PRECAUTIONS: None   WEIGHT BEARING RESTRICTIONS No   FALLS:  Has patient fallen in last 6 months? No   LIVING ENVIRONMENT: Lives with: lives with their spouse and lives with their daughter Lives in: House/apartment Stairs: Yes: External: 2 steps; on left going up Has following equipment at home: Single point cane, Walker - 2 wheeled, and built in Civil engineer, contracting   OCCUPATION: retired   PLOF: Independent, Independent with household mobility without device, and Independent with community mobility without device   PATIENT GOALS  walk without device, water aerobics,      OBJECTIVE:    DIAGNOSTIC FINDINGS:  04/14/22 Status post right knee arthroplasty with surgical sutures about the anterior aspect of the knee. Subcutaneous emphysema as expected. No perihardware fracture.   PATIENT SURVEYS:  04/19/2022 FOTO   43% with target 63% 05/10/2022 FOTO   55%    COGNITION: 04/19/2022 Overall cognitive status: Within functional limits for tasks assessed                          SENSATION: 04/19/2022 Light touch: WFL   EDEMA:  04/19/2022 RLE:  above knee 61.3cm around knee  55cm  below knee 43.4cm LLE:  above knee 56.0 cm  around knee 45cm below knee  41cm   POSTURE: 6/28/203:  rounded shoulders, forward head, flexed trunk , and weight shift left   PALPATION: 6/28/203:  tenderness all areas of right knee, redness & warmth laterally and medially.    LOWER EXTREMITY ROM:   ROM P:passive  A:active Right 04/19/2022 Left 04/19/2022 Right 04/26/2022 Right  05/03/2022 Right 05/10/2022 Right 05/17/2022  Hip flexion            Hip extension            Hip abduction            Hip adduction  Hip internal rotation            Hip external rotation            Knee flexion Supine head propped up P: 47* Seated A: 42*   Supine AROM heel slide 68 deg Seated P: 77* A: 70* Seated P: 95* A: 90* after manual Seated P: 93* A: 90* After manual  Knee extension Supine P: -8* A: -30* SAQ     Seated LAQ A: -19* Supine P: -9* A:-15* After manual   Ankle dorsiflexion            Ankle plantarflexion            Ankle inversion            Ankle eversion             (Blank rows = not tested)   LOWER EXTREMITY MMT:   MMT Right 04/19/2022 Left 04/19/2022  Hip flexion      Hip extension      Hip abduction      Hip adduction      Hip internal rotation      Hip external rotation      Knee flexion 2-/5    Knee extension 2-/5    Ankle dorsiflexion      Ankle plantarflexion      Ankle inversion      Ankle eversion       (Blank rows = not tested)   BED MOBILITY:   04/19/2022  Patient requires total manual assist for RLE for sit to supine & supine to sit.   05/05/2022  Patient moves RLE independently for sit to supine & supine to sit, and requires min assistance for raising RLE to elevated heights   GAIT: 05/10/2022:  pt amb in & out of  clinic with [facility] safely >250' 05/05/2022:  Household distance SPC use in [place] UE within clinic c SBA to supervision.  < 150 ft   04/28/2022:  FWW ambulation continued in clinic   6/28/203:  Distance walked: 50' Assistive device utilized: Environmental consultant - 2 wheeled Level of assistance: SBA Comments: antalgic gait with decreased stance RLE, right knee flexed in stance with minimal to no increase flexion for swing.      TODAY'S TREATMENT:  05/22/22 Therex:  Recumbent bike seat 6 partial rotations x 8 minutes  Seated LAQ with active knee flexion and contralateral knee motion x 10 reps  Manual:  Seated Rt knee flexion c distraction/IR mobilization c movement.  Contract relax techniques for Rt knee flexion - agonist and antagonist  IASTM to medial, anterior, lateral knee tendons and ligaments with knee in 70* flexion  Hip abduction PROM with 0* and 30* hip flexion to tolerance, pt reported lateral hip pain  Supine knee extension PROM to tolerance  PT demo and verbal explanation of scar mobilization techniques for mobility and desensitization, pt verbalized understanding  Vasopneumatic             Supine head elevated 10 mins 34 deg medium compression c R leg in elevation  05/19/22 Precor bike seat 7 partial rotations x6 minutes  MFR R TFL x3 rounds/3 different sites good release of mm tension noted   Knee self-flexion stretch on 8 inch step 10x10 second holds  TKEs in standing with red TB 1x15 3 second holds  LAQs red TB 1x10 3 second holds Contact relax techniques prior to knee flexion overpressure in sitting   05/17/2022 Therex:  SciFit bike seat 11 BUEs / BLEs 8 mins fwd slow turning Standing counter hip flexion stretch for upright posture at neutral x 2 minutes. Pt verbalized understanding & this stretch did not seem to bother her hip.   Neuro Re-Ed:  Foam beam // bars alternating standing marching x8 bil. fingertip UE support  Foam beam // bars church pew anterior/posterior  ankle strategy x 1 min no UE support  Foam beam // bars modified tandem stance no UEs 30s eyes open 30s eyes closed, LLE posterior 2 minor losses of balance with UE support to recover and RLE posterior 3 losses of balance with unable to recover back to foam balance  Manual Therapy  Seated Rt knee flexion c distraction/IR mobilization c movement.  Contract relax techniques for Rt knee flexion.   Supine Rt hip flexion and rotation stretching with knee in pain-free range. Pt reported lateral and posterior Rt hip pain with external rotation and abduction, and no symptoms with straight hip flexion or internal rotation. PT educated on towel roll bolster under hip to reduce external rotation in order to calm sx, pt verbalized understanding  Vasopneumatic             Supine head elevated 10 mins 34 deg medium compression c R leg in elevation  05/15/2022 Therex:             SciFit bike seat 11 BUEs / BLEs 8 mins fwd slow turning Leg press double leg 81 lbs 2 x 10, Rt leg 37 lbs 2 x 10 Seated towel roll heel prop knee extension stretch with weight 5# RLE 2 x 2 min hold with quad set x 10 reps, PT education on modification for weight at home and tactile cueing for quad set & leg press to increase knee ext Seated straight leg raise x 5 reps Supine straight leg raise x 5 reps 2 sets, required towel roll under heel to facilitate knee extension and tactile cueing for quad activation Supine modified thomas hip flexor stretch 2 x 30 sec Updated HEP handout with verbal and visual demo, pt verbalized understanding following reps in-clinic and received handout  Self Care:  PT recommendation for water consumption with soda and education on signs of dehydration  Vasopneumatic             Supine head elevated 10 mins 34 deg medium compression c R leg in elevation    PATIENT EDUCATION:  Education details: anatomy/cause of and role of trigger points in pain, gait compensations, benefits of manual, general course  of progression after surgery  Person educated: Patient Education method: Explanation, Demonstration, Tactile cues, Verbal cues, and Handouts Education comprehension: verbalized understanding, returned demonstration, verbal cues required, tactile cues required, and needs further education     HOME EXERCISE PROGRAM: Access Code: [email or web address] URL: [email or web address] Date: 05/15/2022 Prepared by: Jamey Reas  Exercises - Seated Knee Flexion Extension AROM   - 2-4 x daily - 7 x weekly - 2 sets - 10 reps - 5 seconds hold - Seated Hamstring Stretch with Strap  - 2-4 x daily - 7 x weekly - 1 sets - 2-3 reps - 30 seconds hold - Seated Quad Set  - 2-4 x daily - 7 x weekly - 2 sets - 10 reps - 5 seconds hold - Supine Short Arc Quad  - 2-4 x daily - 7 x weekly - 2 sets - 10 reps - 5 seconds hold - Supine Heel Slide with Strap  - 2-4 x daily -  7 x weekly - 2 sets - 10 reps - 5 seconds hold - Quad Setting and Stretching  - 2-4 x daily - 7 x weekly - 5-10 sets - 10 reps - prop 5-10 minutes & quad set5 seconds hold - Seated Passive Knee Extension with Weight  - 3 x daily - 7 x weekly - 1 sets - 1 reps - 5 min hold - seated single straight leg raise   - 3-5 x daily - 7 x weekly - 2-3 sets - 10 reps - 5 seconds hold - Supine Straight Leg Raises  - 3-5 x daily - 7 x weekly - 4 sets - 5 reps - 3 seconds hold - Modified Thomas Stretch  - 2-3 x daily - 7 x weekly - 1 sets - 3 reps - 30 seconds hold   ASSESSMENT:   CLINICAL IMPRESSION: She arrived feeling increased stiffness and noted a lot of pain from the lateral hip manual techniques in the last appointment. Her pain and tolerable range improved following manual techniques today, and she was instructed on scar mobilization techniques for scar mobility and desensitization with verbalized understanding and intent to purchase vitamin E oil to begin at home. She had notable hip flexor, adductor, and abductor tightness with active and passive leg  movement that increased hip and knee pain, and she will benefit from stretching across hip joint in order to improve mobility and symptoms. She continues to benefit from skilled PT.   OBJECTIVE IMPAIRMENTS Abnormal gait, decreased activity tolerance, decreased balance, decreased endurance, decreased knowledge of use of DME, decreased mobility, difficulty walking, decreased ROM, decreased strength, increased edema, impaired flexibility, postural dysfunction, obesity, and pain.    ACTIVITY LIMITATIONS carrying, lifting, bending, sitting, standing, squatting, sleeping, stairs, transfers, bed mobility, and locomotion level   PARTICIPATION LIMITATIONS: meal prep, cleaning, driving, and community activity   PERSONAL FACTORS Age, Fitness, Past/current experiences, and 3+ comorbidities: see PMH  are also affecting patient's functional outcome.    REHAB POTENTIAL: Good   CLINICAL DECISION MAKING: Stable/uncomplicated   EVALUATION COMPLEXITY: Low     GOALS: Goals reviewed with patient? Yes   SHORT TERM GOALS: Target date: 05/19/2022   Patient independent and verbalizes compliance with initial HEP Baseline: SEE OBJECTIVE DATA Goal status: met 05/10/22   2.  Patient reports 50% improvement in right knee pain. Baseline: SEE OBJECTIVE DATA Goal status: met 05/10/22   3.  PROM right knee extension -3* to flexion 90* Baseline: SEE OBJECTIVE DATA Goal status: partially met - 05/17/2022   LONG TERM GOALS: Target date: 07/07/2022   Patient will improve FOTO score to 63% Baseline: SEE OBJECTIVE DATA Goal status: on going - assessed 05/12/2022   2.  Patient reports right knee pain </= 2/10 with standing & gait activities.  Baseline: SEE OBJECTIVE DATA Goal status: on going - assessed 05/12/2022   3.  Right Knee PROM 0* extension to 100* flexion Baseline: SEE OBJECTIVE DATA Goal status: on going - assessed 05/17/2022   4.  Right Knee AROM seated -3* extension to 90* flexion Baseline: SEE OBJECTIVE  DATA Goal status: on going - assessed 05/17/2022   5.  Patient ambulates >300' community distances including negotiating ramps, curbs & stairs with LRAD independently. Baseline: SEE OBJECTIVE DATA Goal status: on going - assessed 05/12/2022     PLAN: PT FREQUENCY: 2-3 x/wk   PT DURATION: 12 weeks   PLANNED INTERVENTIONS: Therapeutic exercises, Therapeutic activity, Neuromuscular re-education, Balance training, Gait training, Patient/Family education, Joint mobilization, Stair training,  DME instructions, Aquatic Therapy, Electrical stimulation, Cryotherapy, Moist heat, scar mobilization, Taping, Vasopneumatic device, Manual therapy, and physical performance testing   PLAN FOR NEXT SESSION: Route note to MD next visit. Take ROM measurement. Emphasize stretching long muscles that cross knee and hip to tolerance. Continue progressive strengthening and end range mobility.  Jana Hakim, SPT 05/22/2022, 3:53 PM  This entire session of physical therapy was performed under the direct supervision of PT signing evaluation /treatment. PT reviewed note and agrees.  Jamey Reas, PT, DPT 05/22/2022 4:55 PM

## 2022-05-24 ENCOUNTER — Encounter: Payer: Self-pay | Admitting: Physical Therapy

## 2022-05-24 ENCOUNTER — Encounter: Payer: Self-pay | Admitting: Orthopaedic Surgery

## 2022-05-24 ENCOUNTER — Ambulatory Visit (INDEPENDENT_AMBULATORY_CARE_PROVIDER_SITE_OTHER): Payer: Medicare (Managed Care) | Admitting: Orthopaedic Surgery

## 2022-05-24 ENCOUNTER — Ambulatory Visit (INDEPENDENT_AMBULATORY_CARE_PROVIDER_SITE_OTHER): Payer: Medicare (Managed Care) | Admitting: Physical Therapy

## 2022-05-24 DIAGNOSIS — M6281 Muscle weakness (generalized): Secondary | ICD-10-CM

## 2022-05-24 DIAGNOSIS — Z96651 Presence of right artificial knee joint: Secondary | ICD-10-CM

## 2022-05-24 DIAGNOSIS — R2681 Unsteadiness on feet: Secondary | ICD-10-CM

## 2022-05-24 DIAGNOSIS — M25561 Pain in right knee: Secondary | ICD-10-CM | POA: Diagnosis not present

## 2022-05-24 DIAGNOSIS — M25661 Stiffness of right knee, not elsewhere classified: Secondary | ICD-10-CM

## 2022-05-24 DIAGNOSIS — R2689 Other abnormalities of gait and mobility: Secondary | ICD-10-CM

## 2022-05-24 DIAGNOSIS — R600 Localized edema: Secondary | ICD-10-CM

## 2022-05-24 MED ORDER — OXYCODONE HCL 5 MG PO TABS: 5.0000 mg | ORAL_TABLET | Freq: Four times a day (QID) | ORAL | 0 refills | Status: AC | PRN

## 2022-05-24 NOTE — Therapy (Signed)
OUTPATIENT PHYSICAL THERAPY TREATMENT NOTE & PROGRESS NOTE   Patient Name: Debbie Velasquez MRN: [id number] DOB:06-Aug-1951, 71 y.o., female Today's Date: 05/24/2022  PCP: Nolene Ebbs, MD REFERRING PROVIDER: Mcarthur Rossetti, MD  Progress Note Reporting Period 05/12/22 to 05/24/22  See note below for Objective Data and Assessment of Progress/Goals.    END OF SESSION:   PT End of Session - 05/24/22 1153     Visit Number 16    Number of Visits 28    Date for PT Re-Evaluation 07/07/22    Authorization Type [employer]    Progress Note Due on Visit 71    PT Start Time 1145    PT Stop Time 1242    PT Time Calculation (min) 57 min    Activity Tolerance Patient tolerated treatment well    Behavior During Therapy WFL for tasks assessed/performed             Past Medical History:  Diagnosis Date   Anemia    Arthritis    Cancer of left kidney ([place])    Left renal mass   GERD (gastroesophageal reflux disease)    History of kidney stones    x3 -remains with one on right.    Hx of seasonal allergies    rare flare ups   Hypertension    Left renal mass    Obesity    Pneumonia    hx of 20 years ago   Right ureteral stone    Type 2 diabetes mellitus ([place])    Type 2   Wears glasses    Wears partial dentures    upper   Past Surgical History:  Procedure Laterality Date   CESAREAN SECTION  yrs ago   CYSTOSCOPY WITH RETROGRADE PYELOGRAM, URETEROSCOPY AND STENT PLACEMENT Left 05/24/2018   Procedure: CYSTOSCOPY WITH RETROGRADE PYELOGRAM, URETEROSCOPY AND STENT PLACEMENT;  Surgeon: Alexis Frock, MD;  Location: [facility];  Service: Urology;  Laterality: Left;   CYSTOSCOPY WITH RETROGRADE PYELOGRAM, URETEROSCOPY AND STENT PLACEMENT Left 02/27/2020   Procedure: CYSTOSCOPY WITH RETROGRADE PYELOGRAM, URETEROSCOPY AND STENT PLACEMENT;  Surgeon: Alexis Frock, MD;  Location: [facility];  Service: Urology;  Laterality: Left;  1 HR   CYSTOSCOPY WITH RETROGRADE PYELOGRAM,  URETEROSCOPY AND STENT PLACEMENT Bilateral 10/20/2020   Procedure: CYSTOSCOPY WITH RETROGRADE PYELOGRAM, URETEROSCOPY AND STENT PLACEMENT;  Surgeon: Alexis Frock, MD;  Location: [facility];  Service: Urology;  Laterality: Bilateral;  75 MINS   HOLMIUM LASER APPLICATION Left 01/24/346   Procedure: HOLMIUM LASER APPLICATION;  Surgeon: Alexis Frock, MD;  Location: [facility];  Service: Urology;  Laterality: Left;   HOLMIUM LASER APPLICATION Left 01/23/5955   Procedure: HOLMIUM LASER APPLICATION;  Surgeon: Alexis Frock, MD;  Location: [facility];  Service: Urology;  Laterality: Left;   HOLMIUM LASER APPLICATION Bilateral 38/75/6433   Procedure: HOLMIUM LASER APPLICATION;  Surgeon: Alexis Frock, MD;  Location: [facility];  Service: Urology;  Laterality: Bilateral;   ROBOTIC ASSITED PARTIAL NEPHRECTOMY Left 12/22/2015   Procedure: XI ROBOTIC ASSITED PARTIAL NEPHRECTOMY;  Surgeon: Alexis Frock, MD;  Location: [facility];  Service: Urology;  Laterality: Left;   TOTAL ABDOMINAL HYSTERECTOMY W/ BILATERAL SALPINGOOPHORECTOMY  1980's   TOTAL KNEE ARTHROPLASTY Left 01/12/2017   Procedure: LEFT TOTAL KNEE ARTHROPLASTY and Right knee steroid injection;  Surgeon: Mcarthur Rossetti, MD;  Location: [facility];  Service: Orthopedics;  Laterality: Left;   TOTAL KNEE ARTHROPLASTY Right 04/14/2022   Procedure: RIGHT TOTAL KNEE ARTHROPLASTY;  Surgeon: Mcarthur Rossetti, MD;  Location: [facility];  Service: Orthopedics;  Laterality: Right;   Patient Active Problem List   Diagnosis Date Noted   Status post total right knee replacement 04/14/2022   Chest pain 10/09/2020   Nausea 10/09/2020   Hypertension    Type 2 diabetes mellitus ([facility])    GERD (gastroesophageal reflux disease)    Facet degeneration of lumbar region 12/18/2018   Chronic low back pain without sciatica 06/18/2017   Primary osteoarthritis of right knee 03/26/2017   Presence of left artificial knee joint 02/26/2017   Unilateral primary osteoarthritis, left  knee 01/12/2017   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/12/2017   Renal mass 12/22/2015    REFERRING DIAG: Z96.651 (ICD-10-CM) - Status post right knee replacement  THERAPY DIAG:  Stiffness of right knee, not elsewhere classified  Muscle weakness (generalized)  Localized edema  Acute pain of right knee  Other abnormalities of gait and mobility  Unsteadiness on feet  Rationale for Evaluation and Treatment Rehabilitation  ONSET DATE: 04/14/2022 right TKR   SUBJECTIVE:    SUBJECTIVE STATEMENT: She has been doing the scar mobilization and it has been going well with some spots of increased sensitivity. She didn't sleep well last night more from concerns not pain. She didn't note any increased sx after the manual emphasis in the last session, and has decreased swelling in her foot.  PERTINENT HISTORY: OA, left TKR 2018, left kidney CA with partial nephrectomy 2017, HTN, obesity, DM2, LBP with sciatica RLE   PAIN:  NPRS scale: 2-3/10 upon arrival Pain location: Rt knee/lower leg Pain description: throbbing, stinging, burning Aggravating factors: increased activity.  Relieving factors: meds, ice, elevation   PRECAUTIONS: None   WEIGHT BEARING RESTRICTIONS No   FALLS:  Has patient fallen in last 6 months? No   LIVING ENVIRONMENT: Lives with: lives with their spouse and lives with their daughter Lives in: House/apartment Stairs: Yes: External: 2 steps; on left going up Has following equipment at home: Single point cane, Walker - 2 wheeled, and built in Civil engineer, contracting   OCCUPATION: retired   PLOF: Independent, Independent with household mobility without device, and Independent with community mobility without device   PATIENT GOALS  walk without device, water aerobics,      OBJECTIVE:    DIAGNOSTIC FINDINGS:  04/14/22 Status post right knee arthroplasty with surgical sutures about the anterior aspect of the knee. Subcutaneous emphysema as expected. No perihardware  fracture.   PATIENT SURVEYS:  04/19/2022 FOTO   43% with target 63% 05/10/2022 FOTO   55%   COGNITION: 04/19/2022 Overall cognitive status: Within functional limits for tasks assessed                          SENSATION: 04/19/2022 Light touch: WFL   EDEMA:  04/19/2022 RLE:  above knee 61.3cm around knee  55cm  below knee 43.4cm LLE:  above knee 56.0 cm  around knee 45cm below knee  41cm   POSTURE: 6/28/203:  rounded shoulders, forward head, flexed trunk , and weight shift left   PALPATION: 6/28/203:  tenderness all areas of right knee, redness & warmth laterally and medially.    LOWER EXTREMITY ROM:   ROM P:passive  A:active Right 04/19/2022 Left 04/19/2022 Right 04/26/2022 Right  05/03/2022 Right 05/10/2022 Right 05/17/2022 Right 05/24/2022  Hip flexion             Hip extension             Hip abduction  Hip adduction             Hip internal rotation             Hip external rotation             Knee flexion Supine head propped up P: 47* Seated A: 42*   Supine AROM heel slide 68 deg Seated P: 77* A: 70* Seated P: 95* A: 90* after manual Seated P: 93* A: 90* After manual Supine P: 94* A: 90*  Knee extension Supine P: -8* A: -30* SAQ     Seated LAQ A: -19* Supine P: -9* A:-15* After manual  Supine  P: -10* A: -14*  Ankle dorsiflexion             Ankle plantarflexion             Ankle inversion             Ankle eversion              (Blank rows = not tested)   LOWER EXTREMITY MMT:   MMT Right 04/19/2022 Left 04/19/2022  Hip flexion      Hip extension      Hip abduction      Hip adduction      Hip internal rotation      Hip external rotation      Knee flexion 2-/5    Knee extension 2-/5    Ankle dorsiflexion      Ankle plantarflexion      Ankle inversion      Ankle eversion       (Blank rows = not tested)   BED MOBILITY:   04/19/2022  Patient requires total manual assist for RLE for sit to supine & supine to sit.   05/05/2022  Patient  moves RLE independently for sit to supine & supine to sit, and requires min assistance for raising RLE to elevated heights   GAIT: 05/10/2022:  pt amb in & out of clinic with [facility] safely >250' 05/05/2022:  Household distance SPC use in [place] UE within clinic c SBA to supervision.  < 150 ft   04/28/2022:  FWW ambulation continued in clinic   6/28/203:  Distance walked: 50' Assistive device utilized: Environmental consultant - 2 wheeled Level of assistance: SBA Comments: antalgic gait with decreased stance RLE, right knee flexed in stance with minimal to no increase flexion for swing.      TODAY'S TREATMENT: 05/24/22 Therex:             SciFit bike seat 11 BUEs / BLEs 10 mins fwd slow turning Leg press 45* double leg 87 lbs x 15, Rt leg 37 lbs 2 x 10, 3s extension hold Side-lying AAROM hip extension to tolerance Side-lying quad stretch with strap 4 x 15s with tolerable hip extension Supine quad sets with heel prop x 10 reps  Vasopneumatic             Supine head elevated 10 mins 34 deg medium compression c R leg in elevation  05/22/22 Therex:  Recumbent bike seat 6 partial rotations x 8 minutes  Seated LAQ with active knee flexion and contralateral knee motion x 10 reps  Manual:  Seated Rt knee flexion c distraction/IR mobilization c movement.  Contract relax techniques for Rt knee flexion - agonist and antagonist  IASTM to medial, anterior, lateral knee tendons and ligaments with knee in 70* flexion  Hip abduction PROM with 0* and 30* hip flexion to tolerance,  pt reported lateral hip pain  Supine knee extension PROM to tolerance  PT demo and verbal explanation of scar mobilization techniques for mobility and desensitization, pt verbalized understanding  Vasopneumatic             Supine head elevated 10 mins 34 deg medium compression c R leg in elevation  05/19/22 Precor bike seat 7 partial rotations x6 minutes  MFR R TFL x3 rounds/3 different sites good release of mm tension noted   Knee self-flexion  stretch on 8 inch step 10x10 second holds  TKEs in standing with red TB 1x15 3 second holds  LAQs red TB 1x10 3 second holds Contact relax techniques prior to knee flexion overpressure in sitting   05/17/2022 Therex:             SciFit bike seat 11 BUEs / BLEs 8 mins fwd slow turning Standing counter hip flexion stretch for upright posture at neutral x 2 minutes. Pt verbalized understanding & this stretch did not seem to bother her hip.   Neuro Re-Ed:  Foam beam // bars alternating standing marching x8 bil. fingertip UE support  Foam beam // bars church pew anterior/posterior ankle strategy x 1 min no UE support  Foam beam // bars modified tandem stance no UEs 30s eyes open 30s eyes closed, LLE posterior 2 minor losses of balance with UE support to recover and RLE posterior 3 losses of balance with unable to recover back to foam balance  Manual Therapy  Seated Rt knee flexion c distraction/IR mobilization c movement.  Contract relax techniques for Rt knee flexion.   Supine Rt hip flexion and rotation stretching with knee in pain-free range. Pt reported lateral and posterior Rt hip pain with external rotation and abduction, and no symptoms with straight hip flexion or internal rotation. PT educated on towel roll bolster under hip to reduce external rotation in order to calm sx, pt verbalized understanding  Vasopneumatic             Supine head elevated 10 mins 34 deg medium compression c R leg in elevation    PATIENT EDUCATION:  Education details: anatomy/cause of and role of trigger points in pain, gait compensations, benefits of manual, general course of progression after surgery  Person educated: Patient Education method: Explanation, Demonstration, Tactile cues, Verbal cues, and Handouts Education comprehension: verbalized understanding, returned demonstration, verbal cues required, tactile cues required, and needs further education     HOME EXERCISE PROGRAM: Access Code:  [place] URL: [email or web address] Date: 05/15/2022 Prepared by: Jamey Reas  Exercises - Seated Knee Flexion Extension AROM   - 2-4 x daily - 7 x weekly - 2 sets - 10 reps - 5 seconds hold - Seated Hamstring Stretch with Strap  - 2-4 x daily - 7 x weekly - 1 sets - 2-3 reps - 30 seconds hold - Seated Quad Set  - 2-4 x daily - 7 x weekly - 2 sets - 10 reps - 5 seconds hold - Supine Short Arc Quad  - 2-4 x daily - 7 x weekly - 2 sets - 10 reps - 5 seconds hold - Supine Heel Slide with Strap  - 2-4 x daily - 7 x weekly - 2 sets - 10 reps - 5 seconds hold - Quad Setting and Stretching  - 2-4 x daily - 7 x weekly - 5-10 sets - 10 reps - prop 5-10 minutes & quad set5 seconds hold - Seated Passive Knee Extension with Weight  - 3  x daily - 7 x weekly - 1 sets - 1 reps - 5 min hold - seated single straight leg raise   - 3-5 x daily - 7 x weekly - 2-3 sets - 10 reps - 5 seconds hold - Supine Straight Leg Raises  - 3-5 x daily - 7 x weekly - 4 sets - 5 reps - 3 seconds hold - Modified Thomas Stretch  - 2-3 x daily - 7 x weekly - 1 sets - 3 reps - 30 seconds hold   ASSESSMENT:   CLINICAL IMPRESSION: In this progress note period, she has continued to progress in strength and balance. Knee range of motion remains limited by stiffness, muscle tightness, and pain. She had a recent onset of lateral hip pain that has increased knee sensitivity and reduced activity, which is beginning to improve but still limits some positioning. She continues to benefit from skilled PT to address remaining mobility, strength, and balance deficits to achieve functional goals.   OBJECTIVE IMPAIRMENTS Abnormal gait, decreased activity tolerance, decreased balance, decreased endurance, decreased knowledge of use of DME, decreased mobility, difficulty walking, decreased ROM, decreased strength, increased edema, impaired flexibility, postural dysfunction, obesity, and pain.    ACTIVITY LIMITATIONS carrying,  lifting, bending, sitting, standing, squatting, sleeping, stairs, transfers, bed mobility, and locomotion level   PARTICIPATION LIMITATIONS: meal prep, cleaning, driving, and community activity   PERSONAL FACTORS Age, Fitness, Past/current experiences, and 3+ comorbidities: see PMH  are also affecting patient's functional outcome.    REHAB POTENTIAL: Good   CLINICAL DECISION MAKING: Stable/uncomplicated   EVALUATION COMPLEXITY: Low     GOALS: Goals reviewed with patient? Yes   SHORT TERM GOALS: Target date: 05/19/2022   Patient independent and verbalizes compliance with initial HEP Baseline: SEE OBJECTIVE DATA Goal status: met 05/10/22   2.  Patient reports 50% improvement in right knee pain. Baseline: SEE OBJECTIVE DATA Goal status: met 05/10/22   3.  PROM right knee extension -3* to flexion 90* Baseline: SEE OBJECTIVE DATA Goal status: partially met - 05/17/2022   LONG TERM GOALS: Target date: 07/07/2022   Patient will improve FOTO score to 63% Baseline: SEE OBJECTIVE DATA Goal status: on going - assessed 05/12/2022   2.  Patient reports right knee pain </= 2/10 with standing & gait activities.  Baseline: SEE OBJECTIVE DATA Goal status: on going - assessed 05/24/2022   3.  Right Knee PROM 0* extension to 100* flexion Baseline: SEE OBJECTIVE DATA Goal status: on going - assessed 05/24/2022   4.  Right Knee AROM seated -3* extension to 90* flexion Baseline: SEE OBJECTIVE DATA Goal status: on going - assessed 05/24/2022   5.  Patient ambulates >300' community distances including negotiating ramps, curbs & stairs with LRAD independently. Baseline: SEE OBJECTIVE DATA Goal status: on going - assessed 05/24/2022     PLAN: PT FREQUENCY: 2-3 x/wk   PT DURATION: 12 weeks   PLANNED INTERVENTIONS: Therapeutic exercises, Therapeutic activity, Neuromuscular re-education, Balance training, Gait training, Patient/Family education, Joint mobilization, Stair training, DME instructions,  Aquatic Therapy, Electrical stimulation, Cryotherapy, Moist heat, scar mobilization, Taping, Vasopneumatic device, Manual therapy, and physical performance testing   PLAN FOR NEXT SESSION: set updated STGs with target date 06/09/22.  Emphasize stretching long muscles that cross knee and hip to tolerance, add to HEP as indicated. Manual therapy & progressing strengthening and end range knee mobility.  Jana Hakim, SPT 05/24/2022, 12:49 PM  This entire session of physical therapy was performed under the direct supervision of PT  signing evaluation /treatment. PT reviewed note and agrees.  Jamey Reas, PT, DPT 05/24/2022 12:55 PM

## 2022-05-24 NOTE — Progress Notes (Signed)
The patient is now 6 weeks status post a right total knee arthroplasty.  She is now making progress in terms of getting her flexion back with that right knee.  She is greatly improved from her last visit with me which was her first visit postoperative.  I could only flex her to just past 60 degrees then.  In the office today I can flex her to just past 90 degrees.  The knee feels stable.  There is swelling to be expected but no redness.  Her calf is soft.  She will continue outpatient physical therapy since this is helping her quite a bit.  I did refill her pain medication which she is already using sparingly.  She can stop her compressive hose.  I will see her back in 4 weeks to see how she is doing overall but no x-rays are needed.

## 2022-05-29 ENCOUNTER — Ambulatory Visit (INDEPENDENT_AMBULATORY_CARE_PROVIDER_SITE_OTHER): Payer: Medicare (Managed Care) | Admitting: Physical Therapy

## 2022-05-29 ENCOUNTER — Encounter: Payer: Self-pay | Admitting: Physical Therapy

## 2022-05-29 DIAGNOSIS — R600 Localized edema: Secondary | ICD-10-CM

## 2022-05-29 DIAGNOSIS — M25661 Stiffness of right knee, not elsewhere classified: Secondary | ICD-10-CM | POA: Diagnosis not present

## 2022-05-29 DIAGNOSIS — R2681 Unsteadiness on feet: Secondary | ICD-10-CM

## 2022-05-29 DIAGNOSIS — M25561 Pain in right knee: Secondary | ICD-10-CM | POA: Diagnosis not present

## 2022-05-29 DIAGNOSIS — M6281 Muscle weakness (generalized): Secondary | ICD-10-CM | POA: Diagnosis not present

## 2022-05-29 DIAGNOSIS — R2689 Other abnormalities of gait and mobility: Secondary | ICD-10-CM

## 2022-05-29 NOTE — Therapy (Signed)
OUTPATIENT PHYSICAL THERAPY TREATMENT NOTE   Patient Name: Debbie Velasquez MRN: [id number] DOB:1951/08/30, 71 y.o., female Today's Date: 05/29/2022  PCP: Nolene Ebbs, MD REFERRING PROVIDER: Mcarthur Rossetti, MD   END OF SESSION:   PT End of Session - 05/29/22 1437     Visit Number 17    Number of Visits 28    Date for PT Re-Evaluation 07/07/22    Authorization Type [employer]    Progress Note Due on Visit 26    PT Start Time 1430    PT Stop Time 1525    PT Time Calculation (min) 55 min    Activity Tolerance Patient tolerated treatment well    Behavior During Therapy WFL for tasks assessed/performed              Past Medical History:  Diagnosis Date   Anemia    Arthritis    Cancer of left kidney ([place])    Left renal mass   GERD (gastroesophageal reflux disease)    History of kidney stones    x3 -remains with one on right.    Hx of seasonal allergies    rare flare ups   Hypertension    Left renal mass    Obesity    Pneumonia    hx of 20 years ago   Right ureteral stone    Type 2 diabetes mellitus ([place])    Type 2   Wears glasses    Wears partial dentures    upper   Past Surgical History:  Procedure Laterality Date   CESAREAN SECTION  yrs ago   CYSTOSCOPY WITH RETROGRADE PYELOGRAM, URETEROSCOPY AND STENT PLACEMENT Left 05/24/2018   Procedure: CYSTOSCOPY WITH RETROGRADE PYELOGRAM, URETEROSCOPY AND STENT PLACEMENT;  Surgeon: Alexis Frock, MD;  Location: [facility];  Service: Urology;  Laterality: Left;   CYSTOSCOPY WITH RETROGRADE PYELOGRAM, URETEROSCOPY AND STENT PLACEMENT Left 02/27/2020   Procedure: CYSTOSCOPY WITH RETROGRADE PYELOGRAM, URETEROSCOPY AND STENT PLACEMENT;  Surgeon: Alexis Frock, MD;  Location: [facility];  Service: Urology;  Laterality: Left;  1 HR   CYSTOSCOPY WITH RETROGRADE PYELOGRAM, URETEROSCOPY AND STENT PLACEMENT Bilateral 10/20/2020   Procedure: CYSTOSCOPY WITH RETROGRADE PYELOGRAM, URETEROSCOPY AND STENT PLACEMENT;   Surgeon: Alexis Frock, MD;  Location: [facility];  Service: Urology;  Laterality: Bilateral;  75 MINS   HOLMIUM LASER APPLICATION Left 06/27/7095   Procedure: HOLMIUM LASER APPLICATION;  Surgeon: Alexis Frock, MD;  Location: [facility];  Service: Urology;  Laterality: Left;   HOLMIUM LASER APPLICATION Left 11/30/3660   Procedure: HOLMIUM LASER APPLICATION;  Surgeon: Alexis Frock, MD;  Location: [facility];  Service: Urology;  Laterality: Left;   HOLMIUM LASER APPLICATION Bilateral 94/76/5465   Procedure: HOLMIUM LASER APPLICATION;  Surgeon: Alexis Frock, MD;  Location: [facility];  Service: Urology;  Laterality: Bilateral;   ROBOTIC ASSITED PARTIAL NEPHRECTOMY Left 12/22/2015   Procedure: XI ROBOTIC ASSITED PARTIAL NEPHRECTOMY;  Surgeon: Alexis Frock, MD;  Location: [facility];  Service: Urology;  Laterality: Left;   TOTAL ABDOMINAL HYSTERECTOMY W/ BILATERAL SALPINGOOPHORECTOMY  1980's   TOTAL KNEE ARTHROPLASTY Left 01/12/2017   Procedure: LEFT TOTAL KNEE ARTHROPLASTY and Right knee steroid injection;  Surgeon: Mcarthur Rossetti, MD;  Location: [facility];  Service: Orthopedics;  Laterality: Left;   TOTAL KNEE ARTHROPLASTY Right 04/14/2022   Procedure: RIGHT TOTAL KNEE ARTHROPLASTY;  Surgeon: Mcarthur Rossetti, MD;  Location: [facility];  Service: Orthopedics;  Laterality: Right;   Patient Active Problem List   Diagnosis Date Noted   Status post  total right knee replacement 04/14/2022   Chest pain 10/09/2020   Nausea 10/09/2020   Hypertension    Type 2 diabetes mellitus ([facility])    GERD (gastroesophageal reflux disease)    Facet degeneration of lumbar region 12/18/2018   Chronic low back pain without sciatica 06/18/2017   Primary osteoarthritis of right knee 03/26/2017   Presence of left artificial knee joint 02/26/2017   Unilateral primary osteoarthritis, left knee 01/12/2017   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/12/2017   Renal mass 12/22/2015    REFERRING DIAG: Z96.651 (ICD-10-CM) -  Status post right knee replacement  THERAPY DIAG:  Stiffness of right knee, not elsewhere classified  Muscle weakness (generalized)  Localized edema  Acute pain of right knee  Other abnormalities of gait and mobility  Unsteadiness on feet  Rationale for Evaluation and Treatment Rehabilitation  ONSET DATE: 04/14/2022 right TKR   SUBJECTIVE:    SUBJECTIVE STATEMENT: She had a busy weekend with 2 cookouts and a banquet on Saturday and service on Sunday. She feels especially tired today with a lot of swelling in her knee and foot.  PERTINENT HISTORY: OA, left TKR 2018, left kidney CA with partial nephrectomy 2017, HTN, obesity, DM2, LBP with sciatica RLE   PAIN:  NPRS scale: 3/10 upon arrival Pain location: Rt knee/lower leg Pain description: throbbing, stinging, burning Aggravating factors: increased activity.  Relieving factors: meds, ice, elevation   PRECAUTIONS: None   WEIGHT BEARING RESTRICTIONS No   FALLS:  Has patient fallen in last 6 months? No   LIVING ENVIRONMENT: Lives with: lives with their spouse and lives with their daughter Lives in: House/apartment Stairs: Yes: External: 2 steps; on left going up Has following equipment at home: Single point cane, Walker - 2 wheeled, and built in Civil engineer, contracting   OCCUPATION: retired   PLOF: Independent, Independent with household mobility without device, and Independent with community mobility without device   PATIENT GOALS  walk without device, water aerobics,      OBJECTIVE:    DIAGNOSTIC FINDINGS:  04/14/22 Status post right knee arthroplasty with surgical sutures about the anterior aspect of the knee. Subcutaneous emphysema as expected. No perihardware fracture.   PATIENT SURVEYS:  04/19/2022 FOTO   43% with target 63% 05/10/2022 FOTO   55%   COGNITION: 04/19/2022 Overall cognitive status: Within functional limits for tasks assessed                          SENSATION: 04/19/2022 Light touch: WFL   EDEMA:   04/19/2022 RLE:  above knee 61.3cm around knee  55cm  below knee 43.4cm LLE:  above knee 56.0 cm  around knee 45cm below knee  41cm   POSTURE: 6/28/203:  rounded shoulders, forward head, flexed trunk , and weight shift left   PALPATION: 6/28/203:  tenderness all areas of right knee, redness & warmth laterally and medially.    LOWER EXTREMITY ROM:   ROM P:passive  A:active Right 04/19/2022 Left 04/19/2022 Right 04/26/2022 Right  05/03/2022 Right 05/10/2022 Right 05/17/2022 Right 05/24/2022  Hip flexion             Hip extension             Hip abduction             Hip adduction             Hip internal rotation             Hip  external rotation             Knee flexion Supine head propped up P: 47* Seated A: 42*   Supine AROM heel slide 68 deg Seated P: 77* A: 70* Seated P: 95* A: 90* after manual Seated P: 93* A: 90* After manual Supine P: 94* A: 90*  Knee extension Supine P: -8* A: -30* SAQ     Seated LAQ A: -19* Supine P: -9* A:-15* After manual  Supine  P: -10* A: -14*  Ankle dorsiflexion             Ankle plantarflexion             Ankle inversion             Ankle eversion              (Blank rows = not tested)   LOWER EXTREMITY MMT:   MMT Right 04/19/2022 Left 04/19/2022  Hip flexion      Hip extension      Hip abduction      Hip adduction      Hip internal rotation      Hip external rotation      Knee flexion 2-/5    Knee extension 2-/5    Ankle dorsiflexion      Ankle plantarflexion      Ankle inversion      Ankle eversion       (Blank rows = not tested)   BED MOBILITY:   04/19/2022  Patient requires total manual assist for RLE for sit to supine & supine to sit.   05/05/2022  Patient moves RLE independently for sit to supine & supine to sit, and requires min assistance for raising RLE to elevated heights   GAIT: 05/10/2022:  pt amb in & out of clinic with [facility] safely >250' 05/05/2022:  Household distance SPC use in [place] [facility] within clinic c SBA to  supervision.  < 150 ft   04/28/2022:  FWW ambulation continued in clinic   6/28/203:  Distance walked: 50' Assistive device utilized: Environmental consultant - 2 wheeled Level of assistance: SBA Comments: antalgic gait with decreased stance RLE, right knee flexed in stance with minimal to no increase flexion for swing.      TODAY'S TREATMENT: 05/29/22 Therex:             SciFit bike seat 11 BLEs 8 mins level 1 Supine elevation with ankle alphabet, PT education on set up in home and suggestion for 2x/day for 15 min, pt verbalized understanding Supine heel slides with strap with 3s knee flexion hold on red 55 cm ball x 5 reps Supine hip flexion 90* knee flexion gravity assist AAROM x 10 reps Seated knee extension heel prop on chair with 4lb weight 2 x 2 min ext, 2 min flexion Side-lying quad stretch with strap 2 x 30s, verbal and tactile cueing for hip extension and pillow between knees for hip alignment PT and pt reviewed new HEP with handout and verbal explanation, pt verbalized understanding  Vasopneumatic             Supine head elevated 10 mins 34 deg medium compression c R leg in elevation  05/24/22 Therex:             SciFit bike seat 11 BUEs / BLEs 10 mins fwd slow turning Leg press 45* double leg 87 lbs x 15, Rt leg 37 lbs 2 x 10, 3s extension hold Side-lying AAROM hip extension to  tolerance Side-lying quad stretch with strap 4 x 15s with tolerable hip extension Supine quad sets with heel prop x 10 reps  Vasopneumatic             Supine head elevated 10 mins 34 deg medium compression c R leg in elevation  05/22/22 Therex:  Recumbent bike seat 6 partial rotations x 8 minutes  Seated LAQ with active knee flexion and contralateral knee motion x 10 reps  Manual:  Seated Rt knee flexion c distraction/IR mobilization c movement.  Contract relax techniques for Rt knee flexion - agonist and antagonist  IASTM to medial, anterior, lateral knee tendons and ligaments with knee in 70* flexion  Hip  abduction PROM with 0* and 30* hip flexion to tolerance, pt reported lateral hip pain  Supine knee extension PROM to tolerance  PT demo and verbal explanation of scar mobilization techniques for mobility and desensitization, pt verbalized understanding  Vasopneumatic             Supine head elevated 10 mins 34 deg medium compression c R leg in elevation  05/19/22 Precor bike seat 7 partial rotations x6 minutes  MFR R TFL x3 rounds/3 different sites good release of mm tension noted   Knee self-flexion stretch on 8 inch step 10x10 second holds  TKEs in standing with red TB 1x15 3 second holds  LAQs red TB 1x10 3 second holds Contact relax techniques prior to knee flexion overpressure in sitting     PATIENT EDUCATION:  Education details: anatomy/cause of and role of trigger points in pain, gait compensations, benefits of manual, general course of progression after surgery  Person educated: Patient Education method: Explanation, Demonstration, Tactile cues, Verbal cues, and Handouts Education comprehension: verbalized understanding, returned demonstration, verbal cues required, tactile cues required, and needs further education     HOME EXERCISE PROGRAM: Access Code: [email or web address] URL: [email or web address] Date: 05/29/2022 Prepared by: Jamey Reas  Exercises - Supine Heel Slide with Strap  - 2-4 x daily - 7 x weekly - 2 sets - 10 reps - 5 seconds hold - Seated Passive Knee Extension with Weight  - 3 x daily - 7 x weekly - 1 sets - 1 reps - 5 min hold - Seated Knee Flexion Extension AROM   - 2-4 x daily - 7 x weekly - 2 sets - 10 reps - 5 seconds hold - Quad Setting and Stretching  - 2-4 x daily - 7 x weekly - 5-10 sets - 10 reps - prop 5-10 minutes & quad set5 seconds hold - Supine Short Arc Quad  - 2-4 x daily - 7 x weekly - 2 sets - 10 reps - 5 seconds hold - Supine Straight Leg Raises  - 3-5 x daily - 7 x weekly - 4 sets - 5 reps - 3 seconds hold - Seated Quad Set  -  2-4 x daily - 7 x weekly - 2 sets - 10 reps - 5 seconds hold - seated single straight leg raise   - 3-5 x daily - 7 x weekly - 2-3 sets - 10 reps - 5 seconds hold - Sidelying Quad Stretch  - 2-3 x daily - 7 x weekly - 1 sets - 3 reps - 30 seconds hold - Seated Hamstring Stretch with Strap  - 2-4 x daily - 7 x weekly - 1 sets - 2-3 reps - 30 seconds hold - Standing Lumbar Extension with Counter  - 2-3 x daily - 7 x weekly - 2 sets -  10 reps - 5 seconds hold - Ankle Alphabet in Elevation  - 2-3 x daily - 7 x weekly - 15 minute hold   ASSESSMENT:   CLINICAL IMPRESSION: She was limited today by increased stiffness and swelling following a high activity weekend. HEP was updated to include stretching of muscles crossing the knee in tolerable positioning, as well as further information for elevation to reduce edema. She continues to benefit from skilled PT to address remaining mobility, strength, and balance deficits to achieve functional goals.   OBJECTIVE IMPAIRMENTS Abnormal gait, decreased activity tolerance, decreased balance, decreased endurance, decreased knowledge of use of DME, decreased mobility, difficulty walking, decreased ROM, decreased strength, increased edema, impaired flexibility, postural dysfunction, obesity, and pain.    ACTIVITY LIMITATIONS carrying, lifting, bending, sitting, standing, squatting, sleeping, stairs, transfers, bed mobility, and locomotion level   PARTICIPATION LIMITATIONS: meal prep, cleaning, driving, and community activity   PERSONAL FACTORS Age, Fitness, Past/current experiences, and 3+ comorbidities: see PMH  are also affecting patient's functional outcome.    REHAB POTENTIAL: Good   CLINICAL DECISION MAKING: Stable/uncomplicated   EVALUATION COMPLEXITY: Low     GOALS: Goals reviewed with patient? Yes   SHORT TERM GOALS: Target date: 06/09/2022   PROM right knee extension -7* to flexion 96* Baseline: SEE OBJECTIVE DATA Goal status: partially met -  05/24/2022   2.  Standing knee extension AROM to -12* Baseline: SEE OBJECTIVE DATA Goal status: INITIAL - 05/29/2022   LONG TERM GOALS: Target date: 07/07/2022   Patient will improve FOTO score to 63% Baseline: SEE OBJECTIVE DATA Goal status: on going - assessed 05/12/2022   2.  Patient reports right knee pain </= 2/10 with standing & gait activities.  Baseline: SEE OBJECTIVE DATA Goal status: on going - assessed 05/24/2022   3.  Right Knee PROM 0* extension to 100* flexion Baseline: SEE OBJECTIVE DATA Goal status: on going - assessed 05/24/2022   4.  Right Knee AROM seated -3* extension to 90* flexion Baseline: SEE OBJECTIVE DATA Goal status: on going - assessed 05/24/2022   5.  Patient ambulates >300' community distances including negotiating ramps, curbs & stairs with LRAD independently. Baseline: SEE OBJECTIVE DATA Goal status: on going - assessed 05/24/2022     PLAN: PT FREQUENCY: 2-3 x/wk   PT DURATION: 12 weeks   PLANNED INTERVENTIONS: Therapeutic exercises, Therapeutic activity, Neuromuscular re-education, Balance training, Gait training, Patient/Family education, Joint mobilization, Stair training, DME instructions, Aquatic Therapy, Electrical stimulation, Cryotherapy, Moist heat, scar mobilization, Taping, Vasopneumatic device, Manual therapy, and physical performance testing   PLAN FOR NEXT SESSION: Schedule more visits, objective measure ROM, verbally review updated HEP. Progress strengthening and knee range - manual assist  Jana Hakim, SPT 05/29/2022, 4:51 PM  This entire session of physical therapy was performed under the direct supervision of PT signing evaluation /treatment. PT reviewed note and agrees.  Jamey Reas, PT, DPT 05/29/2022 4:55 PM

## 2022-05-31 ENCOUNTER — Encounter: Payer: Self-pay | Admitting: Physical Therapy

## 2022-05-31 ENCOUNTER — Ambulatory Visit (INDEPENDENT_AMBULATORY_CARE_PROVIDER_SITE_OTHER): Payer: Medicare (Managed Care) | Admitting: Physical Therapy

## 2022-05-31 DIAGNOSIS — M25561 Pain in right knee: Secondary | ICD-10-CM

## 2022-05-31 DIAGNOSIS — M25661 Stiffness of right knee, not elsewhere classified: Secondary | ICD-10-CM | POA: Diagnosis not present

## 2022-05-31 DIAGNOSIS — M6281 Muscle weakness (generalized): Secondary | ICD-10-CM | POA: Diagnosis not present

## 2022-05-31 DIAGNOSIS — R600 Localized edema: Secondary | ICD-10-CM | POA: Diagnosis not present

## 2022-05-31 DIAGNOSIS — R2689 Other abnormalities of gait and mobility: Secondary | ICD-10-CM

## 2022-05-31 DIAGNOSIS — R2681 Unsteadiness on feet: Secondary | ICD-10-CM

## 2022-05-31 NOTE — Therapy (Signed)
OUTPATIENT PHYSICAL THERAPY TREATMENT NOTE   Patient Name: Debbie Velasquez MRN: [id number] DOB:1951/04/24, 71 y.o., female Today's Date: 05/31/2022  PCP: Nolene Ebbs, MD REFERRING PROVIDER: Mcarthur Rossetti, MD   END OF SESSION:   PT End of Session - 05/31/22 1519     Visit Number 18    Number of Visits 28    Date for PT Re-Evaluation 07/07/22    Authorization Type [employer]    Progress Note Due on Visit 26    PT Start Time 1430    PT Stop Time 1530    PT Time Calculation (min) 60 min    Activity Tolerance Patient tolerated treatment well;Patient limited by pain    Behavior During Therapy WFL for tasks assessed/performed               Past Medical History:  Diagnosis Date   Anemia    Arthritis    Cancer of left kidney ([place])    Left renal mass   GERD (gastroesophageal reflux disease)    History of kidney stones    x3 -remains with one on right.    Hx of seasonal allergies    rare flare ups   Hypertension    Left renal mass    Obesity    Pneumonia    hx of 20 years ago   Right ureteral stone    Type 2 diabetes mellitus ([place])    Type 2   Wears glasses    Wears partial dentures    upper   Past Surgical History:  Procedure Laterality Date   CESAREAN SECTION  yrs ago   CYSTOSCOPY WITH RETROGRADE PYELOGRAM, URETEROSCOPY AND STENT PLACEMENT Left 05/24/2018   Procedure: CYSTOSCOPY WITH RETROGRADE PYELOGRAM, URETEROSCOPY AND STENT PLACEMENT;  Surgeon: Alexis Frock, MD;  Location: [facility];  Service: Urology;  Laterality: Left;   CYSTOSCOPY WITH RETROGRADE PYELOGRAM, URETEROSCOPY AND STENT PLACEMENT Left 02/27/2020   Procedure: CYSTOSCOPY WITH RETROGRADE PYELOGRAM, URETEROSCOPY AND STENT PLACEMENT;  Surgeon: Alexis Frock, MD;  Location: [facility];  Service: Urology;  Laterality: Left;  1 HR   CYSTOSCOPY WITH RETROGRADE PYELOGRAM, URETEROSCOPY AND STENT PLACEMENT Bilateral 10/20/2020   Procedure: CYSTOSCOPY WITH RETROGRADE PYELOGRAM, URETEROSCOPY  AND STENT PLACEMENT;  Surgeon: Alexis Frock, MD;  Location: [facility];  Service: Urology;  Laterality: Bilateral;  75 MINS   HOLMIUM LASER APPLICATION Left 11/24/2977   Procedure: HOLMIUM LASER APPLICATION;  Surgeon: Alexis Frock, MD;  Location: [facility];  Service: Urology;  Laterality: Left;   HOLMIUM LASER APPLICATION Left 05/31/2118   Procedure: HOLMIUM LASER APPLICATION;  Surgeon: Alexis Frock, MD;  Location: [facility];  Service: Urology;  Laterality: Left;   HOLMIUM LASER APPLICATION Bilateral 41/74/0814   Procedure: HOLMIUM LASER APPLICATION;  Surgeon: Alexis Frock, MD;  Location: [facility];  Service: Urology;  Laterality: Bilateral;   ROBOTIC ASSITED PARTIAL NEPHRECTOMY Left 12/22/2015   Procedure: XI ROBOTIC ASSITED PARTIAL NEPHRECTOMY;  Surgeon: Alexis Frock, MD;  Location: [facility];  Service: Urology;  Laterality: Left;   TOTAL ABDOMINAL HYSTERECTOMY W/ BILATERAL SALPINGOOPHORECTOMY  1980's   TOTAL KNEE ARTHROPLASTY Left 01/12/2017   Procedure: LEFT TOTAL KNEE ARTHROPLASTY and Right knee steroid injection;  Surgeon: Mcarthur Rossetti, MD;  Location: [facility];  Service: Orthopedics;  Laterality: Left;   TOTAL KNEE ARTHROPLASTY Right 04/14/2022   Procedure: RIGHT TOTAL KNEE ARTHROPLASTY;  Surgeon: Mcarthur Rossetti, MD;  Location: [facility];  Service: Orthopedics;  Laterality: Right;   Patient Active Problem List   Diagnosis Date Noted  Status post total right knee replacement 04/14/2022   Chest pain 10/09/2020   Nausea 10/09/2020   Hypertension    Type 2 diabetes mellitus ([facility])    GERD (gastroesophageal reflux disease)    Facet degeneration of lumbar region 12/18/2018   Chronic low back pain without sciatica 06/18/2017   Primary osteoarthritis of right knee 03/26/2017   Presence of left artificial knee joint 02/26/2017   Unilateral primary osteoarthritis, left knee 01/12/2017   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/12/2017   Renal mass 12/22/2015    REFERRING DIAG:  Z30.865 (ICD-10-CM) - Status post right knee replacement  THERAPY DIAG:  Stiffness of right knee, not elsewhere classified  Muscle weakness (generalized)  Localized edema  Acute pain of right knee  Other abnormalities of gait and mobility  Unsteadiness on feet  Rationale for Evaluation and Treatment Rehabilitation  ONSET DATE: 04/14/2022 right TKR   SUBJECTIVE:    SUBJECTIVE STATEMENT: She has been trying the new exercises and they have been rough but tolerable, especially the quad stretch.  PERTINENT HISTORY: OA, left TKR 2018, left kidney CA with partial nephrectomy 2017, HTN, obesity, DM2, LBP with sciatica RLE   PAIN:  NPRS scale: 0/10 upon arrival Pain location: Rt knee/lower leg Pain description: throbbing, stinging, burning Aggravating factors: increased activity.  Relieving factors: meds, ice, elevation   PRECAUTIONS: None   WEIGHT BEARING RESTRICTIONS No   FALLS:  Has patient fallen in last 6 months? No   LIVING ENVIRONMENT: Lives with: lives with their spouse and lives with their daughter Lives in: House/apartment Stairs: Yes: External: 2 steps; on left going up Has following equipment at home: Single point cane, Walker - 2 wheeled, and built in Civil engineer, contracting   OCCUPATION: retired   PLOF: Independent, Independent with household mobility without device, and Independent with community mobility without device   PATIENT GOALS  walk without device, water aerobics,      OBJECTIVE:    DIAGNOSTIC FINDINGS:  04/14/22 Status post right knee arthroplasty with surgical sutures about the anterior aspect of the knee. Subcutaneous emphysema as expected. No perihardware fracture.   PATIENT SURVEYS:  04/19/2022 FOTO   43% with target 63% 05/10/2022 FOTO   55%   COGNITION: 04/19/2022 Overall cognitive status: Within functional limits for tasks assessed                          SENSATION: 04/19/2022 Light touch: WFL   EDEMA:  04/19/2022 RLE:  above knee 61.3cm  around knee  55cm  below knee 43.4cm LLE:  above knee 56.0 cm  around knee 45cm below knee  41cm   POSTURE: 6/28/203:  rounded shoulders, forward head, flexed trunk , and weight shift left   PALPATION: 6/28/203:  tenderness all areas of right knee, redness & warmth laterally and medially.    LOWER EXTREMITY ROM:   ROM P:passive  A:active Right 04/19/22 Right 04/26/22 Right  05/03/22 Right 05/10/22 Right 05/17/22 Right 05/24/22 Right 05/31/22  Hip flexion            Hip extension            Hip abduction            Hip adduction            Hip internal rotation            Hip external rotation            Knee flexion Supine head  propped up P: 47* Seated A: 42* Supine AROM heel slide 68 deg Seated P: 77* A: 70* Seated P: 95* A: 90* after manual Seated P: 93* A: 90* After manual Supine P: 94* A: 90* Seated P: 99* A: 93* After manual  Knee extension Supine P: -8* A: -30* SAQ   Seated LAQ A: -19* Supine P: -9* A:-15* After manual  Supine  P: -10* A: -14* Supine P: -6* After manual  Ankle dorsiflexion            Ankle plantarflexion            Ankle inversion            Ankle eversion             (Blank rows = not tested)   LOWER EXTREMITY MMT:   MMT Right 04/19/2022 Left 04/19/2022  Hip flexion      Hip extension      Hip abduction      Hip adduction      Hip internal rotation      Hip external rotation      Knee flexion 2-/5    Knee extension 2-/5    Ankle dorsiflexion      Ankle plantarflexion      Ankle inversion      Ankle eversion       (Blank rows = not tested)   BED MOBILITY:   04/19/2022  Patient requires total manual assist for RLE for sit to supine & supine to sit.   05/05/2022  Patient moves RLE independently for sit to supine & supine to sit, and requires min assistance for raising RLE to elevated heights   GAIT: 05/10/2022:  pt amb in & out of clinic with [facility] safely >250' 05/05/2022:  Household distance SPC use in [place] [facility] within clinic c SBA  to supervision.  < 150 ft   04/28/2022:  FWW ambulation continued in clinic   6/28/203:  Distance walked: 50' Assistive device utilized: Environmental consultant - 2 wheeled Level of assistance: SBA Comments: antalgic gait with decreased stance RLE, right knee flexed in stance with minimal to no increase flexion for swing.      TODAY'S TREATMENT: 05/31/22 Therex:  Recumbent bike seat 7 partial rotations x 8 minutes  Leg press 45* double leg 87 lbs 2 x 15, Rt leg 37 lbs 1 x 10, 3s extension hold  Standing gastroc stretch at step 2 x 30s  6" step up and back RLE stance x 10 with BUE support, verbal cueing for knee bend and eccentric control  6" lateral step up RLE stance x 10 with BUE support, verbal cueing for knee bend and eccentric control  Manual:  Seated Rt knee flexion PROM to tolerable range c distraction/IR mobilization c movement.  Contract relax techniques for Rt knee flexion  Supine Rt knee extension PROM to tolerable range, grade 2-3 mobilizations  Vasopneumatic             Supine head elevated 10 mins 34 deg medium compression c R leg in elevation  05/29/22 Therex:             SciFit bike seat 11 BLEs 8 mins level 1 Supine elevation with ankle alphabet, PT education on set up in home and suggestion for 2x/day for 15 min, pt verbalized understanding Supine heel slides with strap with 3s knee flexion hold on red 55 cm ball x 5 reps Supine hip flexion 90* knee flexion gravity assist AAROM x 10  reps Seated knee extension heel prop on chair with 4lb weight 2 x 2 min ext, 2 min flexion Side-lying quad stretch with strap 2 x 30s, verbal and tactile cueing for hip extension and pillow between knees for hip alignment PT and pt reviewed new HEP with handout and verbal explanation, pt verbalized understanding  Vasopneumatic             Supine head elevated 10 mins 34 deg medium compression c R leg in elevation  05/24/22 Therex:             SciFit bike seat 11 BUEs / BLEs 10 mins fwd slow turning Leg  press 45* double leg 87 lbs x 15, Rt leg 37 lbs 2 x 10, 3s extension hold Side-lying AAROM hip extension to tolerance Side-lying quad stretch with strap 4 x 15s with tolerable hip extension Supine quad sets with heel prop x 10 reps  Vasopneumatic             Supine head elevated 10 mins 34 deg medium compression c R leg in elevation  05/22/22 Therex:  Recumbent bike seat 6 partial rotations x 8 minutes  Seated LAQ with active knee flexion and contralateral knee motion x 10 reps  Manual:  Seated Rt knee flexion c distraction/IR mobilization c movement.  Contract relax techniques for Rt knee flexion - agonist and antagonist  IASTM to medial, anterior, lateral knee tendons and ligaments with knee in 70* flexion  Hip abduction PROM with 0* and 30* hip flexion to tolerance, pt reported lateral hip pain  Supine knee extension PROM to tolerance  PT demo and verbal explanation of scar mobilization techniques for mobility and desensitization, pt verbalized understanding  Vasopneumatic             Supine head elevated 10 mins 34 deg medium compression c R leg in elevation  PATIENT EDUCATION:  Education details: anatomy/cause of and role of trigger points in pain, gait compensations, benefits of manual, general course of progression after surgery  Person educated: Patient Education method: Explanation, Demonstration, Tactile cues, Verbal cues, and Handouts Education comprehension: verbalized understanding, returned demonstration, verbal cues required, tactile cues required, and needs further education     HOME EXERCISE PROGRAM: Access Code: [email or web address] URL: [email or web address] Date: 05/29/2022 Prepared by: Jamey Reas  Exercises - Supine Heel Slide with Strap  - 2-4 x daily - 7 x weekly - 2 sets - 10 reps - 5 seconds hold - Seated Passive Knee Extension with Weight  - 3 x daily - 7 x weekly - 1 sets - 1 reps - 5 min hold - Seated Knee Flexion Extension AROM   - 2-4 x daily -  7 x weekly - 2 sets - 10 reps - 5 seconds hold - Quad Setting and Stretching  - 2-4 x daily - 7 x weekly - 5-10 sets - 10 reps - prop 5-10 minutes & quad set5 seconds hold - Supine Short Arc Quad  - 2-4 x daily - 7 x weekly - 2 sets - 10 reps - 5 seconds hold - Supine Straight Leg Raises  - 3-5 x daily - 7 x weekly - 4 sets - 5 reps - 3 seconds hold - Seated Quad Set  - 2-4 x daily - 7 x weekly - 2 sets - 10 reps - 5 seconds hold - seated single straight leg raise   - 3-5 x daily - 7 x weekly - 2-3 sets - 10 reps - 5 seconds hold -  Sidelying Quad Stretch  - 2-3 x daily - 7 x weekly - 1 sets - 3 reps - 30 seconds hold - Seated Hamstring Stretch with Strap  - 2-4 x daily - 7 x weekly - 1 sets - 2-3 reps - 30 seconds hold - Standing Lumbar Extension with Counter  - 2-3 x daily - 7 x weekly - 2 sets - 10 reps - 5 seconds hold - Ankle Alphabet in Elevation  - 2-3 x daily - 7 x weekly - 15 minute hold   ASSESSMENT:   CLINICAL IMPRESSION: Her active and passive flexion and passive extension ranges are all improved today following manual techniques. She is continuing to progress with functional strengthening tasks and knee mobility, and will continue to benefit from skilled PT to address strength, mobility, balance, and gait impairments.   OBJECTIVE IMPAIRMENTS Abnormal gait, decreased activity tolerance, decreased balance, decreased endurance, decreased knowledge of use of DME, decreased mobility, difficulty walking, decreased ROM, decreased strength, increased edema, impaired flexibility, postural dysfunction, obesity, and pain.    ACTIVITY LIMITATIONS carrying, lifting, bending, sitting, standing, squatting, sleeping, stairs, transfers, bed mobility, and locomotion level   PARTICIPATION LIMITATIONS: meal prep, cleaning, driving, and community activity   PERSONAL FACTORS Age, Fitness, Past/current experiences, and 3+ comorbidities: see PMH  are also affecting patient's functional outcome.    REHAB  POTENTIAL: Good   CLINICAL DECISION MAKING: Stable/uncomplicated   EVALUATION COMPLEXITY: Low     GOALS: Goals reviewed with patient? Yes   SHORT TERM GOALS: Target date: 06/09/2022   PROM right knee extension -7* to flexion 96* Baseline: SEE OBJECTIVE DATA Goal status: met 05/31/22   2.  Standing knee extension AROM to -12* Baseline: SEE OBJECTIVE DATA Goal status: INITIAL - 05/29/2022   LONG TERM GOALS: Target date: 07/07/2022   Patient will improve FOTO score to 63% Baseline: SEE OBJECTIVE DATA Goal status: on going - assessed 05/12/2022   2.  Patient reports right knee pain </= 2/10 with standing & gait activities.  Baseline: SEE OBJECTIVE DATA Goal status: on going - assessed 05/24/2022   3.  Right Knee PROM 0* extension to 100* flexion Baseline: SEE OBJECTIVE DATA Goal status: on going - assessed 05/24/2022   4.  Right Knee AROM seated -3* extension to 90* flexion Baseline: SEE OBJECTIVE DATA Goal status: on going - assessed 05/24/2022   5.  Patient ambulates >300' community distances including negotiating ramps, curbs & stairs with LRAD independently. Baseline: SEE OBJECTIVE DATA Goal status: on going - assessed 05/24/2022     PLAN: PT FREQUENCY: 2-3 x/wk   PT DURATION: 12 weeks   PLANNED INTERVENTIONS: Therapeutic exercises, Therapeutic activity, Neuromuscular re-education, Balance training, Gait training, Patient/Family education, Joint mobilization, Stair training, DME instructions, Aquatic Therapy, Electrical stimulation, Cryotherapy, Moist heat, scar mobilization, Taping, Vasopneumatic device, Manual therapy, and physical performance testing   PLAN FOR NEXT SESSION: check STG over next 2 visits, Gait activity without cane for mechanics and reduced knee valgus, progressive exercises & manual techniques for range, standing balance activities, vaso to end  [employer], SPT 05/31/2022, 3:34 PM  This entire session of physical therapy was performed under the direct  supervision of PT signing evaluation /treatment. PT reviewed note and agrees.  Jamey Reas, PT, DPT 05/31/2022 4:55 PM

## 2022-06-05 ENCOUNTER — Ambulatory Visit (INDEPENDENT_AMBULATORY_CARE_PROVIDER_SITE_OTHER): Payer: Medicare (Managed Care) | Admitting: Physical Therapy

## 2022-06-05 ENCOUNTER — Encounter: Payer: Self-pay | Admitting: Physical Therapy

## 2022-06-05 DIAGNOSIS — M6281 Muscle weakness (generalized): Secondary | ICD-10-CM

## 2022-06-05 DIAGNOSIS — M25661 Stiffness of right knee, not elsewhere classified: Secondary | ICD-10-CM

## 2022-06-05 DIAGNOSIS — R600 Localized edema: Secondary | ICD-10-CM | POA: Diagnosis not present

## 2022-06-05 DIAGNOSIS — R2681 Unsteadiness on feet: Secondary | ICD-10-CM

## 2022-06-05 DIAGNOSIS — M25561 Pain in right knee: Secondary | ICD-10-CM | POA: Diagnosis not present

## 2022-06-05 DIAGNOSIS — R2689 Other abnormalities of gait and mobility: Secondary | ICD-10-CM

## 2022-06-05 NOTE — Therapy (Signed)
OUTPATIENT PHYSICAL THERAPY TREATMENT NOTE   Patient Name: Debbie Velasquez MRN: [id number] DOB:09-06-1951, 71 y.o., female Today's Date: 06/05/2022  PCP: Nolene Ebbs, MD REFERRING PROVIDER: Mcarthur Rossetti, MD   END OF SESSION:   PT End of Session - 06/05/22 1453     Visit Number 19    Number of Visits 28    Date for PT Re-Evaluation 07/07/22    Authorization Type [employer]    Progress Note Due on Visit 26    PT Start Time 1450    PT Stop Time 1540    PT Time Calculation (min) 50 min    Activity Tolerance Patient tolerated treatment well    Behavior During Therapy WFL for tasks assessed/performed                Past Medical History:  Diagnosis Date   Anemia    Arthritis    Cancer of left kidney ([place])    Left renal mass   GERD (gastroesophageal reflux disease)    History of kidney stones    x3 -remains with one on right.    Hx of seasonal allergies    rare flare ups   Hypertension    Left renal mass    Obesity    Pneumonia    hx of 20 years ago   Right ureteral stone    Type 2 diabetes mellitus ([place])    Type 2   Wears glasses    Wears partial dentures    upper   Past Surgical History:  Procedure Laterality Date   CESAREAN SECTION  yrs ago   CYSTOSCOPY WITH RETROGRADE PYELOGRAM, URETEROSCOPY AND STENT PLACEMENT Left 05/24/2018   Procedure: CYSTOSCOPY WITH RETROGRADE PYELOGRAM, URETEROSCOPY AND STENT PLACEMENT;  Surgeon: Alexis Frock, MD;  Location: [facility];  Service: Urology;  Laterality: Left;   CYSTOSCOPY WITH RETROGRADE PYELOGRAM, URETEROSCOPY AND STENT PLACEMENT Left 02/27/2020   Procedure: CYSTOSCOPY WITH RETROGRADE PYELOGRAM, URETEROSCOPY AND STENT PLACEMENT;  Surgeon: Alexis Frock, MD;  Location: [facility];  Service: Urology;  Laterality: Left;  1 HR   CYSTOSCOPY WITH RETROGRADE PYELOGRAM, URETEROSCOPY AND STENT PLACEMENT Bilateral 10/20/2020   Procedure: CYSTOSCOPY WITH RETROGRADE PYELOGRAM, URETEROSCOPY AND STENT PLACEMENT;   Surgeon: Alexis Frock, MD;  Location: [facility];  Service: Urology;  Laterality: Bilateral;  75 MINS   HOLMIUM LASER APPLICATION Left 11/28/3333   Procedure: HOLMIUM LASER APPLICATION;  Surgeon: Alexis Frock, MD;  Location: [facility];  Service: Urology;  Laterality: Left;   HOLMIUM LASER APPLICATION Left 01/26/6255   Procedure: HOLMIUM LASER APPLICATION;  Surgeon: Alexis Frock, MD;  Location: [facility];  Service: Urology;  Laterality: Left;   HOLMIUM LASER APPLICATION Bilateral 38/93/7342   Procedure: HOLMIUM LASER APPLICATION;  Surgeon: Alexis Frock, MD;  Location: [facility];  Service: Urology;  Laterality: Bilateral;   ROBOTIC ASSITED PARTIAL NEPHRECTOMY Left 12/22/2015   Procedure: XI ROBOTIC ASSITED PARTIAL NEPHRECTOMY;  Surgeon: Alexis Frock, MD;  Location: [facility];  Service: Urology;  Laterality: Left;   TOTAL ABDOMINAL HYSTERECTOMY W/ BILATERAL SALPINGOOPHORECTOMY  1980's   TOTAL KNEE ARTHROPLASTY Left 01/12/2017   Procedure: LEFT TOTAL KNEE ARTHROPLASTY and Right knee steroid injection;  Surgeon: Mcarthur Rossetti, MD;  Location: [facility];  Service: Orthopedics;  Laterality: Left;   TOTAL KNEE ARTHROPLASTY Right 04/14/2022   Procedure: RIGHT TOTAL KNEE ARTHROPLASTY;  Surgeon: Mcarthur Rossetti, MD;  Location: [facility];  Service: Orthopedics;  Laterality: Right;   Patient Active Problem List   Diagnosis Date Noted  Status post total right knee replacement 04/14/2022   Chest pain 10/09/2020   Nausea 10/09/2020   Hypertension    Type 2 diabetes mellitus ([facility])    GERD (gastroesophageal reflux disease)    Facet degeneration of lumbar region 12/18/2018   Chronic low back pain without sciatica 06/18/2017   Primary osteoarthritis of right knee 03/26/2017   Presence of left artificial knee joint 02/26/2017   Unilateral primary osteoarthritis, left knee 01/12/2017   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/12/2017   Renal mass 12/22/2015    REFERRING DIAG: Z00.923 (ICD-10-CM) -  Status post right knee replacement  THERAPY DIAG:  Stiffness of right knee, not elsewhere classified  Muscle weakness (generalized)  Localized edema  Acute pain of right knee  Other abnormalities of gait and mobility  Unsteadiness on feet  Rationale for Evaluation and Treatment Rehabilitation  ONSET DATE: 04/14/2022 right TKR   SUBJECTIVE:    SUBJECTIVE STATEMENT: Denies any pain today, c/o knee stiffness  PERTINENT HISTORY: OA, left TKR 2018, left kidney CA with partial nephrectomy 2017, HTN, obesity, DM2, LBP with sciatica RLE   PAIN:  NPRS scale: 0/10 upon arrival Pain location: Rt knee/lower leg Pain description: throbbing, stinging, burning Aggravating factors: increased activity.  Relieving factors: meds, ice, elevation   PRECAUTIONS: None   WEIGHT BEARING RESTRICTIONS No   FALLS:  Has patient fallen in last 6 months? No   LIVING ENVIRONMENT: Lives with: lives with their spouse and lives with their daughter Lives in: House/apartment Stairs: Yes: External: 2 steps; on left going up Has following equipment at home: Single point cane, Walker - 2 wheeled, and built in Civil engineer, contracting   OCCUPATION: retired   PLOF: Independent, Independent with household mobility without device, and Independent with community mobility without device   PATIENT GOALS  walk without device, water aerobics,      OBJECTIVE:    DIAGNOSTIC FINDINGS:  04/14/22 Status post right knee arthroplasty with surgical sutures about the anterior aspect of the knee. Subcutaneous emphysema as expected. No perihardware fracture.   PATIENT SURVEYS:  04/19/2022 FOTO   43% with target 63% 05/10/2022 FOTO   55%   COGNITION: 04/19/2022 Overall cognitive status: Within functional limits for tasks assessed                          SENSATION: 04/19/2022 Light touch: WFL   EDEMA:  04/19/2022 RLE:  above knee 61.3cm around knee  55cm  below knee 43.4cm LLE:  above knee 56.0 cm  around knee 45cm below  knee  41cm   POSTURE: 6/28/203:  rounded shoulders, forward head, flexed trunk , and weight shift left   PALPATION: 6/28/203:  tenderness all areas of right knee, redness & warmth laterally and medially.    LOWER EXTREMITY ROM:   ROM P:passive  A:active Right 04/19/22 Right 04/26/22 Right  05/03/22 Right 05/10/22 Right 05/17/22 Right 05/24/22 Right 05/31/22 Right 06/05/22  Hip flexion             Hip extension             Hip abduction             Hip adduction             Hip internal rotation             Hip external rotation             Knee flexion Supine head propped up P:  47* Seated A: 42* Supine AROM heel slide 68 deg Seated P: 77* A: 70* Seated P: 95* A: 90* after manual Seated P: 93* A: 90* After manual Supine P: 94* A: 90* Seated P: 99* A: 93* After manual   Knee extension Supine P: -8* A: -30* SAQ   Seated LAQ A: -19* Supine P: -9* A:-15* After manual  Supine  P: -10* A: -14* Supine P: -6* After manual A: -2 (seated LAQ)  Ankle dorsiflexion             Ankle plantarflexion             Ankle inversion             Ankle eversion              (Blank rows = not tested)   LOWER EXTREMITY MMT:   MMT Right 04/19/2022 Left 04/19/2022  Hip flexion      Hip extension      Hip abduction      Hip adduction      Hip internal rotation      Hip external rotation      Knee flexion 2-/5    Knee extension 2-/5    Ankle dorsiflexion      Ankle plantarflexion      Ankle inversion      Ankle eversion       (Blank rows = not tested)   BED MOBILITY:   04/19/2022  Patient requires total manual assist for RLE for sit to supine & supine to sit.   05/05/2022  Patient moves RLE independently for sit to supine & supine to sit, and requires min assistance for raising RLE to elevated heights   GAIT: 05/10/2022:  pt amb in & out of clinic with [facility] safely >250' 05/05/2022:  Household distance SPC use in [place] UE within clinic c SBA to supervision.  < 150 ft   04/28/2022:   FWW ambulation continued in clinic   6/28/203:  Distance walked: 50' Assistive device utilized: Environmental consultant - 2 wheeled Level of assistance: SBA Comments: antalgic gait with decreased stance RLE, right knee flexed in stance with minimal to no increase flexion for swing.      TODAY'S TREATMENT: 06/05/22 Therex: Recumbent bike seat 7 partial rotations x 8 minutes Leg Press 87# bil 2x15; then RLE only 37# 2x15 Forward step up onto 6" step x 10 bil; 1UE support needed progressing no UE support Lateral heel taps with Rt foot on 4" step 2x10 Seated LAQ 5# 2x10; 3 sec hold  Manual: Seated Rt knee flexion PROM to tolerance  Modalities: Supine head elevated 10 mins 34 deg medium compression c R leg in elevation  05/31/22 Therex:  Recumbent bike seat 7 partial rotations x 8 minutes  Leg press 45* double leg 87 lbs 2 x 15, Rt leg 37 lbs 1 x 10, 3s extension hold  Standing gastroc stretch at step 2 x 30s  6" step up and back RLE stance x 10 with BUE support, verbal cueing for knee bend and eccentric control  6" lateral step up RLE stance x 10 with BUE support, verbal cueing for knee bend and eccentric control  Manual:  Seated Rt knee flexion PROM to tolerable range c distraction/IR mobilization c movement.  Contract relax techniques for Rt knee flexion  Supine Rt knee extension PROM to tolerable range, grade 2-3 mobilizations  Vasopneumatic             Supine head elevated  10 mins 34 deg medium compression c R leg in elevation  05/29/22 Therex:             SciFit bike seat 11 BLEs 8 mins level 1 Supine elevation with ankle alphabet, PT education on set up in home and suggestion for 2x/day for 15 min, pt verbalized understanding Supine heel slides with strap with 3s knee flexion hold on red 55 cm ball x 5 reps Supine hip flexion 90* knee flexion gravity assist AAROM x 10 reps Seated knee extension heel prop on chair with 4lb weight 2 x 2 min ext, 2 min flexion Side-lying quad stretch with strap  2 x 30s, verbal and tactile cueing for hip extension and pillow between knees for hip alignment PT and pt reviewed new HEP with handout and verbal explanation, pt verbalized understanding  Vasopneumatic             Supine head elevated 10 mins 34 deg medium compression c R leg in elevation   PATIENT EDUCATION:  Education details: anatomy/cause of and role of trigger points in pain, gait compensations, benefits of manual, general course of progression after surgery  Person educated: Patient Education method: Explanation, Demonstration, Tactile cues, Verbal cues, and Handouts Education comprehension: verbalized understanding, returned demonstration, verbal cues required, tactile cues required, and needs further education     HOME EXERCISE PROGRAM: Access Code: [email or web address] URL: [email or web address] Date: 05/29/2022 Prepared by: Jamey Reas  Exercises - Supine Heel Slide with Strap  - 2-4 x daily - 7 x weekly - 2 sets - 10 reps - 5 seconds hold - Seated Passive Knee Extension with Weight  - 3 x daily - 7 x weekly - 1 sets - 1 reps - 5 min hold - Seated Knee Flexion Extension AROM   - 2-4 x daily - 7 x weekly - 2 sets - 10 reps - 5 seconds hold - Quad Setting and Stretching  - 2-4 x daily - 7 x weekly - 5-10 sets - 10 reps - prop 5-10 minutes & quad set5 seconds hold - Supine Short Arc Quad  - 2-4 x daily - 7 x weekly - 2 sets - 10 reps - 5 seconds hold - Supine Straight Leg Raises  - 3-5 x daily - 7 x weekly - 4 sets - 5 reps - 3 seconds hold - Seated Quad Set  - 2-4 x daily - 7 x weekly - 2 sets - 10 reps - 5 seconds hold - seated single straight leg raise   - 3-5 x daily - 7 x weekly - 2-3 sets - 10 reps - 5 seconds hold - Sidelying Quad Stretch  - 2-3 x daily - 7 x weekly - 1 sets - 3 reps - 30 seconds hold - Seated Hamstring Stretch with Strap  - 2-4 x daily - 7 x weekly - 1 sets - 2-3 reps - 30 seconds hold - Standing Lumbar Extension with Counter  - 2-3 x daily - 7 x  weekly - 2 sets - 10 reps - 5 seconds hold - Ankle Alphabet in Elevation  - 2-3 x daily - 7 x weekly - 15 minute hold   ASSESSMENT:   CLINICAL IMPRESSION: Pt with good progress with active knee extension today, still having some difficulty with flexion but overall progressing well.  Will continue to benefit from PT to maximize function.   OBJECTIVE IMPAIRMENTS Abnormal gait, decreased activity tolerance, decreased balance, decreased endurance, decreased knowledge of use of DME, decreased mobility, difficulty  walking, decreased ROM, decreased strength, increased edema, impaired flexibility, postural dysfunction, obesity, and pain.    ACTIVITY LIMITATIONS carrying, lifting, bending, sitting, standing, squatting, sleeping, stairs, transfers, bed mobility, and locomotion level   PARTICIPATION LIMITATIONS: meal prep, cleaning, driving, and community activity   PERSONAL FACTORS Age, Fitness, Past/current experiences, and 3+ comorbidities: see PMH  are also affecting patient's functional outcome.    REHAB POTENTIAL: Good   CLINICAL DECISION MAKING: Stable/uncomplicated   EVALUATION COMPLEXITY: Low     GOALS: Goals reviewed with patient? Yes   SHORT TERM GOALS: Target date: 06/09/2022   PROM right knee extension -7* to flexion 96* Baseline: SEE OBJECTIVE DATA Goal status: met 05/31/22   2.  Standing knee extension AROM to -12* Baseline: SEE OBJECTIVE DATA Goal status: INITIAL - 05/29/2022   LONG TERM GOALS: Target date: 07/07/2022   Patient will improve FOTO score to 63% Baseline: SEE OBJECTIVE DATA Goal status: on going - assessed 05/12/2022   2.  Patient reports right knee pain </= 2/10 with standing & gait activities.  Baseline: SEE OBJECTIVE DATA Goal status: on going - assessed 05/24/2022   3.  Right Knee PROM 0* extension to 100* flexion Baseline: SEE OBJECTIVE DATA Goal status: on going - assessed 05/24/2022   4.  Right Knee AROM seated -3* extension to 90* flexion Baseline:  SEE OBJECTIVE DATA Goal status: on going - assessed 05/24/2022   5.  Patient ambulates >300' community distances including negotiating ramps, curbs & stairs with LRAD independently. Baseline: SEE OBJECTIVE DATA Goal status: on going - assessed 05/24/2022     PLAN: PT FREQUENCY: 2-3 x/wk   PT DURATION: 12 weeks   PLANNED INTERVENTIONS: Therapeutic exercises, Therapeutic activity, Neuromuscular re-education, Balance training, Gait training, Patient/Family education, Joint mobilization, Stair training, DME instructions, Aquatic Therapy, Electrical stimulation, Cryotherapy, Moist heat, scar mobilization, Taping, Vasopneumatic device, Manual therapy, and physical performance testing   PLAN FOR NEXT SESSION: Needs STGs checked, Gait activity without cane for mechanics and reduced knee valgus, progressive exercises & manual techniques for range, standing balance activities, vaso to end   Laureen Abrahams, PT, DPT 06/05/22 3:39 PM

## 2022-06-07 ENCOUNTER — Encounter: Payer: Self-pay | Admitting: Physical Therapy

## 2022-06-07 ENCOUNTER — Ambulatory Visit (INDEPENDENT_AMBULATORY_CARE_PROVIDER_SITE_OTHER): Payer: Medicare (Managed Care) | Admitting: Physical Therapy

## 2022-06-07 DIAGNOSIS — R600 Localized edema: Secondary | ICD-10-CM | POA: Diagnosis not present

## 2022-06-07 DIAGNOSIS — R2689 Other abnormalities of gait and mobility: Secondary | ICD-10-CM

## 2022-06-07 DIAGNOSIS — M25561 Pain in right knee: Secondary | ICD-10-CM | POA: Diagnosis not present

## 2022-06-07 DIAGNOSIS — M25661 Stiffness of right knee, not elsewhere classified: Secondary | ICD-10-CM | POA: Diagnosis not present

## 2022-06-07 DIAGNOSIS — M6281 Muscle weakness (generalized): Secondary | ICD-10-CM

## 2022-06-07 DIAGNOSIS — R2681 Unsteadiness on feet: Secondary | ICD-10-CM

## 2022-06-07 NOTE — Therapy (Signed)
OUTPATIENT PHYSICAL THERAPY TREATMENT NOTE   Patient Name: Debbie Velasquez MRN: [id number] DOB:[birth year], 71 y.o., female Today's Date: 06/07/2022  PCP: Nolene Ebbs, MD REFERRING PROVIDER: Mcarthur Rossetti, MD   END OF SESSION:   PT End of Session - 06/07/22 1524     Visit Number 20    Number of Visits 28    Date for PT Re-Evaluation 07/07/22    Authorization Type [employer]    Progress Note Due on Visit 26    PT Start Time 1520    PT Stop Time 1600    PT Time Calculation (min) 40 min    Activity Tolerance Patient tolerated treatment well    Behavior During Therapy WFL for tasks assessed/performed             Past Medical History:  Diagnosis Date   Anemia    Arthritis    Cancer of left kidney ([place])    Left renal mass   GERD (gastroesophageal reflux disease)    History of kidney stones    x3 -remains with one on right.    Hx of seasonal allergies    rare flare ups   Hypertension    Left renal mass    Obesity    Pneumonia    hx of 20 years ago   Right ureteral stone    Type 2 diabetes mellitus ([place])    Type 2   Wears glasses    Wears partial dentures    upper   Past Surgical History:  Procedure Laterality Date   CESAREAN SECTION  yrs ago   CYSTOSCOPY WITH RETROGRADE PYELOGRAM, URETEROSCOPY AND STENT PLACEMENT Left 05/24/2018   Procedure: CYSTOSCOPY WITH RETROGRADE PYELOGRAM, URETEROSCOPY AND STENT PLACEMENT;  Surgeon: Alexis Frock, MD;  Location: [facility];  Service: Urology;  Laterality: Left;   CYSTOSCOPY WITH RETROGRADE PYELOGRAM, URETEROSCOPY AND STENT PLACEMENT Left 02/27/2020   Procedure: CYSTOSCOPY WITH RETROGRADE PYELOGRAM, URETEROSCOPY AND STENT PLACEMENT;  Surgeon: Alexis Frock, MD;  Location: [facility];  Service: Urology;  Laterality: Left;  1 HR   CYSTOSCOPY WITH RETROGRADE PYELOGRAM, URETEROSCOPY AND STENT PLACEMENT Bilateral 10/20/2020   Procedure: CYSTOSCOPY WITH RETROGRADE PYELOGRAM, URETEROSCOPY AND STENT PLACEMENT;   Surgeon: Alexis Frock, MD;  Location: [facility];  Service: Urology;  Laterality: Bilateral;  75 MINS   HOLMIUM LASER APPLICATION Left 11/25/3005   Procedure: HOLMIUM LASER APPLICATION;  Surgeon: Alexis Frock, MD;  Location: [facility];  Service: Urology;  Laterality: Left;   HOLMIUM LASER APPLICATION Left 03/24/2632   Procedure: HOLMIUM LASER APPLICATION;  Surgeon: Alexis Frock, MD;  Location: [facility];  Service: Urology;  Laterality: Left;   HOLMIUM LASER APPLICATION Bilateral 35/45/6256   Procedure: HOLMIUM LASER APPLICATION;  Surgeon: Alexis Frock, MD;  Location: [facility];  Service: Urology;  Laterality: Bilateral;   ROBOTIC ASSITED PARTIAL NEPHRECTOMY Left 12/22/2015   Procedure: XI ROBOTIC ASSITED PARTIAL NEPHRECTOMY;  Surgeon: Alexis Frock, MD;  Location: [facility];  Service: Urology;  Laterality: Left;   TOTAL ABDOMINAL HYSTERECTOMY W/ BILATERAL SALPINGOOPHORECTOMY  1980's   TOTAL KNEE ARTHROPLASTY Left 01/12/2017   Procedure: LEFT TOTAL KNEE ARTHROPLASTY and Right knee steroid injection;  Surgeon: Mcarthur Rossetti, MD;  Location: [facility];  Service: Orthopedics;  Laterality: Left;   TOTAL KNEE ARTHROPLASTY Right 04/14/2022   Procedure: RIGHT TOTAL KNEE ARTHROPLASTY;  Surgeon: Mcarthur Rossetti, MD;  Location: [facility];  Service: Orthopedics;  Laterality: Right;   Patient Active Problem List   Diagnosis Date Noted   Status post total  right knee replacement 04/14/2022   Chest pain 10/09/2020   Nausea 10/09/2020   Hypertension    Type 2 diabetes mellitus ([facility])    GERD (gastroesophageal reflux disease)    Facet degeneration of lumbar region 12/18/2018   Chronic low back pain without sciatica 06/18/2017   Primary osteoarthritis of right knee 03/26/2017   Presence of left artificial knee joint 02/26/2017   Unilateral primary osteoarthritis, left knee 01/12/2017   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/12/2017   Renal mass 12/22/2015    REFERRING DIAG: Z96.651 (ICD-10-CM) -  Status post right knee replacement  THERAPY DIAG:  Stiffness of right knee, not elsewhere classified  Muscle weakness (generalized)  Localized edema  Acute pain of right knee  Other abnormalities of gait and mobility  Unsteadiness on feet  Rationale for Evaluation and Treatment Rehabilitation  ONSET DATE: 04/14/2022 right TKR   SUBJECTIVE:    SUBJECTIVE STATEMENT: She reports knee swelling and tightness. She did a lot of housework today, taking seated breaks more often than she wanted. She has increased fatigue today.  PERTINENT HISTORY: OA, left TKR 2018, left kidney CA with partial nephrectomy 2017, HTN, obesity, DM2, LBP with sciatica RLE   PAIN:  NPRS scale: 0/10 upon arrival Pain location: Rt knee/lower leg Pain description: throbbing, stinging, burning Aggravating factors: increased activity.  Relieving factors: meds, ice, elevation   PRECAUTIONS: None   WEIGHT BEARING RESTRICTIONS No   FALLS:  Has patient fallen in last 6 months? No   LIVING ENVIRONMENT: Lives with: lives with their spouse and lives with their daughter Lives in: House/apartment Stairs: Yes: External: 2 steps; on left going up Has following equipment at home: Single point cane, Walker - 2 wheeled, and built in Civil engineer, contracting   OCCUPATION: retired   PLOF: Independent, Independent with household mobility without device, and Independent with community mobility without device   PATIENT GOALS  walk without device, water aerobics,      OBJECTIVE:    DIAGNOSTIC FINDINGS:  04/14/22 Status post right knee arthroplasty with surgical sutures about the anterior aspect of the knee. Subcutaneous emphysema as expected. No perihardware fracture.   PATIENT SURVEYS:  04/19/2022 FOTO   43% with target 63% 05/10/2022 FOTO   55%   COGNITION: 04/19/2022 Overall cognitive status: Within functional limits for tasks assessed                          SENSATION: 04/19/2022 Light touch: WFL   EDEMA:   04/19/2022 RLE:  above knee 61.3cm around knee  55cm  below knee 43.4cm LLE:  above knee 56.0 cm  around knee 45cm below knee  41cm   POSTURE: 6/28/203:  rounded shoulders, forward head, flexed trunk , and weight shift left   PALPATION: 6/28/203:  tenderness all areas of right knee, redness & warmth laterally and medially.    LOWER EXTREMITY ROM:   ROM P:passive  A:active Right 04/19/22 Right 04/26/22 Right  05/03/22 Right 05/10/22 Right 05/17/22 Right 05/24/22 Right 05/31/22 Right 06/05/22 Right 06/07/22  Hip flexion              Hip extension              Hip abduction              Hip adduction              Hip internal rotation  Hip external rotation              Knee flexion Supine head propped up P: 47* Seated A: 42* Supine AROM heel slide 68 deg Seated P: 77* A: 70* Seated P: 95* A: 90* after manual Seated P: 93* A: 90* After manual Supine P: 94* A: 90* Seated P: 99* A: 93* After manual    Knee extension Supine P: -8* A: -30* SAQ   Seated LAQ A: -19* Supine P: -9* A:-15* After manual  Supine  P: -10* A: -14* Supine P: -6* After manual A: -2 (seated LAQ) Standing A: -9*  Ankle dorsiflexion              Ankle plantarflexion              Ankle inversion              Ankle eversion               (Blank rows = not tested)   LOWER EXTREMITY MMT:   MMT Right 04/19/2022 Left 04/19/2022  Hip flexion      Hip extension      Hip abduction      Hip adduction      Hip internal rotation      Hip external rotation      Knee flexion 2-/5    Knee extension 2-/5    Ankle dorsiflexion      Ankle plantarflexion      Ankle inversion      Ankle eversion       (Blank rows = not tested)   BED MOBILITY:   04/19/2022  Patient requires total manual assist for RLE for sit to supine & supine to sit.   05/05/2022  Patient moves RLE independently for sit to supine & supine to sit, and requires min assistance for raising RLE to elevated heights    GAIT: 05/10/2022:  pt amb in & out of clinic with [facility] safely >250' 05/05/2022:  Household distance SPC use in [place] UE within clinic c SBA to supervision.  < 150 ft   04/28/2022:  FWW ambulation continued in clinic   6/28/203:  Distance walked: 50' Assistive device utilized: Environmental consultant - 2 wheeled Level of assistance: SBA Comments: antalgic gait with decreased stance RLE, right knee flexed in stance with minimal to no increase flexion for swing.      TODAY'S TREATMENT: 06/07/22 Therex:             SciFit bike seat 11 BLEs 8 mins - 3 min at level 1, 5 min at level 1.5 Red band hamstring curls 2 x 10 Lateral heel taps Rt foot on 4" step x 10 reps, heavy BUE support Standing doorway stretch for upright posture 2 x 2 deep breaths hold ea UE and BUE  TherAct:  Modified tandem narrowed BOS with full step, A-P weight shifting pelvis over stance leg bil. mod UE support to no UE support  Walking narrowed BOS with cues for weight shift over stance at black line in //bars x 6, visual cueing for looking at line, at mirror, looking straight ahead with occasional UE support  Walking narrowed BOS over ground with cues for looking ahead and increasing speed x 20'  Manual:  Standing knee extension mobs with belt 2 x 10  06/05/22 Therex: Recumbent bike seat 7 partial rotations x 8 minutes Leg Press 87# bil 2x15; then RLE only 37# 2x15 Forward step up onto 6" step x 10  bil; 1UE support needed progressing no UE support Lateral heel taps with Rt foot on 4" step 2x10 Seated LAQ 5# 2x10; 3 sec hold  Manual: Seated Rt knee flexion PROM to tolerance  Modalities: Supine head elevated 10 mins 34 deg medium compression c R leg in elevation  05/31/22 Therex:  Recumbent bike seat 7 partial rotations x 8 minutes  Leg press 45* double leg 87 lbs 2 x 15, Rt leg 37 lbs 1 x 10, 3s extension hold  Standing gastroc stretch at step 2 x 30s  6" step up and back RLE stance x 10 with BUE support, verbal cueing for knee  bend and eccentric control  6" lateral step up RLE stance x 10 with BUE support, verbal cueing for knee bend and eccentric control  Manual:  Seated Rt knee flexion PROM to tolerable range c distraction/IR mobilization c movement.  Contract relax techniques for Rt knee flexion  Supine Rt knee extension PROM to tolerable range, grade 2-3 mobilizations  Vasopneumatic             Supine head elevated 10 mins 34 deg medium compression c R leg in elevation  PATIENT EDUCATION:  Education details: anatomy/cause of and role of trigger points in pain, gait compensations, benefits of manual, general course of progression after surgery  Person educated: Patient Education method: Explanation, Demonstration, Tactile cues, Verbal cues, and Handouts Education comprehension: verbalized understanding, returned demonstration, verbal cues required, tactile cues required, and needs further education     HOME EXERCISE PROGRAM: Access Code: [place] URL: [email or web address] Date: 05/29/2022 Prepared by: Jamey Reas  Exercises - Supine Heel Slide with Strap  - 2-4 x daily - 7 x weekly - 2 sets - 10 reps - 5 seconds hold - Seated Passive Knee Extension with Weight  - 3 x daily - 7 x weekly - 1 sets - 1 reps - 5 min hold - Seated Knee Flexion Extension AROM   - 2-4 x daily - 7 x weekly - 2 sets - 10 reps - 5 seconds hold - Quad Setting and Stretching  - 2-4 x daily - 7 x weekly - 5-10 sets - 10 reps - prop 5-10 minutes & quad set5 seconds hold - Supine Short Arc Quad  - 2-4 x daily - 7 x weekly - 2 sets - 10 reps - 5 seconds hold - Supine Straight Leg Raises  - 3-5 x daily - 7 x weekly - 4 sets - 5 reps - 3 seconds hold - Seated Quad Set  - 2-4 x daily - 7 x weekly - 2 sets - 10 reps - 5 seconds hold - seated single straight leg raise   - 3-5 x daily - 7 x weekly - 2-3 sets - 10 reps - 5 seconds hold - Sidelying Quad Stretch  - 2-3 x daily - 7 x weekly - 1 sets - 3 reps - 30 seconds hold -  Seated Hamstring Stretch with Strap  - 2-4 x daily - 7 x weekly - 1 sets - 2-3 reps - 30 seconds hold - Standing Lumbar Extension with Counter  - 2-3 x daily - 7 x weekly - 2 sets - 10 reps - 5 seconds hold - Ankle Alphabet in Elevation  - 2-3 x daily - 7 x weekly - 15 minute hold   ASSESSMENT:   CLINICAL IMPRESSION: Today's session focused on improving weight shifting and knee valgus during gait, with visual cues at floor progressing to looking away from visual  cues and she will likely benefit from further gait training to reduce knee valgus and lateral trunk movement. The short term goal of standing active knee extension was met, measuring at -9 degrees and knee extension range appears limited by hip flexor tightness and posture. She continues to benefit form skilled PT to address mobility, strength, balance, and gait deficits.   OBJECTIVE IMPAIRMENTS Abnormal gait, decreased activity tolerance, decreased balance, decreased endurance, decreased knowledge of use of DME, decreased mobility, difficulty walking, decreased ROM, decreased strength, increased edema, impaired flexibility, postural dysfunction, obesity, and pain.    ACTIVITY LIMITATIONS carrying, lifting, bending, sitting, standing, squatting, sleeping, stairs, transfers, bed mobility, and locomotion level   PARTICIPATION LIMITATIONS: meal prep, cleaning, driving, and community activity   PERSONAL FACTORS Age, Fitness, Past/current experiences, and 3+ comorbidities: see PMH  are also affecting patient's functional outcome.    REHAB POTENTIAL: Good   CLINICAL DECISION MAKING: Stable/uncomplicated   EVALUATION COMPLEXITY: Low     GOALS: Goals reviewed with patient? Yes   SHORT TERM GOALS: Target date: 06/09/2022   PROM right knee extension -7* to flexion 96* Baseline: SEE OBJECTIVE DATA Goal status: met 05/31/22   2.  Standing knee extension AROM to -12* Baseline: SEE OBJECTIVE DATA Goal status: met 06/07/22   LONG TERM  GOALS: Target date: 07/07/2022   Patient will improve FOTO score to 63% Baseline: SEE OBJECTIVE DATA Goal status: on going - assessed 05/12/2022   2.  Patient reports right knee pain </= 2/10 with standing & gait activities.  Baseline: SEE OBJECTIVE DATA Goal status: on going - assessed 05/24/2022   3.  Right Knee PROM 0* extension to 100* flexion Baseline: SEE OBJECTIVE DATA Goal status: on going - assessed 05/24/2022   4.  Right Knee AROM seated -3* extension to 90* flexion Baseline: SEE OBJECTIVE DATA Goal status: on going - assessed 05/24/2022   5.  Patient ambulates >300' community distances including negotiating ramps, curbs & stairs with LRAD independently. Baseline: SEE OBJECTIVE DATA Goal status: on going - assessed 05/24/2022     PLAN: PT FREQUENCY: 2-3 x/wk   PT DURATION: 12 weeks   PLANNED INTERVENTIONS: Therapeutic exercises, Therapeutic activity, Neuromuscular re-education, Balance training, Gait training, Patient/Family education, Joint mobilization, Stair training, DME instructions, Aquatic Therapy, Electrical stimulation, Cryotherapy, Moist heat, scar mobilization, Taping, Vasopneumatic device, Manual therapy, and physical performance testing   PLAN FOR NEXT SESSION: progressive eccentric quad and hamstring strengthening, manual techniques for range, vaso to end. Discuss beginning water aerobics for activity  Jana Hakim, SPT 06/07/2022, 4:16 PM  This entire session of physical therapy was performed under the direct supervision of PT signing evaluation /treatment. PT reviewed note and agrees.  Jamey Reas, PT, DPT 06/07/22 4:17 PM

## 2022-06-13 ENCOUNTER — Encounter: Payer: Self-pay | Admitting: Physical Therapy

## 2022-06-13 ENCOUNTER — Ambulatory Visit (INDEPENDENT_AMBULATORY_CARE_PROVIDER_SITE_OTHER): Payer: Medicare (Managed Care) | Admitting: Physical Therapy

## 2022-06-13 DIAGNOSIS — R2689 Other abnormalities of gait and mobility: Secondary | ICD-10-CM

## 2022-06-13 DIAGNOSIS — R600 Localized edema: Secondary | ICD-10-CM | POA: Diagnosis not present

## 2022-06-13 DIAGNOSIS — M25561 Pain in right knee: Secondary | ICD-10-CM | POA: Diagnosis not present

## 2022-06-13 DIAGNOSIS — R2681 Unsteadiness on feet: Secondary | ICD-10-CM

## 2022-06-13 DIAGNOSIS — M25661 Stiffness of right knee, not elsewhere classified: Secondary | ICD-10-CM | POA: Diagnosis not present

## 2022-06-13 DIAGNOSIS — M6281 Muscle weakness (generalized): Secondary | ICD-10-CM

## 2022-06-13 NOTE — Therapy (Signed)
OUTPATIENT PHYSICAL THERAPY TREATMENT NOTE   Patient Name: Debbie Velasquez MRN: [id number] DOB:09/24/1951, 71 y.o., female Today's Date: 06/13/2022  PCP: Nolene Ebbs, MD REFERRING PROVIDER: Mcarthur Rossetti, MD   END OF SESSION:   PT End of Session - 06/13/22 1529     Visit Number 21    Number of Visits 28    Date for PT Re-Evaluation 07/07/22    Authorization Type [employer]    Progress Note Due on Visit 75    PT Start Time 1521    PT Stop Time 1604    PT Time Calculation (min) 43 min    Activity Tolerance Patient tolerated treatment well    Behavior During Therapy WFL for tasks assessed/performed             Past Medical History:  Diagnosis Date   Anemia    Arthritis    Cancer of left kidney ([place])    Left renal mass   GERD (gastroesophageal reflux disease)    History of kidney stones    x3 -remains with one on right.    Hx of seasonal allergies    rare flare ups   Hypertension    Left renal mass    Obesity    Pneumonia    hx of 20 years ago   Right ureteral stone    Type 2 diabetes mellitus ([place])    Type 2   Wears glasses    Wears partial dentures    upper   Past Surgical History:  Procedure Laterality Date   CESAREAN SECTION  yrs ago   CYSTOSCOPY WITH RETROGRADE PYELOGRAM, URETEROSCOPY AND STENT PLACEMENT Left 05/24/2018   Procedure: CYSTOSCOPY WITH RETROGRADE PYELOGRAM, URETEROSCOPY AND STENT PLACEMENT;  Surgeon: Alexis Frock, MD;  Location: [facility];  Service: Urology;  Laterality: Left;   CYSTOSCOPY WITH RETROGRADE PYELOGRAM, URETEROSCOPY AND STENT PLACEMENT Left 02/27/2020   Procedure: CYSTOSCOPY WITH RETROGRADE PYELOGRAM, URETEROSCOPY AND STENT PLACEMENT;  Surgeon: Alexis Frock, MD;  Location: [facility];  Service: Urology;  Laterality: Left;  1 HR   CYSTOSCOPY WITH RETROGRADE PYELOGRAM, URETEROSCOPY AND STENT PLACEMENT Bilateral 10/20/2020   Procedure: CYSTOSCOPY WITH RETROGRADE PYELOGRAM, URETEROSCOPY AND STENT PLACEMENT;   Surgeon: Alexis Frock, MD;  Location: [facility];  Service: Urology;  Laterality: Bilateral;  75 MINS   HOLMIUM LASER APPLICATION Left 0/06/3266   Procedure: HOLMIUM LASER APPLICATION;  Surgeon: Alexis Frock, MD;  Location: [facility];  Service: Urology;  Laterality: Left;   HOLMIUM LASER APPLICATION Left 10/24/4578   Procedure: HOLMIUM LASER APPLICATION;  Surgeon: Alexis Frock, MD;  Location: [facility];  Service: Urology;  Laterality: Left;   HOLMIUM LASER APPLICATION Bilateral 99/83/3825   Procedure: HOLMIUM LASER APPLICATION;  Surgeon: Alexis Frock, MD;  Location: [facility];  Service: Urology;  Laterality: Bilateral;   ROBOTIC ASSITED PARTIAL NEPHRECTOMY Left 12/22/2015   Procedure: XI ROBOTIC ASSITED PARTIAL NEPHRECTOMY;  Surgeon: Alexis Frock, MD;  Location: [facility];  Service: Urology;  Laterality: Left;   TOTAL ABDOMINAL HYSTERECTOMY W/ BILATERAL SALPINGOOPHORECTOMY  1980's   TOTAL KNEE ARTHROPLASTY Left 01/12/2017   Procedure: LEFT TOTAL KNEE ARTHROPLASTY and Right knee steroid injection;  Surgeon: Mcarthur Rossetti, MD;  Location: [facility];  Service: Orthopedics;  Laterality: Left;   TOTAL KNEE ARTHROPLASTY Right 04/14/2022   Procedure: RIGHT TOTAL KNEE ARTHROPLASTY;  Surgeon: Mcarthur Rossetti, MD;  Location: [facility];  Service: Orthopedics;  Laterality: Right;   Patient Active Problem List   Diagnosis Date Noted   Status post total  right knee replacement 04/14/2022   Chest pain 10/09/2020   Nausea 10/09/2020   Hypertension    Type 2 diabetes mellitus ([facility])    GERD (gastroesophageal reflux disease)    Facet degeneration of lumbar region 12/18/2018   Chronic low back pain without sciatica 06/18/2017   Primary osteoarthritis of right knee 03/26/2017   Presence of left artificial knee joint 02/26/2017   Unilateral primary osteoarthritis, left knee 01/12/2017   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/12/2017   Renal mass 12/22/2015    REFERRING DIAG: Z96.651 (ICD-10-CM) -  Status post right knee replacement  THERAPY DIAG:  Stiffness of right knee, not elsewhere classified  Muscle weakness (generalized)  Localized edema  Acute pain of right knee  Other abnormalities of gait and mobility  Unsteadiness on feet  Rationale for Evaluation and Treatment Rehabilitation  ONSET DATE: 04/14/2022 right TKR   SUBJECTIVE:    SUBJECTIVE STATEMENT: She has been doing alright since last appointment. She is continuing to notice lots of edema. She is struggling with the doorway stretch due to some Rt shoulder pain and limitation. She asked about starting water aerobics.  PERTINENT HISTORY: OA, left TKR 2018, left kidney CA with partial nephrectomy 2017, HTN, obesity, DM2, LBP with sciatica RLE   PAIN:  NPRS scale: 0/10 upon arrival Pain location: Rt knee/lower leg Pain description: throbbing, stinging, burning Aggravating factors: increased activity.  Relieving factors: meds, ice, elevation   PRECAUTIONS: None   WEIGHT BEARING RESTRICTIONS No   FALLS:  Has patient fallen in last 6 months? No   LIVING ENVIRONMENT: Lives with: lives with their spouse and lives with their daughter Lives in: House/apartment Stairs: Yes: External: 2 steps; on left going up Has following equipment at home: Single point cane, Walker - 2 wheeled, and built in Civil engineer, contracting   OCCUPATION: retired   PLOF: Independent, Independent with household mobility without device, and Independent with community mobility without device   PATIENT GOALS  walk without device, water aerobics,      OBJECTIVE:    DIAGNOSTIC FINDINGS:  04/14/22 Status post right knee arthroplasty with surgical sutures about the anterior aspect of the knee. Subcutaneous emphysema as expected. No perihardware fracture.   PATIENT SURVEYS:  04/19/2022 FOTO   43% with target 63% 05/10/2022 FOTO   55%   COGNITION: 04/19/2022 Overall cognitive status: Within functional limits for tasks assessed                           SENSATION: 04/19/2022 Light touch: WFL   EDEMA:  04/19/2022 RLE:  above knee 61.3cm around knee  55cm  below knee 43.4cm LLE:  above knee 56.0 cm  around knee 45cm below knee  41cm   POSTURE: 6/28/203:  rounded shoulders, forward head, flexed trunk , and weight shift left   PALPATION: 6/28/203:  tenderness all areas of right knee, redness & warmth laterally and medially.    LOWER EXTREMITY ROM:   ROM P:passive  A:active Right 04/19/22 Right 04/26/22 Right  05/03/22 Right 05/10/22 Right 05/17/22 Right 05/24/22 Right 05/31/22 Right 06/05/22 Right 06/07/22 Right 06/13/22  Hip flexion               Hip extension               Hip abduction               Hip adduction  Hip internal rotation               Hip external rotation               Knee flexion Supine head propped up P: 47* Seated A: 42* Supine AROM heel slide 68 deg Seated P: 77* A: 70* Seated P: 95* A: 90* after manual Seated P: 93* A: 90* After manual Supine P: 94* A: 90* Seated P: 99* A: 93* After manual   Seated P: 93* A: 90*  Knee extension Supine P: -8* A: -30* SAQ   Seated LAQ A: -19* Supine P: -9* A:-15* After manual  Supine  P: -10* A: -14* Supine P: -6* After manual A: -2 (seated LAQ) Standing A: -9*   Ankle dorsiflexion               Ankle plantarflexion               Ankle inversion               Ankle eversion                (Blank rows = not tested)   LOWER EXTREMITY MMT:   MMT Right 04/19/2022 Left 04/19/2022  Hip flexion      Hip extension      Hip abduction      Hip adduction      Hip internal rotation      Hip external rotation      Knee flexion 2-/5    Knee extension 2-/5    Ankle dorsiflexion      Ankle plantarflexion      Ankle inversion      Ankle eversion       (Blank rows = not tested)   BED MOBILITY:   04/19/2022  Patient requires total manual assist for RLE for sit to supine & supine to sit.   05/05/2022  Patient moves RLE independently for sit to  supine & supine to sit, and requires min assistance for raising RLE to elevated heights   GAIT: 05/10/2022:  pt amb in & out of clinic with [facility] safely >250' 05/05/2022:  Household distance SPC use in [place] UE within clinic c SBA to supervision.  < 150 ft   04/28/2022:  FWW ambulation continued in clinic   6/28/203:  Distance walked: 50' Assistive device utilized: Environmental consultant - 2 wheeled Level of assistance: SBA Comments: antalgic gait with decreased stance RLE, right knee flexed in stance with minimal to no increase flexion for swing.      TODAY'S TREATMENT: 06/13/22 Therex:  Recumbent bike seat 7 partial rotations x 8 minutes  Leg extension machine BLE 10# x 8, BLE con RLE hold RLE ecc 5# 2 x 8  Leg curl machine BLE 25# x 8 reps, RLE 15# 2 x 8 reps  Standing doorway hip flexion stretch with scap squeeze 5s hold for upright posture, pt reported no increased shoulder pain  Supine knee flexion gravity assist AAROM x 5  PT educate on gym attendance 1x/week and machine options for mobility and strengthening, starting water aerobics. Pt verbalized understanding   06/07/22 Therex:             SciFit bike seat 11 BLEs 8 mins - 3 min at level 1, 5 min at level 1.5 Red band hamstring curls 2 x 10 Lateral heel taps Rt foot on 4" step x 10 reps, heavy BUE support Standing doorway stretch for upright posture 2 x  2 deep breaths hold ea UE and BUE  TherAct:  Modified tandem narrowed BOS with full step, A-P weight shifting pelvis over stance leg bil. mod UE support to no UE support  Walking narrowed BOS with cues for weight shift over stance at black line in //bars x 6, visual cueing for looking at line, at mirror, looking straight ahead with occasional UE support  Walking narrowed BOS over ground with cues for looking ahead and increasing speed x 20'  Manual:  Standing knee extension mobs with belt 2 x 10  06/05/22 Therex: Recumbent bike seat 7 partial rotations x 8 minutes Leg Press 87# bil 2x15; then  RLE only 37# 2x15 Forward step up onto 6" step x 10 bil; 1UE support needed progressing no UE support Lateral heel taps with Rt foot on 4" step 2x10 Seated LAQ 5# 2x10; 3 sec hold  Manual: Seated Rt knee flexion PROM to tolerance  Modalities: Supine head elevated 10 mins 34 deg medium compression c R leg in elevation   PATIENT EDUCATION:  Education details: anatomy/cause of and role of trigger points in pain, gait compensations, benefits of manual, general course of progression after surgery  Person educated: Patient Education method: Explanation, Demonstration, Tactile cues, Verbal cues, and Handouts Education comprehension: verbalized understanding, returned demonstration, verbal cues required, tactile cues required, and needs further education     HOME EXERCISE PROGRAM: Access Code: [email or web address] URL: [email or web address] Date: 05/29/2022 Prepared by: Jamey Reas  Exercises - Supine Heel Slide with Strap  - 2-4 x daily - 7 x weekly - 2 sets - 10 reps - 5 seconds hold - Seated Passive Knee Extension with Weight  - 3 x daily - 7 x weekly - 1 sets - 1 reps - 5 min hold - Seated Knee Flexion Extension AROM   - 2-4 x daily - 7 x weekly - 2 sets - 10 reps - 5 seconds hold - Quad Setting and Stretching  - 2-4 x daily - 7 x weekly - 5-10 sets - 10 reps - prop 5-10 minutes & quad set5 seconds hold - Supine Short Arc Quad  - 2-4 x daily - 7 x weekly - 2 sets - 10 reps - 5 seconds hold - Supine Straight Leg Raises  - 3-5 x daily - 7 x weekly - 4 sets - 5 reps - 3 seconds hold - Seated Quad Set  - 2-4 x daily - 7 x weekly - 2 sets - 10 reps - 5 seconds hold - seated single straight leg raise   - 3-5 x daily - 7 x weekly - 2-3 sets - 10 reps - 5 seconds hold - Sidelying Quad Stretch  - 2-3 x daily - 7 x weekly - 1 sets - 3 reps - 30 seconds hold - Seated Hamstring Stretch with Strap  - 2-4 x daily - 7 x weekly - 1 sets - 2-3 reps - 30 seconds hold - Standing Lumbar Extension  with Counter  - 2-3 x daily - 7 x weekly - 2 sets - 10 reps - 5 seconds hold - Ankle Alphabet in Elevation  - 2-3 x daily - 7 x weekly - 15 minute hold   ASSESSMENT:   CLINICAL IMPRESSION: She presented with slightly reduced ROM assessed today following leg extension and curl strengthening activity. She was advised to begin water aerobics and using gym equipment 1x/week outside of PT sessions and she is looking forward to beginning water aerobics again. She has begun walking in  the house without an AD occasionally and appears safe to do so with no losses of balance or instability with gait in clinic. She continues to benefit from skilled PT.   OBJECTIVE IMPAIRMENTS Abnormal gait, decreased activity tolerance, decreased balance, decreased endurance, decreased knowledge of use of DME, decreased mobility, difficulty walking, decreased ROM, decreased strength, increased edema, impaired flexibility, postural dysfunction, obesity, and pain.    ACTIVITY LIMITATIONS carrying, lifting, bending, sitting, standing, squatting, sleeping, stairs, transfers, bed mobility, and locomotion level   PARTICIPATION LIMITATIONS: meal prep, cleaning, driving, and community activity   PERSONAL FACTORS Age, Fitness, Past/current experiences, and 3+ comorbidities: see PMH  are also affecting patient's functional outcome.    REHAB POTENTIAL: Good   CLINICAL DECISION MAKING: Stable/uncomplicated   EVALUATION COMPLEXITY: Low     GOALS: Goals reviewed with patient? Yes   SHORT TERM GOALS: Target date: 06/09/2022   PROM right knee extension -7* to flexion 96* Baseline: SEE OBJECTIVE DATA Goal status: met 05/31/22   2.  Standing knee extension AROM to -12* Baseline: SEE OBJECTIVE DATA Goal status: met 06/07/22   LONG TERM GOALS: Target date: 07/07/2022   Patient will improve FOTO score to 63% Baseline: SEE OBJECTIVE DATA Goal status: on going - assessed 05/12/2022   2.  Patient reports right knee pain </= 2/10  with standing & gait activities.  Baseline: SEE OBJECTIVE DATA Goal status: on going - assessed 06/13/2022   3.  Right Knee PROM 0* extension to 100* flexion Baseline: SEE OBJECTIVE DATA Goal status: on going - assessed 06/13/2022   4.  Right Knee AROM seated -3* extension to 90* flexion Baseline: SEE OBJECTIVE DATA Goal status: on going - assessed 06/13/2022   5.  Patient ambulates >300' community distances including negotiating ramps, curbs & stairs with LRAD independently. Baseline: SEE OBJECTIVE DATA Goal status: on going - assessed 06/13/2022     PLAN: PT FREQUENCY: 2-3 x/wk   PT DURATION: 12 weeks   PLANNED INTERVENTIONS: Therapeutic exercises, Therapeutic activity, Neuromuscular re-education, Balance training, Gait training, Patient/Family education, Joint mobilization, Stair training, DME instructions, Aquatic Therapy, Electrical stimulation, Cryotherapy, Moist heat, scar mobilization, Taping, Vasopneumatic device, Manual therapy, and physical performance testing   PLAN FOR NEXT SESSION: continue progressive quad strengthening and step negotiation with reduced UE support, manual or exercise for range, vaso to end  [employer], SPT 06/13/2022, 5:08 PM  This entire session of physical therapy was performed under the direct supervision of PT signing evaluation /treatment. PT reviewed note and agrees.  Jamey Reas, PT, DPT 06/13/22 5:10 PM

## 2022-06-15 ENCOUNTER — Encounter: Payer: Self-pay | Admitting: Rehabilitative and Restorative Service Providers"

## 2022-06-15 ENCOUNTER — Ambulatory Visit (INDEPENDENT_AMBULATORY_CARE_PROVIDER_SITE_OTHER): Payer: Medicare (Managed Care) | Admitting: Rehabilitative and Restorative Service Providers"

## 2022-06-15 DIAGNOSIS — M25561 Pain in right knee: Secondary | ICD-10-CM | POA: Diagnosis not present

## 2022-06-15 DIAGNOSIS — R600 Localized edema: Secondary | ICD-10-CM

## 2022-06-15 DIAGNOSIS — M6281 Muscle weakness (generalized): Secondary | ICD-10-CM | POA: Diagnosis not present

## 2022-06-15 DIAGNOSIS — M25661 Stiffness of right knee, not elsewhere classified: Secondary | ICD-10-CM

## 2022-06-15 DIAGNOSIS — R2689 Other abnormalities of gait and mobility: Secondary | ICD-10-CM

## 2022-06-15 DIAGNOSIS — R2681 Unsteadiness on feet: Secondary | ICD-10-CM

## 2022-06-15 NOTE — Therapy (Signed)
OUTPATIENT PHYSICAL THERAPY TREATMENT NOTE   Patient Name: Debbie Velasquez MRN: [id number] DOB:September 20, 1951, 71 y.o., female Today's Date: 06/15/2022  PCP: Nolene Ebbs, MD REFERRING PROVIDER: Mcarthur Rossetti, MD   END OF SESSION:   PT End of Session - 06/15/22 1514     Visit Number 22    Number of Visits 28    Date for PT Re-Evaluation 07/07/22    Authorization Type [employer]    Progress Note Due on Visit 26    PT Start Time 1510    PT Stop Time 1550    PT Time Calculation (min) 40 min    Activity Tolerance Patient tolerated treatment well    Behavior During Therapy WFL for tasks assessed/performed              Past Medical History:  Diagnosis Date   Anemia    Arthritis    Cancer of left kidney ([place])    Left renal mass   GERD (gastroesophageal reflux disease)    History of kidney stones    x3 -remains with one on right.    Hx of seasonal allergies    rare flare ups   Hypertension    Left renal mass    Obesity    Pneumonia    hx of 20 years ago   Right ureteral stone    Type 2 diabetes mellitus ([place])    Type 2   Wears glasses    Wears partial dentures    upper   Past Surgical History:  Procedure Laterality Date   CESAREAN SECTION  yrs ago   CYSTOSCOPY WITH RETROGRADE PYELOGRAM, URETEROSCOPY AND STENT PLACEMENT Left 05/24/2018   Procedure: CYSTOSCOPY WITH RETROGRADE PYELOGRAM, URETEROSCOPY AND STENT PLACEMENT;  Surgeon: Alexis Frock, MD;  Location: [facility];  Service: Urology;  Laterality: Left;   CYSTOSCOPY WITH RETROGRADE PYELOGRAM, URETEROSCOPY AND STENT PLACEMENT Left 02/27/2020   Procedure: CYSTOSCOPY WITH RETROGRADE PYELOGRAM, URETEROSCOPY AND STENT PLACEMENT;  Surgeon: Alexis Frock, MD;  Location: [facility];  Service: Urology;  Laterality: Left;  1 HR   CYSTOSCOPY WITH RETROGRADE PYELOGRAM, URETEROSCOPY AND STENT PLACEMENT Bilateral 10/20/2020   Procedure: CYSTOSCOPY WITH RETROGRADE PYELOGRAM, URETEROSCOPY AND STENT PLACEMENT;   Surgeon: Alexis Frock, MD;  Location: [facility];  Service: Urology;  Laterality: Bilateral;  75 MINS   HOLMIUM LASER APPLICATION Left 02/25/2562   Procedure: HOLMIUM LASER APPLICATION;  Surgeon: Alexis Frock, MD;  Location: [facility];  Service: Urology;  Laterality: Left;   HOLMIUM LASER APPLICATION Left 05/31/3733   Procedure: HOLMIUM LASER APPLICATION;  Surgeon: Alexis Frock, MD;  Location: [facility];  Service: Urology;  Laterality: Left;   HOLMIUM LASER APPLICATION Bilateral 28/76/8115   Procedure: HOLMIUM LASER APPLICATION;  Surgeon: Alexis Frock, MD;  Location: [facility];  Service: Urology;  Laterality: Bilateral;   ROBOTIC ASSITED PARTIAL NEPHRECTOMY Left 12/22/2015   Procedure: XI ROBOTIC ASSITED PARTIAL NEPHRECTOMY;  Surgeon: Alexis Frock, MD;  Location: [facility];  Service: Urology;  Laterality: Left;   TOTAL ABDOMINAL HYSTERECTOMY W/ BILATERAL SALPINGOOPHORECTOMY  1980's   TOTAL KNEE ARTHROPLASTY Left 01/12/2017   Procedure: LEFT TOTAL KNEE ARTHROPLASTY and Right knee steroid injection;  Surgeon: Mcarthur Rossetti, MD;  Location: [facility];  Service: Orthopedics;  Laterality: Left;   TOTAL KNEE ARTHROPLASTY Right 04/14/2022   Procedure: RIGHT TOTAL KNEE ARTHROPLASTY;  Surgeon: Mcarthur Rossetti, MD;  Location: [facility];  Service: Orthopedics;  Laterality: Right;   Patient Active Problem List   Diagnosis Date Noted   Status post  total right knee replacement 04/14/2022   Chest pain 10/09/2020   Nausea 10/09/2020   Hypertension    Type 2 diabetes mellitus ([facility])    GERD (gastroesophageal reflux disease)    Facet degeneration of lumbar region 12/18/2018   Chronic low back pain without sciatica 06/18/2017   Primary osteoarthritis of right knee 03/26/2017   Presence of left artificial knee joint 02/26/2017   Unilateral primary osteoarthritis, left knee 01/12/2017   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/12/2017   Renal mass 12/22/2015    REFERRING DIAG: Z96.651 (ICD-10-CM) -  Status post right knee replacement  THERAPY DIAG:  Stiffness of right knee, not elsewhere classified  Muscle weakness (generalized)  Localized edema  Acute pain of right knee  Other abnormalities of gait and mobility  Unsteadiness on feet  Rationale for Evaluation and Treatment Rehabilitation  ONSET DATE: 04/14/2022 right TKR   SUBJECTIVE:    SUBJECTIVE STATEMENT: Pt denied any pain upon arrival today.  Reported swelling still a problem.   Pt indicated going out and working in yard some this morning.   PERTINENT HISTORY: OA, left TKR 2018, left kidney CA with partial nephrectomy 2017, HTN, obesity, DM2, LBP with sciatica RLE   PAIN:  NPRS scale: 0/10 upon arrival Pain location: Rt knee/lower leg Pain description:  Aggravating factors:  Relieving factors:    PRECAUTIONS: None   WEIGHT BEARING RESTRICTIONS No   FALLS:  Has patient fallen in last 6 months? No   LIVING ENVIRONMENT: Lives with: lives with their spouse and lives with their daughter Lives in: House/apartment Stairs: Yes: External: 2 steps; on left going up Has following equipment at home: Single point cane, Walker - 2 wheeled, and built in Civil engineer, contracting   OCCUPATION: retired   PLOF: Independent, Independent with household mobility without device, and Independent with community mobility without device   PATIENT GOALS  walk without device, water aerobics,      OBJECTIVE:    DIAGNOSTIC FINDINGS:  04/14/22 Status post right knee arthroplasty with surgical sutures about the anterior aspect of the knee. Subcutaneous emphysema as expected. No perihardware fracture.   PATIENT SURVEYS:  05/10/2022 FOTO   55% 04/19/2022 FOTO   43% with target 63%    COGNITION: 04/19/2022 Overall cognitive status: Within functional limits for tasks assessed                          SENSATION: 04/19/2022 Light touch: WFL   EDEMA:  04/19/2022 RLE:  above knee 61.3cm around knee  55cm  below knee 43.4cm LLE:  above knee  56.0 cm  around knee 45cm below knee  41cm   POSTURE: 6/28/203:  rounded shoulders, forward head, flexed trunk , and weight shift left   PALPATION: 6/28/203:  tenderness all areas of right knee, redness & warmth laterally and medially.    LOWER EXTREMITY ROM:   ROM P:passive  A:active Right 04/19/22 Right 04/26/22 Right  05/03/22 Right 05/10/22 Right 05/17/22 Right 05/24/22 Right 05/31/22 Right 06/05/22 Right 06/07/22 Right 06/13/22 Right 06/15/2022  Hip flexion                Hip extension                Hip abduction                Hip adduction                Hip internal rotation  Hip external rotation                Knee flexion Supine head propped up P: 47* Seated A: 42* Supine AROM heel slide 68 deg Seated P: 77* A: 70* Seated P: 95* A: 90* after manual Seated P: 93* A: 90* After manual Supine P: 94* A: 90* Seated P: 99* A: 93* After manual   Seated P: 93* A: 90* AROM heel slide 96  Knee extension Supine P: -8* A: -30* SAQ   Seated LAQ A: -19* Supine P: -9* A:-15* After manual  Supine  P: -10* A: -14* Supine P: -6* After manual A: -2 (seated LAQ) Standing A: -9*  Seated AROM LAQ -7  Ankle dorsiflexion                Ankle plantarflexion                Ankle inversion                Ankle eversion                 (Blank rows = not tested)   LOWER EXTREMITY MMT:   MMT Right 04/19/2022 Left 06/15/2022 Right 06/15/2022  Hip flexion       Hip extension       Hip abduction       Hip adduction       Hip internal rotation       Hip external rotation       Knee flexion 2-/5 5/5  5/5  Knee extension 2-/5 5/5 46.7, 48 lbs  5/5 43, 41.5 lbs  Ankle dorsiflexion       Ankle plantarflexion       Ankle inversion       Ankle eversion        (Blank rows = not tested)   BED MOBILITY:   04/19/2022  Patient requires total manual assist for RLE for sit to supine & supine to sit.   05/05/2022  Patient moves RLE independently for sit to supine &  supine to sit, and requires min assistance for raising RLE to elevated heights   GAIT: 05/10/2022:  pt amb in & out of clinic with [facility] safely >250' 05/05/2022:  Household distance SPC use in [place] UE within clinic c SBA to supervision.  < 150 ft   04/28/2022:  FWW ambulation continued in clinic   6/28/203:  Distance walked: 50' Assistive device utilized: Environmental consultant - 2 wheeled Level of assistance: SBA Comments: antalgic gait with decreased stance RLE, right knee flexed in stance with minimal to no increase flexion for swing.      TODAY'S TREATMENT: 06/15/22 Therex:  UBE LE only 9 mins lvl 1 , seat 11  Supine heel slide 5 sec hold Rt leg AROM x 10  Rt leg supine Heel prop 3 mins (cues for home use)  Leg press : double leg x15 62 lbs c 2-3 sec pauses in end ranges, Rt leg only 31 lbs 2 x 15 c same pauses (40 sec flexion stretch between sets)  Incline calf stretch 30 sec x 3 bilateral c focus on knee straight  Manual:  Seated Rt knee flexion c distraction/IR mobilization c movement.  Contract/relax for flexion Rt knee     06/13/22 Therex:  Recumbent bike seat 7 partial rotations x 8 minutes  Leg extension machine BLE 10# x 8, BLE con RLE hold RLE ecc 5# 2 x 8  Leg curl machine BLE  25# x 8 reps, RLE 15# 2 x 8 reps  Standing doorway hip flexion stretch with scap squeeze 5s hold for upright posture, pt reported no increased shoulder pain  Supine knee flexion gravity assist AAROM x 5  PT educate on gym attendance 1x/week and machine options for mobility and strengthening, starting water aerobics. Pt verbalized understanding   06/07/22 Therex:             SciFit bike seat 11 BLEs 8 mins - 3 min at level 1, 5 min at level 1.5 Red band hamstring curls 2 x 10 Lateral heel taps Rt foot on 4" step x 10 reps, heavy BUE support Standing doorway stretch for upright posture 2 x 2 deep breaths hold ea UE and BUE  TherAct:  Modified tandem narrowed BOS with full step, A-P weight shifting pelvis over  stance leg bil. mod UE support to no UE support  Walking narrowed BOS with cues for weight shift over stance at black line in //bars x 6, visual cueing for looking at line, at mirror, looking straight ahead with occasional UE support  Walking narrowed BOS over ground with cues for looking ahead and increasing speed x 20'  Manual:  Standing knee extension mobs with belt 2 x 10   PATIENT EDUCATION:  Education details: anatomy/cause of and role of trigger points in pain, gait compensations, benefits of manual, general course of progression after surgery  Person educated: Patient Education method: Explanation, Demonstration, Tactile cues, Verbal cues, and Handouts Education comprehension: verbalized understanding, returned demonstration, verbal cues required, tactile cues required, and needs further education     HOME EXERCISE PROGRAM: Access Code: [place] URL: [email or web address] Date: 05/29/2022 Prepared by: Jamey Reas  Exercises - Supine Heel Slide with Strap  - 2-4 x daily - 7 x weekly - 2 sets - 10 reps - 5 seconds hold - Seated Passive Knee Extension with Weight  - 3 x daily - 7 x weekly - 1 sets - 1 reps - 5 min hold - Seated Knee Flexion Extension AROM   - 2-4 x daily - 7 x weekly - 2 sets - 10 reps - 5 seconds hold - Quad Setting and Stretching  - 2-4 x daily - 7 x weekly - 5-10 sets - 10 reps - prop 5-10 minutes & quad set5 seconds hold - Supine Short Arc Quad  - 2-4 x daily - 7 x weekly - 2 sets - 10 reps - 5 seconds hold - Supine Straight Leg Raises  - 3-5 x daily - 7 x weekly - 4 sets - 5 reps - 3 seconds hold - Seated Quad Set  - 2-4 x daily - 7 x weekly - 2 sets - 10 reps - 5 seconds hold - seated single straight leg raise   - 3-5 x daily - 7 x weekly - 2-3 sets - 10 reps - 5 seconds hold - Sidelying Quad Stretch  - 2-3 x daily - 7 x weekly - 1 sets - 3 reps - 30 seconds hold - Seated Hamstring Stretch with Strap  - 2-4 x daily - 7 x weekly - 1 sets - 2-3  reps - 30 seconds hold - Standing Lumbar Extension with Counter  - 2-3 x daily - 7 x weekly - 2 sets - 10 reps - 5 seconds hold - Ankle Alphabet in Elevation  - 2-3 x daily - 7 x weekly - 15 minute hold   ASSESSMENT:   CLINICAL IMPRESSION: Recheck of measurements showed  good gains in strength in Rt leg compared to Lt leg presentation.  Pt continued to show deficits in both extension and flexion at this time.  Review importance of consistent TKE stretching in supine/sitting heel props as well as consistent end range flexion stretching.    OBJECTIVE IMPAIRMENTS Abnormal gait, decreased activity tolerance, decreased balance, decreased endurance, decreased knowledge of use of DME, decreased mobility, difficulty walking, decreased ROM, decreased strength, increased edema, impaired flexibility, postural dysfunction, obesity, and pain.    ACTIVITY LIMITATIONS carrying, lifting, bending, sitting, standing, squatting, sleeping, stairs, transfers, bed mobility, and locomotion level   PARTICIPATION LIMITATIONS: meal prep, cleaning, driving, and community activity   PERSONAL FACTORS Age, Fitness, Past/current experiences, and 3+ comorbidities: see PMH  are also affecting patient's functional outcome.    REHAB POTENTIAL: Good   CLINICAL DECISION MAKING: Stable/uncomplicated   EVALUATION COMPLEXITY: Low     GOALS: Goals reviewed with patient? Yes   SHORT TERM GOALS: Target date: 06/09/2022   PROM right knee extension -7* to flexion 96* Baseline: SEE OBJECTIVE DATA Goal status: met 05/31/22   2.  Standing knee extension AROM to -12* Baseline: SEE OBJECTIVE DATA Goal status: met 06/07/22   LONG TERM GOALS: Target date: 07/07/2022   Patient will improve FOTO score to 63% Baseline: SEE OBJECTIVE DATA Goal status: on going - assessed 05/12/2022   2.  Patient reports right knee pain </= 2/10 with standing & gait activities.  Baseline: SEE OBJECTIVE DATA Goal status: on going - assessed 06/13/2022    3.  Right Knee PROM 0* extension to 100* flexion Baseline: SEE OBJECTIVE DATA Goal status: on going - assessed 06/13/2022   4.  Right Knee AROM seated -3* extension to 90* flexion Baseline: SEE OBJECTIVE DATA Goal status: on going - assessed 06/13/2022   5.  Patient ambulates >300' community distances including negotiating ramps, curbs & stairs with LRAD independently. Baseline: SEE OBJECTIVE DATA Goal status: on going - assessed 06/13/2022     PLAN: PT FREQUENCY: 2-3 x/wk   PT DURATION: 12 weeks   PLANNED INTERVENTIONS: Therapeutic exercises, Therapeutic activity, Neuromuscular re-education, Balance training, Gait training, Patient/Family education, Joint mobilization, Stair training, DME instructions, Aquatic Therapy, Electrical stimulation, Cryotherapy, Moist heat, scar mobilization, Taping, Vasopneumatic device, Manual therapy, and physical performance testing   PLAN FOR NEXT SESSION: Return to MD office progress note/FOTO.   Scot Jun, PT, DPT, OCS, ATC 06/15/22  3:45 PM

## 2022-06-20 ENCOUNTER — Encounter: Payer: Self-pay | Admitting: Physical Therapy

## 2022-06-20 ENCOUNTER — Ambulatory Visit (INDEPENDENT_AMBULATORY_CARE_PROVIDER_SITE_OTHER): Payer: Medicare (Managed Care) | Admitting: Physical Therapy

## 2022-06-20 DIAGNOSIS — R2689 Other abnormalities of gait and mobility: Secondary | ICD-10-CM

## 2022-06-20 DIAGNOSIS — M25561 Pain in right knee: Secondary | ICD-10-CM

## 2022-06-20 DIAGNOSIS — M25661 Stiffness of right knee, not elsewhere classified: Secondary | ICD-10-CM

## 2022-06-20 DIAGNOSIS — M6281 Muscle weakness (generalized): Secondary | ICD-10-CM | POA: Diagnosis not present

## 2022-06-20 DIAGNOSIS — R600 Localized edema: Secondary | ICD-10-CM | POA: Diagnosis not present

## 2022-06-20 DIAGNOSIS — R2681 Unsteadiness on feet: Secondary | ICD-10-CM

## 2022-06-20 NOTE — Therapy (Signed)
OUTPATIENT PHYSICAL THERAPY TREATMENT NOTE & PROGRESS NOTE   Patient Name: Debbie Velasquez MRN: [id number] DOB:[birth year], 72 y.o., female Today's Date: 06/20/2022  PCP: Nolene Ebbs, MD REFERRING PROVIDER: Mcarthur Rossetti, MD  Progress Note Reporting Period 05/29/22 to 06/20/22  See note below for Objective Data and Assessment of Progress/Goals.    END OF SESSION:   PT End of Session - 06/20/22 1533     Visit Number 23    Number of Visits 28    Date for PT Re-Evaluation 07/07/22    Authorization Type [employer]    Progress Note Due on Visit 39    PT Start Time 1516    PT Stop Time 1604    PT Time Calculation (min) 48 min    Activity Tolerance Patient tolerated treatment well    Behavior During Therapy WFL for tasks assessed/performed             Past Medical History:  Diagnosis Date   Anemia    Arthritis    Cancer of left kidney ([place])    Left renal mass   GERD (gastroesophageal reflux disease)    History of kidney stones    x3 -remains with one on right.    Hx of seasonal allergies    rare flare ups   Hypertension    Left renal mass    Obesity    Pneumonia    hx of 20 years ago   Right ureteral stone    Type 2 diabetes mellitus ([place])    Type 2   Wears glasses    Wears partial dentures    upper   Past Surgical History:  Procedure Laterality Date   CESAREAN SECTION  yrs ago   CYSTOSCOPY WITH RETROGRADE PYELOGRAM, URETEROSCOPY AND STENT PLACEMENT Left 05/24/2018   Procedure: CYSTOSCOPY WITH RETROGRADE PYELOGRAM, URETEROSCOPY AND STENT PLACEMENT;  Surgeon: Alexis Frock, MD;  Location: [facility];  Service: Urology;  Laterality: Left;   CYSTOSCOPY WITH RETROGRADE PYELOGRAM, URETEROSCOPY AND STENT PLACEMENT Left 02/27/2020   Procedure: CYSTOSCOPY WITH RETROGRADE PYELOGRAM, URETEROSCOPY AND STENT PLACEMENT;  Surgeon: Alexis Frock, MD;  Location: [facility];  Service: Urology;  Laterality: Left;  1 HR   CYSTOSCOPY WITH RETROGRADE PYELOGRAM,  URETEROSCOPY AND STENT PLACEMENT Bilateral 10/20/2020   Procedure: CYSTOSCOPY WITH RETROGRADE PYELOGRAM, URETEROSCOPY AND STENT PLACEMENT;  Surgeon: Alexis Frock, MD;  Location: [facility];  Service: Urology;  Laterality: Bilateral;  75 MINS   HOLMIUM LASER APPLICATION Left 06/29/6733   Procedure: HOLMIUM LASER APPLICATION;  Surgeon: Alexis Frock, MD;  Location: [facility];  Service: Urology;  Laterality: Left;   HOLMIUM LASER APPLICATION Left 10/31/3788   Procedure: HOLMIUM LASER APPLICATION;  Surgeon: Alexis Frock, MD;  Location: [facility];  Service: Urology;  Laterality: Left;   HOLMIUM LASER APPLICATION Bilateral 24/06/7352   Procedure: HOLMIUM LASER APPLICATION;  Surgeon: Alexis Frock, MD;  Location: [facility];  Service: Urology;  Laterality: Bilateral;   ROBOTIC ASSITED PARTIAL NEPHRECTOMY Left 12/22/2015   Procedure: XI ROBOTIC ASSITED PARTIAL NEPHRECTOMY;  Surgeon: Alexis Frock, MD;  Location: [facility];  Service: Urology;  Laterality: Left;   TOTAL ABDOMINAL HYSTERECTOMY W/ BILATERAL SALPINGOOPHORECTOMY  1980's   TOTAL KNEE ARTHROPLASTY Left 01/12/2017   Procedure: LEFT TOTAL KNEE ARTHROPLASTY and Right knee steroid injection;  Surgeon: Mcarthur Rossetti, MD;  Location: [facility];  Service: Orthopedics;  Laterality: Left;   TOTAL KNEE ARTHROPLASTY Right 04/14/2022   Procedure: RIGHT TOTAL KNEE ARTHROPLASTY;  Surgeon: Mcarthur Rossetti, MD;  Location: [facility];  Service: Orthopedics;  Laterality: Right;   Patient Active Problem List   Diagnosis Date Noted   Status post total right knee replacement 04/14/2022   Chest pain 10/09/2020   Nausea 10/09/2020   Hypertension    Type 2 diabetes mellitus ([facility])    GERD (gastroesophageal reflux disease)    Facet degeneration of lumbar region 12/18/2018   Chronic low back pain without sciatica 06/18/2017   Primary osteoarthritis of right knee 03/26/2017   Presence of left artificial knee joint 02/26/2017   Unilateral primary osteoarthritis, left  knee 01/12/2017   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/12/2017   Renal mass 12/22/2015    REFERRING DIAG: Z96.651 (ICD-10-CM) - Status post right knee replacement  THERAPY DIAG:  Stiffness of right knee, not elsewhere classified  Muscle weakness (generalized)  Localized edema  Acute pain of right knee  Other abnormalities of gait and mobility  Unsteadiness on feet  Rationale for Evaluation and Treatment Rehabilitation  ONSET DATE: 04/14/2022 right TKR   SUBJECTIVE:    SUBJECTIVE STATEMENT: She has been doing well since last PT appointment. She had a quiet weekend. She took a water aerobics class and was limited by fatigue, then did some biking and tried the knee extension machine with some trouble setting it up. She is going to the [employer] vs [place] game on Thursday and taking a bus to get there.  PERTINENT HISTORY: OA, left TKR 2018, left kidney CA with partial nephrectomy 2017, HTN, obesity, DM2, LBP with sciatica RLE   PAIN:  NPRS scale: 0/10 upon arrival and since last appointment Pain location: Rt knee/lower leg Pain description:  Aggravating factors:  Relieving factors:    PRECAUTIONS: None   WEIGHT BEARING RESTRICTIONS No   FALLS:  Has patient fallen in last 6 months? No   LIVING ENVIRONMENT: Lives with: lives with their spouse and lives with their daughter Lives in: House/apartment Stairs: Yes: External: 2 steps; on left going up Has following equipment at home: Single point cane, Walker - 2 wheeled, and built in Civil engineer, contracting   OCCUPATION: retired   PLOF: Independent, Independent with household mobility without device, and Independent with community mobility without device   PATIENT GOALS  walk without device, water aerobics,      OBJECTIVE:    DIAGNOSTIC FINDINGS:  04/14/22 Status post right knee arthroplasty with surgical sutures about the anterior aspect of the knee. Subcutaneous emphysema as expected. No perihardware fracture.   PATIENT  SURVEYS:  06/20/2022 FOTO   65% 05/10/2022 FOTO   55% 04/19/2022 FOTO   43% with target 63%    COGNITION: 04/19/2022 Overall cognitive status: Within functional limits for tasks assessed                          SENSATION: 04/19/2022 Light touch: WFL   EDEMA:  04/19/2022 RLE:  above knee 61.3cm around knee  55cm  below knee 43.4cm LLE:  above knee 56.0 cm  around knee 45cm below knee  41cm   POSTURE: 6/28/203:  rounded shoulders, forward head, flexed trunk , and weight shift left   PALPATION: 04/19/2022:  tenderness all areas of right knee, redness & warmth laterally and medially.    LOWER EXTREMITY ROM:   ROM P:passive  A:active Right 04/19/22 Right 04/26/22 Right  05/03/22 Right 05/10/22 Right 05/17/22 Right 05/24/22 Right 05/31/22 Right 06/05/22 Right 06/07/22 Right 06/13/22 Right 06/15/2022 Right 06/20/22  Hip flexion  Hip extension                 Hip abduction                 Hip adduction                 Hip internal rotation                 Hip external rotation                 Knee flexion Supine head propped up P: 47* Seated A: 42* Supine AROM heel slide 68 deg Seated P: 77* A: 70* Seated P: 95* A: 90* after manual Seated P: 93* A: 90* After manual Supine P: 94* A: 90* Seated P: 99* A: 93* After manual   Seated P: 93* A: 90* AROM heel slide 96 Seated P: 99* A: 93*  Knee extension Supine P: -8* A: -30* SAQ   Seated LAQ A: -19* Supine P: -9* A:-15* After manual  Supine  P: -10* A: -14* Supine P: -6* After manual A: -2 (seated LAQ) Standing A: -9*  Seated AROM LAQ -7 Seated LAQ A: -14, leaning back A: -10 Supine after manual A: -9*, P: -4*  Ankle dorsiflexion                 Ankle plantarflexion                 Ankle inversion                 Ankle eversion                  (Blank rows = not tested)   LOWER EXTREMITY MMT:   MMT Right 04/19/2022 Left 06/15/2022 Right 06/15/2022  Hip flexion       Hip extension       Hip  abduction       Hip adduction       Hip internal rotation       Hip external rotation       Knee flexion 2-/5 5/5  5/5  Knee extension 2-/5 5/5 46.7, 48 lbs  5/5 43, 41.5 lbs 89% of Lt  Ankle dorsiflexion       Ankle plantarflexion       Ankle inversion       Ankle eversion        (Blank rows = not tested)   BED MOBILITY:   04/19/2022  Patient requires total manual assist for RLE for sit to supine & supine to sit.   05/05/2022  Patient moves RLE independently for sit to supine & supine to sit, and requires min assistance for raising RLE to elevated heights   GAIT: 05/10/2022:  pt amb in & out of clinic with [facility] safely >250' 05/05/2022:  Household distance SPC use in [place] UE within clinic c SBA to supervision.  < 150 ft   04/28/2022:  FWW ambulation continued in clinic   6/28/203:  Distance walked: 50' Assistive device utilized: Environmental consultant - 2 wheeled Level of assistance: SBA Comments: antalgic gait with decreased stance RLE, right knee flexed in stance with minimal to no increase flexion for swing.      TODAY'S TREATMENT: 06/20/22 Therex: UBE LE only 8 mins lvl 2, seat 11 Leg press: double leg x 15 62 lbs c 2-3 sec pauses in end ranges, Rt leg only 31 lbs 2 x 15 c same pauses Seated LAQ with  active knee flexion and contralateral leg motion x 6  Manual:  Knee extension with heel prop PROM to end range with active quad set 5s hold  Self care:  PT verbalized and demo scar mobilization techniques and indications with suggestion for 5-10 minutes 1-2x/day, pt verbalized understanding and returned demo  06/15/22 Therex:  UBE LE only 9 mins lvl 1 , seat 11  Supine heel slide 5 sec hold Rt leg AROM x 10  Rt leg supine Heel prop 3 mins (cues for home use)  Leg press : double leg x15 62 lbs c 2-3 sec pauses in end ranges, Rt leg only 31 lbs 2 x 15 c same pauses (40 sec flexion stretch between sets)  Incline calf stretch 30 sec x 3 bilateral c focus on knee straight  Manual:  Seated Rt  knee flexion c distraction/IR mobilization c movement.  Contract/relax for flexion Rt knee   06/13/22 Therex:  Recumbent bike seat 7 partial rotations x 8 minutes  Leg extension machine BLE 10# x 8, BLE con RLE hold RLE ecc 5# 2 x 8  Leg curl machine BLE 25# x 8 reps, RLE 15# 2 x 8 reps  Standing doorway hip flexion stretch with scap squeeze 5s hold for upright posture, pt reported no increased shoulder pain  Supine knee flexion gravity assist AAROM x 5  PT educate on gym attendance 1x/week and machine options for mobility and strengthening, starting water aerobics. Pt verbalized understanding   06/07/22 Therex:             SciFit bike seat 11 BLEs 8 mins - 3 min at level 1, 5 min at level 1.5 Red band hamstring curls 2 x 10 Lateral heel taps Rt foot on 4" step x 10 reps, heavy BUE support Standing doorway stretch for upright posture 2 x 2 deep breaths hold ea UE and BUE  TherAct:  Modified tandem narrowed BOS with full step, A-P weight shifting pelvis over stance leg bil. mod UE support to no UE support  Walking narrowed BOS with cues for weight shift over stance at black line in //bars x 6, visual cueing for looking at line, at mirror, looking straight ahead with occasional UE support  Walking narrowed BOS over ground with cues for looking ahead and increasing speed x 20'  Manual:  Standing knee extension mobs with belt 2 x 10   PATIENT EDUCATION:  Education details: anatomy/cause of and role of trigger points in pain, gait compensations, benefits of manual, general course of progression after surgery  Person educated: Patient Education method: Explanation, Demonstration, Tactile cues, Verbal cues, and Handouts Education comprehension: verbalized understanding, returned demonstration, verbal cues required, tactile cues required, and needs further education     HOME EXERCISE PROGRAM: Access Code: [place] URL: [email or web address] Date: 05/29/2022 Prepared by:  Jamey Reas  Exercises - Supine Heel Slide with Strap  - 2-4 x daily - 7 x weekly - 2 sets - 10 reps - 5 seconds hold - Seated Passive Knee Extension with Weight  - 3 x daily - 7 x weekly - 1 sets - 1 reps - 5 min hold - Seated Knee Flexion Extension AROM   - 2-4 x daily - 7 x weekly - 2 sets - 10 reps - 5 seconds hold - Quad Setting and Stretching  - 2-4 x daily - 7 x weekly - 5-10 sets - 10 reps - prop 5-10 minutes & quad set5 seconds hold - Supine Short Arc Quad  -  2-4 x daily - 7 x weekly - 2 sets - 10 reps - 5 seconds hold - Supine Straight Leg Raises  - 3-5 x daily - 7 x weekly - 4 sets - 5 reps - 3 seconds hold - Seated Quad Set  - 2-4 x daily - 7 x weekly - 2 sets - 10 reps - 5 seconds hold - seated single straight leg raise   - 3-5 x daily - 7 x weekly - 2-3 sets - 10 reps - 5 seconds hold - Sidelying Quad Stretch  - 2-3 x daily - 7 x weekly - 1 sets - 3 reps - 30 seconds hold - Seated Hamstring Stretch with Strap  - 2-4 x daily - 7 x weekly - 1 sets - 2-3 reps - 30 seconds hold - Standing Lumbar Extension with Counter  - 2-3 x daily - 7 x weekly - 2 sets - 10 reps - 5 seconds hold - Ankle Alphabet in Elevation  - 2-3 x daily - 7 x weekly - 15 minute hold   ASSESSMENT:   CLINICAL IMPRESSION: In this progress note period, she has made large improvements in her functional abilities, reporting the ability to do all housework requirements and meeting the FOTO goal to reflect improved percieved functional ability. Her Rt knee extension strength is 89% of the Lt, showing improved strength. Her range continues to slightly improve between assessments with neither knee motion measurement reaching expected LTG ranges currently. She is likely to meet LTGs within current POC, with possible limitation arising in passive and active extension.   OBJECTIVE IMPAIRMENTS Abnormal gait, decreased activity tolerance, decreased balance, decreased endurance, decreased knowledge of use of DME, decreased  mobility, difficulty walking, decreased ROM, decreased strength, increased edema, impaired flexibility, postural dysfunction, obesity, and pain.    ACTIVITY LIMITATIONS carrying, lifting, bending, sitting, standing, squatting, sleeping, stairs, transfers, bed mobility, and locomotion level   PARTICIPATION LIMITATIONS: meal prep, cleaning, driving, and community activity   PERSONAL FACTORS Age, Fitness, Past/current experiences, and 3+ comorbidities: see PMH  are also affecting patient's functional outcome.    REHAB POTENTIAL: Good   CLINICAL DECISION MAKING: Stable/uncomplicated   EVALUATION COMPLEXITY: Low     GOALS: Goals reviewed with patient? Yes   SHORT TERM GOALS: Target date: 06/09/2022   PROM right knee extension -7* to flexion 96* Baseline: SEE OBJECTIVE DATA Goal status: met 05/31/22   2.  Standing knee extension AROM to -12* Baseline: SEE OBJECTIVE DATA Goal status: met 06/07/22   LONG TERM GOALS: Target date: 07/07/2022   Patient will improve FOTO score to 63% Baseline: SEE OBJECTIVE DATA Goal status: met 06/20/2022   2.  Patient reports right knee pain </= 2/10 with standing & gait activities.  Baseline: SEE OBJECTIVE DATA Goal status: on going - assessed 06/20/2022   3.  Right Knee PROM 0* extension to 100* flexion Baseline: SEE OBJECTIVE DATA Goal status: on going - assessed 06/20/2022   4.  Right Knee AROM seated -3* extension to 90* flexion Baseline: SEE OBJECTIVE DATA Goal status: on going - assessed 06/20/2022   5.  Patient ambulates >300' community distances including negotiating ramps, curbs & stairs with LRAD independently. Baseline: SEE OBJECTIVE DATA Goal status: on going - assessed 06/20/2022     PLAN: PT FREQUENCY: 2-3 x/wk   PT DURATION: 12 weeks   PLANNED INTERVENTIONS: Therapeutic exercises, Therapeutic activity, Neuromuscular re-education, Balance training, Gait training, Patient/Family education, Joint mobilization, Stair training, DME  instructions, Aquatic Therapy, Electrical stimulation, Cryotherapy,  Moist heat, scar mobilization, Taping, Vasopneumatic device, Manual therapy, and physical performance testing   PLAN FOR NEXT SESSION: continue to progress quad strengthening with manual for extension and flexion range  Jana Hakim, SPT 06/20/2022, 4:50 PM  This entire session of physical therapy was performed under the direct supervision of PT signing evaluation /treatment. PT reviewed note and agrees.  Jamey Reas, PT, DPT 06/20/22  4:52 PM

## 2022-06-21 ENCOUNTER — Ambulatory Visit (INDEPENDENT_AMBULATORY_CARE_PROVIDER_SITE_OTHER): Payer: Medicare (Managed Care) | Admitting: Rehabilitative and Restorative Service Providers"

## 2022-06-21 ENCOUNTER — Encounter: Payer: Self-pay | Admitting: Rehabilitative and Restorative Service Providers"

## 2022-06-21 ENCOUNTER — Encounter: Payer: Self-pay | Admitting: Orthopaedic Surgery

## 2022-06-21 ENCOUNTER — Ambulatory Visit (INDEPENDENT_AMBULATORY_CARE_PROVIDER_SITE_OTHER): Payer: Medicare (Managed Care) | Admitting: Orthopaedic Surgery

## 2022-06-21 DIAGNOSIS — M6281 Muscle weakness (generalized): Secondary | ICD-10-CM | POA: Diagnosis not present

## 2022-06-21 DIAGNOSIS — M25661 Stiffness of right knee, not elsewhere classified: Secondary | ICD-10-CM

## 2022-06-21 DIAGNOSIS — R600 Localized edema: Secondary | ICD-10-CM

## 2022-06-21 DIAGNOSIS — M25561 Pain in right knee: Secondary | ICD-10-CM | POA: Diagnosis not present

## 2022-06-21 DIAGNOSIS — Z96651 Presence of right artificial knee joint: Secondary | ICD-10-CM

## 2022-06-21 DIAGNOSIS — R2689 Other abnormalities of gait and mobility: Secondary | ICD-10-CM

## 2022-06-21 DIAGNOSIS — R2681 Unsteadiness on feet: Secondary | ICD-10-CM

## 2022-06-21 NOTE — Therapy (Signed)
OUTPATIENT PHYSICAL THERAPY TREATMENT NOTE    Patient Name: Debbie Velasquez MRN: [id number] DOB:03/10/1951, 71 y.o., female Today's Date: 06/21/2022  PCP: Nolene Ebbs, MD REFERRING PROVIDER: Mcarthur Rossetti, MD   END OF SESSION:   PT End of Session - 06/21/22 1155     Visit Number 24    Number of Visits 28    Date for PT Re-Evaluation 07/07/22    Authorization Type [employer]    Progress Note Due on Visit 33    PT Start Time 1145    PT Stop Time 1226    PT Time Calculation (min) 41 min    Activity Tolerance Patient tolerated treatment well    Behavior During Therapy WFL for tasks assessed/performed              Past Medical History:  Diagnosis Date   Anemia    Arthritis    Cancer of left kidney ([place])    Left renal mass   GERD (gastroesophageal reflux disease)    History of kidney stones    x3 -remains with one on right.    Hx of seasonal allergies    rare flare ups   Hypertension    Left renal mass    Obesity    Pneumonia    hx of 20 years ago   Right ureteral stone    Type 2 diabetes mellitus ([place])    Type 2   Wears glasses    Wears partial dentures    upper   Past Surgical History:  Procedure Laterality Date   CESAREAN SECTION  yrs ago   CYSTOSCOPY WITH RETROGRADE PYELOGRAM, URETEROSCOPY AND STENT PLACEMENT Left 05/24/2018   Procedure: CYSTOSCOPY WITH RETROGRADE PYELOGRAM, URETEROSCOPY AND STENT PLACEMENT;  Surgeon: Alexis Frock, MD;  Location: [facility];  Service: Urology;  Laterality: Left;   CYSTOSCOPY WITH RETROGRADE PYELOGRAM, URETEROSCOPY AND STENT PLACEMENT Left 02/27/2020   Procedure: CYSTOSCOPY WITH RETROGRADE PYELOGRAM, URETEROSCOPY AND STENT PLACEMENT;  Surgeon: Alexis Frock, MD;  Location: [facility];  Service: Urology;  Laterality: Left;  1 HR   CYSTOSCOPY WITH RETROGRADE PYELOGRAM, URETEROSCOPY AND STENT PLACEMENT Bilateral 10/20/2020   Procedure: CYSTOSCOPY WITH RETROGRADE PYELOGRAM, URETEROSCOPY AND STENT PLACEMENT;   Surgeon: Alexis Frock, MD;  Location: [facility];  Service: Urology;  Laterality: Bilateral;  75 MINS   HOLMIUM LASER APPLICATION Left 10/30/8414   Procedure: HOLMIUM LASER APPLICATION;  Surgeon: Alexis Frock, MD;  Location: [facility];  Service: Urology;  Laterality: Left;   HOLMIUM LASER APPLICATION Left 6/0/6301   Procedure: HOLMIUM LASER APPLICATION;  Surgeon: Alexis Frock, MD;  Location: [facility];  Service: Urology;  Laterality: Left;   HOLMIUM LASER APPLICATION Bilateral 60/07/9322   Procedure: HOLMIUM LASER APPLICATION;  Surgeon: Alexis Frock, MD;  Location: [facility];  Service: Urology;  Laterality: Bilateral;   ROBOTIC ASSITED PARTIAL NEPHRECTOMY Left 12/22/2015   Procedure: XI ROBOTIC ASSITED PARTIAL NEPHRECTOMY;  Surgeon: Alexis Frock, MD;  Location: [facility];  Service: Urology;  Laterality: Left;   TOTAL ABDOMINAL HYSTERECTOMY W/ BILATERAL SALPINGOOPHORECTOMY  1980's   TOTAL KNEE ARTHROPLASTY Left 01/12/2017   Procedure: LEFT TOTAL KNEE ARTHROPLASTY and Right knee steroid injection;  Surgeon: Mcarthur Rossetti, MD;  Location: [facility];  Service: Orthopedics;  Laterality: Left;   TOTAL KNEE ARTHROPLASTY Right 04/14/2022   Procedure: RIGHT TOTAL KNEE ARTHROPLASTY;  Surgeon: Mcarthur Rossetti, MD;  Location: [facility];  Service: Orthopedics;  Laterality: Right;   Patient Active Problem List   Diagnosis Date Noted   Status  post total right knee replacement 04/14/2022   Chest pain 10/09/2020   Nausea 10/09/2020   Hypertension    Type 2 diabetes mellitus ([facility])    GERD (gastroesophageal reflux disease)    Facet degeneration of lumbar region 12/18/2018   Chronic low back pain without sciatica 06/18/2017   Primary osteoarthritis of right knee 03/26/2017   Presence of left artificial knee joint 02/26/2017   Unilateral primary osteoarthritis, left knee 01/12/2017   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/12/2017   Renal mass 12/22/2015    REFERRING DIAG: E72.094 (ICD-10-CM) -  Status post right knee replacement  THERAPY DIAG:  Stiffness of right knee, not elsewhere classified  Muscle weakness (generalized)  Localized edema  Acute pain of right knee  Other abnormalities of gait and mobility  Unsteadiness on feet  Rationale for Evaluation and Treatment Rehabilitation  ONSET DATE: 04/14/2022 right TKR   SUBJECTIVE:    SUBJECTIVE STATEMENT: Pt indicated no pain upon arrival, no specific complaints this morning other than tightness.   PERTINENT HISTORY: OA, left TKR 2018, left kidney CA with partial nephrectomy 2017, HTN, obesity, DM2, LBP with sciatica RLE   PAIN:  NPRS scale: 0/10 upon arrival and since last appointment Pain location: Rt knee/lower leg Pain description:  Aggravating factors:  Relieving factors:    PRECAUTIONS: None   WEIGHT BEARING RESTRICTIONS No   FALLS:  Has patient fallen in last 6 months? No   LIVING ENVIRONMENT: Lives with: lives with their spouse and lives with their daughter Lives in: House/apartment Stairs: Yes: External: 2 steps; on left going up Has following equipment at home: Single point cane, Walker - 2 wheeled, and built in Civil engineer, contracting   OCCUPATION: retired   PLOF: Independent, Independent with household mobility without device, and Independent with community mobility without device   PATIENT GOALS  walk without device, water aerobics,      OBJECTIVE:    DIAGNOSTIC FINDINGS:  04/14/22 Status post right knee arthroplasty with surgical sutures about the anterior aspect of the knee. Subcutaneous emphysema as expected. No perihardware fracture.   PATIENT SURVEYS:  06/20/2022 FOTO   65% 05/10/2022 FOTO   55% 04/19/2022 FOTO   43% with target 63%    COGNITION: 04/19/2022 Overall cognitive status: Within functional limits for tasks assessed                          SENSATION: 04/19/2022 Light touch: WFL   EDEMA:  04/19/2022 RLE:  above knee 61.3cm around knee  55cm  below knee 43.4cm LLE:  above knee  56.0 cm  around knee 45cm below knee  41cm   POSTURE: 6/28/203:  rounded shoulders, forward head, flexed trunk , and weight shift left   PALPATION: 04/19/2022:  tenderness all areas of right knee, redness & warmth laterally and medially.    LOWER EXTREMITY ROM:   ROM P:passive  A:active Right 04/19/22 Right 04/26/22 Right  05/03/22 Right 05/10/22 Right 05/17/22 Right 05/24/22 Right 05/31/22 Right 06/05/22 Right 06/07/22 Right 06/13/22 Right 06/15/2022 Right 06/20/22  Hip flexion                 Hip extension                 Hip abduction                 Hip adduction                 Hip internal  rotation                 Hip external rotation                 Knee flexion Supine head propped up P: 47* Seated A: 42* Supine AROM heel slide 68 deg Seated P: 77* A: 70* Seated P: 95* A: 90* after manual Seated P: 93* A: 90* After manual Supine P: 94* A: 90* Seated P: 99* A: 93* After manual   Seated P: 93* A: 90* AROM heel slide 96 Seated P: 99* A: 93*  Knee extension Supine P: -8* A: -30* SAQ   Seated LAQ A: -19* Supine P: -9* A:-15* After manual  Supine  P: -10* A: -14* Supine P: -6* After manual A: -2 (seated LAQ) Standing A: -9*  Seated AROM LAQ -7 Seated LAQ A: -14, leaning back A: -10 Supine after manual A: -9*, P: -4*  Ankle dorsiflexion                 Ankle plantarflexion                 Ankle inversion                 Ankle eversion                  (Blank rows = not tested)   LOWER EXTREMITY MMT:   MMT Right 04/19/2022 Left 06/15/2022 Right 06/15/2022  Hip flexion       Hip extension       Hip abduction       Hip adduction       Hip internal rotation       Hip external rotation       Knee flexion 2-/5 5/5  5/5  Knee extension 2-/5 5/5 46.7, 48 lbs  5/5 43, 41.5 lbs 89% of Lt  Ankle dorsiflexion       Ankle plantarflexion       Ankle inversion       Ankle eversion        (Blank rows = not tested)   BED MOBILITY:   04/19/2022  Patient  requires total manual assist for RLE for sit to supine & supine to sit.   05/05/2022  Patient moves RLE independently for sit to supine & supine to sit, and requires min assistance for raising RLE to elevated heights   GAIT: 05/10/2022:  pt amb in & out of clinic with [facility] safely >250' 05/05/2022:  Household distance SPC use in [place] [facility] within clinic c SBA to supervision.  < 150 ft   04/28/2022:  FWW ambulation continued in clinic   6/28/203:  Distance walked: 50' Assistive device utilized: Environmental consultant - 2 wheeled Level of assistance: SBA Comments: antalgic gait with decreased stance RLE, right knee flexed in stance with minimal to no increase flexion for swing.      TODAY'S TREATMENT: 06/21/2022 Therex: UBE LE only 8 mins lvl 2, seat 11  TherActivity ( to improve walking, stairs, transfers of daily life) Leg press: double leg x 15 62 lbs c 2-3 sec pauses in end ranges, Rt leg only 37 lbs 2 x 15 c same pauses Step on over and down WB on Rt leg 4 inch x 10 with single hand assist Step on over and down WB on Rt leg 6 inch x 5 with single hand assist    Manual:  Rt knee flexion mobilization c movement/distraction for mobility gains  06/20/22 Therex: UBE LE only 8 mins lvl 2, seat 11 Leg press: double leg x 15 62 lbs c 2-3 sec pauses in end ranges, Rt leg only 31 lbs 2 x 15 c same pauses Seated LAQ with active knee flexion and contralateral leg motion x 6  Manual:  Knee extension with heel prop PROM to end range with active quad set 5s hold  Self care:  PT verbalized and demo scar mobilization techniques and indications with suggestion for 5-10 minutes 1-2x/day, pt verbalized understanding and returned demo  06/15/22 Therex:  UBE LE only 9 mins lvl 1 , seat 11  Supine heel slide 5 sec hold Rt leg AROM x 10  Rt leg supine Heel prop 3 mins (cues for home use)  Leg press : double leg x15 62 lbs c 2-3 sec pauses in end ranges, Rt leg only 31 lbs 2 x 15 c same pauses (40 sec flexion stretch  between sets)  Incline calf stretch 30 sec x 3 bilateral c focus on knee straight  Manual:  Seated Rt knee flexion c distraction/IR mobilization c movement.  Contract/relax for flexion Rt knee   06/13/22 Therex:  Recumbent bike seat 7 partial rotations x 8 minutes  Leg extension machine BLE 10# x 8, BLE con RLE hold RLE ecc 5# 2 x 8  Leg curl machine BLE 25# x 8 reps, RLE 15# 2 x 8 reps  Standing doorway hip flexion stretch with scap squeeze 5s hold for upright posture, pt reported no increased shoulder pain  Supine knee flexion gravity assist AAROM x 5  PT educate on gym attendance 1x/week and machine options for mobility and strengthening, starting water aerobics. Pt verbalized understanding     PATIENT EDUCATION:  Education details: anatomy/cause of and role of trigger points in pain, gait compensations, benefits of manual, general course of progression after surgery  Person educated: Patient Education method: Explanation, Demonstration, Tactile cues, Verbal cues, and Handouts Education comprehension: verbalized understanding, returned demonstration, verbal cues required, tactile cues required, and needs further education     HOME EXERCISE PROGRAM: Access Code: [email or web address] URL: [email or web address] Date: 05/29/2022 Prepared by: Jamey Reas  Exercises - Supine Heel Slide with Strap  - 2-4 x daily - 7 x weekly - 2 sets - 10 reps - 5 seconds hold - Seated Passive Knee Extension with Weight  - 3 x daily - 7 x weekly - 1 sets - 1 reps - 5 min hold - Seated Knee Flexion Extension AROM   - 2-4 x daily - 7 x weekly - 2 sets - 10 reps - 5 seconds hold - Quad Setting and Stretching  - 2-4 x daily - 7 x weekly - 5-10 sets - 10 reps - prop 5-10 minutes & quad set5 seconds hold - Supine Short Arc Quad  - 2-4 x daily - 7 x weekly - 2 sets - 10 reps - 5 seconds hold - Supine Straight Leg Raises  - 3-5 x daily - 7 x weekly - 4 sets - 5 reps - 3 seconds hold - Seated Quad Set  -  2-4 x daily - 7 x weekly - 2 sets - 10 reps - 5 seconds hold - seated single straight leg raise   - 3-5 x daily - 7 x weekly - 2-3 sets - 10 reps - 5 seconds hold - Sidelying Quad Stretch  - 2-3 x daily - 7 x weekly - 1 sets - 3 reps - 30 seconds hold - Seated  Hamstring Stretch with Strap  - 2-4 x daily - 7 x weekly - 1 sets - 2-3 reps - 30 seconds hold - Standing Lumbar Extension with Counter  - 2-3 x daily - 7 x weekly - 2 sets - 10 reps - 5 seconds hold - Ankle Alphabet in Elevation  - 2-3 x daily - 7 x weekly - 15 minute hold   ASSESSMENT:   CLINICAL IMPRESSION: Rt knee flexion quality continued to improve in ability to reach her end range.  Fair control on step down 6 inch with single hand rail assist but improving.  No specific pain was reported in testing of movement.     OBJECTIVE IMPAIRMENTS Abnormal gait, decreased activity tolerance, decreased balance, decreased endurance, decreased knowledge of use of DME, decreased mobility, difficulty walking, decreased ROM, decreased strength, increased edema, impaired flexibility, postural dysfunction, obesity, and pain.    ACTIVITY LIMITATIONS carrying, lifting, bending, sitting, standing, squatting, sleeping, stairs, transfers, bed mobility, and locomotion level   PARTICIPATION LIMITATIONS: meal prep, cleaning, driving, and community activity   PERSONAL FACTORS Age, Fitness, Past/current experiences, and 3+ comorbidities: see PMH  are also affecting patient's functional outcome.    REHAB POTENTIAL: Good   CLINICAL DECISION MAKING: Stable/uncomplicated   EVALUATION COMPLEXITY: Low     GOALS: Goals reviewed with patient? Yes   SHORT TERM GOALS: Target date: 06/09/2022   PROM right knee extension -7* to flexion 96* Baseline: SEE OBJECTIVE DATA Goal status: met 05/31/22   2.  Standing knee extension AROM to -12* Baseline: SEE OBJECTIVE DATA Goal status: met 06/07/22   LONG TERM GOALS: Target date: 07/07/2022   Patient will improve  FOTO score to 63% Baseline: SEE OBJECTIVE DATA Goal status: met 06/20/2022   2.  Patient reports right knee pain </= 2/10 with standing & gait activities.  Baseline: SEE OBJECTIVE DATA Goal status: on going - assessed 06/20/2022   3.  Right Knee PROM 0* extension to 100* flexion Baseline: SEE OBJECTIVE DATA Goal status: on going - assessed 06/20/2022   4.  Right Knee AROM seated -3* extension to 90* flexion Baseline: SEE OBJECTIVE DATA Goal status: on going - assessed 06/20/2022   5.  Patient ambulates >300' community distances including negotiating ramps, curbs & stairs with LRAD independently. Baseline: SEE OBJECTIVE DATA Goal status: on going - assessed 06/20/2022     PLAN: PT FREQUENCY: 2-3 x/wk   PT DURATION: 12 weeks   PLANNED INTERVENTIONS: Therapeutic exercises, Therapeutic activity, Neuromuscular re-education, Balance training, Gait training, Patient/Family education, Joint mobilization, Stair training, DME instructions, Aquatic Therapy, Electrical stimulation, Cryotherapy, Moist heat, scar mobilization, Taping, Vasopneumatic device, Manual therapy, and physical performance testing   PLAN FOR NEXT SESSION: Follow up on MD visit information.   Scot Jun, PT, DPT, OCS, ATC 06/21/22  12:35 PM

## 2022-06-21 NOTE — Progress Notes (Signed)
The patient is now 10 weeks status post a right total knee arthroplasty.  She has remote history of a left knee replacement.  She is 71 years old.  She says she is doing well and using a cane but not at home.  She has taken Tylenol as needed and has no concerns.  She would like to get back in the water aerobics.  Her right more recent operative knee lacks full extension by just a few degrees and I can flex her to past 90 degrees.  It feels stable on exam.  There is swelling to be expected.  She is cleared to get back in the pool with water aerobics from my standpoint.  I will see her back in 3 months with a standing AP and lateral of her more recent right operative knee.  All questions and concerns were answered and addressed.

## 2022-06-22 ENCOUNTER — Encounter: Payer: Medicare (Managed Care) | Admitting: Rehabilitative and Restorative Service Providers"

## 2022-06-27 ENCOUNTER — Ambulatory Visit (INDEPENDENT_AMBULATORY_CARE_PROVIDER_SITE_OTHER): Payer: Medicare (Managed Care) | Admitting: Rehabilitative and Restorative Service Providers"

## 2022-06-27 ENCOUNTER — Encounter: Payer: Self-pay | Admitting: Rehabilitative and Restorative Service Providers"

## 2022-06-27 DIAGNOSIS — M25561 Pain in right knee: Secondary | ICD-10-CM | POA: Diagnosis not present

## 2022-06-27 DIAGNOSIS — M25661 Stiffness of right knee, not elsewhere classified: Secondary | ICD-10-CM

## 2022-06-27 DIAGNOSIS — R2689 Other abnormalities of gait and mobility: Secondary | ICD-10-CM

## 2022-06-27 DIAGNOSIS — R600 Localized edema: Secondary | ICD-10-CM

## 2022-06-27 DIAGNOSIS — M6281 Muscle weakness (generalized): Secondary | ICD-10-CM

## 2022-06-27 DIAGNOSIS — R2681 Unsteadiness on feet: Secondary | ICD-10-CM

## 2022-06-27 NOTE — Therapy (Addendum)
OUTPATIENT PHYSICAL THERAPY TREATMENT NOTE /DISCHARGE   Patient Name: Debbie Velasquez MRN: [id number] DOB:[birth year], 71 y.o., female Today's Date: 06/27/2022  PCP: Fleet Contras, MD REFERRING PROVIDER: Kathryne Hitch, MD   END OF SESSION:   PT End of Session - 06/27/22 1522     Visit Number 25    Number of Visits 28    Date for PT Re-Evaluation 07/07/22    Authorization Type [employer]    Progress Note Due on Visit 33    PT Start Time 1514    PT Stop Time 1552    PT Time Calculation (min) 38 min    Activity Tolerance Patient tolerated treatment well    Behavior During Therapy WFL for tasks assessed/performed               Past Medical History:  Diagnosis Date   Anemia    Arthritis    Cancer of left kidney (HCC)    Left renal mass   GERD (gastroesophageal reflux disease)    History of kidney stones    x3 -remains with one on right.    Hx of seasonal allergies    rare flare ups   Hypertension    Left renal mass    Obesity    Pneumonia    hx of 20 years ago   Right ureteral stone    Type 2 diabetes mellitus (HCC)    Type 2   Wears glasses    Wears partial dentures    upper   Past Surgical History:  Procedure Laterality Date   CESAREAN SECTION  yrs ago   CYSTOSCOPY WITH RETROGRADE PYELOGRAM, URETEROSCOPY AND STENT PLACEMENT Left 05/24/2018   Procedure: CYSTOSCOPY WITH RETROGRADE PYELOGRAM, URETEROSCOPY AND STENT PLACEMENT;  Surgeon: Sebastian Ache, MD;  Location: [facility];  Service: Urology;  Laterality: Left;   CYSTOSCOPY WITH RETROGRADE PYELOGRAM, URETEROSCOPY AND STENT PLACEMENT Left 02/27/2020   Procedure: CYSTOSCOPY WITH RETROGRADE PYELOGRAM, URETEROSCOPY AND STENT PLACEMENT;  Surgeon: Sebastian Ache, MD;  Location: [facility];  Service: Urology;  Laterality: Left;  1 HR   CYSTOSCOPY WITH RETROGRADE PYELOGRAM, URETEROSCOPY AND STENT PLACEMENT Bilateral 10/20/2020   Procedure: CYSTOSCOPY WITH RETROGRADE PYELOGRAM, URETEROSCOPY AND STENT  PLACEMENT;  Surgeon: Sebastian Ache, MD;  Location: [facility];  Service: Urology;  Laterality: Bilateral;  75 MINS   HOLMIUM LASER APPLICATION Left 05/24/2018   Procedure: HOLMIUM LASER APPLICATION;  Surgeon: Sebastian Ache, MD;  Location: [facility];  Service: Urology;  Laterality: Left;   HOLMIUM LASER APPLICATION Left 02/27/2020   Procedure: HOLMIUM LASER APPLICATION;  Surgeon: Sebastian Ache, MD;  Location: [facility];  Service: Urology;  Laterality: Left;   HOLMIUM LASER APPLICATION Bilateral 10/20/2020   Procedure: HOLMIUM LASER APPLICATION;  Surgeon: Sebastian Ache, MD;  Location: [facility];  Service: Urology;  Laterality: Bilateral;   ROBOTIC ASSITED PARTIAL NEPHRECTOMY Left 12/22/2015   Procedure: XI ROBOTIC ASSITED PARTIAL NEPHRECTOMY;  Surgeon: Sebastian Ache, MD;  Location: [facility];  Service: Urology;  Laterality: Left;   TOTAL ABDOMINAL HYSTERECTOMY W/ BILATERAL SALPINGOOPHORECTOMY  1980's   TOTAL KNEE ARTHROPLASTY Left 01/12/2017   Procedure: LEFT TOTAL KNEE ARTHROPLASTY and Right knee steroid injection;  Surgeon: Kathryne Hitch, MD;  Location: [facility];  Service: Orthopedics;  Laterality: Left;   TOTAL KNEE ARTHROPLASTY Right 04/14/2022   Procedure: RIGHT TOTAL KNEE ARTHROPLASTY;  Surgeon: Kathryne Hitch, MD;  Location: [facility];  Service: Orthopedics;  Laterality: Right;   Patient Active Problem List   Diagnosis Date Noted  Status post total right knee replacement 04/14/2022   Chest pain 10/09/2020   Nausea 10/09/2020   Hypertension    Type 2 diabetes mellitus (HCC)    GERD (gastroesophageal reflux disease)    Facet degeneration of lumbar region 12/18/2018   Chronic low back pain without sciatica 06/18/2017   Primary osteoarthritis of right knee 03/26/2017   Presence of left artificial knee joint 02/26/2017   Unilateral primary osteoarthritis, left knee 01/12/2017   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/12/2017   Renal mass 12/22/2015    REFERRING DIAG: X54.008  (ICD-10-CM) - Status post right knee replacement  THERAPY DIAG:  Stiffness of right knee, not elsewhere classified  Muscle weakness (generalized)  Localized edema  Acute pain of right knee  Other abnormalities of gait and mobility  Unsteadiness on feet  Rationale for Evaluation and Treatment Rehabilitation  ONSET DATE: 04/14/2022 right TKR   SUBJECTIVE:    SUBJECTIVE STATEMENT: Pt indicated no pain, just tired.  Reported MD allowed return to water activity.  Return to MD in approx. 3 months.   PERTINENT HISTORY: OA, left TKR 2018, left kidney CA with partial nephrectomy 2017, HTN, obesity, DM2, LBP with sciatica RLE   PAIN:  NPRS scale: 0/10 arrival, 2/10 at worst Pain location: Rt knee/lower leg Pain description:  Aggravating factors:  Relieving factors:    PRECAUTIONS: None   WEIGHT BEARING RESTRICTIONS No   FALLS:  Has patient fallen in last 6 months? No   LIVING ENVIRONMENT: Lives with: lives with their spouse and lives with their daughter Lives in: House/apartment Stairs: Yes: External: 2 steps; on left going up Has following equipment at home: Single point cane, Walker - 2 wheeled, and built in Civil engineer, contracting   OCCUPATION: retired   PLOF: Independent, Independent with household mobility without device, and Independent with community mobility without device   PATIENT GOALS  walk without device, water aerobics,      OBJECTIVE:    DIAGNOSTIC FINDINGS:  04/14/22 Status post right knee arthroplasty with surgical sutures about the anterior aspect of the knee. Subcutaneous emphysema as expected. No perihardware fracture.   PATIENT SURVEYS:  06/20/2022 FOTO   65% 05/10/2022 FOTO   55% 04/19/2022 FOTO   43% with target 63%    COGNITION: 04/19/2022 Overall cognitive status: Within functional limits for tasks assessed                          SENSATION: 04/19/2022 Light touch: WFL   EDEMA:  04/19/2022 RLE:  above knee 61.3cm around knee  55cm  below knee  43.4cm LLE:  above knee 56.0 cm  around knee 45cm below knee  41cm   POSTURE: 6/28/203:  rounded shoulders, forward head, flexed trunk , and weight shift left   PALPATION: 04/19/2022:  tenderness all areas of right knee, redness & warmth laterally and medially.    LOWER EXTREMITY ROM:   ROM P:passive  A:active Right 04/19/22 Right 04/26/22 Right  05/03/22 Right 05/10/22 Right 05/17/22 Right 05/24/22 Right 05/31/22 Right 06/05/22 Right 06/07/22 Right 06/13/22 Right 06/15/2022 Right 06/20/22  Hip flexion                 Hip extension                 Hip abduction                 Hip adduction  Hip internal rotation                 Hip external rotation                 Knee flexion Supine head propped up P: 47* Seated A: 42* Supine AROM heel slide 68 deg Seated P: 77* A: 70* Seated P: 95* A: 90* after manual Seated P: 93* A: 90* After manual Supine P: 94* A: 90* Seated P: 99* A: 93* After manual   Seated P: 93* A: 90* AROM heel slide 96 Seated P: 99* A: 93*  Knee extension Supine P: -8* A: -30* SAQ   Seated LAQ A: -19* Supine P: -9* A:-15* After manual  Supine  P: -10* A: -14* Supine P: -6* After manual A: -2 (seated LAQ) Standing A: -9*  Seated AROM LAQ -7 Seated LAQ A: -14, leaning back A: -10 Supine after manual A: -9*, P: -4*  Ankle dorsiflexion                 Ankle plantarflexion                 Ankle inversion                 Ankle eversion                  (Blank rows = not tested)   LOWER EXTREMITY MMT:   MMT Right 04/19/2022 Left 06/15/2022 Right 06/15/2022  Hip flexion       Hip extension       Hip abduction       Hip adduction       Hip internal rotation       Hip external rotation       Knee flexion 2-/5 5/5  5/5  Knee extension 2-/5 5/5 46.7, 48 lbs  5/5 43, 41.5 lbs 89% of Lt  Ankle dorsiflexion       Ankle plantarflexion       Ankle inversion       Ankle eversion        (Blank rows = not tested)   BED MOBILITY:    04/19/2022  Patient requires total manual assist for RLE for sit to supine & supine to sit.   05/05/2022  Patient moves RLE independently for sit to supine & supine to sit, and requires min assistance for raising RLE to elevated heights   GAIT: 06/27/2022: able to ambulate independent in clinic.   05/10/2022:  pt amb in & out of clinic with [facility] safely >250' 05/05/2022:  Household distance SPC use in [place] UE within clinic c SBA to supervision.  < 150 ft   04/28/2022:  FWW ambulation continued in clinic   6/28/203:  Distance walked: 50' Assistive device utilized: Environmental consultant - 2 wheeled Level of assistance: SBA Comments: antalgic gait with decreased stance RLE, right knee flexed in stance with minimal to no increase flexion for swing.      TODAY'S TREATMENT: 06/27/2022 Therex: UBE LE only 10 mins lvl 2, seat 11 Seated quad set 5 sec x 10 bilateral Seated SLR Rt 2 x 10 c slow movement control focus   Review of HEP for trial HEP period. Additional time spent in review.   TherActivity ( to improve walking, stairs, transfers of daily life) Leg press: double leg x 15 62 lbs c 2-3 sec pauses in end ranges, Rt leg only 37 lbs 2 x 15 c same pauses Sit to stand  to sit 18 inch table height no UE assist x 10  06/21/2022 Therex: UBE LE only 8 mins lvl 2, seat 11  TherActivity ( to improve walking, stairs, transfers of daily life) Leg press: double leg x 15 62 lbs c 2-3 sec pauses in end ranges, Rt leg only 37 lbs 2 x 15 c same pauses Step on over and down WB on Rt leg 4 inch x 10 with single hand assist Step on over and down WB on Rt leg 6 inch x 5 with single hand assist    Manual:  Rt knee flexion mobilization c movement/distraction for mobility gains  06/20/22 Therex: UBE LE only 8 mins lvl 2, seat 11 Leg press: double leg x 15 62 lbs c 2-3 sec pauses in end ranges, Rt leg only 31 lbs 2 x 15 c same pauses Seated LAQ with active knee flexion and contralateral leg motion x 6  Manual:  Knee  extension with heel prop PROM to end range with active quad set 5s hold  Self care:  PT verbalized and demo scar mobilization techniques and indications with suggestion for 5-10 minutes 1-2x/day, pt verbalized understanding and returned demo  06/15/22 Therex:  UBE LE only 9 mins lvl 1 , seat 11  Supine heel slide 5 sec hold Rt leg AROM x 10  Rt leg supine Heel prop 3 mins (cues for home use)  Leg press : double leg x15 62 lbs c 2-3 sec pauses in end ranges, Rt leg only 31 lbs 2 x 15 c same pauses (40 sec flexion stretch between sets)  Incline calf stretch 30 sec x 3 bilateral c focus on knee straight  Manual:  Seated Rt knee flexion c distraction/IR mobilization c movement.  Contract/relax for flexion Rt knee     PATIENT EDUCATION:  06/27/2022: HEP review for updates for trial HEP period.  Given to patient in visit with handout, verbal cues.    HOME EXERCISE PROGRAM: Access Code: [email or web address] URL: [email or web address] Date: 06/27/2022 Prepared by: Scot Jun  Exercises - Supine Heel Slide with Strap  - 2-4 x daily - 7 x weekly - 2 sets - 10 reps - 5 seconds hold - Seated Passive Knee Extension with Weight (Mirrored)  - 3 x daily - 7 x weekly - 1 sets - 1 reps - 5 min hold - Seated Hamstring Stretch with Strap  - 2-4 x daily - 7 x weekly - 1 sets - 2-3 reps - 30 seconds hold - Seated Long Arc Quad (Mirrored)  - 3-5 x daily - 7 x weekly - 1 sets - 5-10 reps - 2 hold - Seated Quad Set  - 1-2 x daily - 7 x weekly - 2-3 sets - 10 reps - 5 seconds hold - seated single straight leg raise   - 1-2 x daily - 7 x weekly - 2-3 sets - 10 reps - 5 seconds hold - Sit to Stand  - 1-2 x daily - 7 x weekly - 2-3 sets - 10 reps   ASSESSMENT:   CLINICAL IMPRESSION: Pt had good MD visit report and plan to return to MD in approx. 3 months.  After discussion with patient today, trial HEP plan was agreed upon to promote continued gains in mobility/strength.  Reviewed HEP for knowledge c  cues for performance and routine at home.     OBJECTIVE IMPAIRMENTS Abnormal gait, decreased activity tolerance, decreased balance, decreased endurance, decreased knowledge of use of DME, decreased mobility, difficulty walking, decreased  ROM, decreased strength, increased edema, impaired flexibility, postural dysfunction, obesity, and pain.    ACTIVITY LIMITATIONS carrying, lifting, bending, sitting, standing, squatting, sleeping, stairs, transfers, bed mobility, and locomotion level   PARTICIPATION LIMITATIONS: meal prep, cleaning, driving, and community activity   PERSONAL FACTORS Age, Fitness, Past/current experiences, and 3+ comorbidities: see PMH  are also affecting patient's functional outcome.    REHAB POTENTIAL: Good   CLINICAL DECISION MAKING: Stable/uncomplicated   EVALUATION COMPLEXITY: Low     GOALS: Goals reviewed with patient? Yes   SHORT TERM GOALS: Target date: 06/09/2022   PROM right knee extension -7* to flexion 96* Baseline: SEE OBJECTIVE DATA Goal status: met 05/31/22   2.  Standing knee extension AROM to -12* Baseline: SEE OBJECTIVE DATA Goal status: met 06/07/22   LONG TERM GOALS: Target date: 07/07/2022   Patient will improve FOTO score to 63% Baseline: SEE OBJECTIVE DATA Goal status: met 06/20/2022   2.  Patient reports right knee pain </= 2/10 with standing & gait activities.  Baseline: SEE OBJECTIVE DATA Goal status: on going - assessed 06/20/2022   3.  Right Knee PROM 0* extension to 100* flexion Baseline: SEE OBJECTIVE DATA Goal status: on going - assessed 06/20/2022   4.  Right Knee AROM seated -3* extension to 90* flexion Baseline: SEE OBJECTIVE DATA Goal status: on going - assessed 06/20/2022   5.  Patient ambulates >300' community distances including negotiating ramps, curbs & stairs with LRAD independently. Baseline: SEE OBJECTIVE DATA Goal status: on going - assessed 06/20/2022     PLAN: PT FREQUENCY: 2-3 x/wk   PT DURATION: 12 weeks    PLANNED INTERVENTIONS: Therapeutic exercises, Therapeutic activity, Neuromuscular re-education, Balance training, Gait training, Patient/Family education, Joint mobilization, Stair training, DME instructions, Aquatic Therapy, Electrical stimulation, Cryotherapy, Moist heat, scar mobilization, Taping, Vasopneumatic device, Manual therapy, and physical performance testing   PLAN FOR NEXT SESSION: Trial HEP.  Recert upon return.  Discharge after 30 days inactivity.    Scot Jun, PT, DPT, OCS, ATC 06/27/22  3:53 PM   PHYSICAL THERAPY DISCHARGE SUMMARY  Visits from Start of Care: 25  Current functional level related to goals / functional outcomes: See note   Remaining deficits: See note   Education / Equipment: HEP  Patient goals were  mostly met . Patient is being discharged due to being pleased with the current functional level.  Scot Jun, PT, DPT, OCS, ATC 08/23/22  11:41 AM

## 2022-06-29 ENCOUNTER — Encounter: Payer: Medicare (Managed Care) | Admitting: Rehabilitative and Restorative Service Providers"

## 2022-09-20 ENCOUNTER — Ambulatory Visit (INDEPENDENT_AMBULATORY_CARE_PROVIDER_SITE_OTHER): Payer: Medicare (Managed Care)

## 2022-09-20 ENCOUNTER — Ambulatory Visit (INDEPENDENT_AMBULATORY_CARE_PROVIDER_SITE_OTHER): Payer: Medicare (Managed Care) | Admitting: Orthopaedic Surgery

## 2022-09-20 ENCOUNTER — Encounter: Payer: Self-pay | Admitting: Orthopaedic Surgery

## 2022-09-20 DIAGNOSIS — Z96651 Presence of right artificial knee joint: Secondary | ICD-10-CM | POA: Diagnosis not present

## 2022-09-20 NOTE — Progress Notes (Signed)
The patient is a 71 year old female who is now 5 months status post a right total knee arthroplasty.  She is 5 years out from her left knee that we replaced.  She feels like she is frustrated a little bit in terms of the pain she gets in her knee but overall she seems to be getting around better.  She is walking without assistive device.  On exam both knees move smoothly and fluidly.  Her more recent operative knee is stable on my exam and has good alignment.  There is no redness and incision looks good.  Standing AP and lateral of the right knee shows both knees on the AP view.  There is good alignment overall of both knees and no evidence of loosening or complicating features.  I gave her reassurance that this should improve with time as it did with her left knee.  Will see her in 6 months to see how she is doing overall.  I will have a final standing AP of both knees at that visit but no lateral.

## 2022-12-28 ENCOUNTER — Encounter: Payer: Self-pay | Admitting: Radiology

## 2022-12-29 ENCOUNTER — Other Ambulatory Visit: Payer: Self-pay | Admitting: Internal Medicine

## 2022-12-29 DIAGNOSIS — Z1231 Encounter for screening mammogram for malignant neoplasm of breast: Secondary | ICD-10-CM

## 2023-01-02 ENCOUNTER — Ambulatory Visit
Admission: RE | Admit: 2023-01-02 | Discharge: 2023-01-02 | Disposition: A | Discharge status: Home / Self Care | Payer: Medicare (Managed Care) | Source: Ambulatory Visit | Attending: Internal Medicine | Admitting: Internal Medicine

## 2023-01-02 DIAGNOSIS — Z1231 Encounter for screening mammogram for malignant neoplasm of breast: Secondary | ICD-10-CM

## 2023-01-11 ENCOUNTER — Emergency Department (HOSPITAL_BASED_OUTPATIENT_CLINIC_OR_DEPARTMENT_OTHER): Payer: Medicare (Managed Care)

## 2023-01-11 ENCOUNTER — Encounter (HOSPITAL_BASED_OUTPATIENT_CLINIC_OR_DEPARTMENT_OTHER): Payer: Self-pay | Admitting: Emergency Medicine

## 2023-01-11 ENCOUNTER — Other Ambulatory Visit: Payer: Self-pay

## 2023-01-11 ENCOUNTER — Emergency Department (HOSPITAL_BASED_OUTPATIENT_CLINIC_OR_DEPARTMENT_OTHER): Payer: Medicare (Managed Care) | Admitting: Radiology

## 2023-01-11 ENCOUNTER — Other Ambulatory Visit (HOSPITAL_BASED_OUTPATIENT_CLINIC_OR_DEPARTMENT_OTHER): Payer: Self-pay

## 2023-01-11 ENCOUNTER — Emergency Department (HOSPITAL_BASED_OUTPATIENT_CLINIC_OR_DEPARTMENT_OTHER)
Admission: EM | Admit: 2023-01-11 | Discharge: 2023-01-11 | Disposition: A | Discharge status: Home / Self Care | Payer: Medicare (Managed Care) | Attending: Emergency Medicine | Admitting: Emergency Medicine

## 2023-01-11 DIAGNOSIS — Z85528 Personal history of other malignant neoplasm of kidney: Secondary | ICD-10-CM | POA: Diagnosis not present

## 2023-01-11 DIAGNOSIS — I10 Essential (primary) hypertension: Secondary | ICD-10-CM | POA: Diagnosis not present

## 2023-01-11 DIAGNOSIS — E119 Type 2 diabetes mellitus without complications: Secondary | ICD-10-CM | POA: Insufficient documentation

## 2023-01-11 DIAGNOSIS — Z96653 Presence of artificial knee joint, bilateral: Secondary | ICD-10-CM | POA: Insufficient documentation

## 2023-01-11 DIAGNOSIS — G8929 Other chronic pain: Secondary | ICD-10-CM | POA: Insufficient documentation

## 2023-01-11 DIAGNOSIS — Z79899 Other long term (current) drug therapy: Secondary | ICD-10-CM | POA: Diagnosis not present

## 2023-01-11 DIAGNOSIS — R109 Unspecified abdominal pain: Secondary | ICD-10-CM

## 2023-01-11 DIAGNOSIS — Z7982 Long term (current) use of aspirin: Secondary | ICD-10-CM | POA: Insufficient documentation

## 2023-01-11 DIAGNOSIS — N200 Calculus of kidney: Secondary | ICD-10-CM | POA: Diagnosis not present

## 2023-01-11 DIAGNOSIS — M545 Low back pain, unspecified: Secondary | ICD-10-CM | POA: Diagnosis present

## 2023-01-11 LAB — URINALYSIS, ROUTINE W REFLEX MICROSCOPIC
Bilirubin Urine: NEGATIVE
Glucose, UA: NEGATIVE mg/dL
Hgb urine dipstick: NEGATIVE
Ketones, ur: NEGATIVE mg/dL
Leukocytes,Ua: NEGATIVE
Nitrite: NEGATIVE
Protein, ur: NEGATIVE mg/dL
Specific Gravity, Urine: 1.018 (ref 1.005–1.030)
pH: 5 (ref 5.0–8.0)

## 2023-01-11 LAB — CBC WITH DIFFERENTIAL/PLATELET
Abs Immature Granulocytes: 0.03 10*3/uL (ref 0.00–0.07)
Basophils Absolute: 0 10*3/uL (ref 0.0–0.1)
Basophils Relative: 0 %
Eosinophils Absolute: 0.4 10*3/uL (ref 0.0–0.5)
Eosinophils Relative: 4 %
HCT: 32.2 % — ABNORMAL LOW (ref 36.0–46.0)
Hemoglobin: 10.3 g/dL — ABNORMAL LOW (ref 12.0–15.0)
Immature Granulocytes: 0 %
Lymphocytes Relative: 23 %
Lymphs Abs: 2.1 10*3/uL (ref 0.7–4.0)
MCH: 28.1 pg (ref 26.0–34.0)
MCHC: 32 g/dL (ref 30.0–36.0)
MCV: 87.7 fL (ref 80.0–100.0)
Monocytes Absolute: 0.5 10*3/uL (ref 0.1–1.0)
Monocytes Relative: 6 %
Neutro Abs: 6.2 10*3/uL (ref 1.7–7.7)
Neutrophils Relative %: 67 %
Platelets: 351 10*3/uL (ref 150–400)
RBC: 3.67 MIL/uL — ABNORMAL LOW (ref 3.87–5.11)
RDW: 14.3 % (ref 11.5–15.5)
WBC: 9.2 10*3/uL (ref 4.0–10.5)
nRBC: 0 % (ref 0.0–0.2)

## 2023-01-11 LAB — COMPREHENSIVE METABOLIC PANEL
ALT: 9 U/L (ref 0–44)
AST: 15 U/L (ref 15–41)
Albumin: 4 g/dL (ref 3.5–5.0)
Alkaline Phosphatase: 76 U/L (ref 38–126)
Anion gap: 8 (ref 5–15)
BUN: 29 mg/dL — ABNORMAL HIGH (ref 8–23)
CO2: 28 mmol/L (ref 22–32)
Calcium: 10 mg/dL (ref 8.9–10.3)
Chloride: 103 mmol/L (ref 98–111)
Creatinine, Ser: 1.25 mg/dL — ABNORMAL HIGH (ref 0.44–1.00)
GFR, Estimated: 46 mL/min — ABNORMAL LOW (ref >60)
Glucose, Bld: 106 mg/dL — ABNORMAL HIGH (ref 70–99)
Potassium: 4.2 mmol/L (ref 3.5–5.1)
Sodium: 139 mmol/L (ref 135–145)
Total Bilirubin: 0.4 mg/dL (ref 0.3–1.2)
Total Protein: 7.8 g/dL (ref 6.5–8.1)

## 2023-01-11 MED ORDER — IOHEXOL 300 MG/ML  SOLN
100.0000 mL | Freq: Once | INTRAMUSCULAR | Status: AC | PRN
  Administered 2023-01-11: 85 mL via INTRAVENOUS

## 2023-01-11 NOTE — ED Notes (Signed)
Report given to the following RN.Marland KitchenMarland Kitchen

## 2023-01-11 NOTE — ED Triage Notes (Signed)
Pt arrives to ED with c/o fall x1 month ago. She notes she feel on her right side. She notes right sided knee and hip pain since the fall. She notes over the past 2-3 days the pain in both sites has worsened. Hx bilateral knee replacement. Pain notes as pressure and soreness.

## 2023-01-11 NOTE — Discharge Instructions (Addendum)
It was a pleasure caring for you today in the emergency department.  Please return to the emergency department for any worsening or worrisome symptoms.  Please follow up with your PCP and orthopedic specialist  You have small kidney stone on the right, it should pass spontaneously. Please follow up with pcp in regards to this.

## 2023-01-11 NOTE — ED Provider Notes (Signed)
[facility] Provider Note  CSN: [id number] Arrival date & time: 01/11/23 [id number]  Chief Complaint(s) Fall, Hip Pain, and Knee Pain  HPI JANNEY HENLY is a 72 y.o. female with past medical history as below, significant for renal cancer left, GERD, kidney stones, DM2, HTN, obesity who presents to the ED with complaint of right sided pain. Had a fall 3 wks ago, pain to right hip and right low back worsened over last 2-3 days. No N/v, no fever or chills, no change to urine/bowel habits. No medication changes or diet changes, no further falls. Pain primarily to right low lumbar region, not midline, right inguinal, right knee. No fever.   Past Medical History Past Medical History:  Diagnosis Date   Anemia    Arthritis    Cancer of left kidney ([place])    Left renal mass   GERD (gastroesophageal reflux disease)    History of kidney stones    x3 -remains with one on right.    Hx of seasonal allergies    rare flare ups   Hypertension    Left renal mass    Obesity    Pneumonia    hx of 20 years ago   Right ureteral stone    Type 2 diabetes mellitus ([place])    Type 2   Wears glasses    Wears partial dentures    upper   Patient Active Problem List   Diagnosis Date Noted   Status post total right knee replacement 04/14/2022   Chest pain 10/09/2020   Nausea 10/09/2020   Hypertension    Type 2 diabetes mellitus (HCC)    GERD (gastroesophageal reflux disease)    Facet degeneration of lumbar region 12/18/2018   Chronic low back pain without sciatica 06/18/2017   Primary osteoarthritis of right knee 03/26/2017   Presence of left artificial knee joint 02/26/2017   Unilateral primary osteoarthritis, left knee 01/12/2017   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/12/2017   Renal mass 12/22/2015   Home Medication(s) Prior to Admission medications   Medication Sig Start Date End Date Taking? Authorizing Provider  allopurinol (ZYLOPRIM) 300  MG tablet Take 300 mg by mouth daily. 07/20/21   [provider]  aspirin 81 MG chewable tablet Chew 1 tablet (81 mg total) by mouth 2 (two) times daily. 04/15/22   Mcarthur Rossetti, MD  losartan-hydrochlorothiazide (HYZAAR) 100-25 MG tablet Take 1 tablet by mouth daily. 03/16/20   [provider]  metFORMIN (GLUCOPHAGE) 500 MG tablet Take 500 mg by mouth daily with breakfast.     [provider]  methocarbamol (ROBAXIN) 500 MG tablet Take 1 tablet (500 mg total) by mouth every 6 (six) hours as needed for muscle spasms. 04/15/22   Mcarthur Rossetti, MD  oxyCODONE (OXY IR/ROXICODONE) 5 MG immediate release tablet Take 1-2 tablets (5-10 mg total) by mouth every 6 (six) hours as needed for moderate pain (pain score 4-6). 05/24/22   Mcarthur Rossetti, MD  rosuvastatin (CRESTOR) 10 MG tablet Take 10 mg by mouth every evening.    [provider]  Turmeric 400 MG CAPS Take 400 mg by mouth daily.    [provider]  Vitamin D, Ergocalciferol, (DRISDOL) 50000 units CAPS capsule Take 50,000 Units by mouth every Thursday. 08/06/17   [provider]  Past Surgical History Past Surgical History:  Procedure Laterality Date   CESAREAN SECTION  yrs ago   CYSTOSCOPY WITH RETROGRADE PYELOGRAM, URETEROSCOPY AND STENT PLACEMENT Left 05/24/2018   Procedure: CYSTOSCOPY WITH RETROGRADE PYELOGRAM, URETEROSCOPY AND STENT PLACEMENT;  Surgeon: Alexis Frock, MD;  Location: [facility];  Service: Urology;  Laterality: Left;   CYSTOSCOPY WITH RETROGRADE PYELOGRAM, URETEROSCOPY AND STENT PLACEMENT Left 02/27/2020   Procedure: CYSTOSCOPY WITH RETROGRADE PYELOGRAM, URETEROSCOPY AND STENT PLACEMENT;  Surgeon: Alexis Frock, MD;  Location: [facility];  Service: Urology;  Laterality: Left;  1 HR   CYSTOSCOPY WITH RETROGRADE PYELOGRAM, URETEROSCOPY  AND STENT PLACEMENT Bilateral 10/20/2020   Procedure: CYSTOSCOPY WITH RETROGRADE PYELOGRAM, URETEROSCOPY AND STENT PLACEMENT;  Surgeon: Alexis Frock, MD;  Location: [facility];  Service: Urology;  Laterality: Bilateral;  75 MINS   HOLMIUM LASER APPLICATION Left [id number]   Procedure: HOLMIUM LASER APPLICATION;  Surgeon: Alexis Frock, MD;  Location: [facility];  Service: Urology;  Laterality: Left;   HOLMIUM LASER APPLICATION Left [id number]   Procedure: HOLMIUM LASER APPLICATION;  Surgeon: Alexis Frock, MD;  Location: [facility];  Service: Urology;  Laterality: Left;   HOLMIUM LASER APPLICATION Bilateral [id number]   Procedure: HOLMIUM LASER APPLICATION;  Surgeon: Alexis Frock, MD;  Location: [facility];  Service: Urology;  Laterality: Bilateral;   ROBOTIC ASSITED PARTIAL NEPHRECTOMY Left 12/22/2015   Procedure: XI ROBOTIC ASSITED PARTIAL NEPHRECTOMY;  Surgeon: Alexis Frock, MD;  Location: [facility];  Service: Urology;  Laterality: Left;   TOTAL ABDOMINAL HYSTERECTOMY W/ BILATERAL SALPINGOOPHORECTOMY  1980's   TOTAL KNEE ARTHROPLASTY Left 01/12/2017   Procedure: LEFT TOTAL KNEE ARTHROPLASTY and Right knee steroid injection;  Surgeon: Mcarthur Rossetti, MD;  Location: [facility];  Service: Orthopedics;  Laterality: Left;   TOTAL KNEE ARTHROPLASTY Right 04/14/2022   Procedure: RIGHT TOTAL KNEE ARTHROPLASTY;  Surgeon: Mcarthur Rossetti, MD;  Location: [facility];  Service: Orthopedics;  Laterality: Right;   Family History Family History  Problem Relation Age of Onset   Esophageal cancer Maternal Aunt    Colon cancer Neg Hx    Stomach cancer Neg Hx    Rectal cancer Neg Hx     Social History Social History   Tobacco Use   Smoking status: Never   Smokeless tobacco: Never  Vaping Use   Vaping Use: Never used  Substance Use Topics   Alcohol use: No    Alcohol/week: 0.0 standard drinks of alcohol   Drug use: No   Allergies Patient has no known allergies.  Review of Systems Review of Systems   Constitutional:  Negative for chills and fever.  HENT:  Negative for facial swelling and trouble swallowing.   Eyes:  Negative for photophobia and visual disturbance.  Respiratory:  Negative for cough and shortness of breath.   Cardiovascular:  Negative for chest pain and palpitations.  Gastrointestinal:  Negative for abdominal pain, nausea and vomiting.  Endocrine: Negative for polydipsia and polyuria.  Genitourinary:  Negative for difficulty urinating and hematuria.  Musculoskeletal:  Positive for arthralgias and back pain. Negative for gait problem and joint swelling.  Skin:  Negative for pallor and rash.  Neurological:  Negative for syncope and headaches.  Psychiatric/Behavioral:  Negative for agitation and confusion.     Physical Exam Vital Signs  I have reviewed the triage vital signs BP (!) 152/71 (BP Location: Left Wrist)   Pulse 66   Temp 98.1 F (36.7 C) (Oral)   Resp 20   Ht 5\' 4"  (1.626 m)   Wt  121.1 kg   SpO2 100%   BMI 45.83 kg/m  Physical Exam  ED Results and Treatments Labs (all labs ordered are listed, but only abnormal results are displayed) Labs Reviewed  URINALYSIS, ROUTINE W REFLEX MICROSCOPIC - Abnormal; Notable for the following components:      Result Value   Color, Urine COLORLESS (*)    All other components within normal limits  CBC WITH DIFFERENTIAL/PLATELET - Abnormal; Notable for the following components:   RBC 3.67 (*)    Hemoglobin 10.3 (*)    HCT 32.2 (*)    All other components within normal limits  COMPREHENSIVE METABOLIC PANEL - Abnormal; Notable for the following components:   Glucose, Bld 106 (*)    BUN 29 (*)    Creatinine, Ser 1.25 (*)    GFR, Estimated 46 (*)    All other components within normal limits                                                                                                                          Radiology CT L-SPINE NO CHARGE  Result Date: 01/11/2023 CLINICAL DATA:  Fall with hip and knee pain.  EXAM: CT LUMBAR SPINE WITHOUT CONTRAST TECHNIQUE: Multidetector CT imaging of the lumbar spine was performed without intravenous contrast administration. Multiplanar CT image reconstructions were also generated. RADIATION DOSE REDUCTION: This exam was performed according to the departmental dose-optimization program which includes automated exposure control, adjustment of the mA and/or kV according to patient size and/or use of iterative reconstruction technique. COMPARISON:  MRI lumbar spine 11/27/2018. FINDINGS: Segmentation: Conventional numbering is assumed with 5 non-rib-bearing, lumbar type vertebral bodies. Alignment: Normal. Vertebrae: No acute fracture. Normal vertebral body heights. No suspicious bone lesions. Paraspinal and other soft tissues: Please refer to same-day CT abdomen/pelvis report. Disc levels: No spinal canal stenosis or significant neural foraminal narrowing. Multilevel moderate to severe facet arthropathy, unchanged from prior. Moderate degenerative changes of the bilateral sacroiliac joints. IMPRESSION: 1. No acute fracture or traumatic malalignment of the lumbar spine. 2. Multilevel moderate to severe facet arthropathy, unchanged from prior. Electronically Signed   By: Emmit Alexanders M.D.   On: 01/11/2023 11:49   CT ABDOMEN PELVIS W CONTRAST  Result Date: 01/11/2023 CLINICAL DATA:  Right lower quadrant abdominal pain EXAM: CT ABDOMEN AND PELVIS WITH CONTRAST TECHNIQUE: Multidetector CT imaging of the abdomen and pelvis was performed using the standard protocol following bolus administration of intravenous contrast. RADIATION DOSE REDUCTION: This exam was performed according to the departmental dose-optimization program which includes automated exposure control, adjustment of the mA and/or kV according to patient size and/or use of iterative reconstruction technique. CONTRAST:  27mL OMNIPAQUE IOHEXOL 300 MG/ML  SOLN COMPARISON:  10/07/2020, 09/08/2016 FINDINGS: Lower chest: No acute  abnormality. Hepatobiliary: Subtly nodular hepatic surface contour. Stable 1.8 cm intermediate density lesion in the left hepatic lobe, previously characterized as a hemangioma. No new focal liver abnormality. Unremarkable gallbladder. No hyperdense gallstone. No biliary dilatation. Pancreas: Unremarkable. No  pancreatic ductal dilatation or surrounding inflammatory changes. Spleen: Normal in size without focal abnormality. Adrenals/Urinary Tract: Unremarkable adrenal glands. Solid, enhancing mass at the medial aspect of the right kidney measuring 2.4 cm, not appreciably changed from the previous study (series 5, image 64). Prior partial left nephrectomy. Punctate nonobstructing stone within the lower pole of the right kidney. No hydronephrosis. No ureteral calculi. Urinary bladder is unremarkable. Stomach/Bowel: Small hiatal hernia. Stomach is otherwise within normal limits. No evidence of appendicitis. Scattered colonic diverticulosis. No evidence of bowel wall thickening, distention, or inflammatory changes. Vascular/Lymphatic: No significant vascular findings are present. No enlarged abdominal or pelvic lymph nodes. Reproductive: Status post hysterectomy. No adnexal masses. Other: No ascites. No pneumoperitoneum. Rectus diastasis with multiple abdominal wall hernias containing only fat. Musculoskeletal: No acute or significant osseous findings. Severe degenerative changes of both hips. IMPRESSION: 1. No acute intra-abdominal or pelvic process. 2. Solid, enhancing 2.4 cm mass at the medial aspect of the right kidney, not appreciably changed from the previous study. Findings remain concerning for renal cell carcinoma. 3. Punctate nonobstructing stone within the lower pole of the right kidney. 4. Subtly nodular hepatic surface contour, suggestive of cirrhosis. 5. Rectus diastasis with multiple abdominal wall hernias containing only fat. Electronically Signed   By: Davina Poke D.O.   On: 01/11/2023 11:46   DG  Knee Complete 4 Views Right  Result Date: 01/11/2023 CLINICAL DATA:  Fall, right knee pain EXAM: RIGHT KNEE - COMPLETE 4+ VIEW COMPARISON:  09/20/2022 FINDINGS: Status post right total knee arthroplasty. Arthroplasty components are in their expected alignment without periprosthetic lucency or fracture. No sizable joint effusion. Soft tissues within normal limits. IMPRESSION: Negative. Electronically Signed   By: Davina Poke D.O.   On: 01/11/2023 09:31   DG Hip Unilat W or Wo Pelvis 2-3 Views Right  Result Date: 01/11/2023 CLINICAL DATA:  Right hip pain after fall 1 month ago EXAM: DG HIP (WITH OR WITHOUT PELVIS) 2-3V RIGHT COMPARISON:  10/07/2020 FINDINGS: There is no evidence of hip fracture or dislocation. 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 No appreciable soft tissue abnormality. IMPRESSION: 1. No acute fracture or dislocation. 2. 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 Electronically Signed   By: Davina Poke D.O.   On: 01/11/2023 09:30    Pertinent labs & imaging results that were available during my care of the patient were reviewed by me and considered in my medical decision making (see MDM for details).  Medications Ordered in ED Medications  iohexol (OMNIPAQUE) 300 MG/ML solution 100 mL (85 mLs Intravenous Contrast Given 01/11/23 1104)                                                                                                                                     Procedures Procedures  (including critical care time)  Medical Decision Making / ED Course  Medical Decision Making:    FARHEEN BLATT is a 72 y.o. female  with past medical history as below, significant for renal cancer left, GERD, kidney stones, DM2, HTN, obesity who presents to the ED with complaint of right sided pain. . The complaint involves an extensive differential diagnosis and also carries  with it a high risk of complications and morbidity.  Serious etiology was considered. Ddx includes but is not limited to: Differential diagnosis includes but is not exclusive to musculoskeletal back pain, renal colic, urinary tract infection, pyelonephritis, intra-abdominal causes of back pain, aortic aneurysm or dissection, cauda equina syndrome, sciatica, lumbar disc disease, thoracic disc disease, etc. Differential diagnosis includes but is not exclusive to ectopic pregnancy, ovarian cyst, ovarian torsion, acute appendicitis, urinary tract infection, endometriosis, bowel obstruction, hernia, colitis, renal colic, gastroenteritis, volvulus etc.   Complete initial physical exam performed, notably the patient  was NAD, resting comfortably, ambulatory at typical level.    Reviewed and confirmed nursing documentation for past medical history, family history, social history.  Vital signs reviewed.    Clinical Course as of 01/11/23 1541  Thu Jan 11, 2023  0918 Ambulatory to restroom with cane, baseline ambulation [SG]  1101 D/w radiology tech, will include the right hip in the CTAP and CT lumbar, will cancel dedicated ct hip. [SG]  1539 Creatinine(!): 1.25 Similar to baseline (1.13-1.2) [SG]  1540 Hemoglobin(!): 10.3 Similar to baseline 8-10 typical [SG]    Clinical Course User Index [SG] Wynona Dove A, DO    Symptoms have greatly improved.  She is ambulatory her typical level.  Labs reviewed and are stable, similar to her baseline. Chronic changes noted on imaging.  No acute fracture or intra-abdominal abnormality.  She does have punctate nephrolithiasis nonobstructing on the right which may be factor in her discomfort.  Sig arthritic changes noted on imaging. Favor likely musculoskeletal etiology.  Discussed supportive care at home, follow-up with PCP and orthopedics. Recommend NSAIDS / apap   The patient improved significantly and was discharged in stable condition. Detailed discussions were  had with the patient regarding current findings, and need for close f/u with PCP or on call doctor. The patient has been instructed to return immediately if the symptoms worsen in any way for re-evaluation. Patient verbalized understanding and is in agreement with current care plan. All questions answered prior to discharge.      Additional history obtained: -Additional history obtained from family -External records from outside source obtained and reviewed including: Chart review including previous notes, labs, imaging, consultation notes including primary care documentation or prior labs imaging, medications   Lab Tests: -I ordered, reviewed, and interpreted labs.   The pertinent results include:   Labs Reviewed  URINALYSIS, ROUTINE W REFLEX MICROSCOPIC - Abnormal; Notable for the following components:      Result Value   Color, Urine COLORLESS (*)    All other components within normal limits  CBC WITH DIFFERENTIAL/PLATELET - Abnormal; Notable for the following components:   RBC 3.67 (*)    Hemoglobin 10.3 (*)    HCT 32.2 (*)    All other components within normal limits  COMPREHENSIVE METABOLIC PANEL - Abnormal; Notable for the following components:   Glucose, Bld 106 (*)    BUN 29 (*)    Creatinine, Ser 1.25 (*)    GFR, Estimated 46 (*)    All other components within normal limits    Notable for as above stable  EKG   EKG Interpretation  Date/Time:  Ventricular Rate:    PR Interval:    QRS Duration:   QT Interval:    QTC Calculation:   R Axis:     Text Interpretation:           Imaging Studies ordered: I ordered imaging studies including CT abdomen, CT lumbar, x-ray I independently visualized the following imaging with scope of interpretation limited to determining acute life threatening conditions related to emergency care; findings noted above, significant for punctate nonobstructing stone, chronic changes I independently visualized and interpreted  imaging. I agree with the radiologist interpretation   Medicines ordered and prescription drug management: Meds ordered this encounter  Medications   iohexol (OMNIPAQUE) 300 MG/ML solution 100 mL    -I have reviewed the patients home medicines and have made adjustments as needed   Consultations Obtained: na   Cardiac Monitoring: The patient was maintained on a cardiac monitor.  I personally viewed and interpreted the cardiac monitored which showed an underlying rhythm of: SR  Social Determinants of Health:  Diagnosis or treatment significantly limited by social determinants of health: obesity   Reevaluation: After the interventions noted above, I reevaluated the patient and found that they have improved  Co morbidities that complicate the patient evaluation  Past Medical History:  Diagnosis Date   Anemia    Arthritis    Cancer of left kidney ([place])    Left renal mass   GERD (gastroesophageal reflux disease)    History of kidney stones    x3 -remains with one on right.    Hx of seasonal allergies    rare flare ups   Hypertension    Left renal mass    Obesity    Pneumonia    hx of 20 years ago   Right ureteral stone    Type 2 diabetes mellitus ([place])    Type 2   Wears glasses    Wears partial dentures    upper      Dispostion: Disposition decision including need for hospitalization was considered, and patient discharged from emergency department.    Final Clinical Impression(s) / ED Diagnoses Final diagnoses:  Chronic right-sided low back pain without sciatica  Flank pain  Kidney stone     This chart was dictated using voice recognition software.  Despite best efforts to proofread,  errors can occur which can change the documentation meaning.    Jeanell Sparrow, DO 01/11/23 1541

## 2023-01-16 ENCOUNTER — Ambulatory Visit (INDEPENDENT_AMBULATORY_CARE_PROVIDER_SITE_OTHER): Payer: Medicare (Managed Care) | Admitting: Orthopaedic Surgery

## 2023-01-16 ENCOUNTER — Encounter: Payer: Self-pay | Admitting: Orthopaedic Surgery

## 2023-01-16 ENCOUNTER — Other Ambulatory Visit: Payer: Self-pay

## 2023-01-16 DIAGNOSIS — M25551 Pain in right hip: Secondary | ICD-10-CM

## 2023-01-16 DIAGNOSIS — Z96651 Presence of right artificial knee joint: Secondary | ICD-10-CM

## 2023-01-16 NOTE — Progress Notes (Signed)
The patient is well-known to me.  We replaced her right knee in June of last year.  She had a recent fall about a month ago and was having some knee pain.  She actually has x-rays on the canopy system of recently for right knee but also her pelvis and right hip.  I could also see her hips and CT scan.  She is walking without assistive ice.  Her BMI is 45.8.  Her right knee on exam has good range of motion.  There is no significant swelling in the knee feels ligamentously stable.  Her right hip does have pain in the groin on internal and external rotation.  Her left hip has no pain on rotation.  X-rays are reviewed of her right knee on campus system and shows a well-seated total knee arthroplasty with no complicating features.  The pelvis x-rays do show moderate arthritis in both her hips.  I would like to send her to my partner Dr. Ernestina Patches for a fluoroscopic guided steroid injection in the right hip joint.  I will then see her back in about 4 weeks to see how she is doing overall.  I have also advocated weight loss for her and she understands that as well.

## 2023-01-23 ENCOUNTER — Telehealth: Payer: Self-pay | Admitting: Physical Medicine and Rehabilitation

## 2023-01-23 NOTE — Telephone Encounter (Signed)
Kayanna advised she is calling to schedule an appointment with Dr. Ernestina Patches. The number to contact patient is [phone]

## 2023-01-24 ENCOUNTER — Telehealth: Payer: Self-pay | Admitting: Physical Medicine and Rehabilitation

## 2023-01-24 NOTE — Telephone Encounter (Signed)
Spoke with patient and scheduled injection for 01/25/23.

## 2023-01-24 NOTE — Telephone Encounter (Signed)
Pt called requesting this message be sent high priority to make an appt for injection. Please call pt at [phone] or [phone].

## 2023-01-25 ENCOUNTER — Ambulatory Visit (INDEPENDENT_AMBULATORY_CARE_PROVIDER_SITE_OTHER): Payer: Medicare (Managed Care) | Admitting: Physical Medicine and Rehabilitation

## 2023-01-25 ENCOUNTER — Other Ambulatory Visit: Payer: Self-pay

## 2023-01-25 DIAGNOSIS — M25551 Pain in right hip: Secondary | ICD-10-CM | POA: Diagnosis not present

## 2023-01-25 NOTE — Progress Notes (Signed)
Functional Pain Scale - descriptive words and definitions  Moderate (4)   Constantly aware of pain, can complete ADLs with modification/sleep marginally affected at times/passive distraction is of no use, but active distraction gives some relief. Moderate range order  Average Pain 9 when walking   +Driver, -BT, -Dye Allergies.  Right hip pain

## 2023-01-25 NOTE — Progress Notes (Signed)
   Debbie Velasquez - 72 y.o. female MRN [id number]  Date of birth: 19-Nov-1950  Office Visit Note: Visit Date: 01/25/2023 PCP: Fleet Contras, MD Referred by: Fleet Contras, MD  Subjective: Chief Complaint  Patient presents with   Right Hip - Pain   HPI:  Debbie Velasquez is a 72 y.o. female who comes in today at the request of Dr. Doneen Poisson for planned Right anesthetic hip arthrogram with fluoroscopic guidance.  The patient has failed conservative care including home exercise, medications, time and activity modification.  This injection will be diagnostic and hopefully therapeutic.  Please see requesting physician notes for further details and justification.   ROS Otherwise per HPI.  Assessment & Plan: Visit Diagnoses:    ICD-10-CM   1. Pain in right hip  M25.551 XR C-ARM NO REPORT    Large Joint Inj: R hip joint      Plan: No additional findings.   Meds & Orders: No orders of the defined types were placed in this encounter.   Orders Placed This Encounter  Procedures   Large Joint Inj: R hip joint   XR C-ARM NO REPORT    Follow-up: Return for visit to requesting provider as needed.   Procedures: Large Joint Inj: R hip joint on 01/25/2023 10:12 AM Indications: diagnostic evaluation and pain Details: 22 G 3.5 in needle, fluoroscopy-guided anterior approach  Arthrogram: No  Medications: 4 mL bupivacaine 0.25 %; 40 mg triamcinolone acetonide 40 MG/ML Outcome: tolerated well, no immediate complications  There was excellent flow of contrast producing a partial arthrogram of the hip. The patient did have relief of symptoms during the anesthetic phase of the injection. Procedure, treatment alternatives, risks and benefits explained, specific risks discussed. Consent was given by the patient. Immediately prior to procedure a time out was called to verify the correct patient, procedure, equipment, support staff and site/side marked as required. Patient was  prepped and draped in the usual sterile fashion.          Clinical History: No specialty comments available.     Objective:  VS:  HT:    WT:   BMI:     BP:   HR: bpm  TEMP: ( )  RESP:  Physical Exam   Imaging: No results found.

## 2023-01-27 MED ORDER — TRIAMCINOLONE ACETONIDE 40 MG/ML IJ SUSP
40.0000 mg | INTRAMUSCULAR | Status: AC | PRN
  Administered 2023-01-25: 40 mg via INTRA_ARTICULAR

## 2023-01-27 MED ORDER — BUPIVACAINE HCL 0.25 % IJ SOLN
4.0000 mL | INTRAMUSCULAR | Status: AC | PRN
  Administered 2023-01-25: 4 mL via INTRA_ARTICULAR

## 2023-02-19 ENCOUNTER — Encounter: Payer: Self-pay | Admitting: Orthopaedic Surgery

## 2023-02-19 ENCOUNTER — Ambulatory Visit (INDEPENDENT_AMBULATORY_CARE_PROVIDER_SITE_OTHER): Payer: Medicare (Managed Care) | Admitting: Orthopaedic Surgery

## 2023-02-19 DIAGNOSIS — M25551 Pain in right hip: Secondary | ICD-10-CM

## 2023-02-19 NOTE — Progress Notes (Signed)
The patient is a 72 year old female well-known to [place].  We have replaced both of her knees.  She had a fall back in March and both her hips were hurting her.  Her right was worse than the left.  X-rays and CT scan did show arthritic changes in both hips.  I did send her to Dr. Alvester Morin for a fluoroscopic guided steroid injection in the right hip and she said that is really helped her well.  She is taking Tylenol arthritis as well.  Her BMI is 45.  We talked about weight loss and how that would certainly help her.  She is a diabetic and she did have some bump in her blood sugar from the steroid injection.    On exam I can easily put her hips to internal and external rotation with no blocks to rotation and she does not really exhibit much signs of pain at all.  At this point follow-up can be as needed since she is doing well.  However she knows that if it anytime she is having issues to not hesitate to call and come see [place].

## 2023-03-30 ENCOUNTER — Emergency Department (HOSPITAL_BASED_OUTPATIENT_CLINIC_OR_DEPARTMENT_OTHER)
Admission: EM | Admit: 2023-03-30 | Discharge: 2023-03-30 | Disposition: A | Discharge status: Home / Self Care | Payer: Medicare (Managed Care) | Attending: Emergency Medicine | Admitting: Emergency Medicine

## 2023-03-30 ENCOUNTER — Encounter (HOSPITAL_BASED_OUTPATIENT_CLINIC_OR_DEPARTMENT_OTHER): Payer: Self-pay

## 2023-03-30 ENCOUNTER — Emergency Department (HOSPITAL_BASED_OUTPATIENT_CLINIC_OR_DEPARTMENT_OTHER): Payer: Medicare (Managed Care)

## 2023-03-30 ENCOUNTER — Other Ambulatory Visit: Payer: Self-pay

## 2023-03-30 DIAGNOSIS — Z79899 Other long term (current) drug therapy: Secondary | ICD-10-CM | POA: Diagnosis not present

## 2023-03-30 DIAGNOSIS — Z7984 Long term (current) use of oral hypoglycemic drugs: Secondary | ICD-10-CM | POA: Insufficient documentation

## 2023-03-30 DIAGNOSIS — I10 Essential (primary) hypertension: Secondary | ICD-10-CM | POA: Diagnosis not present

## 2023-03-30 DIAGNOSIS — Z7982 Long term (current) use of aspirin: Secondary | ICD-10-CM | POA: Diagnosis not present

## 2023-03-30 DIAGNOSIS — R42 Dizziness and giddiness: Secondary | ICD-10-CM | POA: Diagnosis not present

## 2023-03-30 LAB — CBC
HCT: 31.4 % — ABNORMAL LOW (ref 36.0–46.0)
Hemoglobin: 9.9 g/dL — ABNORMAL LOW (ref 12.0–15.0)
MCH: 28.4 pg (ref 26.0–34.0)
MCHC: 31.5 g/dL (ref 30.0–36.0)
MCV: 90 fL (ref 80.0–100.0)
Platelets: 317 10*3/uL (ref 150–400)
RBC: 3.49 MIL/uL — ABNORMAL LOW (ref 3.87–5.11)
RDW: 14.4 % (ref 11.5–15.5)
WBC: 9.5 10*3/uL (ref 4.0–10.5)
nRBC: 0 % (ref 0.0–0.2)

## 2023-03-30 LAB — CBG MONITORING, ED: Glucose-Capillary: 129 mg/dL — ABNORMAL HIGH (ref 70–99)

## 2023-03-30 LAB — BASIC METABOLIC PANEL
Anion gap: 8 (ref 5–15)
BUN: 19 mg/dL (ref 8–23)
CO2: 27 mmol/L (ref 22–32)
Calcium: 9.1 mg/dL (ref 8.9–10.3)
Chloride: 104 mmol/L (ref 98–111)
Creatinine, Ser: 1.1 mg/dL — ABNORMAL HIGH (ref 0.44–1.00)
GFR, Estimated: 53 mL/min — ABNORMAL LOW (ref >60)
Glucose, Bld: 138 mg/dL — ABNORMAL HIGH (ref 70–99)
Potassium: 4.2 mmol/L (ref 3.5–5.1)
Sodium: 139 mmol/L (ref 135–145)

## 2023-03-30 MED ORDER — MECLIZINE HCL 25 MG PO TABS
25.0000 mg | ORAL_TABLET | Freq: Once | ORAL | Status: AC
  Administered 2023-03-30: 25 mg via ORAL
  Filled 2023-03-30: qty 1

## 2023-03-30 MED ORDER — MECLIZINE HCL 25 MG PO TABS: 25.0000 mg | ORAL_TABLET | Freq: Three times a day (TID) | ORAL | 0 refills | Status: AC | PRN

## 2023-03-30 NOTE — ED Triage Notes (Signed)
Patient here POV from Home.  Endorses Dizziness that began 2 Hours ago. Mostly Consistent and wavers in intensity. No CP. No SOB. No Tingling. No Numbness. No Weakness.   NAD Noted during Triage. A&Ox4. GCS 15. Ambulatory.

## 2023-03-30 NOTE — Discharge Instructions (Signed)
Begin taking meclizine as prescribed as needed for dizziness. ° °Follow-up with primary doctor if not improving in the next week, and return to the ER if symptoms significantly worsen or change. °

## 2023-03-30 NOTE — ED Provider Notes (Signed)
[place] EMERGENCY DEPARTMENT AT [facility] Provider Note   CSN: [id number] Arrival date & time: 03/30/23  0005     History  Chief Complaint  Patient presents with   Dizziness    Debbie Velasquez is a 72 y.o. female.  Patient is a 72 year old female with past medical history of hypertension, hyperlipidemia.  Patient presenting today with complaints of dizziness.  This started approximately 90 minutes prior to presentation.  She describes the sudden onset of a spinning/off-balance sensation that started when she stood up.  She denies any visual disturbances, weakness, or numbness.  Symptoms improved with remaining still and worsened with movement.  No headache.  No injury or trauma.  The history is provided by the patient.       Home Medications Prior to Admission medications   Medication Sig Start Date End Date Taking? Authorizing Provider  allopurinol (ZYLOPRIM) 300 MG tablet Take 300 mg by mouth daily. 07/20/21   [provider]  aspirin 81 MG chewable tablet Chew 1 tablet (81 mg total) by mouth 2 (two) times daily. 04/15/22   Kathryne Hitch, MD  losartan-hydrochlorothiazide (HYZAAR) 100-25 MG tablet Take 1 tablet by mouth daily. 03/16/20   [provider]  metFORMIN (GLUCOPHAGE) 500 MG tablet Take 500 mg by mouth daily with breakfast.     [provider]  methocarbamol (ROBAXIN) 500 MG tablet Take 1 tablet (500 mg total) by mouth every 6 (six) hours as needed for muscle spasms. 04/15/22   Kathryne Hitch, MD  oxyCODONE (OXY IR/ROXICODONE) 5 MG immediate release tablet Take 1-2 tablets (5-10 mg total) by mouth every 6 (six) hours as needed for moderate pain (pain score 4-6). 05/24/22   Kathryne Hitch, MD  rosuvastatin (CRESTOR) 10 MG tablet Take 10 mg by mouth every evening.    [provider]  Turmeric 400 MG CAPS Take 400 mg by mouth daily.    [provider]  Vitamin D, Ergocalciferol, (DRISDOL)  50000 units CAPS capsule Take 50,000 Units by mouth every Thursday. 08/06/17   [provider]      Allergies    Patient has no known allergies.    Review of Systems   Review of Systems  All other systems reviewed and are negative.   Physical Exam Updated Vital Signs BP (!) 182/76 (BP Location: Right Arm)   Pulse 72   Temp (!) 97.3 F (36.3 C)   Resp 18   Ht 5\' 4"  (1.626 m)   Wt 117.9 kg   SpO2 100%   BMI 44.63 kg/m  Physical Exam Vitals and nursing note reviewed.  Constitutional:      General: She is not in acute distress.    Appearance: She is well-developed. She is not diaphoretic.  HENT:     Head: Normocephalic and atraumatic.  Eyes:     Extraocular Movements: Extraocular movements intact.     Pupils: Pupils are equal, round, and reactive to light.  Cardiovascular:     Rate and Rhythm: Normal rate and regular rhythm.     Heart sounds: No murmur heard.    No friction rub. No gallop.  Pulmonary:     Effort: Pulmonary effort is normal. No respiratory distress.     Breath sounds: Normal breath sounds. No wheezing.  Abdominal:     General: Bowel sounds are normal. There is no distension.     Palpations: Abdomen is soft.     Tenderness: There is no abdominal tenderness.  Musculoskeletal:  General: Normal range of motion.     Cervical back: Normal range of motion and neck supple.  Skin:    General: Skin is warm and dry.  Neurological:     General: No focal deficit present.     Mental Status: She is alert and oriented to person, place, and time.     Cranial Nerves: No cranial nerve deficit.     Sensory: No sensory deficit.     Motor: No weakness.     Coordination: Coordination normal.     ED Results / Procedures / Treatments   Labs (all labs ordered are listed, but only abnormal results are displayed) Labs Reviewed  BASIC METABOLIC PANEL - Abnormal; Notable for the following components:      Result Value   Glucose, Bld 138 (*)     Creatinine, Ser 1.10 (*)    GFR, Estimated 53 (*)    All other components within normal limits  CBC - Abnormal; Notable for the following components:   RBC 3.49 (*)    Hemoglobin 9.9 (*)    HCT 31.4 (*)    All other components within normal limits  CBG MONITORING, ED - Abnormal; Notable for the following components:   Glucose-Capillary 129 (*)    All other components within normal limits    EKG EKG Interpretation  Date/Time:  Friday March 30 2023 00:15:39 EDT Ventricular Rate:  77 PR Interval:  216 QRS Duration: 78 QT Interval:  374 QTC Calculation: 423 R Axis:   24 Text Interpretation: Sinus rhythm with 1st degree A-V block Otherwise normal ECG When compared with ECG of 06-Apr-2022 10:33, Premature atrial complexes are no longer Present Confirmed by Geoffery Lyons ([id number]) on 03/30/2023 1:01:48 AM  Radiology No results found.  Procedures Procedures    Medications Ordered in ED Medications  meclizine (ANTIVERT) tablet 25 mg (has no administration in time range)    ED Course/ Medical Decision Making/ A&P  Patient is a 72 year old female presenting with complaints of dizziness as described in the HPI.  Patient arrives here with stable vital signs and physical examination that is unremarkable.  She is neurologically intact.  Workup initiated including CBC, metabolic panel, both of which are unremarkable.  CT scan of the head obtained showing no evidence for intracranial abnormality.  Patient has been given IV fluids and meclizine and seems to be feeling better.  Her symptoms are most likely related to a peripheral vertigo.  I have considered, but doubt stroke given the fact that her symptoms are exacerbated with movement.  She will be discharged with meclizine and as needed return.  Final Clinical Impression(s) / ED Diagnoses Final diagnoses:  None    Rx / DC Orders ED Discharge Orders     None         Geoffery Lyons, MD 03/30/23 0230

## 2023-06-13 ENCOUNTER — Telehealth: Payer: Self-pay | Admitting: Physical Medicine and Rehabilitation

## 2023-06-13 DIAGNOSIS — M25551 Pain in right hip: Secondary | ICD-10-CM

## 2023-06-13 NOTE — Telephone Encounter (Signed)
Pt called requesting an injection in groin right area. Please call pt at [phone].

## 2023-06-14 NOTE — Telephone Encounter (Signed)
Spoke with patient and she is requesting right hip injection. Last injection lasted until recently. No new falls, accidents or injuries. Injection scheduled for 06/20/23

## 2023-06-14 NOTE — Telephone Encounter (Signed)
LVM to return call to clinic.

## 2023-06-14 NOTE — Addendum Note (Signed)
Addended by: Sharlet Salina on: 06/14/2023 04:58 PM   Modules accepted: Orders

## 2023-06-20 ENCOUNTER — Ambulatory Visit: Payer: Medicare (Managed Care) | Admitting: Physical Medicine and Rehabilitation

## 2023-06-20 ENCOUNTER — Other Ambulatory Visit: Payer: Self-pay

## 2023-06-20 DIAGNOSIS — M25551 Pain in right hip: Secondary | ICD-10-CM | POA: Diagnosis not present

## 2023-06-20 NOTE — Progress Notes (Signed)
Functional Pain Scale - descriptive words and definitions  Uncomfortable (3)  Pain is present but can complete all ADL's/sleep is slightly affected and passive distraction only gives marginal relief. Mild range order  Average Pain  varies   +Driver, -BT, -Dye Allergies.  Right hip pain. Pain mostly the end of the day

## 2023-06-22 IMAGING — DX DG KNEE 1-2V PORT*R*
2 series · 2 of 2 positions shown · non-contrast
Comparison: None Available.

CLINICAL DATA: Status post right knee arthroplasty.

EXAM:
PORTABLE RIGHT KNEE - 1-2 VIEW

[knee ap]
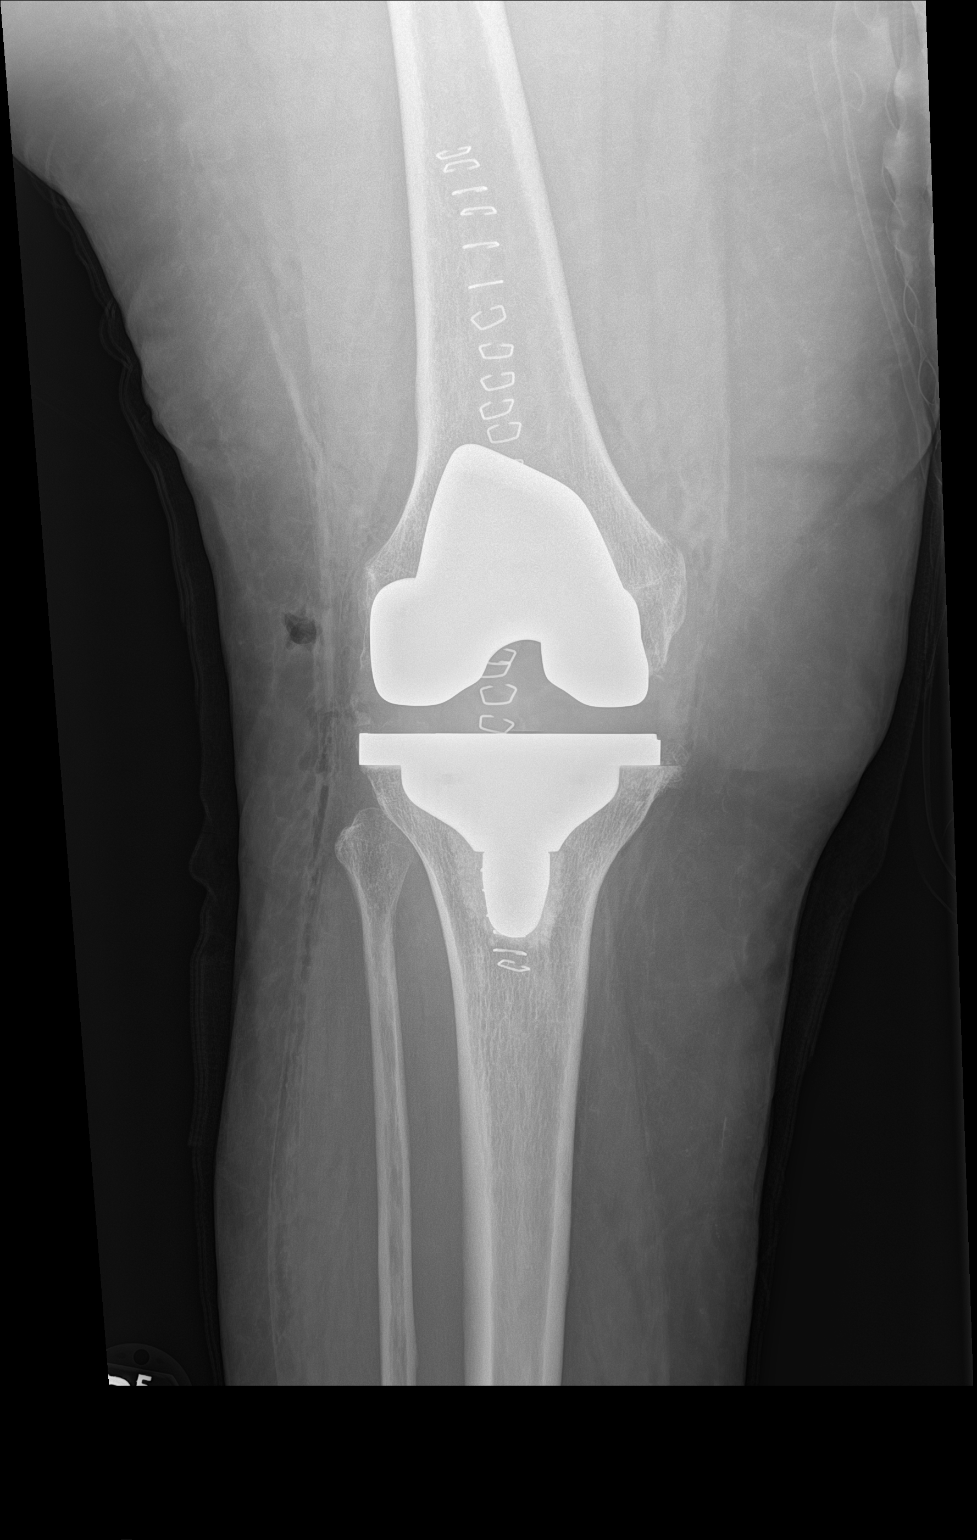

[knee lat]
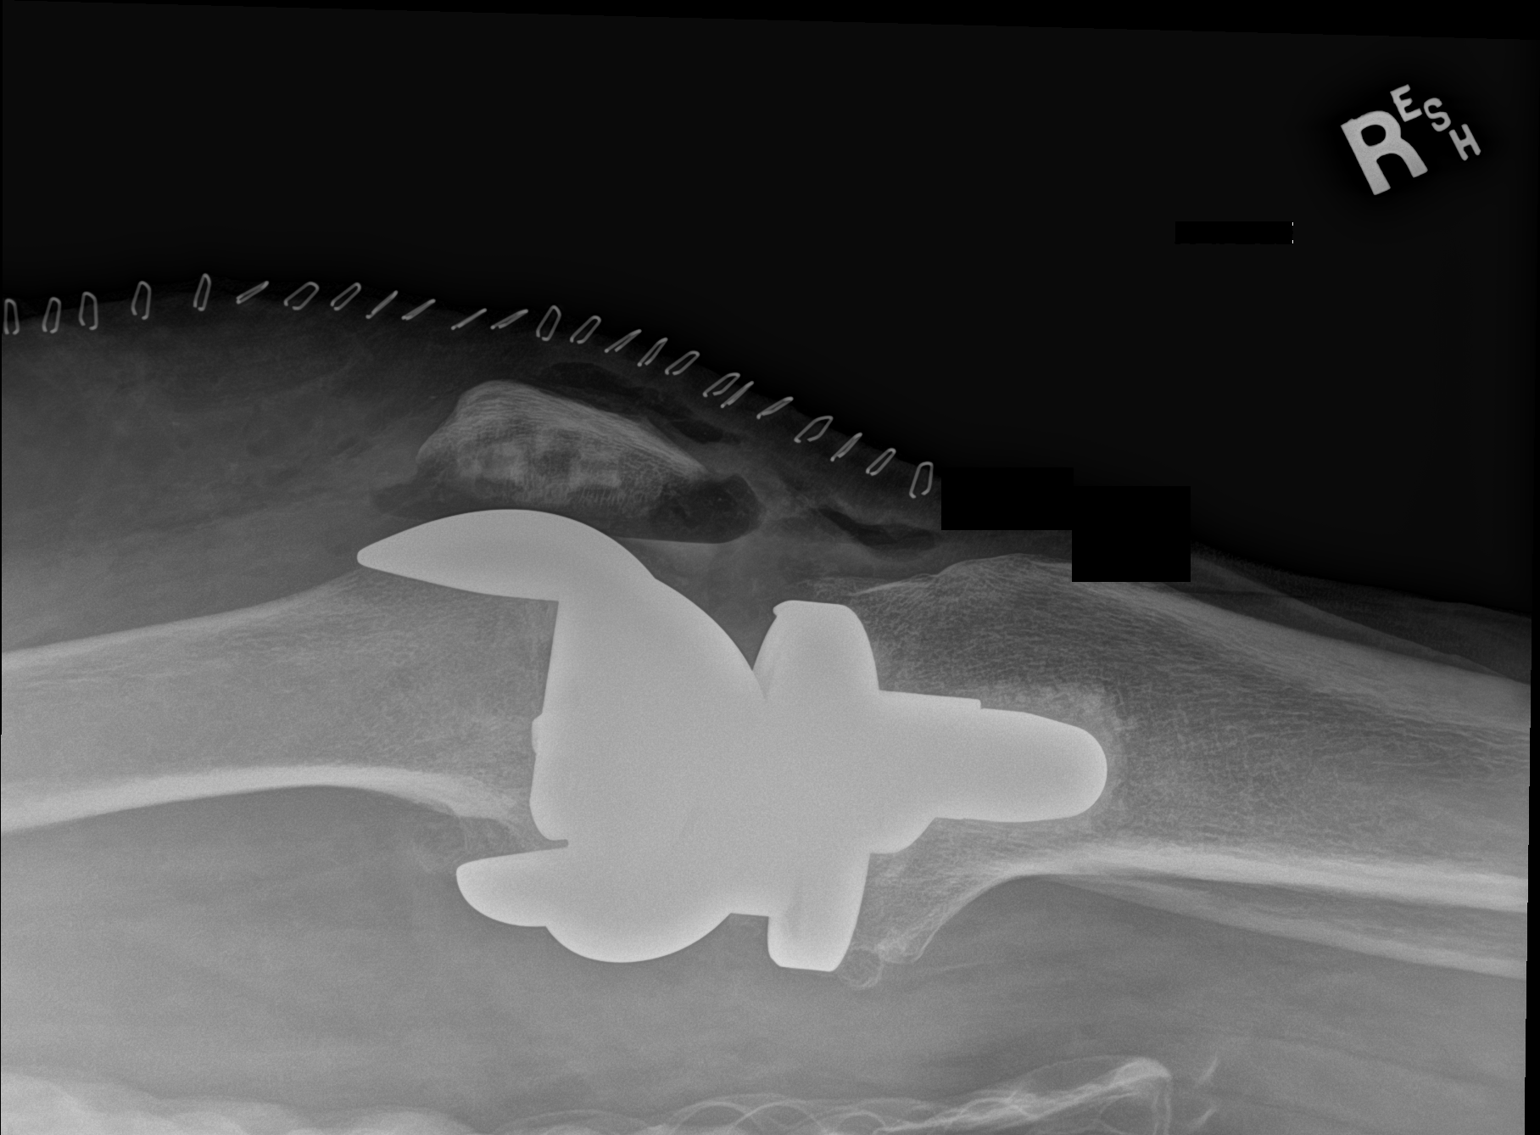

[2 of 2 positions shown; findings below may reference images not displayed]

FINDINGS: Status post right knee arthroplasty with surgical sutures about the
anterior aspect of the knee. Subcutaneous emphysema as expected. No
perihardware fracture.
IMPRESSION: Status post right knee arthroplasty with postsurgical changes as
expected.

## 2023-06-23 MED ORDER — TRIAMCINOLONE ACETONIDE 40 MG/ML IJ SUSP
40.0000 mg | INTRAMUSCULAR | Status: AC | PRN
  Administered 2023-06-20: 40 mg via INTRA_ARTICULAR

## 2023-06-23 MED ORDER — BUPIVACAINE HCL 0.25 % IJ SOLN
4.0000 mL | INTRAMUSCULAR | Status: AC | PRN
  Administered 2023-06-20: 4 mL via INTRA_ARTICULAR

## 2023-06-23 NOTE — Progress Notes (Signed)
   Debbie Velasquez - 72 y.o. female MRN [id number]  Date of birth: 07-27-1951  Office Visit Note: Visit Date: 06/20/2023 PCP: Fleet Contras, MD Referred by: Fleet Contras, MD  Subjective: Chief Complaint  Patient presents with   Right Hip - Pain   HPI:  Debbie Velasquez is a 72 y.o. female who comes in today for planned repeat Right anesthetic hip arthrogram with fluoroscopic guidance.  The patient has failed conservative care including home exercise, medications, time and activity modification. Prior injection gave more than 50% relief for several months. This injection will be diagnostic and hopefully therapeutic.  Please see requesting physician notes for further details and justification.  Referring: Dr. Doneen Poisson   ROS Otherwise per HPI.  Assessment & Plan: Visit Diagnoses:    ICD-10-CM   1. Pain in right hip  M25.551 XR C-ARM NO REPORT      Plan: No additional findings.   Meds & Orders: No orders of the defined types were placed in this encounter.   Orders Placed This Encounter  Procedures   Large Joint Inj   XR C-ARM NO REPORT    Follow-up: Return for visit to requesting provider as needed.   Procedures: Large Joint Inj on 06/20/2023 2:00 PM Indications: diagnostic evaluation and pain Details: 22 G 3.5 in needle, fluoroscopy-guided anterior approach  Arthrogram: No  Medications: 4 mL bupivacaine 0.25 %; 40 mg triamcinolone acetonide 40 MG/ML Outcome: tolerated well, no immediate complications  There was excellent flow of contrast producing a partial arthrogram of the hip. The patient did have relief of symptoms during the anesthetic phase of the injection. Procedure, treatment alternatives, risks and benefits explained, specific risks discussed. Consent was given by the patient. Immediately prior to procedure a time out was called to verify the correct patient, procedure, equipment, support staff and site/side marked as required. Patient was  prepped and draped in the usual sterile fashion.          Clinical History: No specialty comments available.     Objective:  VS:  HT:    WT:   BMI:     BP:   HR: bpm  TEMP: ( )  RESP:  Physical Exam   Imaging: No results found.

## 2023-09-02 ENCOUNTER — Encounter (HOSPITAL_BASED_OUTPATIENT_CLINIC_OR_DEPARTMENT_OTHER): Payer: Self-pay | Admitting: Emergency Medicine

## 2023-09-02 ENCOUNTER — Emergency Department (HOSPITAL_BASED_OUTPATIENT_CLINIC_OR_DEPARTMENT_OTHER)
Admission: EM | Admit: 2023-09-02 | Discharge: 2023-09-02 | Disposition: A | Discharge status: Home / Self Care | Payer: Medicare (Managed Care) | Attending: Emergency Medicine | Admitting: Emergency Medicine

## 2023-09-02 DIAGNOSIS — Z7982 Long term (current) use of aspirin: Secondary | ICD-10-CM | POA: Diagnosis not present

## 2023-09-02 DIAGNOSIS — W57XXXA Bitten or stung by nonvenomous insect and other nonvenomous arthropods, initial encounter: Secondary | ICD-10-CM | POA: Diagnosis not present

## 2023-09-02 DIAGNOSIS — L03115 Cellulitis of right lower limb: Secondary | ICD-10-CM | POA: Insufficient documentation

## 2023-09-02 MED ORDER — CEPHALEXIN 500 MG PO CAPS: 500.0000 mg | ORAL_CAPSULE | Freq: Three times a day (TID) | ORAL | 0 refills | Status: DC

## 2023-09-02 NOTE — ED Triage Notes (Signed)
Pt arrived from home with c/o discomfort, swelling, itching and redness to R top of foot since Friday evening. Used ice pack this am. States she may have been bitten by something.

## 2023-09-02 NOTE — ED Provider Notes (Signed)
[place] EMERGENCY DEPARTMENT AT [facility] Provider Note   CSN: [id number] Arrival date & time: 09/02/23  1018     History  Chief Complaint  Patient presents with   Insect Bite    Debbie Velasquez is a 72 y.o. female.  Patient is a 71 year old female who presents with a sore red spot on her right ankle.  She states she noticed it 2 days ago.  Initially was a little itchy but now it is more painful.  She denies any definite bite wound.  She denies any swelling or pain to her lower leg or knee area.  No fevers.  No injuries noted to the area.  No falls or twisting of the ankle.       Home Medications Prior to Admission medications   Medication Sig Start Date End Date Taking? Authorizing Provider  cephALEXin (KEFLEX) 500 MG capsule Take 1 capsule (500 mg total) by mouth 3 (three) times daily. 09/02/23  Yes Rolan Bucco, MD  allopurinol (ZYLOPRIM) 300 MG tablet Take 300 mg by mouth daily. 07/20/21   [provider]  aspirin 81 MG chewable tablet Chew 1 tablet (81 mg total) by mouth 2 (two) times daily. 04/15/22   Kathryne Hitch, MD  losartan-hydrochlorothiazide (HYZAAR) 100-25 MG tablet Take 1 tablet by mouth daily. 03/16/20   [provider]  meclizine (ANTIVERT) 25 MG tablet Take 1 tablet (25 mg total) by mouth 3 (three) times daily as needed for dizziness. 03/30/23   Geoffery Lyons, MD  metFORMIN (GLUCOPHAGE) 500 MG tablet Take 500 mg by mouth daily with breakfast.     [provider]  methocarbamol (ROBAXIN) 500 MG tablet Take 1 tablet (500 mg total) by mouth every 6 (six) hours as needed for muscle spasms. 04/15/22   Kathryne Hitch, MD  oxyCODONE (OXY IR/ROXICODONE) 5 MG immediate release tablet Take 1-2 tablets (5-10 mg total) by mouth every 6 (six) hours as needed for moderate pain (pain score 4-6). 05/24/22   Kathryne Hitch, MD  rosuvastatin (CRESTOR) 10 MG tablet Take 10 mg by mouth every evening.    [provider]  Turmeric 400 MG CAPS Take 400 mg by mouth daily.    [provider]  Vitamin D, Ergocalciferol, (DRISDOL) 50000 units CAPS capsule Take 50,000 Units by mouth every Thursday. 08/06/17   [provider]      Allergies    Patient has no known allergies.    Review of Systems   Review of Systems  Constitutional:  Negative for fever.  Gastrointestinal:  Negative for nausea and vomiting.  Musculoskeletal:  Positive for arthralgias. Negative for back pain, joint swelling and neck pain.  Skin:  Positive for color change. Negative for wound.  Neurological:  Negative for weakness, numbness and headaches.    Physical Exam Updated Vital Signs BP (!) 157/67 (BP Location: Right Arm)   Pulse 73   Temp 97.7 F (36.5 C)   Resp 15   Ht 5\' 4"  (1.626 m)   Wt 117.9 kg   SpO2 99%   BMI 44.63 kg/m  Physical Exam Constitutional:      Appearance: She is well-developed.  HENT:     Head: Normocephalic and atraumatic.  Cardiovascular:     Rate and Rhythm: Normal rate.  Pulmonary:     Effort: Pulmonary effort is normal.  Musculoskeletal:        General: Tenderness present.     Cervical back: Normal range of motion and neck supple.  Comments: Patient has an area of redness and swelling to the medial aspect overlying her right ankle.  There is no apparent joint involvement.  No spreading of the lower leg.  No generalized swelling to the lower leg.  It is mildly tender on palpation.  No underlying induration or fluctuance.  She is neuro vastly intact to the foot.  No bony tenderness noted.  Skin:    General: Skin is warm and dry.  Neurological:     Mental Status: She is alert and oriented to person, place, and time.     ED Results / Procedures / Treatments   Labs (all labs ordered are listed, but only abnormal results are displayed) Labs Reviewed - No data to display  EKG None  Radiology No results found.  Procedures Procedures    Medications  Ordered in ED Medications - No data to display  ED Course/ Medical Decision Making/ A&P                                 Medical Decision Making Risk Prescription drug management.   Patient has a small localized erythematous area to the medial aspect of her right ankle.  Suspect a small area of cellulitis.  I do not see any bite wounds.  There is no underlying bony tenderness or reports of injury that would warrant imaging.  She does not have other swelling or symptoms that we were concerning for DVT.  Will start her on Keflex.  Encouraged her to follow-up with her PCP this week for recheck.  Return precautions were given.  Final Clinical Impression(s) / ED Diagnoses Final diagnoses:  Cellulitis of right lower extremity    Rx / DC Orders ED Discharge Orders          Ordered    cephALEXin (KEFLEX) 500 MG capsule  3 times daily        09/02/23 1220              Rolan Bucco, MD 09/02/23 1223

## 2023-09-02 NOTE — ED Notes (Signed)
Discharge paperwork given and verbally understood. 

## 2023-09-17 ENCOUNTER — Ambulatory Visit (INDEPENDENT_AMBULATORY_CARE_PROVIDER_SITE_OTHER): Payer: Medicare (Managed Care) | Admitting: Physician Assistant

## 2023-09-17 ENCOUNTER — Encounter: Payer: Self-pay | Admitting: Physician Assistant

## 2023-09-17 ENCOUNTER — Other Ambulatory Visit (INDEPENDENT_AMBULATORY_CARE_PROVIDER_SITE_OTHER): Payer: Self-pay

## 2023-09-17 DIAGNOSIS — M25571 Pain in right ankle and joints of right foot: Secondary | ICD-10-CM

## 2023-09-17 NOTE — Progress Notes (Signed)
Office Visit Note   Patient: Debbie Velasquez           Date of Birth: August 28, 1951           MRN: [id number] Visit Date: 09/17/2023              Requested by: Fleet Contras, MD [address] [place],  [place] [address] PCP: Fleet Contras, MD   Assessment & Plan: Visit Diagnoses:  1. Pain in right ankle and joints of right foot     Plan: Will have her obtain orthotics for both feet he should be medial arch support orthotics.  Also use compression hose during the day to help with her venous insufficiency.  Follow-up as needed.  Questions encouraged and answered  Follow-Up Instructions: Return if symptoms worsen or fail to improve.   Orders:  Orders Placed This Encounter  Procedures   XR Ankle Complete Right   No orders of the defined types were placed in this encounter.     Procedures: No procedures performed   Clinical Data: No additional findings.   Subjective: Chief Complaint  Patient presents with   Right Ankle - Pain    HPI Debbie Velasquez comes in today with right ankle pain.  States she had pain medial aspect of the ankle which she went to the ER for was diagnosed with possible cellulitis.  She finished the Keflex.  This does help tremendously.  She states she has pain worse with walking.  Notes that the swelling has diminished.  She has been taking extra strength Tylenol for the pain.  Pain is medial aspect the ankle.  No known injury  Review of Systems Negative for fevers chills.  See HPI otherwise negative  Objective: Vital Signs: There were no vitals taken for this visit.  Physical Exam General: Well-developed well-nourished female no acute distress mood and affect appropriate Psych: Alert and oriented x 3 Vascular: Dorsal pedal pulses are 2+ and equal symmetric calves are supple and nontender.  Slight pitting edema bilateral lower extremities. Bilateral ankles no rashes skin lesions ulcerations.  There is slight discoloration along the  medial aspect of the right ankle but no abnormal warmth no significant erythema.  Ortho Exam Bilateral feet full dorsiflexion plantarflexion without pain.  Inversion eversion 5 out of 5 strength bilaterally.  Nontender over the left posterior tibial tendon peroneal tendons.  Slight tenderness over the right posterior tibial tendon but no tenderness over the peroneal tendons.  Unable to perform single heel raise bilaterally secondary to weakness.  Too many toes sign bilaterally.  Pes planus feet bilaterally. Specialty Comments:  No specialty comments available.  Imaging: XR Ankle Complete Right  Result Date: 09/17/2023 Right ankle 3 views: No acute fractures.  Talus well located within the ankle mortise no diastases.  No significant arthritic changes.  Os trigonum present.  Mild midfoot dorsal spurring noted.  No subluxations dislocations midfoot.    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    Hx of seasonal allergies    rare flare ups   Hypertension    Left renal mass    Obesity    Pneumonia    hx of 20 years ago   Right ureteral stone    Type 2 diabetes mellitus (HCC)    Type 2   Wears glasses    Wears partial  dentures    upper    Family History  Problem Relation Age of Onset   Esophageal cancer Maternal Aunt    Colon cancer Neg Hx    Stomach cancer Neg Hx    Rectal cancer Neg Hx     Past Surgical History:  Procedure Laterality Date   CESAREAN SECTION  yrs ago   CYSTOSCOPY WITH RETROGRADE PYELOGRAM, URETEROSCOPY AND STENT PLACEMENT Left 05/24/2018   Procedure: CYSTOSCOPY WITH RETROGRADE PYELOGRAM, URETEROSCOPY AND STENT PLACEMENT;  Surgeon: Sebastian Ache, MD;  Location: [facility];  Service: Urology;  Laterality: Left;   CYSTOSCOPY WITH RETROGRADE PYELOGRAM, URETEROSCOPY AND STENT PLACEMENT Left 02/27/2020   Procedure: CYSTOSCOPY WITH RETROGRADE PYELOGRAM, URETEROSCOPY AND STENT PLACEMENT;  Surgeon: Sebastian Ache, MD;  Location: [facility];  Service: Urology;  Laterality: Left;  1 HR   CYSTOSCOPY WITH RETROGRADE PYELOGRAM, URETEROSCOPY AND STENT PLACEMENT Bilateral 10/20/2020   Procedure: CYSTOSCOPY WITH RETROGRADE PYELOGRAM, URETEROSCOPY AND STENT PLACEMENT;  Surgeon: Sebastian Ache, MD;  Location: [facility];  Service: Urology;  Laterality: Bilateral;  75 MINS   HOLMIUM LASER APPLICATION Left 05/24/2018   Procedure: HOLMIUM LASER APPLICATION;  Surgeon: Sebastian Ache, MD;  Location: [facility];  Service: Urology;  Laterality: Left;   HOLMIUM LASER APPLICATION Left 02/27/2020   Procedure: HOLMIUM LASER APPLICATION;  Surgeon: Sebastian Ache, MD;  Location: [facility];  Service: Urology;  Laterality: Left;   HOLMIUM LASER APPLICATION Bilateral 10/20/2020   Procedure: HOLMIUM LASER APPLICATION;  Surgeon: Sebastian Ache, MD;  Location: [facility];  Service: Urology;  Laterality: Bilateral;   ROBOTIC ASSITED PARTIAL NEPHRECTOMY Left 12/22/2015   Procedure: XI ROBOTIC ASSITED PARTIAL NEPHRECTOMY;  Surgeon: Sebastian Ache, MD;  Location: [facility];  Service: Urology;  Laterality: Left;   TOTAL ABDOMINAL HYSTERECTOMY W/ BILATERAL SALPINGOOPHORECTOMY  1980's   TOTAL KNEE ARTHROPLASTY Left 01/12/2017   Procedure: LEFT TOTAL KNEE  ARTHROPLASTY and Right knee steroid injection;  Surgeon: Kathryne Hitch, MD;  Location: [facility];  Service: Orthopedics;  Laterality: Left;   TOTAL KNEE ARTHROPLASTY Right 04/14/2022   Procedure: RIGHT TOTAL KNEE ARTHROPLASTY;  Surgeon: Kathryne Hitch, MD;  Location: [facility];  Service: Orthopedics;  Laterality: Right;   Social History   Occupational History   Occupation: retired  Tobacco Use   Smoking status: Never   Smokeless tobacco: Never  Vaping Use   Vaping status: Never Used  Substance and Sexual Activity   Alcohol use: No    Alcohol/week: 0.0 standard drinks of alcohol   Drug use: No   Sexual activity: Not Currently    Birth control/protection: Surgical

## 2023-12-03 ENCOUNTER — Other Ambulatory Visit: Payer: Self-pay | Admitting: Internal Medicine

## 2023-12-03 DIAGNOSIS — Z1231 Encounter for screening mammogram for malignant neoplasm of breast: Secondary | ICD-10-CM

## 2024-01-03 ENCOUNTER — Ambulatory Visit: Payer: Medicare Other

## 2024-01-03 ENCOUNTER — Ambulatory Visit
Admission: RE | Admit: 2024-01-03 | Discharge: 2024-01-03 | Disposition: A | Discharge status: Home / Self Care | Source: Ambulatory Visit | Attending: Internal Medicine | Admitting: Internal Medicine

## 2024-01-03 DIAGNOSIS — Z1231 Encounter for screening mammogram for malignant neoplasm of breast: Secondary | ICD-10-CM

## 2024-02-02 ENCOUNTER — Emergency Department (HOSPITAL_BASED_OUTPATIENT_CLINIC_OR_DEPARTMENT_OTHER)
Admission: EM | Admit: 2024-02-02 | Discharge: 2024-02-03 | Discharge status: Against Medical Advice | Attending: Emergency Medicine | Admitting: Emergency Medicine

## 2024-02-02 ENCOUNTER — Other Ambulatory Visit: Payer: Self-pay

## 2024-02-02 DIAGNOSIS — R002 Palpitations: Secondary | ICD-10-CM | POA: Diagnosis present

## 2024-02-02 DIAGNOSIS — Z5321 Procedure and treatment not carried out due to patient leaving prior to being seen by health care provider: Secondary | ICD-10-CM | POA: Diagnosis not present

## 2024-02-02 LAB — BASIC METABOLIC PANEL WITH GFR
Anion gap: 8 (ref 5–15)
BUN: 27 mg/dL — ABNORMAL HIGH (ref 8–23)
CO2: 27 mmol/L (ref 22–32)
Calcium: 9.3 mg/dL (ref 8.9–10.3)
Chloride: 102 mmol/L (ref 98–111)
Creatinine, Ser: 1.33 mg/dL — ABNORMAL HIGH (ref 0.44–1.00)
GFR, Estimated: 42 mL/min — ABNORMAL LOW (ref >60)
Glucose, Bld: 90 mg/dL (ref 70–99)
Potassium: 4.1 mmol/L (ref 3.5–5.1)
Sodium: 137 mmol/L (ref 135–145)

## 2024-02-02 LAB — CBC
HCT: 31.8 % — ABNORMAL LOW (ref 36.0–46.0)
Hemoglobin: 10 g/dL — ABNORMAL LOW (ref 12.0–15.0)
MCH: 27.8 pg (ref 26.0–34.0)
MCHC: 31.4 g/dL (ref 30.0–36.0)
MCV: 88.3 fL (ref 80.0–100.0)
Platelets: 329 10*3/uL (ref 150–400)
RBC: 3.6 MIL/uL — ABNORMAL LOW (ref 3.87–5.11)
RDW: 14.4 % (ref 11.5–15.5)
WBC: 10.5 10*3/uL (ref 4.0–10.5)
nRBC: 0 % (ref 0.0–0.2)

## 2024-02-02 LAB — TROPONIN I (HIGH SENSITIVITY): Troponin I (High Sensitivity): 6 ng/L (ref <18)

## 2024-02-02 NOTE — ED Triage Notes (Signed)
 Pt reports that she increased her dose of Ozempic last week and has had intermittent daily episodes of feeling like her heart is racing and she feels anxious. Denies CP. Denies SOB.

## 2024-02-07 ENCOUNTER — Encounter (HOSPITAL_BASED_OUTPATIENT_CLINIC_OR_DEPARTMENT_OTHER): Payer: Self-pay

## 2024-02-07 ENCOUNTER — Emergency Department (HOSPITAL_BASED_OUTPATIENT_CLINIC_OR_DEPARTMENT_OTHER)
Admission: EM | Admit: 2024-02-07 | Discharge: 2024-02-07 | Disposition: A | Discharge status: Home / Self Care | Attending: Emergency Medicine | Admitting: Emergency Medicine

## 2024-02-07 ENCOUNTER — Other Ambulatory Visit: Payer: Self-pay

## 2024-02-07 DIAGNOSIS — Z7984 Long term (current) use of oral hypoglycemic drugs: Secondary | ICD-10-CM | POA: Insufficient documentation

## 2024-02-07 DIAGNOSIS — R002 Palpitations: Secondary | ICD-10-CM | POA: Diagnosis present

## 2024-02-07 DIAGNOSIS — Z96653 Presence of artificial knee joint, bilateral: Secondary | ICD-10-CM | POA: Diagnosis not present

## 2024-02-07 DIAGNOSIS — Z85528 Personal history of other malignant neoplasm of kidney: Secondary | ICD-10-CM | POA: Diagnosis not present

## 2024-02-07 DIAGNOSIS — I10 Essential (primary) hypertension: Secondary | ICD-10-CM | POA: Insufficient documentation

## 2024-02-07 DIAGNOSIS — Z79899 Other long term (current) drug therapy: Secondary | ICD-10-CM | POA: Insufficient documentation

## 2024-02-07 DIAGNOSIS — Z7982 Long term (current) use of aspirin: Secondary | ICD-10-CM | POA: Diagnosis not present

## 2024-02-07 DIAGNOSIS — E119 Type 2 diabetes mellitus without complications: Secondary | ICD-10-CM | POA: Diagnosis not present

## 2024-02-07 LAB — COMPREHENSIVE METABOLIC PANEL WITH GFR
ALT: 8 U/L (ref 0–44)
AST: 12 U/L — ABNORMAL LOW (ref 15–41)
Albumin: 4.1 g/dL (ref 3.5–5.0)
Alkaline Phosphatase: 74 U/L (ref 38–126)
Anion gap: 9 (ref 5–15)
BUN: 25 mg/dL — ABNORMAL HIGH (ref 8–23)
CO2: 27 mmol/L (ref 22–32)
Calcium: 9.6 mg/dL (ref 8.9–10.3)
Chloride: 104 mmol/L (ref 98–111)
Creatinine, Ser: 1.34 mg/dL — ABNORMAL HIGH (ref 0.44–1.00)
GFR, Estimated: 42 mL/min — ABNORMAL LOW (ref >60)
Glucose, Bld: 85 mg/dL (ref 70–99)
Potassium: 3.8 mmol/L (ref 3.5–5.1)
Sodium: 140 mmol/L (ref 135–145)
Total Bilirubin: 0.3 mg/dL (ref 0.0–1.2)
Total Protein: 7.3 g/dL (ref 6.5–8.1)

## 2024-02-07 LAB — CBC WITH DIFFERENTIAL/PLATELET
Abs Immature Granulocytes: 0.03 10*3/uL (ref 0.00–0.07)
Basophils Absolute: 0 10*3/uL (ref 0.0–0.1)
Basophils Relative: 0 %
Eosinophils Absolute: 0.3 10*3/uL (ref 0.0–0.5)
Eosinophils Relative: 3 %
HCT: 32.2 % — ABNORMAL LOW (ref 36.0–46.0)
Hemoglobin: 9.9 g/dL — ABNORMAL LOW (ref 12.0–15.0)
Immature Granulocytes: 0 %
Lymphocytes Relative: 20 %
Lymphs Abs: 2 10*3/uL (ref 0.7–4.0)
MCH: 27.6 pg (ref 26.0–34.0)
MCHC: 30.7 g/dL (ref 30.0–36.0)
MCV: 89.7 fL (ref 80.0–100.0)
Monocytes Absolute: 0.5 10*3/uL (ref 0.1–1.0)
Monocytes Relative: 6 %
Neutro Abs: 6.9 10*3/uL (ref 1.7–7.7)
Neutrophils Relative %: 71 %
Platelets: 321 10*3/uL (ref 150–400)
RBC: 3.59 MIL/uL — ABNORMAL LOW (ref 3.87–5.11)
RDW: 14.4 % (ref 11.5–15.5)
WBC: 9.8 10*3/uL (ref 4.0–10.5)
nRBC: 0 % (ref 0.0–0.2)

## 2024-02-07 LAB — TSH: TSH: 1.844 u[IU]/mL (ref 0.350–4.500)

## 2024-02-07 NOTE — Discharge Instructions (Signed)
 While you were in the emergency room, your blood work done that was at your baseline.  Your kidney function is where it usually is, and your red blood cells were a little bit on the low side, but this is normal for you.  Your EKG was normal, and your heart rate stayed out of normal rate while you are here in the ED.  I would recommend following up with your primary care doctor to discuss this further and to see whether or not you should wear a cardiac monitor.  Return to the emergency room if develop any lightheadedness or pain in your chest.

## 2024-02-07 NOTE — ED Provider Notes (Signed)
 [place] EMERGENCY DEPARTMENT AT [facility] Provider Note  CSN: [id number] Arrival date & time: 02/07/24 1139  Chief Complaint(s) Irregular Heart Beat  HPI Debbie Velasquez is a 73 y.o. female who is here today because she has felt as though her heart rate has occasionally been increased over the last 1 week.  Patient states that she has been on Ozempic for several months, however recently her doctor had increased the dosing.  Patient says that at times she has felt as though her heart rate is increased, she notices it when she is laying down in bed.  She does not have associated chest pain or shortness of breath with this.  Patient says that she knows that on increased heart rate is a possible side effect of the medication so she came to the emergency department for evaluation.   Past Medical History Past Medical History:  Diagnosis Date   Anemia    Arthritis    Cancer of left kidney (HCC)    Left renal mass   GERD (gastroesophageal reflux disease)    History of kidney stones    x3 -remains with one on right.    Hx of seasonal allergies    rare flare ups   Hypertension    Left renal mass    Obesity    Pneumonia    hx of 20 years ago   Right ureteral stone    Type 2 diabetes mellitus (HCC)    Type 2   Wears glasses    Wears partial dentures    upper   Patient Active Problem List   Diagnosis Date Noted   Status post total right knee replacement 04/14/2022   Chest pain 10/09/2020   Nausea 10/09/2020   Hypertension    Type 2 diabetes mellitus (HCC)    GERD (gastroesophageal reflux disease)    Facet degeneration of lumbar region 12/18/2018   Chronic low back pain without sciatica 06/18/2017   Primary osteoarthritis of right knee 03/26/2017   Presence of left artificial knee joint 02/26/2017   Unilateral primary osteoarthritis, left knee 01/12/2017   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/12/2017   Renal mass 12/22/2015   Home Medication(s) Prior  to Admission medications   Medication Sig Start Date End Date Taking? Authorizing Provider  allopurinol (ZYLOPRIM) 300 MG tablet Take 300 mg by mouth daily. 07/20/21   [provider]  aspirin 81 MG chewable tablet Chew 1 tablet (81 mg total) by mouth 2 (two) times daily. 04/15/22   Kathryne Hitch, MD  cephALEXin (KEFLEX) 500 MG capsule Take 1 capsule (500 mg total) by mouth 3 (three) times daily. 09/02/23   Rolan Bucco, MD  losartan-hydrochlorothiazide (HYZAAR) 100-25 MG tablet Take 1 tablet by mouth daily. 03/16/20   [provider]  meclizine (ANTIVERT) 25 MG tablet Take 1 tablet (25 mg total) by mouth 3 (three) times daily as needed for dizziness. 03/30/23   Geoffery Lyons, MD  metFORMIN (GLUCOPHAGE) 500 MG tablet Take 500 mg by mouth daily with breakfast.     [provider]  methocarbamol (ROBAXIN) 500 MG tablet Take 1 tablet (500 mg total) by mouth every 6 (six) hours as needed for muscle spasms. 04/15/22   Kathryne Hitch, MD  oxyCODONE (OXY IR/ROXICODONE) 5 MG immediate release tablet Take 1-2 tablets (5-10 mg total) by mouth every 6 (six) hours as needed for moderate pain (pain score 4-6). 05/24/22   Kathryne Hitch, MD  OZEMPIC, 0.25 OR 0.5 MG/DOSE, 2 MG/3ML  SOPN Subcutaneous for 42 Days    [provider]  rosuvastatin (CRESTOR) 10 MG tablet Take 10 mg by mouth every evening.    [provider]  Turmeric 400 MG CAPS Take 400 mg by mouth daily.    [provider]  Vitamin D, Ergocalciferol, (DRISDOL) 50000 units CAPS capsule Take 50,000 Units by mouth every Thursday. 08/06/17   [provider]                                                                                                                                    Past Surgical History Past Surgical History:  Procedure Laterality Date   CESAREAN SECTION  yrs ago   CYSTOSCOPY WITH RETROGRADE PYELOGRAM, URETEROSCOPY AND STENT PLACEMENT Left 05/24/2018    Procedure: CYSTOSCOPY WITH RETROGRADE PYELOGRAM, URETEROSCOPY AND STENT PLACEMENT;  Surgeon: Sebastian Ache, MD;  Location: [facility];  Service: Urology;  Laterality: Left;   CYSTOSCOPY WITH RETROGRADE PYELOGRAM, URETEROSCOPY AND STENT PLACEMENT Left 02/27/2020   Procedure: CYSTOSCOPY WITH RETROGRADE PYELOGRAM, URETEROSCOPY AND STENT PLACEMENT;  Surgeon: Sebastian Ache, MD;  Location: [facility];  Service: Urology;  Laterality: Left;  1 HR   CYSTOSCOPY WITH RETROGRADE PYELOGRAM, URETEROSCOPY AND STENT PLACEMENT Bilateral 10/20/2020   Procedure: CYSTOSCOPY WITH RETROGRADE PYELOGRAM, URETEROSCOPY AND STENT PLACEMENT;  Surgeon: Sebastian Ache, MD;  Location: [facility];  Service: Urology;  Laterality: Bilateral;  73 MINS   HOLMIUM LASER APPLICATION Left 05/24/2018   Procedure: HOLMIUM LASER APPLICATION;  Surgeon: Sebastian Ache, MD;  Location: [facility];  Service: Urology;  Laterality: Left;   HOLMIUM LASER APPLICATION Left 02/27/2020   Procedure: HOLMIUM LASER APPLICATION;  Surgeon: Sebastian Ache, MD;  Location: [facility];  Service: Urology;  Laterality: Left;   HOLMIUM LASER APPLICATION Bilateral 10/20/2020   Procedure: HOLMIUM LASER APPLICATION;  Surgeon: Sebastian Ache, MD;  Location: [facility];  Service: Urology;  Laterality: Bilateral;   ROBOTIC ASSITED PARTIAL NEPHRECTOMY Left 12/22/2015   Procedure: XI ROBOTIC ASSITED PARTIAL NEPHRECTOMY;  Surgeon: Sebastian Ache, MD;  Location: [facility];  Service: Urology;  Laterality: Left;   TOTAL ABDOMINAL HYSTERECTOMY W/ BILATERAL SALPINGOOPHORECTOMY  1980's   TOTAL KNEE ARTHROPLASTY Left 01/12/2017   Procedure: LEFT TOTAL KNEE ARTHROPLASTY and Right knee steroid injection;  Surgeon: Kathryne Hitch, MD;  Location: [facility];  Service: Orthopedics;  Laterality: Left;   TOTAL KNEE ARTHROPLASTY Right 04/14/2022   Procedure: RIGHT TOTAL KNEE ARTHROPLASTY;  Surgeon: Kathryne Hitch, MD;  Location: [facility];  Service: Orthopedics;  Laterality: Right;   Family  History Family History  Problem Relation Age of Onset   Esophageal cancer Maternal Aunt    Colon cancer Neg Hx    Stomach cancer Neg Hx    Rectal cancer Neg Hx     Social History Social History   Tobacco Use   Smoking status: Never   Smokeless tobacco: Never  Vaping Use   Vaping status: Never Used  Substance Use Topics   Alcohol use: No    Alcohol/week: 0.0 standard drinks of alcohol   Drug use: No   Allergies Patient has no known allergies.  Review of Systems Review of Systems  Physical Exam Vital Signs  I have reviewed the triage vital signs BP (!) 150/77   Pulse 65   Temp 98 F (36.7 C) (Oral)   Resp 16   Ht 5\' 4"  (1.626 m)   Wt 112.9 kg   SpO2 100%   BMI 42.74 kg/m   Physical Exam Vitals and nursing note reviewed.  Constitutional:      Appearance: She is not toxic-appearing.  Cardiovascular:     Rate and Rhythm: Normal rate.  Pulmonary:     Effort: Pulmonary effort is normal.  Abdominal:     General: Abdomen is flat.     Palpations: Abdomen is soft.  Neurological:     Mental Status: She is alert.     ED Results and Treatments Labs (all labs ordered are listed, but only abnormal results are displayed) Labs Reviewed  COMPREHENSIVE METABOLIC PANEL WITH GFR - Abnormal; Notable for the following components:      Result Value   BUN 25 (*)    Creatinine, Ser 1.34 (*)    AST 12 (*)    GFR, Estimated 42 (*)    All other components within normal limits  CBC WITH DIFFERENTIAL/PLATELET - Abnormal; Notable for the following components:   RBC 3.59 (*)    Hemoglobin 9.9 (*)    HCT 32.2 (*)    All other components within normal limits  TSH                                                                                                                          Radiology No results found.  Pertinent labs & imaging results that were available during my care of the patient were reviewed by me and considered in my medical decision making (see MDM for  details).  Medications Ordered in ED Medications - No data to display                                                                                                                                   Procedures Procedures  (including critical care time)  Medical Decision Making / ED Course   This patient presents to the ED for concern of increased  heart rate, this involves an extensive number of treatment options, and is a complaint that carries with it a high risk of complications and morbidity.  The differential diagnosis includes irregular heart rate, sinus tachycardia, normal rhythm, medication side effect.  MDM: Patient with a heart rate in the 70s.  My independent review of the patient's EKG shows no ST segment depressions or elevations, no T wave inversions, no evidence of acute ischemia.  Will check some basic labs on the patient, will monitor here in the emergency department throughout her workup.  My suspicion is the patient is likely just becoming more noticeable of her heart rate when she lies down, and is appropriately applying information that she knows about these medications and seeking medical care.  Overall patient looks well.  Reassessment 2:20 PM-patient's heart rate has remained in the 60s in the emergency room.  Blood work is returning, hemoglobin is low but at baseline.  Renal function also at baseline.  Discussed with the patient, she feels safe for discharge with PCP follow-up.  Additional history obtained: -Additional history obtained from  -External records from outside source obtained and reviewed including: Chart review including previous notes, labs, imaging, consultation notes   Lab Tests: -I ordered, reviewed, and interpreted labs.   The pertinent results include:   Labs Reviewed  COMPREHENSIVE METABOLIC PANEL WITH GFR - Abnormal; Notable for the following components:      Result Value   BUN 25 (*)    Creatinine, Ser 1.34 (*)    AST 12 (*)    GFR,  Estimated 42 (*)    All other components within normal limits  CBC WITH DIFFERENTIAL/PLATELET - Abnormal; Notable for the following components:   RBC 3.59 (*)    Hemoglobin 9.9 (*)    HCT 32.2 (*)    All other components within normal limits  TSH      EKG my independent review of the patient's EKG shows no ST segment depressions or elevations, no T wave inversions, no evidence of acute ischemia.  EKG Interpretation Date/Time:  Thursday February 07 2024 11:53:52 EDT Ventricular Rate:  80 PR Interval:  223 QRS Duration:  90 QT Interval:  380 QTC Calculation: 439 R Axis:   34  Text Interpretation: Sinus rhythm Prolonged PR interval Abnormal R-wave progression, early transition Confirmed by Anders Simmonds [phone]) on 02/07/2024 12:35:17 PM           Cardiac Monitoring: The patient was maintained on a cardiac monitor.  I personally viewed and interpreted the cardiac monitored which showed an underlying rhythm of: Normal sinus rhythm  Social Determinants of Health:  Factors impacting patients care include: Lack of access to primary care   Reevaluation: After the interventions noted above, I reevaluated the patient and found that they have :improved  Co morbidities that complicate the patient evaluation  Past Medical History:  Diagnosis Date   Anemia    Arthritis    Cancer of left kidney (HCC)    Left renal mass   GERD (gastroesophageal reflux disease)    History of kidney stones    x3 -remains with one on right.    Hx of seasonal allergies    rare flare ups   Hypertension    Left renal mass    Obesity    Pneumonia    hx of 20 years ago   Right ureteral stone    Type 2 diabetes mellitus (HCC)    Type 2   Wears glasses    Wears  partial dentures    upper      Dispostion: Discharge     Final Clinical Impression(s) / ED Diagnoses Final diagnoses:  Palpitations     @PCDICTATION @    Afton Horse T, DO 02/07/24 1425

## 2024-02-07 NOTE — ED Triage Notes (Signed)
 In for eval of feeling of irregular heart rate. Increased dose of Ozempic 1mg  weekly last week. Denies chest pain, SOB, N, V.

## 2024-03-20 ENCOUNTER — Emergency Department (HOSPITAL_BASED_OUTPATIENT_CLINIC_OR_DEPARTMENT_OTHER)
Admission: EM | Admit: 2024-03-20 | Discharge: 2024-03-20 | Disposition: A | Discharge status: Home / Self Care | Attending: Emergency Medicine | Admitting: Emergency Medicine

## 2024-03-20 ENCOUNTER — Other Ambulatory Visit: Payer: Self-pay

## 2024-03-20 ENCOUNTER — Emergency Department (HOSPITAL_BASED_OUTPATIENT_CLINIC_OR_DEPARTMENT_OTHER)

## 2024-03-20 DIAGNOSIS — I10 Essential (primary) hypertension: Secondary | ICD-10-CM | POA: Insufficient documentation

## 2024-03-20 DIAGNOSIS — N390 Urinary tract infection, site not specified: Secondary | ICD-10-CM | POA: Insufficient documentation

## 2024-03-20 DIAGNOSIS — Z79899 Other long term (current) drug therapy: Secondary | ICD-10-CM | POA: Diagnosis not present

## 2024-03-20 DIAGNOSIS — Z794 Long term (current) use of insulin: Secondary | ICD-10-CM | POA: Insufficient documentation

## 2024-03-20 DIAGNOSIS — Z7984 Long term (current) use of oral hypoglycemic drugs: Secondary | ICD-10-CM | POA: Insufficient documentation

## 2024-03-20 DIAGNOSIS — R112 Nausea with vomiting, unspecified: Secondary | ICD-10-CM | POA: Insufficient documentation

## 2024-03-20 DIAGNOSIS — E119 Type 2 diabetes mellitus without complications: Secondary | ICD-10-CM | POA: Diagnosis not present

## 2024-03-20 DIAGNOSIS — R109 Unspecified abdominal pain: Secondary | ICD-10-CM | POA: Diagnosis present

## 2024-03-20 DIAGNOSIS — Z7982 Long term (current) use of aspirin: Secondary | ICD-10-CM | POA: Insufficient documentation

## 2024-03-20 LAB — COMPREHENSIVE METABOLIC PANEL WITH GFR
ALT: 46 U/L — ABNORMAL HIGH (ref 0–44)
AST: 103 U/L — ABNORMAL HIGH (ref 15–41)
Albumin: 4.1 g/dL (ref 3.5–5.0)
Alkaline Phosphatase: 102 U/L (ref 38–126)
Anion gap: 13 (ref 5–15)
BUN: 23 mg/dL (ref 8–23)
CO2: 24 mmol/L (ref 22–32)
Calcium: 9.8 mg/dL (ref 8.9–10.3)
Chloride: 101 mmol/L (ref 98–111)
Creatinine, Ser: 1.12 mg/dL — ABNORMAL HIGH (ref 0.44–1.00)
GFR, Estimated: 51 mL/min — ABNORMAL LOW (ref >60)
Glucose, Bld: 153 mg/dL — ABNORMAL HIGH (ref 70–99)
Potassium: 3.8 mmol/L (ref 3.5–5.1)
Sodium: 138 mmol/L (ref 135–145)
Total Bilirubin: 0.6 mg/dL (ref 0.0–1.2)
Total Protein: 7.8 g/dL (ref 6.5–8.1)

## 2024-03-20 LAB — URINALYSIS, ROUTINE W REFLEX MICROSCOPIC
Bilirubin Urine: NEGATIVE
Glucose, UA: NEGATIVE mg/dL
Hgb urine dipstick: NEGATIVE
Ketones, ur: NEGATIVE mg/dL
Nitrite: NEGATIVE
Specific Gravity, Urine: 1.02 (ref 1.005–1.030)
pH: 5 (ref 5.0–8.0)

## 2024-03-20 LAB — CBC
HCT: 30.7 % — ABNORMAL LOW (ref 36.0–46.0)
Hemoglobin: 9.5 g/dL — ABNORMAL LOW (ref 12.0–15.0)
MCH: 27.8 pg (ref 26.0–34.0)
MCHC: 30.9 g/dL (ref 30.0–36.0)
MCV: 89.8 fL (ref 80.0–100.0)
Platelets: 341 10*3/uL (ref 150–400)
RBC: 3.42 MIL/uL — ABNORMAL LOW (ref 3.87–5.11)
RDW: 14.8 % (ref 11.5–15.5)
WBC: 11.5 10*3/uL — ABNORMAL HIGH (ref 4.0–10.5)
nRBC: 0 % (ref 0.0–0.2)

## 2024-03-20 LAB — TROPONIN T, HIGH SENSITIVITY: Troponin T High Sensitivity: <15 ng/L (ref <19)

## 2024-03-20 LAB — LIPASE, BLOOD: Lipase: 85 U/L — ABNORMAL HIGH (ref 11–51)

## 2024-03-20 MED ORDER — CEPHALEXIN 250 MG PO CAPS
500.0000 mg | ORAL_CAPSULE | Freq: Once | ORAL | Status: AC
  Administered 2024-03-20: 500 mg via ORAL
  Filled 2024-03-20: qty 2

## 2024-03-20 MED ORDER — DICYCLOMINE HCL 10 MG PO CAPS
10.0000 mg | ORAL_CAPSULE | Freq: Once | ORAL | Status: AC
  Administered 2024-03-20: 10 mg via ORAL
  Filled 2024-03-20: qty 1

## 2024-03-20 MED ORDER — ONDANSETRON HCL 4 MG/2ML IJ SOLN
4.0000 mg | Freq: Once | INTRAMUSCULAR | Status: AC
  Administered 2024-03-20: 4 mg via INTRAVENOUS
  Filled 2024-03-20: qty 2

## 2024-03-20 MED ORDER — ONDANSETRON 4 MG PO TBDP: 4.0000 mg | ORAL_TABLET | Freq: Three times a day (TID) | ORAL | 0 refills | Status: AC | PRN

## 2024-03-20 MED ORDER — ALUM & MAG HYDROXIDE-SIMETH 200-200-20 MG/5ML PO SUSP
30.0000 mL | Freq: Once | ORAL | Status: AC
  Administered 2024-03-20: 30 mL via ORAL
  Filled 2024-03-20: qty 30

## 2024-03-20 MED ORDER — CEPHALEXIN 500 MG PO CAPS: 500.0000 mg | ORAL_CAPSULE | Freq: Two times a day (BID) | ORAL | 0 refills | Status: AC

## 2024-03-20 MED ORDER — MORPHINE SULFATE (PF) 2 MG/ML IV SOLN
2.0000 mg | Freq: Once | INTRAVENOUS | Status: AC
  Administered 2024-03-20: 2 mg via INTRAVENOUS
  Filled 2024-03-20: qty 1

## 2024-03-20 MED ORDER — TIZANIDINE HCL 4 MG PO TABS: 4.0000 mg | ORAL_TABLET | Freq: Three times a day (TID) | ORAL | 0 refills | Status: AC | PRN

## 2024-03-20 NOTE — ED Notes (Signed)
 Pt aware of the need for a urine... Unable to currently provide a sample.Marland KitchenMarland Kitchen

## 2024-03-20 NOTE — Discharge Instructions (Addendum)
 You came to the hospital because of the pain in your flank.  Your heart numbers are normal. This may be related to a urinary tract infection. We gave you antibiotics here and sent a prescription for Keflex  to your pharmacy for you to take twice a day for the next 7 days. I have also sent as needed Zofran  for nausea as well as tizanidine to take every 8 hours as needed for muscular related back pain. There are multiple findings on your CT scan that require follow-up with your PCP.  You will need an MRI of your abdomen as well as a potential ultrasound of your liver. If your leg pain is worsening or you have new fevers, chest pain or dyspnea please return to seek medical care.

## 2024-03-20 NOTE — ED Provider Notes (Signed)
 [facility] AT [facility] Provider Note   CSN: [id number] Arrival date & time: 03/20/24  1324     History  Chief Complaint  Patient presents with   Flank Pain    Debbie Velasquez is a 73 y.o. female.  Debbie Velasquez is 73 y.o. with pertinent past medical history history of hypertension, type 2 diabetes, left-sided renal mass s/p partial left nephrectomy, recurrent nephrolithiasis presenting with acute onset left flank pain.  Pain is achy does radiate slightly across her back.  States that this feels like her previous kidney stones.  Does note the above history of previous renal stones.  She has had some nausea and vomiting since the pain began.  4 episodes of nonbloody nonbilious small-volume emesis.  She had a normal bowel movement this morning as well.  She has had no fevers or chills.  She does not have any pain with urination and has not had any increased frequency of urination.        Home Medications Prior to Admission medications   Medication Sig Start Date End Date Taking? Authorizing Provider  cephALEXin  (KEFLEX ) 500 MG capsule Take 1 capsule (500 mg total) by mouth 2 (two) times daily for 7 days. 03/20/24 03/27/24 Yes Ivin Marrow, MD  ondansetron  (ZOFRAN -ODT) 4 MG disintegrating tablet Take 1 tablet (4 mg total) by mouth every 8 (eight) hours as needed for nausea. 03/20/24  Yes Aidan Moten, MD  tiZANidine (ZANAFLEX) 4 MG tablet Take 1 tablet (4 mg total) by mouth every 8 (eight) hours as needed for muscle spasms. 03/20/24  Yes Ivin Marrow, MD  allopurinol  (ZYLOPRIM ) 300 MG tablet Take 300 mg by mouth daily. 07/20/21   [provider]  aspirin  81 MG chewable tablet Chew 1 tablet (81 mg total) by mouth 2 (two) times daily. 04/15/22   Arnie Lao, MD  losartan -hydrochlorothiazide  (HYZAAR) 100-25 MG tablet Take 1 tablet by mouth daily. 03/16/20   [provider]  meclizine  (ANTIVERT ) 25 MG tablet  Take 1 tablet (25 mg total) by mouth 3 (three) times daily as needed for dizziness. 03/30/23   Orvilla Blander, MD  metFORMIN  (GLUCOPHAGE ) 500 MG tablet Take 500 mg by mouth daily with breakfast.     [provider]  methocarbamol  (ROBAXIN ) 500 MG tablet Take 1 tablet (500 mg total) by mouth every 6 (six) hours as needed for muscle spasms. 04/15/22   Arnie Lao, MD  oxyCODONE  (OXY IR/ROXICODONE ) 5 MG immediate release tablet Take 1-2 tablets (5-10 mg total) by mouth every 6 (six) hours as needed for moderate pain (pain score 4-6). 05/24/22   Arnie Lao, MD  OZEMPIC, 0.25 OR 0.5 MG/DOSE, 2 MG/3ML SOPN Subcutaneous for 42 Days    [provider]  rosuvastatin (CRESTOR) 10 MG tablet Take 10 mg by mouth every evening.    [provider]  Turmeric 400 MG CAPS Take 400 mg by mouth daily.    [provider]  Vitamin D, Ergocalciferol, (DRISDOL) 50000 units CAPS capsule Take 50,000 Units by mouth every Thursday. 08/06/17   [provider]      Allergies    Patient has no known allergies.    Review of Systems   Review of Systems  Physical Exam Updated Vital Signs BP (!) 149/63   Pulse (!) 57   Temp 97.9 F (36.6 C) (Oral)   Resp 11   SpO2 98%  Physical Exam Vitals and nursing note reviewed.  Constitutional:  Appearance: She is not ill-appearing or toxic-appearing.     Comments: Mildly uncomfortable  HENT:     Head: Normocephalic and atraumatic.     Right Ear: External ear normal.     Left Ear: External ear normal.     Nose: Nose normal.     Mouth/Throat:     Mouth: Mucous membranes are moist.  Eyes:     Extraocular Movements: Extraocular movements intact.  Cardiovascular:     Rate and Rhythm: Normal rate and regular rhythm.  Pulmonary:     Effort: Pulmonary effort is normal.     Breath sounds: Normal breath sounds.  Abdominal:     Palpations: Abdomen is soft.     Tenderness: There is no right CVA tenderness, left  CVA tenderness, guarding or rebound.  Musculoskeletal:     Right lower leg: No edema.     Left lower leg: No edema.  Skin:    General: Skin is warm and dry.  Neurological:     General: No focal deficit present.     Mental Status: She is alert.  Psychiatric:        Mood and Affect: Mood normal.        Behavior: Behavior normal.     ED Results / Procedures / Treatments   Labs (all labs ordered are listed, but only abnormal results are displayed) Labs Reviewed  LIPASE, BLOOD - Abnormal; Notable for the following components:      Result Value   Lipase 85 (*)    All other components within normal limits  COMPREHENSIVE METABOLIC PANEL WITH GFR - Abnormal; Notable for the following components:   Glucose, Bld 153 (*)    Creatinine, Ser 1.12 (*)    AST 103 (*)    ALT 46 (*)    GFR, Estimated 51 (*)    All other components within normal limits  CBC - Abnormal; Notable for the following components:   WBC 11.5 (*)    RBC 3.42 (*)    Hemoglobin 9.5 (*)    HCT 30.7 (*)    All other components within normal limits  URINALYSIS, ROUTINE W REFLEX MICROSCOPIC - Abnormal; Notable for the following components:   Protein, ur TRACE (*)    Leukocytes,Ua LARGE (*)    Bacteria, UA RARE (*)    All other components within normal limits  URINE CULTURE  TROPONIN T, HIGH SENSITIVITY    EKG EKG Interpretation Date/Time:  Thursday Mar 20 2024 10:02:00 EDT Ventricular Rate:  58 PR Interval:  259 QRS Duration:  91 QT Interval:  433 QTC Calculation: 426 R Axis:   36  Text Interpretation: Sinus rhythm Prolonged PR interval No significant change since last tracing Confirmed by Scarlette Currier ([id number]) on 03/20/2024 10:37:47 AM  Radiology CT Renal Stone Study Result Date: 03/20/2024 CLINICAL DATA:  Low back and flank pain with vomiting. History of kidney stones. History of renal cancer status post partial nephrectomy. EXAM: CT ABDOMEN AND PELVIS WITHOUT CONTRAST TECHNIQUE: Multidetector CT imaging  of the abdomen and pelvis was performed following the standard protocol without IV contrast. RADIATION DOSE REDUCTION: This exam was performed according to the departmental dose-optimization program which includes automated exposure control, adjustment of the mA and/or kV according to patient size and/or use of iterative reconstruction technique. COMPARISON:  01/11/2023 FINDINGS: Lower chest: No acute findings. Hepatobiliary: No suspicious focal abnormality in the liver on this study without intravenous contrast. Lobular liver contour raises the question of underlying cirrhosis. Left hepatic lobe lesion  described previously is less evident today on image 29/2 given lack of intravenous contrast material. This was previously characterized as hemangioma. There is no evidence for gallstones, gallbladder wall thickening, or pericholecystic fluid. No intrahepatic or extrahepatic biliary dilation. Pancreas: No focal mass lesion. No dilatation of the main duct. No intraparenchymal cyst. No peripancreatic edema. Spleen: No splenomegaly. No suspicious focal mass lesion. Adrenals/Urinary Tract: No adrenal nodule or mass. 2 mm nonobstructing stone identified lower pole right kidney. No stones are seen in the left kidney. 13 mm subcapsular lesion anterior interpolar right kidney was 9 mm previously. This has attenuation too high to be a simple cyst. Another 13 mm hyperattenuating subcapsular lesion anterior upper interpolar right kidney on 32/2 is new in the interval. 6 mm subcapsular lesion lower pole left kidney on 37/2 is similar in size to prior. No evidence for hydroureter. The urinary bladder appears normal for the degree of distention. Stomach/Bowel: Stomach is unremarkable. No gastric wall thickening. No evidence of outlet obstruction. Duodenum is normally positioned as is the ligament of Treitz. No small bowel wall thickening. No small bowel dilatation. No gross colonic mass. No colonic wall thickening. Diverticular  changes are noted in the left colon without evidence of diverticulitis. Vascular/Lymphatic: There is mild atherosclerotic calcification of the abdominal aorta without aneurysm. There is no gastrohepatic or hepatoduodenal ligament lymphadenopathy. No retroperitoneal or mesenteric lymphadenopathy. No pelvic sidewall lymphadenopathy. Reproductive: Hysterectomy.  There is no adnexal mass. Other: No intraperitoneal free fluid. Musculoskeletal: At least 2 supraumbilical midline ventral hernias are evident, containing only fat. Left lateral abdominal wall hernia contains only fat. Degenerative changes are noted in both hips. No worrisome lytic or sclerotic osseous abnormality. IMPRESSION: 1. No acute findings in the abdomen or pelvis. Specifically, no findings to explain the patient's history of flank pain and vomiting. 2. 2 mm nonobstructing stone lower pole right kidney. No stones in the left kidney. No hydronephrosis. 3. 13 mm subcapsular lesion anterior interpolar right kidney was 9 mm previously. This has attenuation too high to be a simple cyst. Another 13 mm hyperattenuating subcapsular lesion anterior upper interpolar right kidney is new in the interval. These may be hemorrhagic or proteinaceous cysts, but follow-up nonemergent abdominal MRI with and without contrast recommended to further evaluate. 4. 6 mm subcapsular lesion lower pole left kidney is similar in size to prior. 5. Lobular liver contour raises the question of underlying cirrhosis. Left hepatic lobe lesion described previously is less evident today given lack of intravenous contrast material. This was previously characterized as hemangioma. 6. Left colonic diverticulosis without diverticulitis. 7. At least 2 supraumbilical midline ventral hernias are evident, containing only fat. Left lateral abdominal wall hernia contains only fat. 8.  Aortic Atherosclerosis (ICD10-I70.0). Electronically Signed   By: Donnal Fusi M.D.   On: 03/20/2024 09:00     Procedures Procedures    Medications Ordered in ED Medications  morphine  (PF) 2 MG/ML injection 2 mg (2 mg Intravenous Given 03/20/24 0839)  ondansetron  (ZOFRAN ) injection 4 mg (4 mg Intravenous Given 03/20/24 0837)  alum & mag hydroxide-simeth (MAALOX/MYLANTA) 200-200-20 MG/5ML suspension 30 mL (30 mLs Oral Given 03/20/24 0934)  dicyclomine  (BENTYL ) capsule 10 mg (10 mg Oral Given 03/20/24 0934)  cephALEXin  (KEFLEX ) capsule 500 mg (500 mg Oral Given 03/20/24 1008)    ED Course/ Medical Decision Making/ A&P Clinical Course as of 03/20/24 1311  Thu Mar 20, 2024  0926 Comprehensive metabolic panel(!) Mildly elevated AST ALT, stable creatinine. [MQ]  0927 Lipase, blood(!) Lipase mildly elevated.  [  MQ]  0927 CBC(!) [MQ]  0928 CBC(!) Hgb stable around her baseline at 9.5. White blood cell count mildly elevated to 11.5. [MQ]  [id number] Urinalysis, Routine w reflex microscopic -Urine, Clean Catch(!) UA not a clear indication of infection with large leukocytes and 0-5 wbc, but significant bacteria present. With elevated WBC would favor urinary tract infection.  [MQ]  0930 CT Renal Stone Study [MQ]  0930 CT Renal Stone Study Multiple incidental findings that would not explain patient's left sided pain presentation. Will need PCP follow-up for incidental findings and will place in AVS. [MQ]  1018 ED EKG Personally reviewed, unchanged from previous. Borderline elevated PR interval, possible first degree heart block. No ST segment changes. [MQ]    Clinical Course User Index [MQ] Ivin Marrow, MD                                 Medical Decision Making Suspected patient has recurrent left-sided nephrolithiasis given clinical presentation, exam and previous history of renal stones.  I have low suspicion for other acute emergent pathology such as pyelonephritis, cholecystitis, appendicitis, bowel obstruction, aortic dissection, ACS, or PE.  She is hemodynamically stable.  Will evaluate with UA,  CMP, CBC, lipase and CT renal study.  2 mg morphine  ordered for pain control.  On reevaluation pain and nausea mildly improved after Zofran  and morphine .  Patient states pain still feels like gas pain.  Given unclear results of UA and elevated white blood cell count would favor urinary tract infection as the cause of patient's symptoms; however, this is not a clear clinical picture.  Will further evaluate for abnormal causes with EKG and troponin.  Trial Maalox and Bentyl for possible component of gas pain.  EKG and troponin reassuring. ON reevaluation patient improved with Maalox. Patient stable for discharge. Discussed concern for cirrhosis and other incidental findings with patient on CT Abdomen. Recommended she see her primary care doctor within the week. Will treat for urinary tract infection given elevated WBC and questionable UA with symptoms present. One dose of Keflex  given, 7 day course 500mg  BID sent to patient's pharmacy. Additionally, add tizanidine for possible MSK component of pain. Zofran  prn ordered as well. Strict return precautions discussed for worsening pain, fever, etc. Patient agreeable to plan.  Amount and/or Complexity of Data Reviewed Labs: ordered. Decision-making details documented in ED Course. Radiology: ordered. Decision-making details documented in ED Course. ECG/medicine tests:  Decision-making details documented in ED Course.  Risk OTC drugs. Prescription drug management.          Final Clinical Impression(s) / ED Diagnoses Final diagnoses:  Left flank pain  Urinary tract infection without hematuria, site unspecified    Rx / DC Orders ED Discharge Orders          Ordered    cephALEXin  (KEFLEX ) 500 MG capsule  2 times daily        03/20/24 1002    ondansetron  (ZOFRAN -ODT) 4 MG disintegrating tablet  Every 8 hours PRN        03/20/24 1002    tiZANidine (ZANAFLEX) 4 MG tablet  Every 8 hours PRN        03/20/24 1002              Ivin Marrow, MD 03/20/24 1311    Scarlette Currier, MD 03/20/24 2259

## 2024-03-20 NOTE — ED Notes (Signed)
 DC paperwork given and verbally understood.

## 2024-03-20 NOTE — ED Triage Notes (Signed)
 C/o lower back and flank pain w/ emesis starting this morning around 0530. Hx of kidney stones.

## 2024-03-21 LAB — URINE CULTURE: Culture: <10000 — AB

## 2024-08-25 ENCOUNTER — Encounter: Payer: Self-pay | Admitting: Radiology
